# Patient Record
Sex: Male | Born: 1945
Health system: Southern US, Community
[De-identification: ages and names within clinical notes are randomized; demographics above are authoritative.]

## PROBLEM LIST (undated history)

## (undated) DIAGNOSIS — K219 Gastro-esophageal reflux disease without esophagitis: Secondary | ICD-10-CM

## (undated) DIAGNOSIS — I2510 Atherosclerotic heart disease of native coronary artery without angina pectoris: Secondary | ICD-10-CM

## (undated) DIAGNOSIS — I219 Acute myocardial infarction, unspecified: Secondary | ICD-10-CM

## (undated) DIAGNOSIS — Z972 Presence of dental prosthetic device (complete) (partial): Secondary | ICD-10-CM

## (undated) DIAGNOSIS — N400 Enlarged prostate without lower urinary tract symptoms: Secondary | ICD-10-CM

## (undated) DIAGNOSIS — R0600 Dyspnea, unspecified: Secondary | ICD-10-CM

## (undated) DIAGNOSIS — I10 Essential (primary) hypertension: Secondary | ICD-10-CM

## (undated) DIAGNOSIS — M1990 Unspecified osteoarthritis, unspecified site: Secondary | ICD-10-CM

## (undated) HISTORY — DX: Essential (primary) hypertension: I10

## (undated) HISTORY — PX: EYE SURGERY: SHX253

## (undated) HISTORY — DX: Gastro-esophageal reflux disease without esophagitis: K21.9

---

## 2008-12-29 ENCOUNTER — Ambulatory Visit: Payer: Self-pay | Admitting: Family Medicine

## 2011-06-07 ENCOUNTER — Ambulatory Visit: Payer: Self-pay | Admitting: Internal Medicine

## 2011-06-16 ENCOUNTER — Ambulatory Visit: Payer: Self-pay | Admitting: Internal Medicine

## 2012-03-19 ENCOUNTER — Ambulatory Visit: Payer: Self-pay

## 2012-08-21 ENCOUNTER — Ambulatory Visit: Payer: Self-pay | Admitting: Specialist

## 2012-09-11 ENCOUNTER — Ambulatory Visit: Payer: Self-pay | Admitting: Specialist

## 2012-09-21 ENCOUNTER — Ambulatory Visit: Payer: Self-pay | Admitting: Specialist

## 2012-09-21 HISTORY — PX: KNEE SURGERY: SHX244

## 2013-07-18 ENCOUNTER — Ambulatory Visit: Payer: Self-pay | Admitting: Unknown Physician Specialty

## 2013-11-08 ENCOUNTER — Ambulatory Visit: Payer: Self-pay | Admitting: Medical

## 2014-05-16 IMAGING — CR LEFT LITTLE FINGER 2+V
1 series · 3 of 3 positions shown · non-contrast
Comparison: none

REASON FOR EXAM: Screen for Metal prior to MRI
COMMENTS:

PROCEDURE:     DXR - DXR FINGER PINKY 5TH DIGIT LT HA  - August 21, 2012 [DATE]
RESULT:     Comparison:  None

[Series 1: x finger pa left · 0.14mm/px · 3 of 3 slices shown]
[im 1/3]
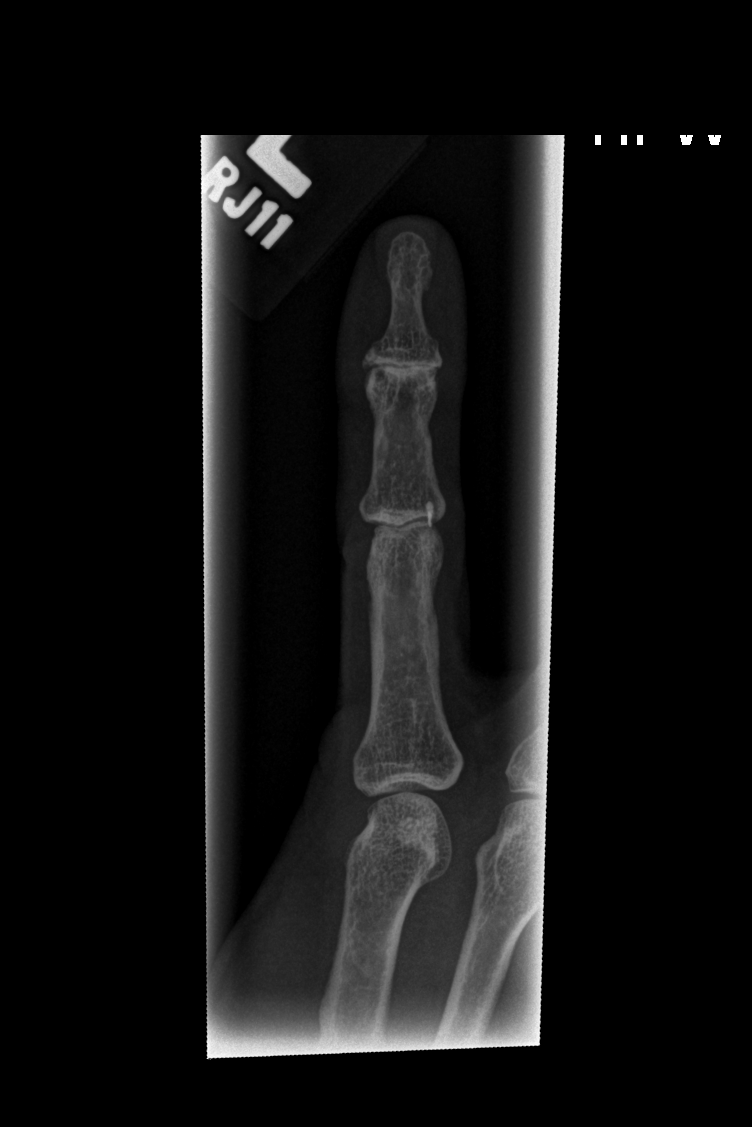
[im 2/3]
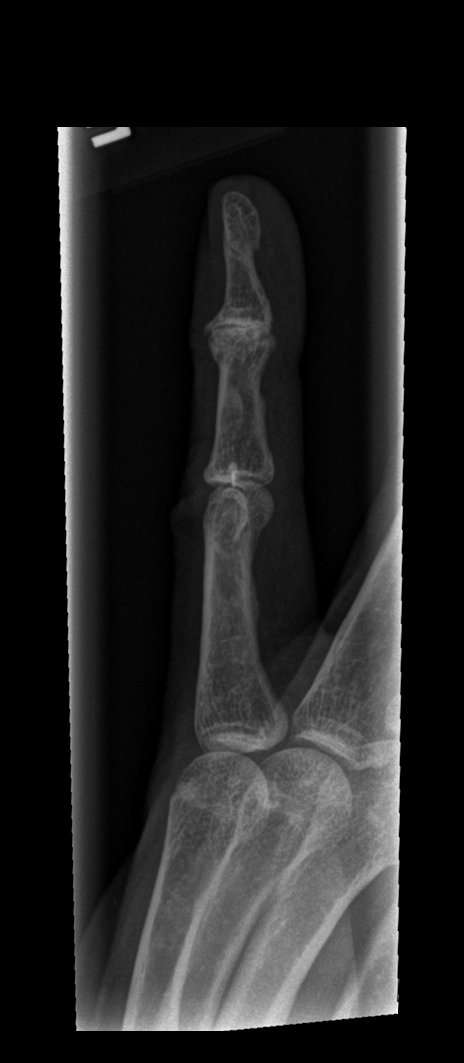
[im 3/3]
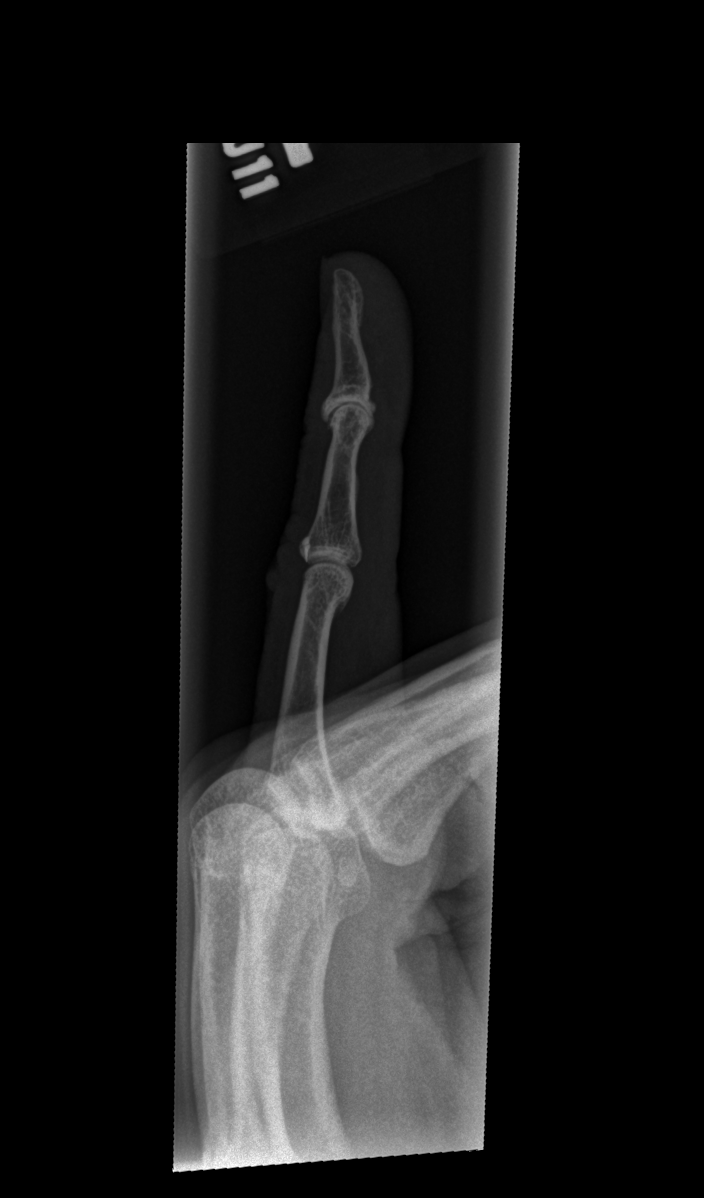

[3 of 3 positions shown; findings below may reference images not displayed]

FINDINGS: Three coned-down views of the left fifth digit demonstrates no fracture or
dislocation. There is a tiny metallic foreign body along the dorsal aspect
of the left fifth PIP joint which should not preclude the patient from MRI.
There are degenerative changes of the DIP joint.
IMPRESSION: Please see above.

[REDACTED]

## 2015-02-03 NOTE — Op Note (Signed)
PATIENT NAME:  Barry [name], Barry [name] MR#:  [id number] DATE OF BIRTH:  04-Aug-1946  DATE OF PROCEDURE:  09/21/2012  PREOPERATIVE DIAGNOSIS: Tear posterior horn medial meniscus, right knee.   POSTOPERATIVE DIAGNOSIS: Tear posterior horn medial meniscus, right knee.   PROCEDURE: Arthroscopic partial medial meniscectomy, right knee.   SURGEON: Christophe Louis, M.D.   ANESTHESIA: General.   COMPLICATIONS: None.   PROCEDURE: After adequate induction of general anesthesia, the right lower extremity is secured in the legholder in the usual manner for arthroscopy. The knee and leg are thoroughly prepped with alcohol and ChloraPrep and draped in standard sterile fashion. The joint is infiltrated with 10 mL of Marcaine with epinephrine. Diagnostic arthroscopy is performed. There is a moderate joint effusion present. The suprapatellar pouch shows some mild increased synovitis and this is removed with the 50-degree ArthroWand. The patellofemoral articulation is normal. In the medial compartment there is significant chondromalacia of the weight-bearing surface of the medial femoral condyle. There is a complex tear of the posterior horn of the medial meniscus. The probe is used to fully define the tear. The full radial resector and the ArthroWand are used to resect the meniscus back to a stable rim. In the intercondylar notch, there is increased synovial hypertrophy and this synovium is removed. The anterior cruciate ligament is normal. The lateral compartment is normal. The joint is thoroughly irrigated multiple times. Skin edges are closed with 5-0 nylon. The joint is infiltrated with 10 mL of Marcaine with epinephrine with 4 mg of morphine. A soft bulky dressing is applied. The patient is returned to the recovery room in satisfactory condition having tolerated the procedure quite well.    ____________________________ Lucas Mallow, MD ces:bjt D:  09/21/2012 09:21:51 ET           T: 09/21/2012 10:27:09  ET JOB#: [id number] Lucas Mallow MD ELECTRONICALLY SIGNED 09/22/2012 11:09

## 2015-02-13 ENCOUNTER — Ambulatory Visit
Admit: 2015-02-13 | Disposition: A | Discharge status: Home / Self Care | Payer: Self-pay | Attending: Family Medicine | Admitting: Family Medicine

## 2015-02-13 LAB — URINALYSIS, COMPLETE
BACTERIA: NEGATIVE
BILIRUBIN, UR: NEGATIVE
Dysmorphic Rbc: NONE SEEN
Glucose,UR: NEGATIVE
Ketone: NEGATIVE
Leukocyte Esterase: NEGATIVE
NITRITE: NEGATIVE
Ph: 5 (ref 5.0–8.0)
Protein: NEGATIVE
Renal Epithelial: NONE SEEN
SQUAMOUS EPITHELIAL: NONE SEEN
TRANSITIONAL EPI: NONE SEEN
WBC CLUMP: NONE SEEN

## 2015-02-16 DIAGNOSIS — R319 Hematuria, unspecified: Secondary | ICD-10-CM | POA: Insufficient documentation

## 2015-02-16 DIAGNOSIS — N401 Enlarged prostate with lower urinary tract symptoms: Secondary | ICD-10-CM | POA: Insufficient documentation

## 2015-02-16 DIAGNOSIS — R3913 Splitting of urinary stream: Secondary | ICD-10-CM | POA: Insufficient documentation

## 2015-02-16 DIAGNOSIS — R3911 Hesitancy of micturition: Secondary | ICD-10-CM | POA: Insufficient documentation

## 2015-04-08 ENCOUNTER — Encounter: Payer: Self-pay | Admitting: Family Medicine

## 2015-04-08 ENCOUNTER — Ambulatory Visit: Payer: Self-pay | Admitting: Family Medicine

## 2015-04-08 ENCOUNTER — Ambulatory Visit (INDEPENDENT_AMBULATORY_CARE_PROVIDER_SITE_OTHER): Payer: PPO | Admitting: Family Medicine

## 2015-04-08 VITALS — BP 120/65 | HR 67 | Temp 98.0°F | Resp 16 | Ht 69.0 in | Wt 184.4 lb

## 2015-04-08 DIAGNOSIS — K649 Unspecified hemorrhoids: Secondary | ICD-10-CM | POA: Insufficient documentation

## 2015-04-08 DIAGNOSIS — N410 Acute prostatitis: Secondary | ICD-10-CM | POA: Insufficient documentation

## 2015-04-08 DIAGNOSIS — H6120 Impacted cerumen, unspecified ear: Secondary | ICD-10-CM | POA: Insufficient documentation

## 2015-04-08 DIAGNOSIS — K219 Gastro-esophageal reflux disease without esophagitis: Secondary | ICD-10-CM | POA: Insufficient documentation

## 2015-04-08 DIAGNOSIS — I10 Essential (primary) hypertension: Secondary | ICD-10-CM | POA: Diagnosis not present

## 2015-04-08 DIAGNOSIS — E1169 Type 2 diabetes mellitus with other specified complication: Secondary | ICD-10-CM | POA: Insufficient documentation

## 2015-04-08 DIAGNOSIS — R7309 Other abnormal glucose: Secondary | ICD-10-CM

## 2015-04-08 DIAGNOSIS — E785 Hyperlipidemia, unspecified: Secondary | ICD-10-CM

## 2015-04-08 DIAGNOSIS — R7303 Prediabetes: Secondary | ICD-10-CM

## 2015-04-08 DIAGNOSIS — R600 Localized edema: Secondary | ICD-10-CM

## 2015-04-08 DIAGNOSIS — H9190 Unspecified hearing loss, unspecified ear: Secondary | ICD-10-CM | POA: Insufficient documentation

## 2015-04-08 DIAGNOSIS — Z8042 Family history of malignant neoplasm of prostate: Secondary | ICD-10-CM | POA: Insufficient documentation

## 2015-04-08 MED ORDER — AMLODIPINE BESYLATE 5 MG PO TABS
5.0000 mg | ORAL_TABLET | Freq: Every day | ORAL | Status: DC
Start: 2015-04-08 — End: 2015-06-16

## 2015-04-08 MED ORDER — ATENOLOL 50 MG PO TABS: ORAL_TABLET | ORAL | Status: DC

## 2015-04-08 NOTE — Progress Notes (Signed)
Name: Barry [name]   MRN: [id number]    DOB: 06-02-1946   Date:04/08/2015       Progress Note  Subjective  Chief Complaint  Chief Complaint  Patient presents with  . Hypertension    Leg swelling x 4 days    HPI  Feet swelling for abut 10 days.  Amlodipine dose increased abut 6 weeks ago.  Feels lightheaded still, for a sec. Or 2 after standing.  Otgher wise feels ok. Found to be pre-diabetic on labs. Found to have microscopic hematuria by Dr. Bernardo Heater.  Needs f/u at his PE in July.   Past Medical History  Diagnosis Date  . Hypertension   . Hyperlipidemia   . GERD (gastroesophageal reflux disease)     History  Substance Use Topics  . Smoking status: Never Smoker   . Smokeless tobacco: Former Systems developer  . Alcohol Use: No     Current outpatient prescriptions:  .  aspirin 81 MG chewable tablet, Chew by mouth., Disp: , Rfl:  .  atenolol (TENORMIN) 50 MG tablet, Take 1/2 tablet each AM, Disp: 90 tablet, Rfl: 3 .  MULTIPLE VITAMIN PO, Take by mouth., Disp: , Rfl:  .  ranitidine (ZANTAC) 150 MG capsule, Take by mouth., Disp: , Rfl:  .  silodosin (RAPAFLO) 8 MG CAPS capsule, Take by mouth., Disp: , Rfl:  .  simvastatin (ZOCOR) 20 MG tablet, Take by mouth., Disp: , Rfl:  .  amLODipine (NORVASC) 5 MG tablet, Take 1 tablet (5 mg total) by mouth daily., Disp: 90 tablet, Rfl: 3  No Known Allergies  Review of Systems  Constitutional: Negative.  Negative for fever, chills and malaise/fatigue.  HENT: Negative.   Eyes: Negative.   Respiratory: Negative for cough, sputum production, shortness of breath and wheezing.   Cardiovascular: Positive for leg swelling. Negative for chest pain, palpitations and orthopnea.  Gastrointestinal: Negative.  Negative for heartburn and abdominal pain.  Genitourinary: Hematuria: microscopic.  Musculoskeletal: Negative.  Negative for myalgias and joint pain.  Skin: Negative.   Neurological: Negative for weakness and headaches.       Objective  Filed Vitals:   04/08/15 1010 04/08/15 1044  BP: 127/72 120/65  Pulse: 67   Temp: 98 F (36.7 C)   Resp: 16   Height: 5\' 9"  (1.753 m)   Weight: 184 lb 6.4 oz (83.643 kg)      Physical Exam  Constitutional: He is well-developed, well-nourished, and in no distress. No distress.  HENT:  Head: Normocephalic and atraumatic.  Neck: Normal range of motion. Neck supple. No thyromegaly present.  Cardiovascular: Normal rate, regular rhythm, normal heart sounds and intact distal pulses.  Exam reveals no gallop and no friction rub.   No murmur heard. Pulmonary/Chest: Effort normal and breath sounds normal. No respiratory distress. He has no wheezes. He has no rales.  Musculoskeletal: He exhibits edema.       Right ankle: He exhibits swelling.       Left ankle: He exhibits swelling.       Right lower leg: He exhibits swelling.       Left lower leg: He exhibits swelling.       Right foot: There is swelling.       Left foot: There is swelling.  Lymphadenopathy:    He has no cervical adenopathy.  Vitals reviewed.     Recent Results (from the past 2160 hour(s))  Urinalysis, Complete     Status: None   Collection Time: 02/13/15  1:56  PM  Result Value Ref Range   Color - urine YELLOW    Clarity - urine CLEAR    Glucose,UR NEGATIVE NEGATIVE   Bilirubin,UR NEGATIVE NEGATIVE   Ketone NEGATIVE NEGATIVE   Specific Gravity >=1.030 1.000-1.030   Blood TRACE NEGATIVE   Ph 5.0 5.0-8.0   Protein NEGATIVE NEGATIVE   Nitrite NEGATIVE NEGATIVE   Leukocyte Esterase NEGATIVE NEGATIVE   RBC,UR 0-5 0-5 /HPF   WBC UR 5-15 0-5 /HPF   Bacteria NEGATIVE NONE SEEN   Squamous Epithelial NONE SEEN    Transitional Epi NONE SEEN    Renal Epithelial NONE SEEN    WBC Clump NONE SEEN    Dysmorphic Rbc NONE SEEN    Mucous PRESENT    Hyaline Cast 0-5 / LPF    Amorphous Crystal PRESENT      Assessment & Plan    1. Pre-diabetes Decrease sugars and starches in diet.  2.  Essential (primary) hypertension  - atenolol (TENORMIN) 50 MG tablet; Take 1/2 tablet each AM  Dispense: 90 tablet; Refill: 3 - amLODipine (NORVASC) 5 MG tablet; Take 1 tablet (5 mg total) by mouth daily.  Dispense: 90 tablet; Refill: 3  3. Pedal edema

## 2015-05-01 ENCOUNTER — Ambulatory Visit: Payer: Self-pay | Admitting: Family Medicine

## 2015-05-19 ENCOUNTER — Ambulatory Visit (INDEPENDENT_AMBULATORY_CARE_PROVIDER_SITE_OTHER): Payer: PPO | Admitting: Family Medicine

## 2015-05-19 ENCOUNTER — Encounter: Payer: Self-pay | Admitting: Family Medicine

## 2015-05-19 VITALS — BP 136/72 | HR 65 | Resp 16 | Ht 69.0 in | Wt 182.6 lb

## 2015-05-19 DIAGNOSIS — Z0000 Encounter for general adult medical examination without abnormal findings: Secondary | ICD-10-CM

## 2015-05-19 NOTE — Progress Notes (Signed)
Subjective:    Barry [name] is a 69 y.o. male who presents for Medicare Annual/Subsequent preventive examination.   Preventive Screening-Counseling & Management  Tobacco History  Smoking status  . Never Smoker   Smokeless tobacco  . Former Systems developer  . Types: Chew    Problems Prior to Visit 1.   Current Problems (verified) Patient Active Problem List   Diagnosis Date Noted  . Acute prostatitis 04/08/2015  . Cerumen impaction 04/08/2015  . Essential (primary) hypertension 04/08/2015  . Difficulty hearing 04/08/2015  . Gastric reflux 04/08/2015  . Family history of malignant neoplasm of prostate 04/08/2015  . Hemorrhoid 04/08/2015  . Hypercholesteremia 04/08/2015  . HLD (hyperlipidemia) 04/08/2015  . Blood glucose elevated 04/08/2015  . Pre-diabetes 04/08/2015  . Pedal edema 04/08/2015  . Blood in the urine 02/16/2015    Medications Prior to Visit Current Outpatient Prescriptions on File Prior to Visit  Medication Sig Dispense Refill  . amLODipine (NORVASC) 5 MG tablet Take 1 tablet (5 mg total) by mouth daily. 90 tablet 3  . aspirin 81 MG chewable tablet Chew by mouth.    Marland Kitchen atenolol (TENORMIN) 50 MG tablet Take 1/2 tablet each AM 90 tablet 3  . MULTIPLE VITAMIN PO Take by mouth.    . ranitidine (ZANTAC) 150 MG capsule Take by mouth.    . silodosin (RAPAFLO) 8 MG CAPS capsule Take by mouth.    . simvastatin (ZOCOR) 20 MG tablet Take by mouth.     No current facility-administered medications on file prior to visit.    Current Medications (verified) Current Outpatient Prescriptions  Medication Sig Dispense Refill  . amLODipine (NORVASC) 5 MG tablet Take 1 tablet (5 mg total) by mouth daily. 90 tablet 3  . aspirin 81 MG chewable tablet Chew by mouth.    Marland Kitchen atenolol (TENORMIN) 50 MG tablet Take 1/2 tablet each AM 90 tablet 3  . MULTIPLE VITAMIN PO Take by mouth.    . ranitidine (ZANTAC) 150 MG capsule Take by mouth.    . silodosin (RAPAFLO) 8 MG CAPS capsule  Take by mouth.    . simvastatin (ZOCOR) 20 MG tablet Take by mouth.     No current facility-administered medications for this visit.     Allergies (verified) Review of patient's allergies indicates no known allergies.   PAST HISTORY  Family History Family History  Problem Relation Age of Onset  . Diabetes Mother   . Heart attack Mother   . Cancer Brother 69    prostate   . Hyperlipidemia Paternal Aunt     Social History History  Substance Use Topics  . Smoking status: Never Smoker   . Smokeless tobacco: Former Systems developer    Types: Chew  . Alcohol Use: No    Are there smokers in your home (other than you)? No Risk Factors Current exercise habits: Exercise is limited by orthopedic condition(s): knee pain.  Dietary issues discussed: None   Cardiac risk factors: advanced age (older than 14 for men, 23 for women), hypertension, male gender and sedentary lifestyle.  Depression Screen (Note: if answer to either of the following is "Yes", a more complete depression screening is indicated)   Q1: Over the past two weeks, have you felt down, depressed or hopeless? No  Q2: Over the past two weeks, have you felt little interest or pleasure in doing things? NO  Have you lost interest or pleasure in daily life? No  Do you often feel hopeless? No  Do you cry easily over  simple problems? No  Activities of Daily Living In your present state of health, do you have any difficulty performing the following activities?:  Driving?No Managing money?  No Feeding yourself? No Getting from bed to chair? No Climbing a flight of stairs? Yes Preparing food and eating?: No Bathing or showering? No Getting dressed: No Getting to the toilet? No Using the toilet:No Moving around from place to place: No In the past year have you fallen or had a near fall?:No   Are you sexually active?  Yes  Do you have more than one partner?  No  Hearing Difficulties: No Do you often ask people to speak up or  repeat themselves? No Do you experience ringing or noises in your ears? No Do you have difficulty understanding soft or whispered voices? No   Do you feel that you have a problem with memory? No  Do you often misplace items? No  Do you feel safe at home?  No  Cognitive Testing  Alert? No  Normal Appearance?No  Oriented to person? No  Place? No   Time? No  Recall of three objects?  No  Can perform simple calculations? No  Displays appropriate judgment?No  Can read the correct time from a watch face?No   Advanced Directives have been discussed with the patient? No   List the Names of Other Physician/Practitioners you currently use: 1. Dr. Ellin Mayhew- Eye Doctor   Indicate any recent Medical Services you may have received from other than [facility] providers in the past year (date may be approximate).  Immunization History  Administered Date(s) Administered  . Influenza-Unspecified 06/17/2014  . Pneumococcal Conjugate-13 04/16/2014  . Pneumococcal-Unspecified 09/17/2011  . Tdap 05/17/2008    Screening Tests Health Maintenance  Topic Date Due  . PNA vac Low Risk Adult (2 of 2 - PPSV23) 05/08/2015  . INFLUENZA VACCINE  05/18/2015  . TETANUS/TDAP  10/17/2017  . COLONOSCOPY  06/29/2024  . ZOSTAVAX  Completed    All answers were reviewed with the patient and necessary referrals were made:  Shalayne Leach L Hadas Jessop, NP   05/19/2015   History reviewed: past family history, past social history and past surgical history  Review of Systems A comprehensive review of systems was negative except for: Cardiovascular: positive for ankle swelling.    Objective:     Vision by Snellen chart: right eye:20/25, left eye:20/20 Blood pressure 136/72, pulse 65, resp. rate 16, height 5\' 9"  (1.753 m), weight 182 lb 9.6 oz (82.827 kg). Body mass index is 26.95 kg/(m^2).       Assessment:     Subsequent Annual Medicare Visit      Plan:     During the course of the visit the patient was educated and  counseled about appropriate screening and preventive services including:    Colorectal cancer screening  Diabetes screening  Advanced directives: has NO advanced directive  - add't info requested. Referral to SW: not applicable  Diet review for nutrition referral? Yes ____  Not Indicated ____   Patient Instructions (the written plan) was given to the patient.  Medicare Attestation I have personally reviewed: The patient's medical and social history Their use of alcohol, tobacco or illicit drugs Their current medications and supplements The patient's functional ability including ADLs,fall risks, home safety risks, cognitive, and hearing and visual impairment Diet and physical activities Evidence for depression or mood disorders  The patient's weight, height, BMI, and visual acuity have been recorded in the chart.  I have made referrals, counseling,  and provided education to the patient based on review of the above and I have provided the patient with a written personalized care plan for preventive services.     Korby Ratay L Suhaib Guzzo, NP   05/19/2015

## 2015-05-19 NOTE — Patient Instructions (Addendum)

## 2015-06-16 ENCOUNTER — Ambulatory Visit (INDEPENDENT_AMBULATORY_CARE_PROVIDER_SITE_OTHER): Payer: PPO | Admitting: Family Medicine

## 2015-06-16 ENCOUNTER — Encounter: Payer: Self-pay | Admitting: Family Medicine

## 2015-06-16 VITALS — BP 150/75 | HR 66 | Temp 97.8°F | Resp 16 | Ht 69.0 in | Wt 183.0 lb

## 2015-06-16 DIAGNOSIS — I10 Essential (primary) hypertension: Secondary | ICD-10-CM

## 2015-06-16 MED ORDER — HYDROCHLOROTHIAZIDE 12.5 MG PO TABS: 12.5000 mg | ORAL_TABLET | Freq: Every day | ORAL | Status: DC

## 2015-06-16 NOTE — Progress Notes (Signed)
Name: Barry [name]   MRN: [id number]    DOB: 03/25/1946   Date:06/16/2015       Progress Note  Subjective  Chief Complaint  Chief Complaint  Patient presents with  . Hypertension    Pt c/o of getting very hot.     HPI  For f/u of HBP.  Ankle swelling has resolved off Amlodipine.  Still taking Atenolol, Simvastatin, and  Rapaflo.  Urind flo ok.  No problems noted.  Reports BPs at home 120-130s/75-80 No problem-specific assessment & plan notes found for this encounter.   Past Medical History  Diagnosis Date  . Hypertension   . Hyperlipidemia   . GERD (gastroesophageal reflux disease)     Social History  Substance Use Topics  . Smoking status: Never Smoker   . Smokeless tobacco: Former Systems developer    Types: Chew  . Alcohol Use: No     Current outpatient prescriptions:  .  aspirin 81 MG tablet, Take 81 mg by mouth 2 (two) times daily., Disp: , Rfl:  .  atenolol (TENORMIN) 50 MG tablet, Take 1/2 tablet each AM, Disp: 90 tablet, Rfl: 3 .  MULTIPLE VITAMIN PO, Take by mouth., Disp: , Rfl:  .  ranitidine (ZANTAC) 150 MG capsule, Take 150 mg by mouth daily as needed. , Disp: , Rfl:  .  silodosin (RAPAFLO) 8 MG CAPS capsule, Take by mouth., Disp: , Rfl:  .  simvastatin (ZOCOR) 20 MG tablet, Take 20 mg by mouth daily. , Disp: , Rfl:   No Known Allergies  Review of Systems  Constitutional: Negative for fever, chills, weight loss and malaise/fatigue.  HENT: Negative for hearing loss.   Eyes: Negative for blurred vision and double vision.  Respiratory: Negative for cough, sputum production, shortness of breath and wheezing.   Cardiovascular: Negative for chest pain, palpitations, orthopnea and leg swelling.  Gastrointestinal: Negative for heartburn, nausea, vomiting, abdominal pain, diarrhea and blood in stool.  Genitourinary: Negative for dysuria, urgency and frequency.  Skin: Negative for rash.  Neurological: Negative for dizziness and headaches.      Objective  Filed  Vitals:   06/16/15 1116  BP: 154/82  Pulse: 78  Temp: 97.8 F (36.6 C)  TempSrc: Oral  Resp: 16  Height: 5\' 9"  (1.753 m)  Weight: 183 lb (83.008 kg)     Physical Exam  Constitutional: He is well-developed, well-nourished, and in no distress. No distress.  HENT:  Head: Normocephalic and atraumatic.  Eyes: Conjunctivae and EOM are normal. Pupils are equal, round, and reactive to light. No scleral icterus.  Neck: Normal range of motion. Neck supple. Carotid bruit is not present. No thyromegaly present.  Cardiovascular: Normal rate, regular rhythm, normal heart sounds and intact distal pulses.  Exam reveals no gallop and no friction rub.   No murmur heard. Pulmonary/Chest: Effort normal and breath sounds normal. No respiratory distress. He has no wheezes. He has no rales.  Abdominal: Soft. Bowel sounds are normal. He exhibits no distension and no mass. There is no tenderness.  Musculoskeletal: He exhibits no edema.  Lymphadenopathy:    He has no cervical adenopathy.  Vitals reviewed.     No results found for this or any previous visit (from the past 2160 hour(s)).   Assessment & Plan  1. Essential (primary) hypertension  - hydrochlorothiazide (HYDRODIURIL) 12.5 MG tablet; Take 1 tablet (12.5 mg total) by mouth daily.  Dispense: 30 tablet; Refill: 6

## 2015-07-28 ENCOUNTER — Ambulatory Visit (INDEPENDENT_AMBULATORY_CARE_PROVIDER_SITE_OTHER): Payer: PPO | Admitting: Family Medicine

## 2015-07-28 ENCOUNTER — Encounter: Payer: Self-pay | Admitting: Family Medicine

## 2015-07-28 VITALS — BP 125/75 | HR 66 | Resp 16 | Ht 69.0 in | Wt 187.0 lb

## 2015-07-28 DIAGNOSIS — K219 Gastro-esophageal reflux disease without esophagitis: Secondary | ICD-10-CM | POA: Diagnosis not present

## 2015-07-28 DIAGNOSIS — I10 Essential (primary) hypertension: Secondary | ICD-10-CM

## 2015-07-28 DIAGNOSIS — Z23 Encounter for immunization: Secondary | ICD-10-CM | POA: Diagnosis not present

## 2015-07-28 DIAGNOSIS — R61 Generalized hyperhidrosis: Secondary | ICD-10-CM | POA: Diagnosis not present

## 2015-07-28 DIAGNOSIS — E785 Hyperlipidemia, unspecified: Secondary | ICD-10-CM

## 2015-07-28 DIAGNOSIS — IMO0001 Reserved for inherently not codable concepts without codable children: Secondary | ICD-10-CM

## 2015-07-28 NOTE — Progress Notes (Signed)
Name: Barry [name]   MRN: [id number]    DOB: 02-03-1946   Date:07/28/2015       Progress Note  Subjective  Chief Complaint  Chief Complaint  Patient presents with  . Hypertension    Dizzy and Sweating     HPI  Here for f/u of HBP.  Feeling well.  Taking meds.    No problem-specific assessment & plan notes found for this encounter.   Past Medical History  Diagnosis Date  . Hypertension   . Hyperlipidemia   . GERD (gastroesophageal reflux disease)     Social History  Substance Use Topics  . Smoking status: Never Smoker   . Smokeless tobacco: Former Systems developer    Types: Chew  . Alcohol Use: No     Current outpatient prescriptions:  .  atenolol (TENORMIN) 50 MG tablet, Take 1/2 tablet each AM, Disp: 90 tablet, Rfl: 3 .  hydrochlorothiazide (HYDRODIURIL) 12.5 MG tablet, Take 1 tablet (12.5 mg total) by mouth daily., Disp: 30 tablet, Rfl: 6 .  MULTIPLE VITAMIN PO, Take by mouth., Disp: , Rfl:  .  ranitidine (ZANTAC) 150 MG capsule, Take 150 mg by mouth daily as needed. , Disp: , Rfl:  .  silodosin (RAPAFLO) 8 MG CAPS capsule, Take by mouth., Disp: , Rfl:  .  simvastatin (ZOCOR) 20 MG tablet, Take 20 mg by mouth daily. , Disp: , Rfl:  .  aspirin 81 MG tablet, Take 81 mg by mouth 2 (two) times daily., Disp: , Rfl:   No Known Allergies  Review of Systems  Constitutional: Negative for fever, chills, weight loss and malaise/fatigue.  HENT: Negative for hearing loss.   Eyes: Negative for blurred vision and double vision.  Respiratory: Negative for cough, shortness of breath and wheezing.   Cardiovascular: Negative for chest pain, palpitations, orthopnea and leg swelling.  Gastrointestinal: Negative for heartburn, abdominal pain and blood in stool.  Genitourinary: Negative for dysuria, urgency and frequency.  Musculoskeletal: Negative for myalgias and joint pain.  Neurological: Negative for dizziness, tremors, weakness and headaches.      Objective  Filed Vitals:   07/28/15 1118 07/28/15 1146  BP: 162/93 138/77  Pulse: 66   Resp: 16   Height: 5\' 9"  (1.753 m)   Weight: 187 lb (84.823 kg)      Physical Exam  Constitutional: He is oriented to person, place, and time and well-developed, well-nourished, and in no distress. No distress.  HENT:  Head: Normocephalic and atraumatic.  Eyes: Conjunctivae and EOM are normal. Pupils are equal, round, and reactive to light. No scleral icterus.  Neck: Normal range of motion. Carotid bruit is not present. No thyromegaly present.  Cardiovascular: Normal rate, regular rhythm, normal heart sounds and intact distal pulses.  Exam reveals no gallop and no friction rub.   No murmur heard. Pulmonary/Chest: Effort normal and breath sounds normal. No respiratory distress. He has no wheezes. He has no rales.  Abdominal: Soft. Bowel sounds are normal. He exhibits no distension and no abdominal bruit. There is no tenderness. There is no rebound.  Musculoskeletal: He exhibits no edema.  Lymphadenopathy:    He has no cervical adenopathy.  Neurological: He is alert and oriented to person, place, and time.  Vitals reviewed.     No results found for this or any previous visit (from the past 2160 hour(s)).   Assessment & Plan  1. Essential (primary) hypertension  - Comprehensive Metabolic Panel (CMET)  2. Gastric reflux  - CBC with Differential  3. HLD (hyperlipidemia)  - Lipid Profile  4. Sweating  - TSH  5. Need for influenza vaccination  - Flu vaccine HIGH DOSE PF (Fluzone High dose)

## 2015-07-28 NOTE — Patient Instructions (Signed)
Continue current meds 

## 2015-09-16 ENCOUNTER — Other Ambulatory Visit: Payer: Self-pay | Admitting: Family Medicine

## 2015-09-16 ENCOUNTER — Telehealth: Payer: Self-pay | Admitting: Family Medicine

## 2015-09-16 MED ORDER — SIMVASTATIN 20 MG PO TABS: 20.0000 mg | ORAL_TABLET | Freq: Every day | ORAL | Status: DC

## 2015-09-16 NOTE — Telephone Encounter (Signed)
Pt have called back request that his med  simvastain be for a 90 day supply not  30 days

## 2015-09-16 NOTE — Telephone Encounter (Signed)
I have refilled.-jh 

## 2015-09-16 NOTE — Telephone Encounter (Signed)
Done

## 2015-09-16 NOTE — Telephone Encounter (Signed)
Done. Sent to pharmacy on file. Pt made aware.

## 2015-09-16 NOTE — Telephone Encounter (Signed)
Pt called need a refill on  Simvastain 20 mg  [employer]

## 2015-11-23 DIAGNOSIS — Z79899 Other long term (current) drug therapy: Secondary | ICD-10-CM | POA: Diagnosis not present

## 2015-11-23 DIAGNOSIS — N401 Enlarged prostate with lower urinary tract symptoms: Secondary | ICD-10-CM | POA: Diagnosis not present

## 2015-11-23 DIAGNOSIS — I10 Essential (primary) hypertension: Secondary | ICD-10-CM | POA: Diagnosis not present

## 2015-11-30 DIAGNOSIS — R61 Generalized hyperhidrosis: Secondary | ICD-10-CM | POA: Diagnosis not present

## 2015-11-30 DIAGNOSIS — K219 Gastro-esophageal reflux disease without esophagitis: Secondary | ICD-10-CM | POA: Diagnosis not present

## 2015-11-30 DIAGNOSIS — I10 Essential (primary) hypertension: Secondary | ICD-10-CM | POA: Diagnosis not present

## 2015-11-30 DIAGNOSIS — E785 Hyperlipidemia, unspecified: Secondary | ICD-10-CM | POA: Diagnosis not present

## 2015-12-01 LAB — LIPID PANEL
CHOL/HDL RATIO: 3.6 ratio (ref 0.0–5.0)
Cholesterol, Total: 154 mg/dL (ref 100–199)
HDL: 43 mg/dL (ref >39)
LDL CALC: 93 mg/dL (ref 0–99)
Triglycerides: 90 mg/dL (ref 0–149)
VLDL Cholesterol Cal: 18 mg/dL (ref 5–40)

## 2015-12-01 LAB — CBC WITH DIFFERENTIAL/PLATELET
BASOS ABS: 0 10*3/uL (ref 0.0–0.2)
BASOS: 0 %
EOS (ABSOLUTE): 0.2 10*3/uL (ref 0.0–0.4)
Eos: 3 %
HEMATOCRIT: 42.5 % (ref 37.5–51.0)
HEMOGLOBIN: 15.1 g/dL (ref 12.6–17.7)
Immature Grans (Abs): 0 10*3/uL (ref 0.0–0.1)
Immature Granulocytes: 0 %
LYMPHS ABS: 2.1 10*3/uL (ref 0.7–3.1)
Lymphs: 32 %
MCH: 31.2 pg (ref 26.6–33.0)
MCHC: 35.5 g/dL (ref 31.5–35.7)
MCV: 88 fL (ref 79–97)
Monocytes Absolute: 0.4 10*3/uL (ref 0.1–0.9)
Monocytes: 5 %
NEUTROS ABS: 4 10*3/uL (ref 1.4–7.0)
Neutrophils: 60 %
Platelets: 241 10*3/uL (ref 150–379)
RBC: 4.84 x10E6/uL (ref 4.14–5.80)
RDW: 12.4 % (ref 12.3–15.4)
WBC: 6.7 10*3/uL (ref 3.4–10.8)

## 2015-12-01 LAB — COMPREHENSIVE METABOLIC PANEL
ALBUMIN: 4.3 g/dL (ref 3.6–4.8)
ALT: 23 IU/L (ref 0–44)
AST: 23 IU/L (ref 0–40)
Albumin/Globulin Ratio: 2 (ref 1.1–2.5)
Alkaline Phosphatase: 90 IU/L (ref 39–117)
BUN/Creatinine Ratio: 18 (ref 10–22)
BUN: 15 mg/dL (ref 8–27)
Bilirubin Total: 0.3 mg/dL (ref 0.0–1.2)
CALCIUM: 9.3 mg/dL (ref 8.6–10.2)
CO2: 24 mmol/L (ref 18–29)
CREATININE: 0.82 mg/dL (ref 0.76–1.27)
Chloride: 102 mmol/L (ref 96–106)
GFR, EST AFRICAN AMERICAN: 104 mL/min/{1.73_m2} (ref >59)
GFR, EST NON AFRICAN AMERICAN: 90 mL/min/{1.73_m2} (ref >59)
GLOBULIN, TOTAL: 2.1 g/dL (ref 1.5–4.5)
Glucose: 110 mg/dL — ABNORMAL HIGH (ref 65–99)
Potassium: 4.1 mmol/L (ref 3.5–5.2)
SODIUM: 141 mmol/L (ref 134–144)
Total Protein: 6.4 g/dL (ref 6.0–8.5)

## 2015-12-01 LAB — TSH: TSH: 1.17 u[IU]/mL (ref 0.450–4.500)

## 2015-12-07 ENCOUNTER — Encounter: Payer: Self-pay | Admitting: Family Medicine

## 2015-12-07 ENCOUNTER — Ambulatory Visit (INDEPENDENT_AMBULATORY_CARE_PROVIDER_SITE_OTHER): Payer: PPO | Admitting: Family Medicine

## 2015-12-07 VITALS — BP 140/80 | HR 61 | Resp 16 | Ht 72.0 in | Wt 189.0 lb

## 2015-12-07 DIAGNOSIS — I10 Essential (primary) hypertension: Secondary | ICD-10-CM | POA: Diagnosis not present

## 2015-12-07 DIAGNOSIS — N410 Acute prostatitis: Secondary | ICD-10-CM

## 2015-12-07 DIAGNOSIS — E7800 Pure hypercholesterolemia, unspecified: Secondary | ICD-10-CM | POA: Diagnosis not present

## 2015-12-07 NOTE — Progress Notes (Signed)
Name: Barry [name]   MRN: [id number]    DOB: 07-30-1946   Date:12/07/2015       Progress Note  Subjective  Chief Complaint  Chief Complaint  Patient presents with  . Hypertension    machine he brought reads 163/96 ours 153/86.    HPI Here to f/u HBP.  BPs at home 115-140 sys, usually in 120s.  His machine checks about 10 points higher than ours in office  Today.  He feelsa well ov erall.  Getting good rep[orts from Dr. Bernardo Heater re: prostate and he may take Rapaflo or not depending on his needs.  No problem-specific assessment & plan notes found for this encounter.   Past Medical History  Diagnosis Date  . Hypertension   . Hyperlipidemia   . GERD (gastroesophageal reflux disease)     Past Surgical History  Procedure Laterality Date  . Knee surgery Right 09/21/2012    Family History  Problem Relation Age of Onset  . Diabetes Mother   . Heart attack Mother   . Cancer Brother 75    prostate   . Hyperlipidemia Paternal Aunt     Social History   Social History  . Marital Status: Married    Spouse Name: N/A  . Number of Children: N/A  . Years of Education: N/A   Occupational History  . Not on file.   Social History Main Topics  . Smoking status: Never Smoker   . Smokeless tobacco: Former Systems developer    Types: Chew  . Alcohol Use: No  . Drug Use: No  . Sexual Activity: Not on file   Other Topics Concern  . Not on file   Social History Narrative     Current outpatient prescriptions:  .  aspirin 81 MG tablet, Take 81 mg by mouth 2 (two) times daily., Disp: , Rfl:  .  atenolol (TENORMIN) 25 MG tablet, , Disp: , Rfl:  .  MULTIPLE VITAMIN PO, Take by mouth., Disp: , Rfl:  .  ranitidine (ZANTAC) 150 MG capsule, Take 150 mg by mouth daily as needed. , Disp: , Rfl:  .  silodosin (RAPAFLO) 8 MG CAPS capsule, Take 8 mg by mouth., Disp: , Rfl:  .  simvastatin (ZOCOR) 20 MG tablet, Take 1 tablet (20 mg total) by mouth daily., Disp: 90 tablet, Rfl: 3  Not on  File   Review of Systems  Constitutional: Negative for fever, chills, weight loss and malaise/fatigue.  HENT: Negative for hearing loss.   Eyes: Negative for blurred vision and double vision.  Respiratory: Negative for cough, shortness of breath and wheezing.   Cardiovascular: Negative for chest pain, palpitations and leg swelling.  Gastrointestinal: Negative for heartburn, abdominal pain and blood in stool.  Genitourinary: Positive for frequency. Negative for dysuria and urgency.       Overall prostate doing better.  Musculoskeletal: Negative for myalgias and joint pain.  Skin: Negative for rash.  Neurological: Negative for dizziness, tremors, weakness and headaches.      Objective  Filed Vitals:   12/07/15 1124 12/07/15 1203  BP: 153/86 140/80  Pulse: 61   Resp: 16   Height: 6' (1.829 m)   Weight: 189 lb (85.73 kg)     Physical Exam  Constitutional: He is oriented to person, place, and time and well-developed, well-nourished, and in no distress. No distress.  HENT:  Head: Normocephalic and atraumatic.  Eyes: Conjunctivae and EOM are normal. Pupils are equal, round, and reactive to light. No scleral icterus.  Neck: Normal range of motion. Neck supple. Carotid bruit is not present. No thyromegaly present.  Cardiovascular: Normal rate, regular rhythm and normal heart sounds.  Exam reveals no gallop and no friction rub.   No murmur heard. Pulmonary/Chest: Effort normal and breath sounds normal. No respiratory distress. He has no wheezes. He has no rales.  Abdominal: Soft. He exhibits no distension, no abdominal bruit and no mass. There is no tenderness.  Musculoskeletal: He exhibits no edema.  Lymphadenopathy:    He has no cervical adenopathy.  Neurological: He is alert and oriented to person, place, and time.  Vitals reviewed.      Recent Results (from the past 2160 hour(s))  Comprehensive Metabolic Panel (CMET)     Status: Abnormal   Collection Time: 11/30/15  9:37  AM  Result Value Ref Range   Glucose 110 (H) 65 - 99 mg/dL   BUN 15 8 - 27 mg/dL   Creatinine, Ser 0.82 0.76 - 1.27 mg/dL   GFR calc non Af Amer 90 >59 mL/min/1.73   GFR calc Af Amer 104 >59 mL/min/1.73   BUN/Creatinine Ratio 18 10 - 22   Sodium 141 134 - 144 mmol/L   Potassium 4.1 3.5 - 5.2 mmol/L   Chloride 102 96 - 106 mmol/L   CO2 24 18 - 29 mmol/L   Calcium 9.3 8.6 - 10.2 mg/dL   Total Protein 6.4 6.0 - 8.5 g/dL   Albumin 4.3 3.6 - 4.8 g/dL   Globulin, Total 2.1 1.5 - 4.5 g/dL   Albumin/Globulin Ratio 2.0 1.1 - 2.5    Comment: **Effective December 28, 2015 the reference interval**   for A/G Ratio will be changing to:              Age                Male          Male           0 -  7 days       1.1 - 2.3       1.1 - 2.3           8 - 30 days       1.2 - 2.8       1.2 - 2.8           1 -  6 months     1.3 - 3.6       1.3 - 3.6    7 months -  5 years      1.5 - 2.6       1.5 - 2.6              > 5 years      1.2 - 2.2       1.2 - 2.2    Bilirubin Total 0.3 0.0 - 1.2 mg/dL   Alkaline Phosphatase 90 39 - 117 IU/L   AST 23 0 - 40 IU/L   ALT 23 0 - 44 IU/L  CBC with Differential     Status: None   Collection Time: 11/30/15  9:37 AM  Result Value Ref Range   WBC 6.7 3.4 - 10.8 x10E3/uL   RBC 4.84 4.14 - 5.80 x10E6/uL   Hemoglobin 15.1 12.6 - 17.7 g/dL   Hematocrit 42.5 37.5 - 51.0 %   MCV 88 79 - 97 fL   MCH 31.2 26.6 - 33.0 pg   MCHC 35.5 31.5 - 35.7 g/dL  RDW 12.4 12.3 - 15.4 %   Platelets 241 150 - 379 x10E3/uL   Neutrophils 60 %   Lymphs 32 %   Monocytes 5 %   Eos 3 %   Basos 0 %   Neutrophils Absolute 4.0 1.4 - 7.0 x10E3/uL   Lymphocytes Absolute 2.1 0.7 - 3.1 x10E3/uL   Monocytes Absolute 0.4 0.1 - 0.9 x10E3/uL   EOS (ABSOLUTE) 0.2 0.0 - 0.4 x10E3/uL   Basophils Absolute 0.0 0.0 - 0.2 x10E3/uL   Immature Granulocytes 0 %   Immature Grans (Abs) 0.0 0.0 - 0.1 x10E3/uL  Lipid Profile     Status: None   Collection Time: 11/30/15  9:37 AM  Result Value Ref Range    Cholesterol, Total 154 100 - 199 mg/dL   Triglycerides 90 0 - 149 mg/dL   HDL 43 >39 mg/dL   VLDL Cholesterol Cal 18 5 - 40 mg/dL   LDL Calculated 93 0 - 99 mg/dL   Chol/HDL Ratio 3.6 0.0 - 5.0 ratio units    Comment:                                   T. Chol/HDL Ratio                                             Men  Women                               1/2 Avg.Risk  3.4    3.3                                   Avg.Risk  5.0    4.4                                2X Avg.Risk  9.6    7.1                                3X Avg.Risk 23.4   11.0   TSH     Status: None   Collection Time: 11/30/15  9:37 AM  Result Value Ref Range   TSH 1.170 0.450 - 4.500 uIU/mL  Hgb A1c w/o eAG     Status: Abnormal   Collection Time: 11/30/15  9:37 AM  Result Value Ref Range   Hgb A1c MFr Bld 6.8 (H) 4.8 - 5.6 %    Comment:          Pre-diabetes: 5.7 - 6.4          Diabetes: >6.4          Glycemic control for adults with diabetes: <7.0   Specimen status report     Status: None (Preliminary result)   Collection Time: 11/30/15  9:37 AM  Result Value Ref Range   specimen status report Comment     Comment: Written Authorization Written Authorization      Assessment & Plan  Problem List Items Addressed This Visit      Cardiovascular and Mediastinum   Essential (primary) hypertension - Primary  Relevant Medications   atenolol (TENORMIN) 25 MG tablet     Genitourinary   Acute prostatitis     Other   Hypercholesteremia   Relevant Medications   atenolol (TENORMIN) 25 MG tablet      Meds ordered this encounter  Medications  . silodosin (RAPAFLO) 8 MG CAPS capsule    Sig: Take 8 mg by mouth.  Marland Kitchen atenolol (TENORMIN) 25 MG tablet    Sig:    1. Essential (primary) hypertension Cont. Atenolol  2. Hypercholesteremia Cont. Simvastatin  3. Acute prostatitis Use Rapaflo as needed.

## 2015-12-10 LAB — SPECIMEN STATUS REPORT

## 2015-12-10 LAB — HGB A1C W/O EAG: Hgb A1c MFr Bld: 6.8 % — ABNORMAL HIGH (ref 4.8–5.6)

## 2016-03-25 DIAGNOSIS — M179 Osteoarthritis of knee, unspecified: Secondary | ICD-10-CM | POA: Insufficient documentation

## 2016-03-25 DIAGNOSIS — M1711 Unilateral primary osteoarthritis, right knee: Secondary | ICD-10-CM | POA: Diagnosis not present

## 2016-03-25 DIAGNOSIS — M1710 Unilateral primary osteoarthritis, unspecified knee: Secondary | ICD-10-CM | POA: Insufficient documentation

## 2016-04-01 DIAGNOSIS — H2511 Age-related nuclear cataract, right eye: Secondary | ICD-10-CM | POA: Diagnosis not present

## 2016-04-07 DIAGNOSIS — H2511 Age-related nuclear cataract, right eye: Secondary | ICD-10-CM | POA: Diagnosis not present

## 2016-04-11 ENCOUNTER — Encounter: Payer: Self-pay | Admitting: *Deleted

## 2016-04-12 ENCOUNTER — Encounter: Payer: Self-pay | Admitting: Anesthesiology

## 2016-04-12 ENCOUNTER — Ambulatory Visit: Payer: PPO | Admitting: Anesthesiology

## 2016-04-12 ENCOUNTER — Encounter
Admission: RE | Disposition: A | Discharge status: Home / Self Care | Payer: Self-pay | Source: Ambulatory Visit | Attending: Ophthalmology

## 2016-04-12 ENCOUNTER — Ambulatory Visit
Admission: RE | Admit: 2016-04-12 | Discharge: 2016-04-12 | Disposition: A | Discharge status: Home / Self Care | Payer: PPO | Source: Ambulatory Visit | Attending: Ophthalmology | Admitting: Ophthalmology

## 2016-04-12 DIAGNOSIS — K219 Gastro-esophageal reflux disease without esophagitis: Secondary | ICD-10-CM | POA: Diagnosis not present

## 2016-04-12 DIAGNOSIS — H2511 Age-related nuclear cataract, right eye: Secondary | ICD-10-CM | POA: Insufficient documentation

## 2016-04-12 DIAGNOSIS — M1990 Unspecified osteoarthritis, unspecified site: Secondary | ICD-10-CM | POA: Diagnosis not present

## 2016-04-12 DIAGNOSIS — I10 Essential (primary) hypertension: Secondary | ICD-10-CM | POA: Insufficient documentation

## 2016-04-12 HISTORY — PX: CATARACT EXTRACTION W/PHACO: SHX586

## 2016-04-12 HISTORY — DX: Benign prostatic hyperplasia without lower urinary tract symptoms: N40.0

## 2016-04-12 HISTORY — DX: Unspecified osteoarthritis, unspecified site: M19.90

## 2016-04-12 SURGERY — PHACOEMULSIFICATION, CATARACT, WITH IOL INSERTION
Anesthesia: Monitor Anesthesia Care | Site: Eye | Laterality: Right | Wound class: Clean

## 2016-04-12 MED ORDER — ARMC OPHTHALMIC DILATING GEL
1.0000 "application " | OPHTHALMIC | Status: AC
  Administered 2016-04-12 (×2): 1 via OPHTHALMIC

## 2016-04-12 MED ORDER — ALFENTANIL 500 MCG/ML IJ INJ
INJECTION | INTRAMUSCULAR | Status: DC | PRN
  Administered 2016-04-12: 500 ug via INTRAVENOUS

## 2016-04-12 MED ORDER — MOXIFLOXACIN HCL 0.5 % OP SOLN: 1.0000 [drp] | Freq: Once | OPHTHALMIC | Status: DC

## 2016-04-12 MED ORDER — POVIDONE-IODINE 5 % OP SOLN
1.0000 "application " | Freq: Once | OPHTHALMIC | Status: AC
  Administered 2016-04-12: 1 via OPHTHALMIC

## 2016-04-12 MED ORDER — MIDAZOLAM HCL 2 MG/2ML IJ SOLN
INTRAMUSCULAR | Status: DC | PRN
Start: 2016-04-12 — End: 2016-04-12
  Administered 2016-04-12 (×2): 0.5 mg via INTRAVENOUS

## 2016-04-12 MED ORDER — CEFUROXIME OPHTHALMIC INJECTION 1 MG/0.1 ML
INJECTION | OPHTHALMIC | Status: DC | PRN
  Administered 2016-04-12: .1 mL via INTRACAMERAL

## 2016-04-12 MED ORDER — NA CHONDROIT SULF-NA HYALURON 40-17 MG/ML IO SOLN
INTRAOCULAR | Status: DC | PRN
  Administered 2016-04-12: 1 mL via INTRAOCULAR

## 2016-04-12 MED ORDER — TETRACAINE HCL 0.5 % OP SOLN
OPHTHALMIC | Status: AC
  Filled 2016-04-12: qty 2

## 2016-04-12 MED ORDER — CEFUROXIME OPHTHALMIC INJECTION 1 MG/0.1 ML
INJECTION | OPHTHALMIC | Status: AC
  Filled 2016-04-12: qty 0.1

## 2016-04-12 MED ORDER — CARBACHOL 0.01 % IO SOLN
INTRAOCULAR | Status: DC | PRN
  Administered 2016-04-12: .5 mL via INTRAOCULAR

## 2016-04-12 MED ORDER — TETRACAINE HCL 0.5 % OP SOLN
1.0000 [drp] | Freq: Once | OPHTHALMIC | Status: AC
  Administered 2016-04-12: 1 [drp] via OPHTHALMIC

## 2016-04-12 MED ORDER — SODIUM CHLORIDE 0.9 % IV SOLN
INTRAVENOUS | Status: DC
  Administered 2016-04-12 (×2): via INTRAVENOUS

## 2016-04-12 MED ORDER — MOXIFLOXACIN HCL 0.5 % OP SOLN
OPHTHALMIC | Status: DC | PRN
  Administered 2016-04-12: 1 [drp] via OPHTHALMIC

## 2016-04-12 MED ORDER — MOXIFLOXACIN HCL 0.5 % OP SOLN
OPHTHALMIC | Status: AC
  Filled 2016-04-12: qty 3

## 2016-04-12 MED ORDER — EPINEPHRINE HCL 1 MG/ML IJ SOLN
INTRAMUSCULAR | Status: AC
  Filled 2016-04-12: qty 1

## 2016-04-12 MED ORDER — NA CHONDROIT SULF-NA HYALURON 40-17 MG/ML IO SOLN
INTRAOCULAR | Status: AC
  Filled 2016-04-12: qty 1

## 2016-04-12 MED ORDER — EPINEPHRINE HCL 1 MG/ML IJ SOLN
INTRAOCULAR | Status: DC | PRN
  Administered 2016-04-12: 1 mL via OPHTHALMIC

## 2016-04-12 MED ORDER — POVIDONE-IODINE 5 % OP SOLN
OPHTHALMIC | Status: AC
  Filled 2016-04-12: qty 30

## 2016-04-12 MED ORDER — ARMC OPHTHALMIC DILATING GEL
OPHTHALMIC | Status: AC
  Filled 2016-04-12: qty 0.25

## 2016-04-12 MED ORDER — POVIDONE-IODINE 5 % OP SOLN
OPHTHALMIC | Status: DC | PRN
  Administered 2016-04-12: 1 via OPHTHALMIC

## 2016-04-12 SURGICAL SUPPLY — 21 items
CANNULA ANT/CHMB 27GA (MISCELLANEOUS) ×3 IMPLANT
CUP MEDICINE 2OZ PLAST GRAD ST (MISCELLANEOUS) ×3 IMPLANT
GLOVE BIO SURGEON STRL SZ8 (GLOVE) ×3 IMPLANT
GLOVE BIOGEL M 6.5 STRL (GLOVE) ×3 IMPLANT
GLOVE SURG LX 8.0 MICRO (GLOVE) ×2
GLOVE SURG LX STRL 8.0 MICRO (GLOVE) ×1 IMPLANT
GOWN STRL REUS W/ TWL LRG LVL3 (GOWN DISPOSABLE) ×2 IMPLANT
GOWN STRL REUS W/TWL LRG LVL3 (GOWN DISPOSABLE) ×4
LENS IOL TECNIS ITEC 21.0 (Intraocular Lens) ×3 IMPLANT
PACK CATARACT (MISCELLANEOUS) ×3 IMPLANT
PACK CATARACT BRASINGTON LX (MISCELLANEOUS) ×3 IMPLANT
PACK EYE AFTER SURG (MISCELLANEOUS) ×3 IMPLANT
SOL BSS BAG (MISCELLANEOUS) ×3
SOL PREP PVP 2OZ (MISCELLANEOUS) ×3
SOLUTION BSS BAG (MISCELLANEOUS) ×1 IMPLANT
SOLUTION PREP PVP 2OZ (MISCELLANEOUS) ×1 IMPLANT
SYR 3ML LL SCALE MARK (SYRINGE) ×3 IMPLANT
SYR 5ML LL (SYRINGE) ×3 IMPLANT
SYR TB 1ML 27GX1/2 LL (SYRINGE) ×3 IMPLANT
WATER STERILE IRR 1000ML POUR (IV SOLUTION) ×3 IMPLANT
WIPE NON LINTING 3.25X3.25 (MISCELLANEOUS) ×3 IMPLANT

## 2016-04-12 NOTE — H&P (Signed)
  All labs reviewed. Abnormal studies sent to patients PCP when indicated.  Previous H&P reviewed, patient examined, there are NO CHANGES.  Barry [name] LOUIS6/27/20178:57 AM

## 2016-04-12 NOTE — Discharge Instructions (Signed)
Eye Surgery Discharge Instructions  Expect mild scratchy sensation or mild soreness. DO NOT RUB YOUR EYE!  The day of surgery:  Minimal physical activity, but bed rest is not required  No reading, computer work, or close hand work  No bending, lifting, or straining.  May watch TV  For 24 hours:  No driving, legal decisions, or alcoholic beverages  Safety precautions  Eat anything you prefer: It is better to start with liquids, then soup then solid foods.  _____ Eye patch should be worn until postoperative exam tomorrow.  ____ Solar shield eyeglasses should be worn for comfort in the sunlight/patch while sleeping  Resume all regular medications including aspirin or Coumadin if these were discontinued prior to surgery. You may shower, bathe, shave, or wash your hair. Tylenol may be taken for mild discomfort.  Call your doctor if you experience significant pain, nausea, or vomiting, fever > 101 or other signs of infection. [phone] or [phone] Specific instructions:  Follow-up Information    Follow up with Tim Lair, MD On 04/13/2016.   Specialty:  Ophthalmology   Why:  10:10   Contact information:   [address] [place] [address] [phone]

## 2016-04-12 NOTE — Anesthesia Preprocedure Evaluation (Signed)
Anesthesia Evaluation  Patient identified by MRN, date of birth, ID band Patient awake    Reviewed: Allergy & Precautions, NPO status , Patient's Chart, lab work & pertinent test results, reviewed documented beta blocker date and time   Airway Mallampati: II  TM Distance: >3 FB     Dental  (+) Chipped   Pulmonary           Cardiovascular hypertension, Pt. on medications and Pt. on home beta blockers      Neuro/Psych    GI/Hepatic GERD  Controlled,  Endo/Other    Renal/GU      Musculoskeletal  (+) Arthritis ,   Abdominal   Peds  Hematology   Anesthesia Other Findings   Reproductive/Obstetrics                             Anesthesia Physical Anesthesia Plan  ASA: III  Anesthesia Plan: MAC   Post-op Pain Management:    Induction:   Airway Management Planned:   Additional Equipment:   Intra-op Plan:   Post-operative Plan:   Informed Consent: I have reviewed the patients History and Physical, chart, labs and discussed the procedure including the risks, benefits and alternatives for the proposed anesthesia with the patient or authorized representative who has indicated his/her understanding and acceptance.     Plan Discussed with: CRNA  Anesthesia Plan Comments:         Anesthesia Quick Evaluation

## 2016-04-12 NOTE — Op Note (Signed)
PREOPERATIVE DIAGNOSIS:  Nuclear sclerotic cataract of the right eye.   POSTOPERATIVE DIAGNOSIS: nuclear sclerotic cataract right eye   OPERATIVE PROCEDURE:  Procedure(s): CATARACT EXTRACTION PHACO AND INTRAOCULAR LENS PLACEMENT (IOC)   SURGEON:  Birder Robson, MD.   ANESTHESIA:  Anesthesiologist: Gunnar Bulla, MD CRNA: Courtney Paris, CRNA  1.      Managed anesthesia care. 2.      Topical tetracaine drops followed by 2% Xylocaine jelly applied in the preoperative holding area.   COMPLICATIONS:  None.   TECHNIQUE:   Stop and chop   DESCRIPTION OF PROCEDURE:  The patient was examined and consented in the preoperative holding area where the aforementioned topical anesthesia was applied to the right eye and then brought back to the Operating Room where the right eye was prepped and draped in the usual sterile ophthalmic fashion and a lid speculum was placed. A paracentesis was created with the side port blade and the anterior chamber was filled with viscoelastic. A near clear corneal incision was performed with the steel keratome. A continuous curvilinear capsulorrhexis was performed with a cystotome followed by the capsulorrhexis forceps. Hydrodissection and hydrodelineation were carried out with BSS on a blunt cannula. The lens was removed in a stop and chop  technique and the remaining cortical material was removed with the irrigation-aspiration handpiece. The capsular bag was inflated with viscoelastic and the Technis [id number]  lens was placed in the capsular bag without complication. The remaining viscoelastic was removed from the eye with the irrigation-aspiration handpiece. The wounds were hydrated. The anterior chamber was flushed with Miostat and the eye was inflated to physiologic pressure. 0.1 mL of cefuroxime concentration 10 mg/mL was placed in the anterior chamber. The wounds were found to be water tight. The eye was dressed with Vigamox. The patient was given protective glasses to  wear throughout the day and a shield with which to sleep tonight. The patient was also given drops with which to begin a drop regimen today and will follow-up with me in one day.  Implant Name Type Inv. Item Serial No. Manufacturer Lot No. LRB No. Used  LENS IOL DIOP 21.0 - [id number] Intraocular Lens LENS IOL DIOP 21.0 [id number]   Right 1   Procedure(s) with comments: CATARACT EXTRACTION PHACO AND INTRAOCULAR LENS PLACEMENT (IOC) (Right) - [place]' 01:05 AP% 26.6 CDE 17.48 fluid pack lot # [id number]  Electronically signed: [place] 04/12/2016 9:26 AM

## 2016-04-12 NOTE — Anesthesia Postprocedure Evaluation (Signed)
Anesthesia Post Note  Patient: Barry [name]  Procedure(s) Performed: Procedure(s) (LRB): CATARACT EXTRACTION PHACO AND INTRAOCULAR LENS PLACEMENT (IOC) (Right)  Patient location during evaluation: Other Anesthesia Type: General Level of consciousness: awake and alert Pain management: pain level controlled Vital Signs Assessment: post-procedure vital signs reviewed and stable Respiratory status: spontaneous breathing, nonlabored ventilation, respiratory function stable and patient connected to nasal cannula oxygen Cardiovascular status: blood pressure returned to baseline and stable Postop Assessment: no signs of nausea or vomiting Anesthetic complications: no    Last Vitals:  Filed Vitals:   04/12/16 0929 04/12/16 0930  BP: 160/84 169/87  Pulse: 57 57  Temp: 36.6 C   Resp: 18 16    Last Pain: There were no vitals filed for this visit.               Mayrin Schmuck S

## 2016-04-12 NOTE — Transfer of Care (Signed)
Immediate Anesthesia Transfer of Care Note  Patient: Barry [name]  Procedure(s) Performed: Procedure(s) with comments: CATARACT EXTRACTION PHACO AND INTRAOCULAR LENS PLACEMENT (IOC) (Right) - [place]' 01:05 AP% 26.6 CDE 17.48 fluid pack lot # [id number]  Patient Location: PACU and Short Stay  Anesthesia Type:MAC  Level of Consciousness: awake, oriented and patient cooperative  Airway & Oxygen Therapy: Patient Spontanous Breathing and Patient connected to nasal cannula oxygen  Post-op Assessment: Report given to RN and Post -op Vital signs reviewed and stable  Post vital signs: stable  Last Vitals:  Filed Vitals:   04/12/16 0747  BP: 170/90  Pulse: 57  Temp: 36.6 C  Resp: 14    Last Pain: There were no vitals filed for this visit.       Complications: No apparent anesthesia complications

## 2016-05-17 DIAGNOSIS — D2271 Melanocytic nevi of right lower limb, including hip: Secondary | ICD-10-CM | POA: Diagnosis not present

## 2016-05-17 DIAGNOSIS — D2261 Melanocytic nevi of right upper limb, including shoulder: Secondary | ICD-10-CM | POA: Diagnosis not present

## 2016-05-17 DIAGNOSIS — D225 Melanocytic nevi of trunk: Secondary | ICD-10-CM | POA: Diagnosis not present

## 2016-05-17 DIAGNOSIS — D2272 Melanocytic nevi of left lower limb, including hip: Secondary | ICD-10-CM | POA: Diagnosis not present

## 2016-05-19 DIAGNOSIS — Z961 Presence of intraocular lens: Secondary | ICD-10-CM | POA: Diagnosis not present

## 2016-06-06 ENCOUNTER — Ambulatory Visit (INDEPENDENT_AMBULATORY_CARE_PROVIDER_SITE_OTHER): Payer: PPO | Admitting: Family Medicine

## 2016-06-06 ENCOUNTER — Encounter: Payer: Self-pay | Admitting: Family Medicine

## 2016-06-06 VITALS — BP 160/75 | HR 78 | Temp 98.2°F | Resp 16 | Ht 72.0 in | Wt 184.0 lb

## 2016-06-06 DIAGNOSIS — K219 Gastro-esophageal reflux disease without esophagitis: Secondary | ICD-10-CM | POA: Diagnosis not present

## 2016-06-06 DIAGNOSIS — E0800 Diabetes mellitus due to underlying condition with hyperosmolarity without nonketotic hyperglycemic-hyperosmolar coma (NKHHC): Secondary | ICD-10-CM | POA: Diagnosis not present

## 2016-06-06 DIAGNOSIS — M1990 Unspecified osteoarthritis, unspecified site: Secondary | ICD-10-CM | POA: Diagnosis not present

## 2016-06-06 DIAGNOSIS — I10 Essential (primary) hypertension: Secondary | ICD-10-CM | POA: Diagnosis not present

## 2016-06-06 DIAGNOSIS — E785 Hyperlipidemia, unspecified: Secondary | ICD-10-CM | POA: Diagnosis not present

## 2016-06-06 DIAGNOSIS — E1169 Type 2 diabetes mellitus with other specified complication: Secondary | ICD-10-CM | POA: Insufficient documentation

## 2016-06-06 MED ORDER — CARVEDILOL 6.25 MG PO TABS: 6.2500 mg | ORAL_TABLET | Freq: Two times a day (BID) | ORAL | 3 refills | Status: DC

## 2016-06-06 MED ORDER — SIMVASTATIN 20 MG PO TABS: 20.0000 mg | ORAL_TABLET | Freq: Every day | ORAL | 3 refills | Status: DC

## 2016-06-06 NOTE — Progress Notes (Signed)
Name: Barry [name]   MRN: [id number]    DOB: 04-Nov-1945   Date:06/06/2016       Progress Note  Subjective  Chief Complaint  Chief Complaint  Patient presents with  . Hypertension  . Hyperlipidemia  . Arthritis  . Diabetes    HPI Here for f/u of HBP, elevated lipoids and arthritis.  R knee dx'd with arthritis and treated with brace and Mobic.  Ov rall feeling well.  BPs at home 120s-130s/ 70s  No problem-specific Assessment & Plan notes found for this encounter.   Past Medical History:  Diagnosis Date  . Arthritis   . BPH (benign prostatic hypertrophy)   . GERD (gastroesophageal reflux disease)   . Hyperlipidemia   . Hypertension     Past Surgical History:  Procedure Laterality Date  . CATARACT EXTRACTION W/PHACO Right 04/12/2016   Procedure: CATARACT EXTRACTION PHACO AND INTRAOCULAR LENS PLACEMENT (IOC);  Surgeon: Birder Robson, MD;  Location: [facility] ORS;  Service: Ophthalmology;  Laterality: Right;  [place]' 01:05 AP% 26.6 CDE 17.48 fluid pack lot # [id number]  . KNEE SURGERY Right 09/21/2012    Family History  Problem Relation Age of Onset  . Diabetes Mother   . Heart attack Mother   . Cancer Brother 83    prostate   . Hyperlipidemia Paternal Aunt     Social History   Social History  . Marital status: Married    Spouse name: N/A  . Number of children: N/A  . Years of education: N/A   Occupational History  . Not on file.   Social History Main Topics  . Smoking status: Never Smoker  . Smokeless tobacco: Former Systems developer    Types: Chew  . Alcohol use No  . Drug use: No  . Sexual activity: Not on file   Other Topics Concern  . Not on file   Social History Narrative  . No narrative on file     Current Outpatient Prescriptions:  .  aspirin 81 MG tablet, Take 81 mg by mouth 2 (two) times daily., Disp: , Rfl:  .  meloxicam (MOBIC) 15 MG tablet, Take 15 mg by mouth as needed., Disp: , Rfl:  .  Multiple Vitamin (MULTIVITAMIN WITH MINERALS) TABS tablet, Take  1 tablet by mouth every other day., Disp: , Rfl:  .  ranitidine (ZANTAC) 150 MG capsule, Take 150 mg by mouth daily as needed. , Disp: , Rfl:  .  silodosin (RAPAFLO) 8 MG CAPS capsule, Take 8 mg by mouth., Disp: , Rfl:  .  simvastatin (ZOCOR) 20 MG tablet, Take 1 tablet (20 mg total) by mouth daily., Disp: 90 tablet, Rfl: 3 .  carvedilol (COREG) 6.25 MG tablet, Take 1 tablet (6.25 mg total) by mouth 2 (two) times daily with a meal., Disp: 60 tablet, Rfl: 3  Not on File   Review of Systems  Constitutional: Negative for chills, fever, malaise/fatigue and weight loss.  HENT: Negative for hearing loss.   Eyes: Negative for blurred vision and double vision.  Respiratory: Negative for cough, shortness of breath and wheezing.   Cardiovascular: Negative for chest pain, palpitations and leg swelling.  Gastrointestinal: Negative for abdominal pain, blood in stool and heartburn.  Genitourinary: Positive for frequency. Negative for dysuria and urgency.       Slowed urine stream.  Skin: Negative for rash.  Neurological: Negative for dizziness, tremors, weakness and headaches.      Objective  Vitals:   06/06/16 1056 06/06/16 1122  BP: (!) 174/88 Marland Kitchen)  160/75  Pulse: 69 78  Resp: 16   Temp: 98.2 F (36.8 C)   TempSrc: Oral   Weight: 184 lb (83.5 kg)   Height: 6' (1.829 m)     Physical Exam  Constitutional: He is oriented to person, place, and time and well-developed, well-nourished, and in no distress. No distress.  HENT:  Head: Normocephalic and atraumatic.  Eyes: Conjunctivae and EOM are normal. Pupils are equal, round, and reactive to light. No scleral icterus.  Neck: Normal range of motion. Neck supple. Carotid bruit is not present. No thyromegaly present.  Cardiovascular: Normal rate, regular rhythm and normal heart sounds.  Exam reveals no gallop and no friction rub.   No murmur heard. Pulmonary/Chest: Effort normal and breath sounds normal. No respiratory distress. He has no  wheezes. He has no rales.  Musculoskeletal: He exhibits no edema.  Lymphadenopathy:    He has no cervical adenopathy.  Neurological: He is alert and oriented to person, place, and time.  Vitals reviewed.      No results found for this or any previous visit (from the past 2160 hour(s)).   Assessment & Plan  Problem List Items Addressed This Visit      Cardiovascular and Mediastinum   Essential (primary) hypertension - Primary   Relevant Medications   carvedilol (COREG) 6.25 MG tablet   simvastatin (ZOCOR) 20 MG tablet   Other Relevant Orders   COMPLETE METABOLIC PANEL WITH GFR     Digestive   Gastric reflux     Endocrine   Diabetes (HCC)   Relevant Medications   simvastatin (ZOCOR) 20 MG tablet   Other Relevant Orders   HgB A1c     Musculoskeletal and Integument   Arthritis     Other   HLD (hyperlipidemia)   Relevant Medications   carvedilol (COREG) 6.25 MG tablet   simvastatin (ZOCOR) 20 MG tablet   Other Relevant Orders   Lipid Profile    Other Visit Diagnoses   None.     Meds ordered this encounter  Medications  . carvedilol (COREG) 6.25 MG tablet    Sig: Take 1 tablet (6.25 mg total) by mouth 2 (two) times daily with a meal.    Dispense:  60 tablet    Refill:  3  . simvastatin (ZOCOR) 20 MG tablet    Sig: Take 1 tablet (20 mg total) by mouth daily.    Dispense:  90 tablet    Refill:  3   1. Essential (primary) hypertension  - carvedilol (COREG) 6.25 MG tablet; Take 1 tablet (6.25 mg total) by mouth 2 (two) times daily with a meal.  Dispense: 60 tablet; Refill: 3 - COMPLETE METABOLIC PANEL WITH GFR  2. Gastric reflux Cont Zantac  3. HLD (hyperlipidemia)  - simvastatin (ZOCOR) 20 MG tablet; Take 1 tablet (20 mg total) by mouth daily.  Dispense: 90 tablet; Refill: 3 - Lipid Profile  4. Arthritis Use Mobic prn only  5. Diabetes mellitus due to underlying condition with hyperosmolarity without coma, without long-term current use of insulin  (HCC)  - HgB A1c

## 2016-06-15 ENCOUNTER — Other Ambulatory Visit: Payer: PPO

## 2016-06-15 DIAGNOSIS — E0800 Diabetes mellitus due to underlying condition with hyperosmolarity without nonketotic hyperglycemic-hyperosmolar coma (NKHHC): Secondary | ICD-10-CM | POA: Diagnosis not present

## 2016-06-15 DIAGNOSIS — I10 Essential (primary) hypertension: Secondary | ICD-10-CM | POA: Diagnosis not present

## 2016-06-15 DIAGNOSIS — E785 Hyperlipidemia, unspecified: Secondary | ICD-10-CM | POA: Diagnosis not present

## 2016-06-16 ENCOUNTER — Encounter: Payer: Self-pay | Admitting: *Deleted

## 2016-06-16 LAB — COMPLETE METABOLIC PANEL WITH GFR
ALBUMIN: 4.4 g/dL (ref 3.6–5.1)
ALK PHOS: 71 U/L (ref 40–115)
ALT: 25 U/L (ref 9–46)
AST: 21 U/L (ref 10–35)
BUN: 13 mg/dL (ref 7–25)
CALCIUM: 9 mg/dL (ref 8.6–10.3)
CO2: 26 mmol/L (ref 20–31)
CREATININE: 0.88 mg/dL (ref 0.70–1.18)
Chloride: 105 mmol/L (ref 98–110)
GFR, Est African American: >89 mL/min (ref >=60)
GFR, Est Non African American: 87 mL/min (ref >=60)
Glucose, Bld: 111 mg/dL — ABNORMAL HIGH (ref 65–99)
Potassium: 4.4 mmol/L (ref 3.5–5.3)
Sodium: 140 mmol/L (ref 135–146)
Total Bilirubin: 0.6 mg/dL (ref 0.2–1.2)
Total Protein: 6.5 g/dL (ref 6.1–8.1)

## 2016-06-16 LAB — HEMOGLOBIN A1C
HEMOGLOBIN A1C: 5.9 % — AB (ref <5.7)
MEAN PLASMA GLUCOSE: 123 mg/dL

## 2016-06-16 LAB — LIPID PANEL
CHOLESTEROL: 157 mg/dL (ref 125–200)
HDL: 44 mg/dL (ref >=40)
LDL Cholesterol: 90 mg/dL (ref <130)
Total CHOL/HDL Ratio: 3.6 Ratio (ref <=5.0)
Triglycerides: 116 mg/dL (ref <150)
VLDL: 23 mg/dL (ref <30)

## 2016-08-09 ENCOUNTER — Ambulatory Visit (INDEPENDENT_AMBULATORY_CARE_PROVIDER_SITE_OTHER): Payer: PPO | Admitting: Family Medicine

## 2016-08-09 ENCOUNTER — Encounter: Payer: Self-pay | Admitting: Family Medicine

## 2016-08-09 VITALS — BP 160/80 | HR 78 | Temp 97.8°F | Resp 16 | Ht 72.0 in | Wt 182.0 lb

## 2016-08-09 DIAGNOSIS — I10 Essential (primary) hypertension: Secondary | ICD-10-CM

## 2016-08-09 MED ORDER — CARVEDILOL 12.5 MG PO TABS: 12.5000 mg | ORAL_TABLET | Freq: Two times a day (BID) | ORAL | 12 refills | Status: DC

## 2016-08-09 NOTE — Patient Instructions (Signed)
Had flu shot at pharmacy

## 2016-08-09 NOTE — Progress Notes (Signed)
Name: Barry [name]   MRN: [id number]    DOB: April 01, 1946   Date:08/09/2016       Progress Note  Subjective  Chief Complaint  Chief Complaint  Patient presents with  . Hypertension    HPI Here for f/u of HBP./  He would like to see Cardiologist about his BP.  No problem-specific Assessment & Plan notes found for this encounter.   Past Medical History:  Diagnosis Date  . Arthritis   . BPH (benign prostatic hypertrophy)   . GERD (gastroesophageal reflux disease)   . Hyperlipidemia   . Hypertension     Past Surgical History:  Procedure Laterality Date  . CATARACT EXTRACTION W/PHACO Right 04/12/2016   Procedure: CATARACT EXTRACTION PHACO AND INTRAOCULAR LENS PLACEMENT (IOC);  Surgeon: Birder Robson, MD;  Location: [facility] ORS;  Service: Ophthalmology;  Laterality: Right;  [place]' 01:05 AP% 26.6 CDE 17.48 fluid pack lot # [id number]  . KNEE SURGERY Right 09/21/2012    Family History  Problem Relation Age of Onset  . Diabetes Mother   . Heart attack Mother   . Cancer Brother 39    prostate   . Hyperlipidemia Paternal Aunt     Social History   Social History  . Marital status: Married    Spouse name: N/A  . Number of children: N/A  . Years of education: N/A   Occupational History  . Not on file.   Social History Main Topics  . Smoking status: Never Smoker  . Smokeless tobacco: Former Systems developer    Types: Chew  . Alcohol use No  . Drug use: No  . Sexual activity: Not on file   Other Topics Concern  . Not on file   Social History Narrative  . No narrative on file     Current Outpatient Prescriptions:  .  aspirin 81 MG tablet, Take 81 mg by mouth 2 (two) times daily., Disp: , Rfl:  .  carvedilol (COREG) 12.5 MG tablet, Take 1 tablet (12.5 mg total) by mouth 2 (two) times daily with a meal., Disp: 60 tablet, Rfl: 12 .  Multiple Vitamin (MULTIVITAMIN WITH MINERALS) TABS tablet, Take 1 tablet by mouth every other day., Disp: , Rfl:  .  ranitidine (ZANTAC) 150 MG  capsule, Take 150 mg by mouth daily as needed. , Disp: , Rfl:  .  silodosin (RAPAFLO) 8 MG CAPS capsule, Take 8 mg by mouth., Disp: , Rfl:  .  simvastatin (ZOCOR) 20 MG tablet, Take 1 tablet (20 mg total) by mouth daily., Disp: 90 tablet, Rfl: 3  Not on File   Review of Systems  Constitutional: Negative for chills, fever, malaise/fatigue and weight loss.  HENT: Negative for hearing loss.   Eyes: Negative for blurred vision and double vision.  Respiratory: Negative for cough, shortness of breath and wheezing.   Cardiovascular: Negative for chest pain, palpitations and leg swelling.  Gastrointestinal: Negative for abdominal pain, blood in stool and heartburn.  Genitourinary: Negative for dysuria, frequency and urgency.  Musculoskeletal: Negative for back pain and myalgias.  Skin: Negative for rash.  Neurological: Negative for dizziness, tremors, weakness and headaches.      Objective  Vitals:   08/09/16 1022 08/09/16 1140  BP: (!) 155/84 (!) 160/80  Pulse: 71 78  Resp: 16   Temp: 97.8 F (36.6 C)   TempSrc: Oral   Weight: 182 lb (82.6 kg)   Height: 6' (1.829 m)     Physical Exam  Constitutional: He is oriented to person, place,  and time and well-developed, well-nourished, and in no distress. No distress.  HENT:  Head: Normocephalic and atraumatic.  Eyes: Conjunctivae and EOM are normal. Pupils are equal, round, and reactive to light. No scleral icterus.  Neck: Normal range of motion. Neck supple. Carotid bruit is not present. No thyromegaly present.  Cardiovascular: Normal rate, regular rhythm and normal heart sounds.  Exam reveals no gallop and no friction rub.   No murmur heard. Pulmonary/Chest: Effort normal and breath sounds normal. No respiratory distress. He has no wheezes. He has no rales.  Abdominal: Soft. Bowel sounds are normal. He exhibits no distension and no mass. There is no tenderness.  Musculoskeletal: He exhibits no edema.  Lymphadenopathy:    He has no  cervical adenopathy.  Neurological: He is alert and oriented to person, place, and time.  Vitals reviewed.      Recent Results (from the past 2160 hour(s))  COMPLETE METABOLIC PANEL WITH GFR     Status: Abnormal   Collection Time: 06/15/16  8:15 AM  Result Value Ref Range   Sodium 140 135 - 146 mmol/L   Potassium 4.4 3.5 - 5.3 mmol/L   Chloride 105 98 - 110 mmol/L   CO2 26 20 - 31 mmol/L   Glucose, Bld 111 (H) 65 - 99 mg/dL   BUN 13 7 - 25 mg/dL   Creat 0.88 0.70 - 1.18 mg/dL    Comment:   For patients 70 years of age: The upper reference limit for Creatinine is approximately 13% higher for people identified as African-American.      Total Bilirubin 0.6 0.2 - 1.2 mg/dL   Alkaline Phosphatase 71 40 - 115 U/L   AST 21 10 - 35 U/L   ALT 25 9 - 46 U/L   Total Protein 6.5 6.1 - 8.1 g/dL   Albumin 4.4 3.6 - 5.1 g/dL   Calcium 9.0 8.6 - 10.3 mg/dL   GFR, Est African American >89 >=60 mL/min   GFR, Est Non African American 87 >=60 mL/min  Lipid Profile     Status: None   Collection Time: 06/15/16  8:15 AM  Result Value Ref Range   Cholesterol 157 125 - 200 mg/dL   Triglycerides 116 <150 mg/dL   HDL 44 >=40 mg/dL   Total CHOL/HDL Ratio 3.6 <=5.0 Ratio   VLDL 23 <30 mg/dL   LDL Cholesterol 90 <130 mg/dL    Comment:   Total Cholesterol/HDL Ratio:CHD Risk                        Coronary Heart Disease Risk Table                                        Men       Women          1/2 Average Risk              3.4        3.3              Average Risk              5.0        4.4           2X Average Risk              9.6  7.1           3X Average Risk             23.4       11.0 Use the calculated Patient Ratio above and the CHD Risk table  to determine the patient's CHD Risk.   HgB A1c     Status: Abnormal   Collection Time: 06/15/16  8:15 AM  Result Value Ref Range   Hgb A1c MFr Bld 5.9 (H) <5.7 %    Comment:   For someone without known diabetes, a hemoglobin A1c  value between 5.7% and 6.4% is consistent with prediabetes and should be confirmed with a follow-up test.   For someone with known diabetes, a value <7% indicates that their diabetes is well controlled. A1c targets should be individualized based on duration of diabetes, age, co-morbid conditions and other considerations.   This assay result is consistent with an increased risk of diabetes.   Currently, no consensus exists regarding use of hemoglobin A1c for diagnosis of diabetes in children.      Mean Plasma Glucose 123 mg/dL     Assessment & Plan  Problem List Items Addressed This Visit      Cardiovascular and Mediastinum   Essential (primary) hypertension - Primary   Relevant Medications   carvedilol (COREG) 12.5 MG tablet   Other Relevant Orders   Ambulatory referral to Cardiology    Other Visit Diagnoses   None.     Meds ordered this encounter  Medications  . carvedilol (COREG) 12.5 MG tablet    Sig: Take 1 tablet (12.5 mg total) by mouth 2 (two) times daily with a meal.    Dispense:  60 tablet    Refill:  12   1. Essential (primary) hypertension  - carvedilol (COREG) 12.5 MG tablet; Take 1 tablet (12.5 mg total) by mouth 2 (two) times daily with a meal.  Dispense: 60 tablet; Refill: 12 - Ambulatory referral to Cardiology

## 2016-08-30 ENCOUNTER — Ambulatory Visit (INDEPENDENT_AMBULATORY_CARE_PROVIDER_SITE_OTHER): Payer: PPO | Admitting: Cardiovascular Disease

## 2016-08-30 ENCOUNTER — Encounter: Payer: Self-pay | Admitting: Cardiovascular Disease

## 2016-08-30 VITALS — BP 126/86 | HR 72 | Ht 67.0 in | Wt 184.5 lb

## 2016-08-30 DIAGNOSIS — Z0000 Encounter for general adult medical examination without abnormal findings: Secondary | ICD-10-CM

## 2016-08-30 DIAGNOSIS — E0800 Diabetes mellitus due to underlying condition with hyperosmolarity without nonketotic hyperglycemic-hyperosmolar coma (NKHHC): Secondary | ICD-10-CM | POA: Diagnosis not present

## 2016-08-30 DIAGNOSIS — I10 Essential (primary) hypertension: Secondary | ICD-10-CM | POA: Diagnosis not present

## 2016-08-30 DIAGNOSIS — E7800 Pure hypercholesterolemia, unspecified: Secondary | ICD-10-CM

## 2016-08-30 MED ORDER — CARVEDILOL 12.5 MG PO TABS: 12.5000 mg | ORAL_TABLET | Freq: Two times a day (BID) | ORAL | 3 refills | Status: DC

## 2016-08-30 MED ORDER — SIMVASTATIN 20 MG PO TABS
20.0000 mg | ORAL_TABLET | Freq: Every day | ORAL | 3 refills | Status: DC
Start: 2016-08-30 — End: 2017-07-04

## 2016-08-30 NOTE — Patient Instructions (Signed)

## 2016-08-30 NOTE — Progress Notes (Signed)
Cardiology Office Note  Date:  08/30/2016   ID:  Abdurrahman, South[place] 01/27/1946, MRN [id number]  PCP:  Dicky Doe, MD   Chief Complaint  Patient presents with  . other    Ref by Dr. Luan Pulling for HTN; having difficulty getting BP regulated. Meds reviewed by the pt.'s bottles.     HPI:  70 year old gentleman with history of hypertension, hyperlipidemia, arthritis, Chronic right knee pain, who presents by referral from Dr. Luan Pulling for consultation of his hypertension and risk factors for coronary disease  Reports that he feels well, main complaint is right knee pain Currently wearing a brace Has not had cortisone shots in the past Otherwise active, works on a farm, takes care of cows  He has been watching his diet, cut back on his sugar drinks Lab work reviewed hemoglobin A1c 5.9 (was 6.8),  total cholesterol 157, LDL 90  We discussed his previous blood pressure medications Reports having various side effects to most of the medications he has tried Amlodipine, he developed swelling HCTZ, reported having dry skin, dry mouth Lisinopril, developed a cough Atenolol: did not work Coreg: ok so far  Carvedilol dose recently increased up to 12.5 mg twice a day Blood pressure numbers provided today, typically [id number] up to [id number] systolic, rarely [id number]  EKG on today's visit shows normal sinus rhythm with rate 72 bpm, no significant ST or T-wave changes  Family history of diabetes, MI Mom with CVA Dad with alzheimers  PMH:   has a past medical history of Arthritis; BPH (benign prostatic hypertrophy); GERD (gastroesophageal reflux disease); Hyperlipidemia; and Hypertension.  PSH:    Past Surgical History:  Procedure Laterality Date  . CATARACT EXTRACTION W/PHACO Right 04/12/2016   Procedure: CATARACT EXTRACTION PHACO AND INTRAOCULAR LENS PLACEMENT (IOC);  Surgeon: Birder Robson, MD;  Location: [facility] ORS;  Service: Ophthalmology;  Laterality: Right;  [place]' 01:05 AP% 26.6 CDE 17.48 fluid  pack lot # [id number]  . KNEE SURGERY Right 09/21/2012    Current Outpatient Prescriptions  Medication Sig Dispense Refill  . aspirin 81 MG tablet Take 81 mg by mouth 2 (two) times daily.    . carvedilol (COREG) 12.5 MG tablet Take 1 tablet (12.5 mg total) by mouth 2 (two) times daily with a meal. 180 tablet 3  . Multiple Vitamin (MULTIVITAMIN WITH MINERALS) TABS tablet Take 1 tablet by mouth every other day.    . ranitidine (ZANTAC) 150 MG capsule Take 150 mg by mouth daily as needed.     . silodosin (RAPAFLO) 8 MG CAPS capsule Take 8 mg by mouth.    . simvastatin (ZOCOR) 20 MG tablet Take 1 tablet (20 mg total) by mouth daily. 90 tablet 3   No current facility-administered medications for this visit.      Allergies:   Patient has no known allergies.   Social History:  The patient  reports that he has never smoked. He has quit using smokeless tobacco. His smokeless tobacco use included Chew. He reports that he does not drink alcohol or use drugs.   Family History:   family history includes Cancer (age of onset: 30) in his brother; Diabetes in his mother; Hyperlipidemia in his paternal aunt; Stroke in his mother.    Review of Systems: Review of Systems  Constitutional: Negative.   Respiratory: Negative.   Cardiovascular: Negative.   Gastrointestinal: Negative.   Musculoskeletal: Positive for joint pain.  Neurological: Negative.   Psychiatric/Behavioral: Negative.   All other systems reviewed and are negative.  PHYSICAL EXAM: VS:  BP 126/86 (BP Location: Right Arm, Patient Position: Sitting, Cuff Size: Normal)   Pulse 72   Ht 5\' 7"  (1.702 m)   Wt 184 lb 8 oz (83.7 kg)   BMI 28.90 kg/m  , BMI Body mass index is 28.9 kg/m. GEN: Well nourished, well developed, in no acute distress  HEENT: normal  Neck: no JVD, carotid bruits, or masses Cardiac: RRR; no murmurs, rubs, or gallops,no edema  Respiratory:  clear to auscultation bilaterally, normal work of breathing GI: soft,  nontender, nondistended, + BS MS: no deformity or atrophy , right knee in a brace Skin: warm and dry, no rash Neuro:  Strength and sensation are intact Psych: euthymic mood, full affect    Recent Labs: 11/30/2015: Platelets 241; TSH 1.170 06/15/2016: ALT 25; BUN 13; Creat 0.88; Potassium 4.4; Sodium 140    Lipid Panel Lab Results  Component Value Date   CHOL 157 06/15/2016   HDL 44 06/15/2016   LDLCALC 90 06/15/2016   TRIG 116 06/15/2016      Wt Readings from Last 3 Encounters:  08/30/16 184 lb 8 oz (83.7 kg)  08/09/16 182 lb (82.6 kg)  06/06/16 184 lb (83.5 kg)       ASSESSMENT AND PLAN:  Essential (primary) hypertension - Discussed various medications he has tried in the past, side effects associated with each one. Seems to be doing well on carvedilol 12.5 mg twice a day, blood pressure numbers have improved. No changes made to his medications Denies having any fatigue symptoms  Pure hypercholesterolemia - Plan: simvastatin (ZOCOR) 20 MG tablet Tolerating simvastatin, mild improvement of numbers with drop in hemoglobin A1c No known coronary disease  Diabetes mellitus due to underlying condition with hyperosmolarity without coma, without long-term current use of insulin (HCC) Significant drop and A1c with dietary changes Currently not on medications  Preventive healthcare Discussed various screening techniques for coronary artery disease including CT coronary calcium score. He is not interested at this time If he develops any chest pain symptoms concerning for angina, could consider further testing   Total encounter time more than 45 minutes  Greater than 50% was spent in counseling and coordination of care with the patient    Disposition:   F/U  12 months   Orders Placed This Encounter  Procedures  . EKG 12-Lead     Signed, Esmond Plants, M.D., Ph.D. 08/30/2016  [place], [place]

## 2016-09-13 ENCOUNTER — Ambulatory Visit: Payer: PPO | Admitting: Family Medicine

## 2016-11-14 ENCOUNTER — Ambulatory Visit (INDEPENDENT_AMBULATORY_CARE_PROVIDER_SITE_OTHER): Payer: PPO | Admitting: Family Medicine

## 2016-11-14 ENCOUNTER — Encounter: Payer: Self-pay | Admitting: Family Medicine

## 2016-11-14 VITALS — BP 150/80 | HR 66 | Temp 98.1°F | Resp 16 | Ht 69.0 in | Wt 183.0 lb

## 2016-11-14 DIAGNOSIS — E0800 Diabetes mellitus due to underlying condition with hyperosmolarity without nonketotic hyperglycemic-hyperosmolar coma (NKHHC): Secondary | ICD-10-CM | POA: Diagnosis not present

## 2016-11-14 DIAGNOSIS — R350 Frequency of micturition: Secondary | ICD-10-CM | POA: Diagnosis not present

## 2016-11-14 DIAGNOSIS — N401 Enlarged prostate with lower urinary tract symptoms: Secondary | ICD-10-CM | POA: Diagnosis not present

## 2016-11-14 DIAGNOSIS — B9789 Other viral agents as the cause of diseases classified elsewhere: Secondary | ICD-10-CM

## 2016-11-14 DIAGNOSIS — I10 Essential (primary) hypertension: Secondary | ICD-10-CM | POA: Diagnosis not present

## 2016-11-14 DIAGNOSIS — J069 Acute upper respiratory infection, unspecified: Secondary | ICD-10-CM | POA: Diagnosis not present

## 2016-11-14 DIAGNOSIS — R7309 Other abnormal glucose: Secondary | ICD-10-CM | POA: Diagnosis not present

## 2016-11-14 LAB — POCT GLYCOSYLATED HEMOGLOBIN (HGB A1C)

## 2016-11-14 MED ORDER — LORATADINE 10 MG PO TABS: 10.0000 mg | ORAL_TABLET | Freq: Every day | ORAL | 11 refills | Status: DC

## 2016-11-14 MED ORDER — BENZONATATE 100 MG PO CAPS: 100.0000 mg | ORAL_CAPSULE | Freq: Three times a day (TID) | ORAL | 0 refills | Status: DC | PRN

## 2016-11-14 NOTE — Progress Notes (Signed)
Name: Barry [name]   MRN: [id number]    DOB: Jun 23, 1946   Date:11/14/2016       Progress Note  Subjective  Chief Complaint  Chief Complaint  Patient presents with  . Hypertension  . Hyperlipidemia  . Hyperglycemia  . Cough    Hypertension  Pertinent negatives include no blurred vision, chest pain, headaches, malaise/fatigue, palpitations or shortness of breath.  Hyperlipidemia  Pertinent negatives include no chest pain, myalgias or shortness of breath.  Hyperglycemia  Associated symptoms include congestion and coughing. Pertinent negatives include no abdominal pain, chest pain, chills, fever, headaches, myalgias, rash or weakness.  Cough  Pertinent negatives include no chest pain, chills, fever, headaches, heartburn, myalgias, rash, shortness of breath, weight loss or wheezing.   Here for f/u of HBP, elevated lipids and hyperglycemia.  He is taking al;l meds.  He states that hsi BP at home has been in 130s - 140 sys. C/o some nasal congestion and throaty cough.  No fever.  No production.  No SOB.  Present for past week.  No problem-specific Assessment & Plan notes found for this encounter.   Past Medical History:  Diagnosis Date  . Arthritis   . BPH (benign prostatic hypertrophy)   . GERD (gastroesophageal reflux disease)   . Hyperlipidemia   . Hypertension     Past Surgical History:  Procedure Laterality Date  . CATARACT EXTRACTION W/PHACO Right 04/12/2016   Procedure: CATARACT EXTRACTION PHACO AND INTRAOCULAR LENS PLACEMENT (IOC);  Surgeon: Birder Robson, MD;  Location: [facility] ORS;  Service: Ophthalmology;  Laterality: Right;  [place]' 01:05 AP% 26.6 CDE 17.48 fluid pack lot # [id number]  . KNEE SURGERY Right 09/21/2012    Family History  Problem Relation Age of Onset  . Diabetes Mother   . Stroke Mother   . Cancer Brother 70    prostate   . Hyperlipidemia Paternal Aunt     Social History   Social History  . Marital status: Married    Spouse name: N/A  .  Number of children: N/A  . Years of education: N/A   Occupational History  . Not on file.   Social History Main Topics  . Smoking status: Never Smoker  . Smokeless tobacco: Former Systems developer    Types: Chew  . Alcohol use No  . Drug use: No  . Sexual activity: Not on file   Other Topics Concern  . Not on file   Social History Narrative  . No narrative on file     Current Outpatient Prescriptions:  .  aspirin 81 MG tablet, Take 81 mg by mouth 2 (two) times daily., Disp: , Rfl:  .  carvedilol (COREG) 12.5 MG tablet, Take 1 tablet (12.5 mg total) by mouth 2 (two) times daily with a meal., Disp: 180 tablet, Rfl: 3 .  Multiple Vitamin (MULTIVITAMIN WITH MINERALS) TABS tablet, Take 1 tablet by mouth every other day., Disp: , Rfl:  .  ranitidine (ZANTAC) 150 MG capsule, Take 150 mg by mouth daily as needed. , Disp: , Rfl:  .  silodosin (RAPAFLO) 8 MG CAPS capsule, Take 8 mg by mouth daily with breakfast. , Disp: , Rfl:  .  simvastatin (ZOCOR) 20 MG tablet, Take 1 tablet (20 mg total) by mouth daily., Disp: 90 tablet, Rfl: 3 .  benzonatate (TESSALON) 100 MG capsule, Take 1 capsule (100 mg total) by mouth 3 (three) times daily as needed for cough., Disp: 30 capsule, Rfl: 0 .  loratadine (CLARITIN) 10 MG tablet, Take  1 tablet (10 mg total) by mouth daily., Disp: 30 tablet, Rfl: 11  Not on File   Review of Systems  Constitutional: Negative for chills, fever, malaise/fatigue and weight loss.  HENT: Positive for congestion. Negative for hearing loss and tinnitus.   Eyes: Negative for blurred vision and double vision.  Respiratory: Positive for cough. Negative for sputum production, shortness of breath and wheezing.   Cardiovascular: Negative for chest pain, palpitations and claudication.  Gastrointestinal: Negative for abdominal pain, blood in stool and heartburn.  Genitourinary: Negative for dysuria, frequency and urgency.  Musculoskeletal: Negative for joint pain and myalgias.  Skin:  Negative for rash.  Neurological: Negative for dizziness, tingling, tremors, weakness and headaches.      Objective  Vitals:   11/14/16 1102 11/14/16 1201  BP: (!) 145/75 (!) 150/80  Pulse: 66   Resp: 16   Temp: 98.1 F (36.7 C)   TempSrc: Oral   Weight: 183 lb (83 kg)   Height: 5\' 9"  (1.753 m)     Physical Exam  Constitutional: He is oriented to person, place, and time and well-developed, well-nourished, and in no distress. No distress.  HENT:  Head: Normocephalic and atraumatic.  Right Ear: External ear normal.  Left Ear: External ear normal.  Nose: Mucosal edema and rhinorrhea present. Right sinus exhibits no maxillary sinus tenderness and no frontal sinus tenderness. Left sinus exhibits no maxillary sinus tenderness and no frontal sinus tenderness.  Eyes: Conjunctivae and EOM are normal. Pupils are equal, round, and reactive to light. No scleral icterus.  Neck: Normal range of motion. Neck supple. Carotid bruit is not present. No thyromegaly present.  Cardiovascular: Normal rate, regular rhythm and normal heart sounds.  Exam reveals no gallop and no friction rub.   No murmur heard. Pulmonary/Chest: Effort normal and breath sounds normal. No respiratory distress. He has no wheezes. He has no rales.  Abdominal: Soft. Bowel sounds are normal. He exhibits no distension and no mass. There is no tenderness.  Musculoskeletal: He exhibits no edema.  Lymphadenopathy:    He has no cervical adenopathy.  Neurological: He is alert and oriented to person, place, and time.  Vitals reviewed.      Recent Results (from the past 2160 hour(s))  POCT HgB A1C     Status: Abnormal   Collection Time: 11/14/16 11:22 AM  Result Value Ref Range   Hemoglobin A1C 6.6%      Assessment & Plan  Problem List Items Addressed This Visit      Cardiovascular and Mediastinum   Essential (primary) hypertension     Endocrine   Diabetes ([place])     Genitourinary   Benign prostatic hyperplasia  with lower urinary tract symptoms    Other Visit Diagnoses    Elevated glucose    -  Primary   Relevant Orders   POCT HgB A1C (Completed)   Viral upper respiratory tract infection       Relevant Medications   loratadine (CLARITIN) 10 MG tablet   benzonatate (TESSALON) 100 MG capsule      Meds ordered this encounter  Medications  . loratadine (CLARITIN) 10 MG tablet    Sig: Take 1 tablet (10 mg total) by mouth daily.    Dispense:  30 tablet    Refill:  11  . benzonatate (TESSALON) 100 MG capsule    Sig: Take 1 capsule (100 mg total) by mouth 3 (three) times daily as needed for cough.    Dispense:  30 capsule  Refill:  0   1. Elevated glucose  - POCT HgB A1C - 6.6 2. Essential (primary) hypertension Cont Carvedilol Watch BP after URI resolved.  3. Diabetes mellitus due to underlying condition with hyperosmolarity without coma, without long-term current use of insulin (HCC) Watch sugars and starches 4. Benign prostatic hyperplasia with urinary frequency Cont Rapafo and cont to see Dr. Bernardo Heater.  5. Viral upper respiratory tract infection  - loratadine (CLARITIN) 10 MG tablet; Take 1 tablet (10 mg total) by mouth daily.  Dispense: 30 tablet; Refill: 11 - benzonatate (TESSALON) 100 MG capsule; Take 1 capsule (100 mg total) by mouth 3 (three) times daily as needed for cough.  Dispense: 30 capsule; Refill: 0

## 2016-11-22 DIAGNOSIS — I10 Essential (primary) hypertension: Secondary | ICD-10-CM | POA: Diagnosis not present

## 2016-11-22 DIAGNOSIS — Z79899 Other long term (current) drug therapy: Secondary | ICD-10-CM | POA: Diagnosis not present

## 2016-11-22 DIAGNOSIS — N138 Other obstructive and reflux uropathy: Secondary | ICD-10-CM | POA: Diagnosis not present

## 2016-11-22 DIAGNOSIS — N401 Enlarged prostate with lower urinary tract symptoms: Secondary | ICD-10-CM | POA: Diagnosis not present

## 2016-11-22 DIAGNOSIS — Z720 Tobacco use: Secondary | ICD-10-CM | POA: Diagnosis not present

## 2016-12-22 DIAGNOSIS — H43813 Vitreous degeneration, bilateral: Secondary | ICD-10-CM | POA: Diagnosis not present

## 2017-01-03 ENCOUNTER — Ambulatory Visit: Payer: PPO | Admitting: Family Medicine

## 2017-01-30 ENCOUNTER — Other Ambulatory Visit: Payer: Self-pay | Admitting: Family Medicine

## 2017-01-30 ENCOUNTER — Encounter: Payer: Self-pay | Admitting: Family Medicine

## 2017-01-30 ENCOUNTER — Ambulatory Visit (INDEPENDENT_AMBULATORY_CARE_PROVIDER_SITE_OTHER): Payer: PPO | Admitting: Family Medicine

## 2017-01-30 VITALS — BP 136/74 | HR 71 | Temp 97.8°F | Resp 16 | Ht 69.0 in | Wt 189.0 lb

## 2017-01-30 DIAGNOSIS — I10 Essential (primary) hypertension: Secondary | ICD-10-CM | POA: Diagnosis not present

## 2017-01-30 DIAGNOSIS — E119 Type 2 diabetes mellitus without complications: Secondary | ICD-10-CM

## 2017-01-30 DIAGNOSIS — Z0000 Encounter for general adult medical examination without abnormal findings: Secondary | ICD-10-CM

## 2017-01-30 DIAGNOSIS — E7800 Pure hypercholesterolemia, unspecified: Secondary | ICD-10-CM

## 2017-01-30 DIAGNOSIS — E782 Mixed hyperlipidemia: Secondary | ICD-10-CM

## 2017-01-30 NOTE — Assessment & Plan Note (Signed)
Well-controlled at goal < 7.0, today A1c 6.6, prior 6.8 to 5.9 No complications or hypoglycemia.  Plan:  1. Remain diet controlled, encouraged continue improve DM diet and lifestyle, exercise 2. DM foot exam today normal 3. Cont ASA 81mg , statin 4. Follow-up 5 months A1c

## 2017-01-30 NOTE — Assessment & Plan Note (Addendum)
Stable, controlled on statin Last lipids 05/2016  Plan: 1. No change - continue simvastatin 20mg  daily 2. Check future fasting lipids, next 5 months Annual Physical - Will calculated ASCVD risk 3. Reduce ASA from 81 BID to 81mg  daily for ASCVD primary risk reduction

## 2017-01-30 NOTE — Patient Instructions (Signed)
Thank you for coming in to clinic today.  1. For the dry mouth, recommend trying Fish Oil supplement over the counter once or twice a day with food, this will help cholesterol as well  Recommed switching Aspirin to once daily, do not need to take twice daily  2. BP is controlled continue current medication  3 Permanent Handicap placard signed today  You will be due for FASTING BLOOD WORK (no food or drink after midnight before, only water or coffee without cream/sugar on the morning of)  - Please go ahead and schedule a "Lab Only" visit in the morning at the clinic for lab draw after 06/15/17 before next Annual Physical - Make sure Lab Only appointment is at least 1-2 weeks before your next appointment, so that results will be available  For Lab Results, once available within 2-3 days of blood draw, you can can log in to MyChart online to view your results and a brief explanation. Also, we can discuss results at next follow-up visit.  Please schedule a follow-up appointment with Dr. Parks Ranger in September 2018 for Annual Physical  If you have any other questions or concerns, please feel free to call the clinic or send a message through [place]. You may also schedule an earlier appointment if necessary.  Nobie Putnam, DO [place]

## 2017-01-30 NOTE — Assessment & Plan Note (Signed)
Controlled BP No known complications  Plan: 1. Continue Carvedilol 12.5mg  BID for now, no clear reason for BB without known CAD or MI/CVA, not indicated for HR control 2. Monitor BP outside office 3. Encouraged low sodium diet, regular exercise 4. Continue ASA 81 can reduce to daily 5. Follow-up 5 months for Annual physical labs - discuss alternative anti-HTN therapy such as ACEi/ARB next time given DM

## 2017-01-30 NOTE — Progress Notes (Signed)
Subjective:    Patient ID: Barry [name], male    DOB: 08-Oct-1946, 71 y.o.   MRN: [id number]  Barry [name] is a 71 y.o. male presenting on 01/30/2017 for Hypertension   HPI   CHRONIC HTN: Reports no concerns with BP has been controlled outside of office Current Meds - Carvedilol 12.5mg  BID   Reports good compliance, took meds today. Tolerating well, w/o complaints. - Additionally admits to some dry mouth, unsure if this is side effect of coreg, also states he snores at night, but not endorsing waking up with apnea episodes - Taking ASA 81mg  twice daily. No known prior MI or CVA or other vascular problem, unsure why taking BID - No significant changes with diet and exercise, he is limited by Right knee, and followed by Dr Earnestine Leys ([facility]) in July 2018 for further management Denies CP, dyspnea, HA, edema, dizziness / lightheadedness  HYPERLIPIDEMIA: - Reports no concerns. Last lipid panel 05/2016, controlled  - Currently taking Simvastatin 20mg  daily, tolerating well without side effects or myalgias  CHRONIC DM, Type 2: Reports no concerns Meds: None - diet controlled Not on ACEi/ARB Lifestyle: Diet (tries to improve DM diet) / Exercise (active with walking) Denies hypoglycemia, polyuria, visual changes, numbness or tingling.  Additional history BPH on [place] followed by [facility]  Additional social history - [place] Veteran, and admits prior exposure to [employer], has not had any known health problems due to this, and no longer followed by [place]   Social History  Substance Use Topics  . Smoking status: Never Smoker  . Smokeless tobacco: Former Systems developer    Types: Chew  . Alcohol use No    Review of Systems Per HPI unless specifically indicated above     Objective:    BP 136/74   Pulse 71   Temp 97.8 F (36.6 C) (Oral)   Resp 16   Ht 5\' 9"  (1.753 m)   Wt 189 lb (85.7 kg)   BMI 27.91 kg/m   Wt Readings from Last 3 Encounters:  01/30/17 189  lb (85.7 kg)  11/14/16 183 lb (83 kg)  08/30/16 184 lb 8 oz (83.7 kg)    Physical Exam  Constitutional: He is oriented to person, place, and time. He appears well-developed and well-nourished. No distress.  Well-appearing, comfortable, cooperative  HENT:  Head: Normocephalic and atraumatic.  Mouth/Throat: Oropharynx is clear and moist.  Eyes: Conjunctivae are normal.  Neck: Normal range of motion. Neck supple. No thyromegaly present.  No carotid bruits  Cardiovascular: Normal rate, regular rhythm, normal heart sounds and intact distal pulses.   No murmur heard. Pulmonary/Chest: Effort normal and breath sounds normal. No respiratory distress. He has no wheezes. He has no rales.  Lymphadenopathy:    He has no cervical adenopathy.  Neurological: He is alert and oriented to person, place, and time.  Skin: Skin is warm and dry. No rash noted. He is not diaphoretic. No erythema.  Psychiatric: He has a normal mood and affect. His behavior is normal.  Nursing note and vitals reviewed.    Results for orders placed or performed in visit on 11/14/16  POCT HgB A1C  Result Value Ref Range   Hemoglobin A1C 6.6%       Assessment & Plan:   Problem List Items Addressed This Visit    Hypercholesteremia    Stable, controlled on statin Last lipids 05/2016  Plan: 1. No change - continue simvastatin 20mg  daily 2. Check future fasting  lipids, next 5 months Annual Physical - Will calculated ASCVD risk 3. Reduce ASA from 81 BID to 81mg  daily for ASCVD primary risk reduction      Essential (primary) hypertension - Primary    Controlled BP No known complications  Plan: 1. Continue Carvedilol 12.5mg  BID for now, no clear reason for BB without known CAD or MI/CVA, not indicated for HR control 2. Monitor BP outside office 3. Encouraged low sodium diet, regular exercise 4. Continue ASA 81 can reduce to daily 5. Follow-up 5 months for Annual physical labs - discuss alternative anti-HTN therapy  such as ACEi/ARB next time given DM      Controlled type 2 diabetes mellitus without complication ([place])    Well-controlled at goal < 7.0, today A1c 6.6, prior 6.8 to 5.9 No complications or hypoglycemia.  Plan:  1. Remain diet controlled, encouraged continue improve DM diet and lifestyle, exercise 2. DM foot exam today normal 3. Cont ASA 81mg , statin 4. Follow-up 5 months A1c         No orders of the defined types were placed in this encounter.    Follow up plan: Return in about 5 months (around 06/17/2017) for Annual Physical.  Nobie Putnam, DO [facility] 01/30/2017, 11:16 PM

## 2017-04-24 DIAGNOSIS — L718 Other rosacea: Secondary | ICD-10-CM | POA: Diagnosis not present

## 2017-06-26 ENCOUNTER — Other Ambulatory Visit: Payer: PPO

## 2017-06-27 ENCOUNTER — Other Ambulatory Visit: Payer: PPO

## 2017-06-27 ENCOUNTER — Ambulatory Visit (INDEPENDENT_AMBULATORY_CARE_PROVIDER_SITE_OTHER): Payer: PPO

## 2017-06-27 VITALS — BP 130/64 | HR 74 | Temp 98.0°F | Resp 16 | Ht 69.0 in | Wt 186.4 lb

## 2017-06-27 DIAGNOSIS — Z0000 Encounter for general adult medical examination without abnormal findings: Secondary | ICD-10-CM | POA: Diagnosis not present

## 2017-06-27 DIAGNOSIS — I10 Essential (primary) hypertension: Secondary | ICD-10-CM | POA: Diagnosis not present

## 2017-06-27 DIAGNOSIS — E782 Mixed hyperlipidemia: Secondary | ICD-10-CM | POA: Diagnosis not present

## 2017-06-27 DIAGNOSIS — E119 Type 2 diabetes mellitus without complications: Secondary | ICD-10-CM | POA: Diagnosis not present

## 2017-06-27 NOTE — Patient Instructions (Signed)
Barry [name] , Thank you for taking time to come for your Medicare Wellness Visit. I appreciate your ongoing commitment to your health goals. Please review the following plan we discussed and let me know if I can assist you in the future.   Screening recommendations/referrals: Colonoscopy: completed 06/29/2014 Recommended yearly ophthalmology/optometry visit for glaucoma screening and checkup Recommended yearly dental visit for hygiene and checkup  Vaccinations: Influenza vaccine: due now- declined today  Pneumococcal vaccine: up to date  Tdap vaccine: up to date, due 06/2018  Shingles vaccine: up to date, please bring a copy of your immunization records  Advanced directives: Advance directive discussed with you today. You have declined information on this today   Conditions/risks identified: none  Next appointment: Follow up on 07/04/2017 at 10:00am with Dr.Karamalegos.Follow up in one year for your annual wellness exam.   Preventive Care 71 Years and Older, Male Preventive care refers to lifestyle choices and visits with your health care provider that can promote health and wellness. What does preventive care include?  A yearly physical exam. This is also called an annual well check.  Dental exams once or twice a year.  Routine eye exams. Ask your health care provider how often you should have your eyes checked.  Personal lifestyle choices, including:  Daily care of your teeth and gums.  Regular physical activity.  Eating a healthy diet.  Avoiding tobacco and drug use.  Limiting alcohol use.  Practicing safe sex.  Taking low doses of aspirin every day.  Taking vitamin and mineral supplements as recommended by your health care provider. What happens during an annual well check? The services and screenings done by your health care provider during your annual well check will depend on your age, overall health, lifestyle risk factors, and family history of  disease. Counseling  Your health care provider may ask you questions about your:  Alcohol use.  Tobacco use.  Drug use.  Emotional well-being.  Home and relationship well-being.  Sexual activity.  Eating habits.  History of falls.  Memory and ability to understand (cognition).  Work and work Statistician. Screening  You may have the following tests or measurements:  Height, weight, and BMI.  Blood pressure.  Lipid and cholesterol levels. These may be checked every 5 years, or more frequently if you are over 33 years old.  Skin check.  Lung cancer screening. You may have this screening every year starting at age 58 if you have a 30-pack-year history of smoking and currently smoke or have quit within the past 15 years.  Fecal occult blood test (FOBT) of the stool. You may have this test every year starting at age 80.  Flexible sigmoidoscopy or colonoscopy. You may have a sigmoidoscopy every 5 years or a colonoscopy every 10 years starting at age 66.  Prostate cancer screening. Recommendations will vary depending on your family history and other risks.  Hepatitis C blood test.  Hepatitis B blood test.  Sexually transmitted disease (STD) testing.  Diabetes screening. This is done by checking your blood sugar (glucose) after you have not eaten for a while (fasting). You may have this done every 1-3 years.  Abdominal aortic aneurysm (AAA) screening. You may need this if you are a current or former smoker.  Osteoporosis. You may be screened starting at age 10 if you are at high risk. Talk with your health care provider about your test results, treatment options, and if necessary, the need for more tests. Vaccines  Your health care  provider may recommend certain vaccines, such as:  Influenza vaccine. This is recommended every year.  Tetanus, diphtheria, and acellular pertussis (Tdap, Td) vaccine. You may need a Td booster every 10 years.  Zoster vaccine. You may  need this after age 60.  Pneumococcal 13-valent conjugate (PCV13) vaccine. One dose is recommended after age 1.  Pneumococcal polysaccharide (PPSV23) vaccine. One dose is recommended after age 37. Talk to your health care provider about which screenings and vaccines you need and how often you need them. This information is not intended to replace advice given to you by your health care provider. Make sure you discuss any questions you have with your health care provider. Document Released: 10/30/2015 Document Revised: 06/22/2016 Document Reviewed: 08/04/2015 Elsevier Interactive Patient Education  2017 [place] Prevention in the Home Falls can cause injuries. They can happen to people of all ages. There are many things you can do to make your home safe and to help prevent falls. What can I do on the outside of my home?  Regularly fix the edges of walkways and driveways and fix any cracks.  Remove anything that might make you trip as you walk through a door, such as a raised step or threshold.  Trim any bushes or trees on the path to your home.  Use bright outdoor lighting.  Clear any walking paths of anything that might make someone trip, such as rocks or tools.  Regularly check to see if handrails are loose or broken. Make sure that both sides of any steps have handrails.  Any raised decks and porches should have guardrails on the edges.  Have any leaves, snow, or ice cleared regularly.  Use sand or salt on walking paths during winter.  Clean up any spills in your garage right away. This includes oil or grease spills. What can I do in the bathroom?  Use night lights.  Install grab bars by the toilet and in the tub and shower. Do not use towel bars as grab bars.  Use non-skid mats or decals in the tub or shower.  If you need to sit down in the shower, use a plastic, non-slip stool.  Keep the floor dry. Clean up any water that spills on the floor as soon as it  happens.  Remove soap buildup in the tub or shower regularly.  Attach bath mats securely with double-sided non-slip rug tape.  Do not have throw rugs and other things on the floor that can make you trip. What can I do in the bedroom?  Use night lights.  Make sure that you have a light by your bed that is easy to reach.  Do not use any sheets or blankets that are too big for your bed. They should not hang down onto the floor.  Have a firm chair that has side arms. You can use this for support while you get dressed.  Do not have throw rugs and other things on the floor that can make you trip. What can I do in the kitchen?  Clean up any spills right away.  Avoid walking on wet floors.  Keep items that you use a lot in easy-to-reach places.  If you need to reach something above you, use a strong step stool that has a grab bar.  Keep electrical cords out of the way.  Do not use floor polish or wax that makes floors slippery. If you must use wax, use non-skid floor wax.  Do not have throw  rugs and other things on the floor that can make you trip. What can I do with my stairs?  Do not leave any items on the stairs.  Make sure that there are handrails on both sides of the stairs and use them. Fix handrails that are broken or loose. Make sure that handrails are as long as the stairways.  Check any carpeting to make sure that it is firmly attached to the stairs. Fix any carpet that is loose or worn.  Avoid having throw rugs at the top or bottom of the stairs. If you do have throw rugs, attach them to the floor with carpet tape.  Make sure that you have a light switch at the top of the stairs and the bottom of the stairs. If you do not have them, ask someone to add them for you. What else can I do to help prevent falls?  Wear shoes that:  Do not have high heels.  Have rubber bottoms.  Are comfortable and fit you well.  Are closed at the toe. Do not wear sandals.  If you  use a stepladder:  Make sure that it is fully opened. Do not climb a closed stepladder.  Make sure that both sides of the stepladder are locked into place.  Ask someone to hold it for you, if possible.  Clearly mark and make sure that you can see:  Any grab bars or handrails.  First and last steps.  Where the edge of each step is.  Use tools that help you move around (mobility aids) if they are needed. These include:  Canes.  Walkers.  Scooters.  Crutches.  Turn on the lights when you go into a dark area. Replace any light bulbs as soon as they burn out.  Set up your furniture so you have a clear path. Avoid moving your furniture around.  If any of your floors are uneven, fix them.  If there are any pets around you, be aware of where they are.  Review your medicines with your doctor. Some medicines can make you feel dizzy. This can increase your chance of falling. Ask your doctor what other things that you can do to help prevent falls. This information is not intended to replace advice given to you by your health care provider. Make sure you discuss any questions you have with your health care provider. Document Released: 07/30/2009 Document Revised: 03/10/2016 Document Reviewed: 11/07/2014 Elsevier Interactive Patient Education  2017 [employer].

## 2017-06-27 NOTE — Progress Notes (Signed)
Subjective:   Barry [name] is a 71 y.o. male who presents for Medicare Annual/Subsequent preventive examination.  Review of Systems:  Cardiac Risk Factors include: male gender;advanced age (>72men, >61 women);diabetes mellitus;dyslipidemia;hypertension     Objective:    Vitals: BP 130/64 (BP Location: Left Arm, Patient Position: Sitting)   Pulse 74   Temp 98 F (36.7 C)   Resp 16   Ht 5\' 9"  (1.753 m)   Wt 186 lb 6.4 oz (84.6 kg)   BMI 27.53 kg/m   Body mass index is 27.53 kg/m.  Tobacco History  Smoking Status  . Never Smoker  Smokeless Tobacco  . Former Systems developer  . Types: Chew     Counseling given: Not Answered   Past Medical History:  Diagnosis Date  . Arthritis   . BPH (benign prostatic hypertrophy)   . GERD (gastroesophageal reflux disease)   . Hyperlipidemia   . Hypertension    Past Surgical History:  Procedure Laterality Date  . CATARACT EXTRACTION W/PHACO Right 04/12/2016   Procedure: CATARACT EXTRACTION PHACO AND INTRAOCULAR LENS PLACEMENT (IOC);  Surgeon: Birder Robson, MD;  Location: [facility] ORS;  Service: Ophthalmology;  Laterality: Right;  [place]' 01:05 AP% 26.6 CDE 17.48 fluid pack lot # [id number]  . KNEE SURGERY Right 09/21/2012   Family History  Problem Relation Age of Onset  . Diabetes Mother   . Stroke Mother   . Prostate cancer Brother   . Melanoma Brother   . Hyperlipidemia Paternal Aunt    History  Sexual Activity  . Sexual activity: Not on file    Outpatient Encounter Prescriptions as of 06/27/2017  Medication Sig  . aspirin 81 MG tablet Take 81 mg by mouth 2 (two) times daily.  . carvedilol (COREG) 12.5 MG tablet Take 1 tablet (12.5 mg total) by mouth 2 (two) times daily with a meal.  . loratadine (CLARITIN) 10 MG tablet Take 1 tablet (10 mg total) by mouth daily. (Patient taking differently: Take 10 mg by mouth as needed. )  . Multiple Vitamin (MULTIVITAMIN WITH MINERALS) TABS tablet Take 1 tablet by mouth every other day.  .  ranitidine (ZANTAC) 150 MG capsule Take 150 mg by mouth daily as needed.   . silodosin (RAPAFLO) 8 MG CAPS capsule Take 8 mg by mouth daily with breakfast.   . simvastatin (ZOCOR) 20 MG tablet Take 1 tablet (20 mg total) by mouth daily.   No facility-administered encounter medications on file as of 06/27/2017.     Activities of Daily Living In your present state of health, do you have any difficulty performing the following activities: 06/27/2017 01/30/2017  Hearing? N N  Vision? N N  Difficulty concentrating or making decisions? Y N  Walking or climbing stairs? N Y  Dressing or bathing? N N  Doing errands, shopping? N N  Preparing Food and eating ? N -  Using the Toilet? N -  In the past six months, have you accidently leaked urine? N -  Do you have problems with loss of bowel control? N -  Managing your Medications? N -  Managing your Finances? N -  Housekeeping or managing your Housekeeping? N -  Some recent data might be hidden    Patient Care Team: Olin Hauser, DO as PCP - General (Family Medicine) Minna Merritts, MD as Consulting Physician (Cardiology)   Assessment:     Exercise Activities and Dietary recommendations Current Exercise Habits: The patient does not participate in regular exercise at present, Exercise  limited by: None identified  Goals    . Increase physical activity          Current activity is walking occasionally- limited due to knee.  Suggestions for walking- walking laps in a pool and suggested [employer].       Fall Risk Fall Risk  06/27/2017 08/09/2016 06/06/2016 07/28/2015 05/19/2015  Falls in the past year? No No No No Yes  Number falls in past yr: - - - - 1  Injury with Fall? - - - - No  Risk for fall due to : - - - - Impaired balance/gait  Follow up - - - - Falls prevention discussed   Depression Screen PHQ 2/9 Scores 06/27/2017 08/09/2016 06/06/2016 07/28/2015  PHQ - 2 Score 0 1 0 0    Cognitive Function MMSE -  Mini Mental State Exam 05/19/2015  Orientation to time 5  Orientation to Place 5  Registration 3  Attention/ Calculation 5  Recall 3  Language- name 2 objects 2  Language- repeat 1  Language- follow 3 step command 3  Language- read & follow direction 1  Write a sentence 1  Copy design 1  Total score 30     6CIT Screen 06/27/2017  What Year? 0 points  What month? 0 points  What time? 0 points  Count back from 20 0 points  Months in reverse 0 points  Repeat phrase 2 points  Total Score 2    Immunization History  Administered Date(s) Administered  . Influenza, High Dose Seasonal PF 07/28/2015  . Influenza-Unspecified 06/17/2014  . Pneumococcal Conjugate-13 05/07/2014  . Pneumococcal Polysaccharide-23 10/12/2011  . Pneumococcal-Unspecified 09/17/2011  . Tdap 05/17/2008   Screening Tests Health Maintenance  Topic Date Due  . FOOT EXAM  03/27/1956  . INFLUENZA VACCINE  07/17/2017 (Originally 05/17/2017)  . URINE MICROALBUMIN  08/09/2017 (Originally 03/27/1956)  . OPHTHALMOLOGY EXAM  08/15/2017 (Originally 04/16/2017)  . Hepatitis C Screening  06/17/2018 (Originally [birth year])  . HEMOGLOBIN A1C  12/25/2017  . TETANUS/TDAP  05/17/2018  . COLONOSCOPY  06/29/2024  . PNA vac Low Risk Adult  Completed      Plan:     I have personally reviewed and addressed the Medicare Annual Wellness questionnaire and have noted the following in the patient's chart:  A. Medical and social history B. Use of alcohol, tobacco or illicit drugs  C. Current medications and supplements D. Functional ability and status E.  Nutritional status F.  Physical activity G. Advance directives H. List of other physicians I.  Hospitalizations, surgeries, and ER visits in previous 12 months J.  [place] such as hearing and vision if needed, cognitive and depression L. Referrals and appointments - none  In addition, I have reviewed and discussed with patient certain preventive protocols, quality  metrics, and best practice recommendations. A written personalized care plan for preventive services as well as general preventive health recommendations were provided to patient.   Signed,  Tyler Aas, LPN Nurse Health Advisor   MD Recommendations: due for diabetic foot exam.

## 2017-06-28 LAB — COMPLETE METABOLIC PANEL WITH GFR
AG Ratio: 2.3 (calc) (ref 1.0–2.5)
ALT: 15 U/L (ref 9–46)
AST: 17 U/L (ref 10–35)
Albumin: 4.3 g/dL (ref 3.6–5.1)
Alkaline phosphatase (APISO): 74 U/L (ref 40–115)
BUN: 15 mg/dL (ref 7–25)
CALCIUM: 8.9 mg/dL (ref 8.6–10.3)
CHLORIDE: 106 mmol/L (ref 98–110)
CO2: 25 mmol/L (ref 20–32)
Creat: 0.93 mg/dL (ref 0.70–1.18)
GFR, EST AFRICAN AMERICAN: 95 mL/min/{1.73_m2} (ref >=60)
GFR, Est Non African American: 82 mL/min/{1.73_m2} (ref >=60)
GLUCOSE: 119 mg/dL — AB (ref 65–99)
Globulin: 1.9 g/dL (calc) (ref 1.9–3.7)
Potassium: 3.7 mmol/L (ref 3.5–5.3)
Sodium: 141 mmol/L (ref 135–146)
TOTAL PROTEIN: 6.2 g/dL (ref 6.1–8.1)
Total Bilirubin: 0.7 mg/dL (ref 0.2–1.2)

## 2017-06-28 LAB — CBC WITH DIFFERENTIAL/PLATELET
BASOS ABS: 39 {cells}/uL (ref 0–200)
Basophils Relative: 0.6 %
EOS ABS: 163 {cells}/uL (ref 15–500)
Eosinophils Relative: 2.5 %
HCT: 40.2 % (ref 38.5–50.0)
Hemoglobin: 14.1 g/dL (ref 13.2–17.1)
Lymphs Abs: 1768 cells/uL (ref 850–3900)
MCH: 31 pg (ref 27.0–33.0)
MCHC: 35.1 g/dL (ref 32.0–36.0)
MCV: 88.4 fL (ref 80.0–100.0)
MONOS PCT: 8.8 %
MPV: 9.9 fL (ref 7.5–12.5)
NEUTROS PCT: 60.9 %
Neutro Abs: 3959 cells/uL (ref 1500–7800)
PLATELETS: 201 10*3/uL (ref 140–400)
RBC: 4.55 10*6/uL (ref 4.20–5.80)
RDW: 12.5 % (ref 11.0–15.0)
TOTAL LYMPHOCYTE: 27.2 %
WBC mixed population: 572 cells/uL (ref 200–950)
WBC: 6.5 10*3/uL (ref 3.8–10.8)

## 2017-06-28 LAB — HEMOGLOBIN A1C
HEMOGLOBIN A1C: 6 %{Hb} — AB (ref <5.7)
MEAN PLASMA GLUCOSE: 126 (calc)
eAG (mmol/L): 7 (calc)

## 2017-06-28 LAB — LIPID PANEL
Cholesterol: 126 mg/dL (ref <200)
HDL: 40 mg/dL — ABNORMAL LOW (ref >40)
LDL Cholesterol (Calc): 69 mg/dL (calc)
Non-HDL Cholesterol (Calc): 86 mg/dL (calc) (ref <130)
Total CHOL/HDL Ratio: 3.2 (calc) (ref <5.0)
Triglycerides: 89 mg/dL (ref <150)

## 2017-07-04 ENCOUNTER — Other Ambulatory Visit: Payer: Self-pay | Admitting: Family Medicine

## 2017-07-04 ENCOUNTER — Ambulatory Visit (INDEPENDENT_AMBULATORY_CARE_PROVIDER_SITE_OTHER): Payer: PPO | Admitting: Family Medicine

## 2017-07-04 ENCOUNTER — Encounter: Payer: Self-pay | Admitting: Family Medicine

## 2017-07-04 VITALS — BP 128/71 | HR 74 | Temp 98.3°F | Resp 16 | Ht 69.0 in | Wt 185.0 lb

## 2017-07-04 DIAGNOSIS — Z8042 Family history of malignant neoplasm of prostate: Secondary | ICD-10-CM

## 2017-07-04 DIAGNOSIS — R350 Frequency of micturition: Secondary | ICD-10-CM

## 2017-07-04 DIAGNOSIS — N401 Enlarged prostate with lower urinary tract symptoms: Secondary | ICD-10-CM

## 2017-07-04 DIAGNOSIS — L719 Rosacea, unspecified: Secondary | ICD-10-CM | POA: Diagnosis not present

## 2017-07-04 DIAGNOSIS — Z125 Encounter for screening for malignant neoplasm of prostate: Secondary | ICD-10-CM | POA: Diagnosis not present

## 2017-07-04 DIAGNOSIS — I10 Essential (primary) hypertension: Secondary | ICD-10-CM | POA: Diagnosis not present

## 2017-07-04 DIAGNOSIS — E119 Type 2 diabetes mellitus without complications: Secondary | ICD-10-CM

## 2017-07-04 DIAGNOSIS — E7800 Pure hypercholesterolemia, unspecified: Secondary | ICD-10-CM | POA: Diagnosis not present

## 2017-07-04 LAB — POCT UA - MICROALBUMIN: MICROALBUMIN (UR) POC: 20 mg/L

## 2017-07-04 MED ORDER — SIMVASTATIN 20 MG PO TABS: 20.0000 mg | ORAL_TABLET | Freq: Every day | ORAL | 3 refills | Status: DC

## 2017-07-04 MED ORDER — CARVEDILOL 12.5 MG PO TABS: 12.5000 mg | ORAL_TABLET | Freq: Two times a day (BID) | ORAL | 3 refills | Status: DC

## 2017-07-04 MED ORDER — SILODOSIN 8 MG PO CAPS: 8.0000 mg | ORAL_CAPSULE | Freq: Every day | ORAL | 11 refills | Status: DC

## 2017-07-04 NOTE — Progress Notes (Signed)
Subjective:    Patient ID: Barry [name], male    DOB: [birth year], 71 y.o.   MRN: [id number]  Barry [name] is a 71 y.o. male presenting on 07/04/2017 for Annual Exam  HPI   Here for Annual Physical and Lab Review  CHRONIC DM, Type 2: Reports no concerns. Pleased with improved A1c Meds: None - diet controlled Currently not on ACEi/ARB - due for microalbumin Lifestyle: - Diet (improved DM diet)  - Exercise (active, walking) - Followed by [facility], due in 07/2017, Dr Linton Flemings s/p cataract surgery, 03/2016 Denies hypoglycemia  CHRONIC HTN: Reports no new concerns Current Meds - Carvedilol 12.5mg  BID   Reports good compliance, took meds today. Tolerating well, w/o complaints.  HYPERLIPIDEMIA: - Reports no concerns. Last lipid panel 06/2017, controlled  - Currently taking Simvastatin 20mg , tolerating well without side effects or myalgias - On ASA 81mg  daily  Rosacea - Followed by [facility], Dr Evorn Gong, on Doxycyline oral 100mg  daily for 3 months  BPH with LUTS / Prostate Cancer Screening - Previously followed by [facility]rology - Dr Bernardo Heater, last visit 11/2016, has had normal screening for prostate cancer, with PSA 0.7 to 0.61. Prior DRE reportedly normal in 11/2016. He has family history of prostate cancer with brother. - History of BPH with some LUTS, has been controlled on Rapaflo 8mg  daily, see symptom score below - Stays well hydrated with plenty of water  AUA BPH Symptom Score over past 1 month 1. Sensation of not emptying bladder post void - 0 2. Urinate less than 2 hour after finish last void - 1 3. Start/Stop several times during void - 1 4. Difficult to postpone urination - 4 5. Weak urinary stream - 2 6. Push or strain urination - 0 7. Nocturia - 2 times  Total Score: 10 (Moderate BPH symptoms)  Additional history - has some excessive sweating, asking if prior heat stroke 3-4 years ago is contributing  Health Maintenance: - Due for Flu Shot,  will return in October - UTD Pneumonia vaccine - Declined Hep C routine screen - UTD Colonoscopy  Past Medical History:  Diagnosis Date  . Arthritis   . BPH (benign prostatic hypertrophy)   . GERD (gastroesophageal reflux disease)   . Hyperlipidemia   . Hypertension    Past Surgical History:  Procedure Laterality Date  . CATARACT EXTRACTION W/PHACO Right 04/12/2016   Procedure: CATARACT EXTRACTION PHACO AND INTRAOCULAR LENS PLACEMENT (IOC);  Surgeon: Birder Robson, MD;  Location: [facility] ORS;  Service: Ophthalmology;  Laterality: Right;  [place]' 01:05 AP% 26.6 CDE 17.48 fluid pack lot # [id number]  . KNEE SURGERY Right 09/21/2012   Social History   Social History  . Marital status: Married    Spouse name: N/A  . Number of children: N/A  . Years of education: N/A   Occupational History  . Not on file.   Social History Main Topics  . Smoking status: Never Smoker  . Smokeless tobacco: Former Systems developer    Types: Chew  . Alcohol use No  . Drug use: No  . Sexual activity: Not on file   Other Topics Concern  . Not on file   Social History Narrative  . No narrative on file   Family History  Problem Relation Age of Onset  . Diabetes Mother   . Stroke Mother   . Prostate cancer Brother   . Melanoma Brother   . Hyperlipidemia Paternal Aunt    Current Outpatient Prescriptions on File Prior to  Visit  Medication Sig  . aspirin 81 MG tablet Take 81 mg by mouth 2 (two) times daily.  Marland Kitchen loratadine (CLARITIN) 10 MG tablet Take 1 tablet (10 mg total) by mouth daily. (Patient taking differently: Take 10 mg by mouth as needed. )  . Multiple Vitamin (MULTIVITAMIN WITH MINERALS) TABS tablet Take 1 tablet by mouth every other day.  . ranitidine (ZANTAC) 150 MG capsule Take 150 mg by mouth daily as needed.    No current facility-administered medications on file prior to visit.     Review of Systems  Constitutional: Negative for activity change, appetite change, chills, diaphoresis,  fatigue and fever.  HENT: Negative for congestion, hearing loss and sinus pressure.   Eyes: Negative for visual disturbance.  Respiratory: Negative for apnea, cough, chest tightness, shortness of breath and wheezing.   Cardiovascular: Negative for chest pain, palpitations and leg swelling.  Gastrointestinal: Negative for abdominal pain, anal bleeding, blood in stool, constipation, diarrhea, nausea and vomiting.  Endocrine: Negative for cold intolerance and polyuria.       Increased sweating  Genitourinary: Positive for urgency (occasional). Negative for decreased urine volume, difficulty urinating, dysuria, frequency and hematuria.  Musculoskeletal: Negative for arthralgias and neck pain.  Skin: Negative for rash.  Allergic/Immunologic: Negative for environmental allergies.  Neurological: Negative for dizziness, weakness, light-headedness, numbness and headaches.  Hematological: Negative for adenopathy.  Psychiatric/Behavioral: Negative for behavioral problems, dysphoric mood and sleep disturbance. The patient is not nervous/anxious.    Per HPI unless specifically indicated above     Objective:    BP 128/71   Pulse 74   Temp 98.3 F (36.8 C) (Oral)   Resp 16   Ht 5\' 9"  (1.753 m)   Wt 185 lb (83.9 kg)   BMI 27.32 kg/m   Wt Readings from Last 3 Encounters:  07/04/17 185 lb (83.9 kg)  06/27/17 186 lb 6.4 oz (84.6 kg)  01/30/17 189 lb (85.7 kg)    Physical Exam  Constitutional: He is oriented to person, place, and time. He appears well-developed and well-nourished. No distress.  Well-appearing, comfortable, cooperative  HENT:  Head: Normocephalic and atraumatic.  Mouth/Throat: Oropharynx is clear and moist.  Eyes: Pupils are equal, round, and reactive to light. Conjunctivae and EOM are normal. Right eye exhibits no discharge. Left eye exhibits no discharge.  Neck: Normal range of motion. Neck supple. No thyromegaly present.  Cardiovascular: Normal rate, regular rhythm, normal  heart sounds and intact distal pulses.   No murmur heard. Pulmonary/Chest: Effort normal and breath sounds normal. No respiratory distress. He has no wheezes. He has no rales.  Abdominal: Soft. Bowel sounds are normal. He exhibits no distension and no mass. There is no tenderness.  Genitourinary:  Genitourinary Comments: Defer DRE today  Musculoskeletal: Normal range of motion. He exhibits no edema or tenderness.  Upper / Lower Extremities: - Normal muscle tone, strength bilateral upper extremities 5/5, lower extremities 5/5  Lymphadenopathy:    He has no cervical adenopathy.  Neurological: He is alert and oriented to person, place, and time.  Distal sensation intact to light touch all extremities  Skin: Skin is warm and dry. No rash noted. He is not diaphoretic. No erythema.  Psychiatric: He has a normal mood and affect. His behavior is normal.  Well groomed, good eye contact, normal speech and thoughts  Nursing note and vitals reviewed.    Diabetic Foot Exam - Simple   Simple Foot Form Diabetic Foot exam was performed with the following findings:  Yes 07/04/2017 12:20 PM  Visual Inspection No deformities, no ulcerations, no other skin breakdown bilaterally:  Yes Sensation Testing Intact to touch and monofilament testing bilaterally:  Yes Pulse Check Posterior Tibialis and Dorsalis pulse intact bilaterally:  Yes Comments    Results for orders placed or performed in visit on 07/04/17  POCT UA - Microalbumin  Result Value Ref Range   Microalbumin Ur, POC 20 mg/L   Creatinine, POC  mg/dL   Albumin/Creatinine Ratio, Urine, POC      Recent Labs  11/14/16 1122 06/27/17 0834  HGBA1C 6.6% 6.0*        Assessment & Plan:   Problem List Items Addressed This Visit    Screening for prostate cancer    Avg / Higher risk patient, fam history brother prostate CA. With prior reported negative screening - Last PSA 0.61 (11/2016), prior 0.7 - Last DRE normal years ago reported  normal - Clinically BPH  Plan: 1. Reviewed prostate cancer screening guidelines and risks including potential prostate biopsy if abnormal PSA, proceed with yearly PSA for now - NEXT due in 11/2017, and anticipate DRE as needed especially if abnormal PSA or new symptoms      Rosacea    Stable, now improved on doxycycline Followedby Dermatology      Hypercholesteremia    Controlled cholesterol on statin and lifestyle Last lipid panel 06/2017 Calculated ASCVD 10 yr risk score elevated  Plan: 1. Continue current meds - Simvastatin 20mg  daily 2. Continue ASA 81mg  for primary ASCVD risk reduction 3. Encourage improved lifestyle - low carb/cholesterol, reduce portion size, continue improving regular exercise 4. Follow-up 6 months      Relevant Medications   carvedilol (COREG) 12.5 MG tablet   simvastatin (ZOCOR) 20 MG tablet   Family history of malignant neoplasm of prostate   Essential (primary) hypertension - Primary    Controlled BP No known complications  Plan: 1. Continue Carvedilol 12.5mg  BID for now, no clear reason for BB without known CAD or MI/CVA, not indicated for HR control 2. Continue monitor BP outside office 3. Encouraged low sodium diet, regular exercise 4. Continue ASA 81 can reduce to daily 5. Follow-up 6 months - discuss alternative anti-HTN therapy such as ACEi/ARB next time given DM      Relevant Medications   carvedilol (COREG) 12.5 MG tablet   simvastatin (ZOCOR) 20 MG tablet   Controlled type 2 diabetes mellitus without complication (HCC)    Well-controlled at goal, improved from 6.6 to 6.0 now No complications or hypoglycemia.  Plan:  1. Remain diet controlled, encouraged continue improve DM diet and lifestyle, exercise 2. Cont ASA 81mg , statin 3. Urine microalbumin normal today - future consider ACEi/ARB 4. Due for DM eye - advised 5. Follow-up 6 months A1c      Relevant Medications   simvastatin (ZOCOR) 20 MG tablet   Other Relevant Orders    POCT UA - Microalbumin (Completed)   Benign prostatic hyperplasia with lower urinary tract symptoms    Stable chronic BPH with lower urinary tract symptoms (LUTS) without any evidence of obstruction. - AUA BPH score 10 - On Rapaflo 8mg  - Last PSA 0.61 (11/2016), prior stable PSA around 0.7, without abnormal - Last DRE reported normal 11/2016 per [facility] - No known personal/family history of prostate CA  Plan: 1. Continue Rapaflo 8mg  daily for now - given AUA BPH score 2. Will continue to follow PSA in future - next due 11/2017 - future orders placed, no longer following with  Urology at this time, consider resume BUA if needed      Relevant Medications   silodosin (RAPAFLO) 8 MG CAPS capsule      Meds ordered this encounter  Medications  . doxycycline (VIBRAMYCIN) 100 MG capsule  . silodosin (RAPAFLO) 8 MG CAPS capsule    Sig: Take 1 capsule (8 mg total) by mouth daily with breakfast.    Dispense:  30 capsule    Refill:  11  . carvedilol (COREG) 12.5 MG tablet    Sig: Take 1 tablet (12.5 mg total) by mouth 2 (two) times daily with a meal.    Dispense:  180 tablet    Refill:  3  . simvastatin (ZOCOR) 20 MG tablet    Sig: Take 1 tablet (20 mg total) by mouth daily.    Dispense:  90 tablet    Refill:  3    Follow up plan: Return in about 6 months (around 01/01/2018) for DM A1c, PSA, HTN.  Future labs ordered.  Nobie Putnam, [facility] 07/04/2017, 9:41 PM

## 2017-07-04 NOTE — Patient Instructions (Addendum)
Thank you for coming to the clinic today.  1. Keep up the great work  2. Return for flu shot for nurse visit in 2-4 weeks  3. Refilled medicines, continue curretn regimen  4. Check urine today for kidney function  5. May follow-up with Dr Rockey Situ as planned this winter  DUE for NON Fasting BLOOD WORK  SCHEDULE "Lab Only" visit in the morning at the clinic for lab draw in 6* MONTHS  - Make sure Lab Only appointment is at about 1 week before your next appointment, so that results will be available  For Lab Results, once available within 2-3 days of blood draw, you can can log in to MyChart online to view your results and a brief explanation. Also, we can discuss results at next follow-up visit.   Please schedule a Follow-up Appointment to: Return in about 6 months (around 01/01/2018) for DM A1c, PSA, HTN.  If you have any other questions or concerns, please feel free to call the clinic or send a message through [place]. You may also schedule an earlier appointment if necessary.  Additionally, you may be receiving a survey about your experience at our clinic within a few days to 1 week by e-mail or mail. We value your feedback.  Nobie Putnam, DO [place]

## 2017-07-04 NOTE — Assessment & Plan Note (Signed)
Stable chronic BPH with lower urinary tract symptoms (LUTS) without any evidence of obstruction. - AUA BPH score 10 - On Rapaflo 8mg  - Last PSA 0.61 (11/2016), prior stable PSA around 0.7, without abnormal - Last DRE reported normal 11/2016 per [facility] - No known personal/family history of prostate CA  Plan: 1. Continue Rapaflo 8mg  daily for now - given AUA BPH score 2. Will continue to follow PSA in future - next due 11/2017 - future orders placed, no longer following with Urology at this time, consider resume BUA if needed

## 2017-07-04 NOTE — Assessment & Plan Note (Signed)
Stable, now improved on doxycycline [facility]

## 2017-07-04 NOTE — Assessment & Plan Note (Addendum)
Well-controlled at goal, improved from 6.6 to 6.0 now No complications or hypoglycemia.  Plan:  1. Remain diet controlled, encouraged continue improve DM diet and lifestyle, exercise 2. Cont ASA 81mg , statin 3. Urine microalbumin normal today - future consider ACEi/ARB 4. Due for DM eye - advised 5. Follow-up 6 months A1c

## 2017-07-04 NOTE — Assessment & Plan Note (Signed)
Controlled BP No known complications  Plan: 1. Continue Carvedilol 12.5mg  BID for now, no clear reason for BB without known CAD or MI/CVA, not indicated for HR control 2. Continue monitor BP outside office 3. Encouraged low sodium diet, regular exercise 4. Continue ASA 81 can reduce to daily 5. Follow-up 6 months - discuss alternative anti-HTN therapy such as ACEi/ARB next time given DM

## 2017-07-04 NOTE — Assessment & Plan Note (Signed)
Avg / Higher risk patient, fam history brother prostate CA. With prior reported negative screening - Last PSA 0.61 (11/2016), prior 0.7 - Last DRE normal years ago reported normal - Clinically BPH  Plan: 1. Reviewed prostate cancer screening guidelines and risks including potential prostate biopsy if abnormal PSA, proceed with yearly PSA for now - NEXT due in 11/2017, and anticipate DRE as needed especially if abnormal PSA or new symptoms

## 2017-07-04 NOTE — Assessment & Plan Note (Signed)
Controlled cholesterol on statin and lifestyle Last lipid panel 06/2017 Calculated ASCVD 10 yr risk score elevated  Plan: 1. Continue current meds - Simvastatin 20mg  daily 2. Continue ASA 81mg  for primary ASCVD risk reduction 3. Encourage improved lifestyle - low carb/cholesterol, reduce portion size, continue improving regular exercise 4. Follow-up 6 months

## 2017-08-01 DIAGNOSIS — D225 Melanocytic nevi of trunk: Secondary | ICD-10-CM | POA: Diagnosis not present

## 2017-08-01 DIAGNOSIS — L570 Actinic keratosis: Secondary | ICD-10-CM | POA: Diagnosis not present

## 2017-08-01 DIAGNOSIS — L718 Other rosacea: Secondary | ICD-10-CM | POA: Diagnosis not present

## 2017-08-01 DIAGNOSIS — X32XXXA Exposure to sunlight, initial encounter: Secondary | ICD-10-CM | POA: Diagnosis not present

## 2017-08-01 DIAGNOSIS — D2261 Melanocytic nevi of right upper limb, including shoulder: Secondary | ICD-10-CM | POA: Diagnosis not present

## 2017-08-01 DIAGNOSIS — D2272 Melanocytic nevi of left lower limb, including hip: Secondary | ICD-10-CM | POA: Diagnosis not present

## 2017-10-25 DIAGNOSIS — H0012 Chalazion right lower eyelid: Secondary | ICD-10-CM | POA: Diagnosis not present

## 2017-11-13 DIAGNOSIS — H0014 Chalazion left upper eyelid: Secondary | ICD-10-CM | POA: Diagnosis not present

## 2017-12-25 ENCOUNTER — Other Ambulatory Visit: Payer: PPO

## 2017-12-25 DIAGNOSIS — Z8042 Family history of malignant neoplasm of prostate: Secondary | ICD-10-CM

## 2017-12-25 DIAGNOSIS — N401 Enlarged prostate with lower urinary tract symptoms: Secondary | ICD-10-CM | POA: Diagnosis not present

## 2017-12-25 DIAGNOSIS — R350 Frequency of micturition: Principal | ICD-10-CM

## 2017-12-25 DIAGNOSIS — E119 Type 2 diabetes mellitus without complications: Secondary | ICD-10-CM

## 2017-12-26 LAB — HEMOGLOBIN A1C
HEMOGLOBIN A1C: 6.4 %{Hb} — AB (ref <5.7)
MEAN PLASMA GLUCOSE: 137 (calc)
eAG (mmol/L): 7.6 (calc)

## 2017-12-26 LAB — PSA, TOTAL WITH REFLEX TO PSA, FREE: PSA, Total: 0.7 ng/mL (ref <=4.0)

## 2018-01-01 ENCOUNTER — Telehealth: Payer: Self-pay | Admitting: Family Medicine

## 2018-01-01 ENCOUNTER — Ambulatory Visit (INDEPENDENT_AMBULATORY_CARE_PROVIDER_SITE_OTHER): Payer: PPO | Admitting: Family Medicine

## 2018-01-01 ENCOUNTER — Other Ambulatory Visit: Payer: Self-pay | Admitting: Family Medicine

## 2018-01-01 ENCOUNTER — Encounter: Payer: Self-pay | Admitting: Family Medicine

## 2018-01-01 VITALS — BP 141/78 | HR 67 | Temp 97.8°F | Resp 16 | Ht 69.0 in | Wt 187.0 lb

## 2018-01-01 DIAGNOSIS — R350 Frequency of micturition: Secondary | ICD-10-CM | POA: Diagnosis not present

## 2018-01-01 DIAGNOSIS — Z125 Encounter for screening for malignant neoplasm of prostate: Secondary | ICD-10-CM

## 2018-01-01 DIAGNOSIS — E119 Type 2 diabetes mellitus without complications: Secondary | ICD-10-CM | POA: Diagnosis not present

## 2018-01-01 DIAGNOSIS — Z0000 Encounter for general adult medical examination without abnormal findings: Secondary | ICD-10-CM

## 2018-01-01 DIAGNOSIS — N401 Enlarged prostate with lower urinary tract symptoms: Secondary | ICD-10-CM | POA: Diagnosis not present

## 2018-01-01 DIAGNOSIS — I10 Essential (primary) hypertension: Secondary | ICD-10-CM

## 2018-01-01 DIAGNOSIS — E7800 Pure hypercholesterolemia, unspecified: Secondary | ICD-10-CM

## 2018-01-01 MED ORDER — TAMSULOSIN HCL 0.4 MG PO CAPS: 0.4000 mg | ORAL_CAPSULE | Freq: Every day | ORAL | 5 refills | Status: DC

## 2018-01-01 NOTE — Telephone Encounter (Signed)
Patient advised.

## 2018-01-01 NOTE — Telephone Encounter (Signed)
Pt said his insurance will pay for tamsulosin.  Please send prescription to [facility].  His call back number is [phone]

## 2018-01-01 NOTE — Assessment & Plan Note (Signed)
Stable chronic BPH with lower urinary tract symptoms (LUTS) without any evidence of obstruction. - AUA BPH score 10, unchanged from last visit 10 - On Rapaflo 8mg  - Last PSA 0.61 > 0.7 (12/2017) - Last DRE reported normal 11/2016 per [facility] - No known personal/family history of prostate CA  Plan: 1. Due to formulary change will need to switch off brand Rapaflo d/t cost - he will check with insurance/pharmacy to see cost of Silodosin 8mg  daily generic version - if still costly and tier 4 he will notify us and we will switch to Tamsulosin 0.4mg  daily - lower dose for now until tolerated then may inc to 0.8mg  daily in future if needs higher dose, or he will notify us to send Silodosin 8mg  daily generic 2. Will continue to follow PSA prostate screening in future - next due 12/2018 (will skip upcoming 06/2018 and 12/2018 to get it at a yearly interval) , may return to urology if need in future, will check DRE at next visit annual

## 2018-01-01 NOTE — Telephone Encounter (Signed)
Please notify patient.  Sent new rx Tamsulosin 0.4mg  daily - Stop Rapaflo (Silodosin) due to no longer covered by insurance.  If it is not as effective as the previous medicine, after about 4-6 weeks, he may call us back and we can double his dose to TWO pills of Tamsulosin at once daily.  Nobie Putnam, [facility] 01/01/2018, 4:22 PM

## 2018-01-01 NOTE — Patient Instructions (Addendum)
Thank you for coming to the office today.  1.  For Prostate - it sounds like medicine is working very well. I recommend continuing some form of prostate medicine, PSA is low 0.7, do not need Urology again at this time.  Since Rapaflo 8mg  is no longer preferred or covered. Please call your insurance plan to check with Benefits Manager to see if the generic - Silodosin 8mg  daily would be covered or at a more reasonable cost - if it is then let me know and I can send a new rx for that one. Otherwise I would recommend the Tamsulosin 0.4mg  once daily - and if needed would gradually increase if needed for two pills = 0.8mg  at the top dose, just to avoid potential side effects  DUE for FASTING BLOOD WORK (no food or drink after midnight before the lab appointment, only water or coffee without cream/sugar on the morning of)  SCHEDULE "Lab Only" visit in the morning at the clinic for lab draw in 6 MONTHS   - Make sure Lab Only appointment is at about 1 week before your next appointment, so that results will be available  For Lab Results, once available within 2-3 days of blood draw, you can can log in to MyChart online to view your results and a brief explanation. Also, we can discuss results at next follow-up visit.   Please schedule a Follow-up Appointment to: Return in about 6 months (around 07/04/2018) for Annual Physical.    If you have any other questions or concerns, please feel free to call the office or send a message through [place]. You may also schedule an earlier appointment if necessary.  Additionally, you may be receiving a survey about your experience at our office within a few days to 1 week by e-mail or mail. We value your feedback.  Nobie Putnam, DO [place]

## 2018-01-01 NOTE — Assessment & Plan Note (Signed)
Well-controlled at goal, A1c 6.4, from 6.0 but improved from 6.6, still at goal < 7 No complications or hypoglycemia. Last urine microalbumin negative  Plan:  1. Remain diet controlled, encouraged continue improve DM diet and lifestyle, exercise 2. Cont ASA 81mg , statin 3. Defer future consider ACEi/ARB  4. We will contact [facility] for record of DM eye from 07/2017 5. Follow-up 6 months Annual phys + labs a1c

## 2018-01-01 NOTE — Progress Notes (Signed)
Subjective:    Patient ID: Barry [name], male    DOB: 11-03-1945, 72 y.o.   MRN: [id number]  Barry [name] is a 72 y.o. male presenting on 01/01/2018 for Hypertension and Diabetes   HPI   CHRONIC DM, Type 2: Recent labs showed A1c 6.4 up from 6.0, relatively stable compared to past. He says trying to work on improving diet but his knee still bothers him and limiting his activity Meds: None - diet controlled Currently not on ACEi/ARB - due for microalbumin Lifestyle: - Weight up 1 lb in 6 months - Diet (improved DM diet - still working on this) - Exercise (active, walking but now less often due to knee bothering him) - Followed by [facility], last visit eye exam in 07/2017 we do not have copy of record Denies hypoglycemia, polyuria, visual changes, numbness or tingling.  CHRONIC HTN: Reports no new concerns Current Meds - Carvedilol 12.5mg  BID   Reports good compliance, took meds today. Tolerating well, w/o complaints.  BPH with LUTS / Prostate Cancer Screening - Previously followed by [facility] - Dr Bernardo Heater, last visit 11/2016, has had normal screening for prostate cancer in past including DRE and PSA in 2018. - Last result from our office 12/25/17 with PSA 0.7, negative - He does not plan to return to Urology, considered transfer to [facility] but he will stay with PCP for now on prostate screening - He was doing well controlled BPH on Rapaflo brand name 8mg  daily for while, until now he just received letter from insurance stating that Rapaflo 8mg  is no longer covered and not on formulary, there is generic available now Silodosin 8mg  - starting in 01/15/18, also he has list of formulary and lowest tier would be tamsulosin, he is asking about lower cost options - Stays well hydrated with plenty of water  Repeat score today 01/01/18 - unchanged from prior score [facility]UA BPH Symptom Score over past 1 month 1. Sensation of not emptying bladder post void - 0 2. Urinate less than 2 hour  after finish last void - 1 3. Start/Stop several times during void - 1 4. Difficult to postpone urination - 4 5. Weak urinary stream - 2 6. Push or strain urination - 0 7. Nocturia - 2 times  Total Score: 10 (Moderate BPH symptoms)  Depression screen [facility] 2/9 01/01/2018 06/27/2017 08/09/2016  Decreased Interest 0 0 1  Down, Depressed, Hopeless 0 0 0  PHQ - 2 Score 0 0 1    Social History   Tobacco Use  . Smoking status: Never Smoker  . Smokeless tobacco: Former Systems developer    Types: Chew  Substance Use Topics  . Alcohol use: No  . Drug use: No    Review of Systems Per HPI unless specifically indicated above     Objective:    BP (!) 141/78   Pulse 67   Temp 97.8 F (36.6 C) (Oral)   Resp 16   Ht 5\' 9"  (1.753 m)   Wt 187 lb (84.8 kg)   BMI 27.62 kg/m   Wt Readings from Last 3 Encounters:  01/01/18 187 lb (84.8 kg)  07/04/17 185 lb (83.9 kg)  06/27/17 186 lb 6.4 oz (84.6 kg)    Physical Exam  Constitutional: He is oriented to person, place, and time. He appears well-developed and well-nourished. No distress.  Well-appearing, comfortable, cooperative  HENT:  Head: Normocephalic and atraumatic.  Mouth/Throat: Oropharynx is clear and moist.  Eyes: Conjunctivae are normal. Right  eye exhibits no discharge. Left eye exhibits no discharge.  Neck: Normal range of motion. Neck supple. No thyromegaly present.  Cardiovascular: Normal rate, regular rhythm, normal heart sounds and intact distal pulses.  No murmur heard. Pulmonary/Chest: Effort normal and breath sounds normal. No respiratory distress. He has no wheezes. He has no rales.  Musculoskeletal: Normal range of motion. He exhibits no edema.  Lymphadenopathy:    He has no cervical adenopathy.  Neurological: He is alert and oriented to person, place, and time.  Skin: Skin is warm and dry. No rash noted. He is not diaphoretic. No erythema.  Psychiatric: He has a normal mood and affect. His behavior is normal.  Well groomed,  good eye contact, normal speech and thoughts  Nursing note and vitals reviewed.  Results for orders placed or performed in visit on 12/25/17  PSA, Total with Reflex to PSA, Free  Result Value Ref Range   PSA, Total 0.7 < OR = 4.0 ng/mL  Hemoglobin A1c  Result Value Ref Range   Hgb A1c MFr Bld 6.4 (H) <5.7 % of total Hgb   Mean Plasma Glucose 137 (calc)   eAG (mmol/L) 7.6 (calc)   Recent Labs    06/27/17 0834 12/25/17 0815  HGBA1C 6.0* 6.4*       Assessment & Plan:   Problem List Items Addressed This Visit    Benign prostatic hyperplasia with lower urinary tract symptoms    Stable chronic BPH with lower urinary tract symptoms (LUTS) without any evidence of obstruction. - [facility] BPH score 10, unchanged from last visit 10 - On Rapaflo 8mg  - Last PSA 0.61 > 0.7 (12/2017) - Last DRE reported normal 11/2016 per [facility] - No known personal/family history of prostate CA  Plan: 1. Due to formulary change will need to switch off brand Rapaflo d/t cost - he will check with insurance/pharmacy to see cost of Silodosin 8mg  daily generic version - if still costly and tier 4 he will notify us and we will switch to Tamsulosin 0.4mg  daily - lower dose for now until tolerated then may inc to 0.8mg  daily in future if needs higher dose, or he will notify us to send Silodosin 8mg  daily generic 2. Will continue to follow PSA prostate screening in future - next due 12/2018 (will skip upcoming 06/2018 and 12/2018 to get it at a yearly interval) , may return to urology if need in future, will check DRE at next visit annual      Controlled type 2 diabetes mellitus without complication ([place]) - Primary    Well-controlled at goal, A1c 6.4, from 6.0 but improved from 6.6, still at goal < 7 No complications or hypoglycemia. Last urine microalbumin negative  Plan:  1. Remain diet controlled, encouraged continue improve DM diet and lifestyle, exercise 2. Cont ASA 81mg , statin 3. Defer future consider ACEi/ARB   4. We will contact [place] for record of DM eye from 07/2017 5. Follow-up 6 months Annual phys + labs a1c      Screening for prostate cancer      No orders of the defined types were placed in this encounter.    Follow up plan: Return in about 6 months (around 07/04/2018) for Annual Physical.  Future labs ordered for 07/03/18  Nobie Putnam, [facility] 01/01/2018, 1:33 PM

## 2018-01-16 ENCOUNTER — Other Ambulatory Visit: Payer: Self-pay

## 2018-01-16 ENCOUNTER — Encounter
Admission: EM | Disposition: A | Discharge status: Home / Self Care | Payer: Self-pay | Source: Home / Self Care | Attending: Internal Medicine

## 2018-01-16 ENCOUNTER — Inpatient Hospital Stay (HOSPITAL_COMMUNITY)
Admit: 2018-01-16 | Discharge: 2018-01-16 | Disposition: A | Discharge status: Home / Self Care | Payer: PPO | Attending: Internal Medicine | Admitting: Internal Medicine

## 2018-01-16 ENCOUNTER — Ambulatory Visit: Payer: PPO | Admitting: Family Medicine

## 2018-01-16 ENCOUNTER — Inpatient Hospital Stay
Admission: EM | Admit: 2018-01-16 | Discharge: 2018-01-18 | DRG: 247 | Disposition: A | Discharge status: Home / Self Care | Payer: PPO | Attending: Internal Medicine | Admitting: Internal Medicine

## 2018-01-16 ENCOUNTER — Telehealth: Payer: Self-pay

## 2018-01-16 DIAGNOSIS — I10 Essential (primary) hypertension: Secondary | ICD-10-CM

## 2018-01-16 DIAGNOSIS — I480 Paroxysmal atrial fibrillation: Secondary | ICD-10-CM | POA: Diagnosis not present

## 2018-01-16 DIAGNOSIS — I2111 ST elevation (STEMI) myocardial infarction involving right coronary artery: Secondary | ICD-10-CM | POA: Diagnosis not present

## 2018-01-16 DIAGNOSIS — Z7982 Long term (current) use of aspirin: Secondary | ICD-10-CM | POA: Diagnosis not present

## 2018-01-16 DIAGNOSIS — R079 Chest pain, unspecified: Secondary | ICD-10-CM | POA: Diagnosis not present

## 2018-01-16 DIAGNOSIS — I9589 Other hypotension: Secondary | ICD-10-CM | POA: Diagnosis not present

## 2018-01-16 DIAGNOSIS — I255 Ischemic cardiomyopathy: Secondary | ICD-10-CM | POA: Diagnosis present

## 2018-01-16 DIAGNOSIS — Z833 Family history of diabetes mellitus: Secondary | ICD-10-CM | POA: Diagnosis not present

## 2018-01-16 DIAGNOSIS — R072 Precordial pain: Secondary | ICD-10-CM | POA: Diagnosis not present

## 2018-01-16 DIAGNOSIS — Z8349 Family history of other endocrine, nutritional and metabolic diseases: Secondary | ICD-10-CM

## 2018-01-16 DIAGNOSIS — E1159 Type 2 diabetes mellitus with other circulatory complications: Secondary | ICD-10-CM

## 2018-01-16 DIAGNOSIS — R7301 Impaired fasting glucose: Secondary | ICD-10-CM | POA: Diagnosis not present

## 2018-01-16 DIAGNOSIS — I2510 Atherosclerotic heart disease of native coronary artery without angina pectoris: Secondary | ICD-10-CM | POA: Diagnosis present

## 2018-01-16 DIAGNOSIS — I2119 ST elevation (STEMI) myocardial infarction involving other coronary artery of inferior wall: Secondary | ICD-10-CM | POA: Diagnosis not present

## 2018-01-16 DIAGNOSIS — I9788 Other intraoperative complications of the circulatory system, not elsewhere classified: Secondary | ICD-10-CM | POA: Diagnosis not present

## 2018-01-16 DIAGNOSIS — Z87891 Personal history of nicotine dependence: Secondary | ICD-10-CM | POA: Diagnosis not present

## 2018-01-16 DIAGNOSIS — I213 ST elevation (STEMI) myocardial infarction of unspecified site: Secondary | ICD-10-CM

## 2018-01-16 DIAGNOSIS — Z79899 Other long term (current) drug therapy: Secondary | ICD-10-CM

## 2018-01-16 DIAGNOSIS — Z8042 Family history of malignant neoplasm of prostate: Secondary | ICD-10-CM | POA: Diagnosis not present

## 2018-01-16 DIAGNOSIS — E785 Hyperlipidemia, unspecified: Secondary | ICD-10-CM | POA: Diagnosis present

## 2018-01-16 DIAGNOSIS — N400 Enlarged prostate without lower urinary tract symptoms: Secondary | ICD-10-CM | POA: Diagnosis not present

## 2018-01-16 DIAGNOSIS — K219 Gastro-esophageal reflux disease without esophagitis: Secondary | ICD-10-CM | POA: Diagnosis present

## 2018-01-16 DIAGNOSIS — R739 Hyperglycemia, unspecified: Secondary | ICD-10-CM | POA: Diagnosis not present

## 2018-01-16 DIAGNOSIS — I252 Old myocardial infarction: Secondary | ICD-10-CM | POA: Diagnosis present

## 2018-01-16 DIAGNOSIS — Z9861 Coronary angioplasty status: Secondary | ICD-10-CM | POA: Diagnosis not present

## 2018-01-16 DIAGNOSIS — J069 Acute upper respiratory infection, unspecified: Secondary | ICD-10-CM

## 2018-01-16 HISTORY — PX: LEFT HEART CATH AND CORONARY ANGIOGRAPHY: CATH118249

## 2018-01-16 HISTORY — PX: CORONARY/GRAFT ACUTE MI REVASCULARIZATION: CATH118305

## 2018-01-16 LAB — CBC WITH DIFFERENTIAL/PLATELET
BASOS PCT: 0 %
Basophils Absolute: 0 10*3/uL (ref 0–0.1)
EOS ABS: 0.1 10*3/uL (ref 0–0.7)
Eosinophils Relative: 1 %
HEMATOCRIT: 43.4 % (ref 40.0–52.0)
Hemoglobin: 14.6 g/dL (ref 13.0–18.0)
Lymphocytes Relative: 18 %
Lymphs Abs: 2.4 10*3/uL (ref 1.0–3.6)
MCH: 30.3 pg (ref 26.0–34.0)
MCHC: 33.7 g/dL (ref 32.0–36.0)
MCV: 89.9 fL (ref 80.0–100.0)
MONO ABS: 0.9 10*3/uL (ref 0.2–1.0)
Monocytes Relative: 7 %
Neutro Abs: 9.8 10*3/uL — ABNORMAL HIGH (ref 1.4–6.5)
Neutrophils Relative %: 74 %
Platelets: 271 10*3/uL (ref 150–440)
RBC: 4.83 MIL/uL (ref 4.40–5.90)
RDW: 13 % (ref 11.5–14.5)
WBC: 13.3 10*3/uL — ABNORMAL HIGH (ref 3.8–10.6)

## 2018-01-16 LAB — PROTIME-INR
INR: 1.04
Prothrombin Time: 13.5 seconds (ref 11.4–15.2)

## 2018-01-16 LAB — GLUCOSE, CAPILLARY
GLUCOSE-CAPILLARY: 186 mg/dL — AB (ref 65–99)
GLUCOSE-CAPILLARY: 182 mg/dL — AB (ref 65–99)
Glucose-Capillary: 233 mg/dL — ABNORMAL HIGH (ref 65–99)

## 2018-01-16 LAB — COMPREHENSIVE METABOLIC PANEL
ALK PHOS: 83 U/L (ref 38–126)
ALT: 29 U/L (ref 17–63)
AST: 60 U/L — AB (ref 15–41)
Albumin: 4.3 g/dL (ref 3.5–5.0)
Anion gap: 11 (ref 5–15)
BILIRUBIN TOTAL: 0.8 mg/dL (ref 0.3–1.2)
BUN: 19 mg/dL (ref 6–20)
CALCIUM: 9.2 mg/dL (ref 8.9–10.3)
CO2: 23 mmol/L (ref 22–32)
CREATININE: 1.2 mg/dL (ref 0.61–1.24)
Chloride: 103 mmol/L (ref 101–111)
GFR calc Af Amer: >60 mL/min (ref >60)
GFR, EST NON AFRICAN AMERICAN: 59 mL/min — AB (ref >60)
Glucose, Bld: 320 mg/dL — ABNORMAL HIGH (ref 65–99)
POTASSIUM: 4.1 mmol/L (ref 3.5–5.1)
Sodium: 137 mmol/L (ref 135–145)
TOTAL PROTEIN: 6.9 g/dL (ref 6.5–8.1)

## 2018-01-16 LAB — LIPID PANEL
Cholesterol: 142 mg/dL (ref 0–200)
HDL: 39 mg/dL — AB (ref >40)
LDL CALC: 77 mg/dL (ref 0–99)
Total CHOL/HDL Ratio: 3.6 RATIO
Triglycerides: 129 mg/dL (ref <150)
VLDL: 26 mg/dL (ref 0–40)

## 2018-01-16 LAB — POCT I-STAT, CHEM 8
BUN: 17 mg/dL (ref 6–20)
CHLORIDE: 101 mmol/L (ref 101–111)
Calcium, Ion: 1.15 mmol/L (ref 1.15–1.40)
Creatinine, Ser: 0.9 mg/dL (ref 0.61–1.24)
Glucose, Bld: 326 mg/dL — ABNORMAL HIGH (ref 65–99)
HEMATOCRIT: 36 % — AB (ref 39.0–52.0)
Hemoglobin: 12.2 g/dL — ABNORMAL LOW (ref 13.0–17.0)
POTASSIUM: 3.9 mmol/L (ref 3.5–5.1)
SODIUM: 136 mmol/L (ref 135–145)
TCO2: 19 mmol/L — ABNORMAL LOW (ref 22–32)

## 2018-01-16 LAB — APTT: aPTT: 24 seconds (ref 24–36)

## 2018-01-16 LAB — TROPONIN I
TROPONIN I: 1.78 ng/mL — AB (ref <0.03)
Troponin I: 48.06 ng/mL (ref <0.03)

## 2018-01-16 LAB — MRSA PCR SCREENING: MRSA by PCR: NEGATIVE

## 2018-01-16 LAB — POCT ACTIVATED CLOTTING TIME: ACTIVATED CLOTTING TIME: 241 s

## 2018-01-16 SURGERY — CORONARY/GRAFT ACUTE MI REVASCULARIZATION
Anesthesia: Moderate Sedation

## 2018-01-16 MED ORDER — HEPARIN SODIUM (PORCINE) 5000 UNIT/ML IJ SOLN: 4000.0000 [IU] | Freq: Once | INTRAMUSCULAR | Status: DC

## 2018-01-16 MED ORDER — SODIUM CHLORIDE 0.9% FLUSH: 3.0000 mL | INTRAVENOUS | Status: DC | PRN

## 2018-01-16 MED ORDER — FENTANYL CITRATE (PF) 100 MCG/2ML IJ SOLN
INTRAMUSCULAR | Status: AC
  Filled 2018-01-16: qty 2

## 2018-01-16 MED ORDER — SODIUM CHLORIDE 0.9 % IV SOLN: INTRAVENOUS | Status: DC

## 2018-01-16 MED ORDER — TIROFIBAN HCL IN NACL 5-0.9 MG/100ML-% IV SOLN
0.1500 ug/kg/min | INTRAVENOUS | Status: AC
  Filled 2018-01-16: qty 100

## 2018-01-16 MED ORDER — ADENOSINE (DIAGNOSTIC) FOR INTRACORONARY USE
INTRAVENOUS | Status: DC | PRN
  Administered 2018-01-16 (×3): 24 ug via INTRACORONARY

## 2018-01-16 MED ORDER — ATORVASTATIN CALCIUM 20 MG PO TABS
80.0000 mg | ORAL_TABLET | Freq: Every day | ORAL | Status: DC
  Administered 2018-01-16 – 2018-01-17 (×2): 80 mg via ORAL
  Filled 2018-01-16 (×2): qty 4

## 2018-01-16 MED ORDER — TICAGRELOR 90 MG PO TABS
180.0000 mg | ORAL_TABLET | Freq: Once | ORAL | Status: AC
  Administered 2018-01-16: 180 mg via ORAL
  Filled 2018-01-16: qty 2

## 2018-01-16 MED ORDER — ASPIRIN 81 MG PO CHEW: 324.0000 mg | CHEWABLE_TABLET | Freq: Once | ORAL | Status: DC

## 2018-01-16 MED ORDER — HYDRALAZINE HCL 20 MG/ML IJ SOLN: 5.0000 mg | INTRAMUSCULAR | Status: AC | PRN

## 2018-01-16 MED ORDER — TIROFIBAN HCL IN NACL 5-0.9 MG/100ML-% IV SOLN
INTRAVENOUS | Status: AC
Start: 2018-01-16 — End: 2018-01-16
  Filled 2018-01-16: qty 100

## 2018-01-16 MED ORDER — LABETALOL HCL 5 MG/ML IV SOLN: 10.0000 mg | INTRAVENOUS | Status: AC | PRN

## 2018-01-16 MED ORDER — SODIUM CHLORIDE 0.9% FLUSH
3.0000 mL | Freq: Two times a day (BID) | INTRAVENOUS | Status: DC
  Administered 2018-01-17 – 2018-01-18 (×3): 3 mL via INTRAVENOUS

## 2018-01-16 MED ORDER — ENOXAPARIN SODIUM 40 MG/0.4ML ~~LOC~~ SOLN
40.0000 mg | SUBCUTANEOUS | Status: DC
  Administered 2018-01-17: 40 mg via SUBCUTANEOUS
  Filled 2018-01-16: qty 0.4

## 2018-01-16 MED ORDER — VERAPAMIL HCL 2.5 MG/ML IV SOLN
INTRAVENOUS | Status: AC
  Filled 2018-01-16: qty 2

## 2018-01-16 MED ORDER — FENTANYL CITRATE (PF) 100 MCG/2ML IJ SOLN
INTRAMUSCULAR | Status: DC | PRN
  Administered 2018-01-16: 25 ug via INTRAVENOUS

## 2018-01-16 MED ORDER — HEPARIN (PORCINE) IN NACL 2-0.9 UNIT/ML-% IJ SOLN
INTRAMUSCULAR | Status: AC
  Filled 2018-01-16: qty 500

## 2018-01-16 MED ORDER — NITROGLYCERIN 5 MG/ML IV SOLN
INTRAVENOUS | Status: AC
  Filled 2018-01-16: qty 10

## 2018-01-16 MED ORDER — SODIUM CHLORIDE 0.9 % IV SOLN
INTRAVENOUS | Status: AC
  Administered 2018-01-16: 17:00:00 via INTRAVENOUS

## 2018-01-16 MED ORDER — HEPARIN SODIUM (PORCINE) 1000 UNIT/ML IJ SOLN
INTRAMUSCULAR | Status: AC
  Filled 2018-01-16: qty 1

## 2018-01-16 MED ORDER — TICAGRELOR 90 MG PO TABS
90.0000 mg | ORAL_TABLET | Freq: Two times a day (BID) | ORAL | Status: DC
  Filled 2018-01-16: qty 1

## 2018-01-16 MED ORDER — SODIUM CHLORIDE 0.9 % IV SOLN: 250.0000 mL | INTRAVENOUS | Status: DC | PRN

## 2018-01-16 MED ORDER — TICAGRELOR 90 MG PO TABS
90.0000 mg | ORAL_TABLET | Freq: Two times a day (BID) | ORAL | Status: DC
Start: 2018-01-17 — End: 2018-01-18
  Administered 2018-01-17 – 2018-01-18 (×3): 90 mg via ORAL
  Filled 2018-01-16 (×4): qty 1

## 2018-01-16 MED ORDER — ASPIRIN EC 81 MG PO TBEC
81.0000 mg | DELAYED_RELEASE_TABLET | Freq: Every day | ORAL | Status: DC
  Administered 2018-01-17 – 2018-01-18 (×2): 81 mg via ORAL
  Filled 2018-01-16 (×3): qty 1

## 2018-01-16 MED ORDER — TIROFIBAN (AGGRASTAT) BOLUS VIA INFUSION
INTRAVENOUS | Status: DC | PRN
  Administered 2018-01-16: 2120 ug via INTRAVENOUS

## 2018-01-16 MED ORDER — INSULIN ASPART 100 UNIT/ML ~~LOC~~ SOLN
0.0000 [IU] | Freq: Three times a day (TID) | SUBCUTANEOUS | Status: DC
  Administered 2018-01-16: 3 [IU] via SUBCUTANEOUS
  Administered 2018-01-17 (×2): 2 [IU] via SUBCUTANEOUS
  Administered 2018-01-17: 3 [IU] via SUBCUTANEOUS
  Administered 2018-01-18: 2 [IU] via SUBCUTANEOUS
  Filled 2018-01-16 (×5): qty 1

## 2018-01-16 MED ORDER — HEPARIN SODIUM (PORCINE) 1000 UNIT/ML IJ SOLN
INTRAMUSCULAR | Status: DC | PRN
  Administered 2018-01-16: 2000 [IU] via INTRAVENOUS
  Administered 2018-01-16: 8000 [IU] via INTRAVENOUS

## 2018-01-16 MED ORDER — INSULIN ASPART 100 UNIT/ML ~~LOC~~ SOLN: 0.0000 [IU] | Freq: Every day | SUBCUTANEOUS | Status: DC

## 2018-01-16 MED ORDER — IOPAMIDOL (ISOVUE-300) INJECTION 61%
INTRAVENOUS | Status: DC | PRN
  Administered 2018-01-16: 130 mL via INTRA_ARTERIAL

## 2018-01-16 MED ORDER — ONDANSETRON HCL 4 MG/2ML IJ SOLN
INTRAMUSCULAR | Status: DC | PRN
  Administered 2018-01-16: 4 mg via INTRAVENOUS

## 2018-01-16 MED ORDER — TIROFIBAN HCL IN NACL 5-0.9 MG/100ML-% IV SOLN
INTRAVENOUS | Status: AC | PRN
  Administered 2018-01-16: 0.15 ug/kg/min via INTRAVENOUS

## 2018-01-16 MED ORDER — MORPHINE SULFATE (PF) 2 MG/ML IV SOLN: 2.0000 mg | INTRAVENOUS | Status: DC | PRN

## 2018-01-16 MED ORDER — ONDANSETRON HCL 4 MG/2ML IJ SOLN: 4.0000 mg | Freq: Four times a day (QID) | INTRAMUSCULAR | Status: DC | PRN

## 2018-01-16 MED ORDER — ADENOSINE 6 MG/2ML IV SOLN
INTRAVENOUS | Status: AC
  Filled 2018-01-16: qty 2

## 2018-01-16 MED ORDER — ONDANSETRON HCL 4 MG/2ML IJ SOLN
INTRAMUSCULAR | Status: AC
  Filled 2018-01-16: qty 2

## 2018-01-16 MED ORDER — TICAGRELOR 90 MG PO TABS
ORAL_TABLET | ORAL | Status: AC
  Filled 2018-01-16: qty 2

## 2018-01-16 MED ORDER — ACETAMINOPHEN 325 MG PO TABS
650.0000 mg | ORAL_TABLET | ORAL | Status: DC | PRN
  Administered 2018-01-16: 650 mg via ORAL
  Filled 2018-01-16: qty 2

## 2018-01-16 SURGICAL SUPPLY — 15 items
BALLN TREK RX 2.25X12 (BALLOONS) ×3
BALLN ~~LOC~~ TREK RX 3.5X20 (BALLOONS) ×3
BALLOON TREK RX 2.25X12 (BALLOONS) ×1 IMPLANT
BALLOON ~~LOC~~ TREK RX 3.5X20 (BALLOONS) ×1 IMPLANT
CATH INFINITI 5 FR JL3.5 (CATHETERS) ×3 IMPLANT
CATH INFINITI 5FR ANG PIGTAIL (CATHETERS) ×3 IMPLANT
CATH VISTA GUIDE 6FR JR4 (CATHETERS) ×3 IMPLANT
DEVICE INFLAT 30 PLUS (MISCELLANEOUS) ×3 IMPLANT
DEVICE RAD TR BAND REGULAR (VASCULAR PRODUCTS) ×6 IMPLANT
GLIDESHEATH SLEND SS 6F .021 (SHEATH) ×3 IMPLANT
KIT MANI 3VAL PERCEP (MISCELLANEOUS) ×3 IMPLANT
PACK CARDIAC CATH (CUSTOM PROCEDURE TRAY) ×3 IMPLANT
STENT SIERRA 3.00 X 38 MM (Permanent Stent) ×3 IMPLANT
WIRE ROSEN-J .035X260CM (WIRE) ×3 IMPLANT
WIRE RUNTHROUGH .014X180CM (WIRE) ×3 IMPLANT

## 2018-01-16 NOTE — ED Notes (Signed)
Per cardiologist, do not do repeat EKG in ED. Do not move off EMS stretcher. Do not place ED defib pads on pt, EMS defib pads already on pt d/t "HR fluctuating" per EMS. Vitals not taken in ED. EMS did not report vitals to this RN, they informed MD's of vitals in route.   Family taken to cath lab waiting.

## 2018-01-16 NOTE — Telephone Encounter (Signed)
Reviewed triage note, and I called patient back to review this plan with him, and expressed my concern that he may need more immediate medical attention before his upcoming apt at 2:00pm today. I spoke with him around 1215pm. He reports that he is still having persistent chest tightness and pressure on his chest, also with the recent heartburn GERD symptoms, yesterday he had more nausea and indigestion. Has already tried H2 blocker zantac without relief.  Chart review shows prior established with [facility] back in 2017, he declined further cardiac testing at that time, he has no known CAD or prior MI. He has several risk factors with DM2, HLD, HTN.  I advised him that this is difficult to triage over the phone, he has some atypical or anginal equivalent symptoms that are new and concerning, and do not seem to improve with his treatment of GERD at home. I advised that safest option is to go to [facility] ED to have it evaluated as a chest pain work-up, and then if it is a false alarm and suspected to be his GERD then we can follow-up on this issue in office later this week or next week. He understand my concerns, and agreed to go to ED today, instead of keeping his 2pm apt at our office.  Ultimately, he may benefit from returning to [facility] Dr Rockey Situ to have further baseline cardiac work-up in near future.  Called [facility] ED Nurse Triage, and attempted to notify them about patient's pending arrival, but I could not get through to leave message.  Nobie Putnam, [facility] 01/16/2018, 12:29 PM

## 2018-01-16 NOTE — Progress Notes (Signed)
MEDICATION RELATED CONSULT NOTE - INITIAL   Pharmacy Consult for  Tirofiban  Indication: post PCI   No Known Allergies  Patient Measurements: Height: 5\' 9"  (175.3 cm) Weight: 186 lb 1.1 oz (84.4 kg) IBW/kg (Calculated) : 70.7 Adjusted Body Weight:    Vital Signs: Temp: 98.2 F (36.8 C) (04/02 1533) Temp Source: Oral (04/02 1533) BP: 136/80 (04/02 1533) Pulse Rate: 70 (04/02 1533) Intake/Output from previous day: No intake/output data recorded. Intake/Output from this shift: No intake/output data recorded.  Labs: Recent Labs    01/16/18 1318 01/16/18 1350  WBC 13.3*  --   HGB 14.6 12.2*  HCT 43.4 36.0*  PLT 271  --   APTT 24  --   CREATININE 1.20 0.90  ALBUMIN 4.3  --   PROT 6.9  --   AST 60*  --   ALT 29  --   ALKPHOS 83  --   BILITOT 0.8  --    Estimated Creatinine Clearance: 75.3 mL/min (by C-G formula based on SCr of 0.9 mg/dL).   Microbiology: No results found for this or any previous visit (from the past 720 hour(s)).  Medical History: Past Medical History:  Diagnosis Date  . Arthritis   . BPH (benign prostatic hypertrophy)   . GERD (gastroesophageal reflux disease)   . Hyperlipidemia   . Hypertension     Medications:  Medications Prior to Admission  Medication Sig Dispense Refill Last Dose  . aspirin 81 MG tablet Take 81 mg by mouth 2 (two) times daily.   Taking  . carvedilol (COREG) 12.5 MG tablet Take 1 tablet (12.5 mg total) by mouth 2 (two) times daily with a meal. 180 tablet 3 Taking  . loratadine (CLARITIN) 10 MG tablet Take 1 tablet (10 mg total) by mouth daily. (Patient taking differently: Take 10 mg by mouth as needed. ) 30 tablet 11 PRN at PRN  . simvastatin (ZOCOR) 20 MG tablet Take 1 tablet (20 mg total) by mouth daily. 90 tablet 3 Taking  . tamsulosin (FLOMAX) 0.4 MG CAPS capsule Take 1 capsule (0.4 mg total) by mouth daily. 30 capsule 5   . Multiple Vitamin (MULTIVITAMIN WITH MINERALS) TABS tablet Take 1 tablet by mouth every other  day.   Taking  . ranitidine (ZANTAC) 150 MG capsule Take 150 mg by mouth daily as needed.    Taking    Assessment: Pharmacy consulted to dose tirofiban in this 72 year male S/P PCI.  PCI was completed on 4/2 @ ~ 15:00.  TBW = 84.4 kg  CrCl = 75.3 ml/min   Goal of Therapy:    Plan:  Will continue tirofiban @ 0.15 mcg/kg/min until 20:00 per MD's instruction.   Barry [name] D 01/16/2018,4:23 PM

## 2018-01-16 NOTE — Telephone Encounter (Signed)
Patient called the office felt some indigestion today had taken Zantac twice today ( Morning and at 11:00 am), had nausea yesterday and has some SOB after shower today. Pt had some chest tightness but not chest pain advised if he gets worst or need more attention call ER other wise keep appointment as scheduled.

## 2018-01-16 NOTE — ED Triage Notes (Signed)
Pt arrives [facility] emergency traffic from home. Central CP that began at 6am this morning. No hx of stents or M. Alert, oriented. Took 81mg  asa this morning, EMS gave the rest of ASA to equal 324.   CBG 309. STEMI called in field.   ED MD and cardiologist at bedside.   Pt taken to cath lab with cardiologist and Otila Kluver RN

## 2018-01-16 NOTE — Progress Notes (Signed)
Pt arrived to ICU from cath lab. Pt tolerated TR band removal and receiving Aggrastat infusion. Pt on RA at this time and reporting no pain at this time. Pt in NSR with no ectopy. Family at bedside. Will continue to monitor.

## 2018-01-16 NOTE — Brief Op Note (Signed)
BRIEF CARDIAC CATHETERIZATION NOTE  DATE: 01/16/2018 TIME: 2:40 PM  PATIENT:  Barry [name]  72 y.o. male  PRE-OPERATIVE DIAGNOSIS:  STEMI  POST-OPERATIVE DIAGNOSIS:  STEMI  PROCEDURE:  Procedure(s): Coronary/Graft Acute MI Revascularization (N/A) LEFT HEART CATH AND CORONARY ANGIOGRAPHY (N/A)  SURGEON:  Surgeon(s) and Role:    Nelva Bush, MD - Primary  FINDINGS: 1. Inferior STEMI with thrombotic occlusion of proximal RCA. 2. Mild to moderate, non-obstructive CAD involving LCA. 3. Mildly elevated LVEDP. 4. Mildly reduced LVEF with basal and mid inferior hypokinesis. 5. Successful primary PCI to proximal/mid RCA using Xience Sierra 3.0 x 38 mm DES (post-dilated to 3.5 mm) with 0% residual stenosis and TIMI-3 flow. 6. Atrial fibrillation with slow ventricular response at start of case, which spontaneously converted to NSR following reestablishment of flow in the RCA.  RECOMMENDATIONS: 1. Admit to CCU for post-STEMI care. 2. DAPT with ASA and ticagrelor for at least 12 months. 3. Aggressive secondary prevention, including high-intensity statin therapy. 4. Obtain TTE.  Nelva Bush, MD [facility] Pager: [phone]

## 2018-01-16 NOTE — Progress Notes (Signed)
*  PRELIMINARY RESULTS* Echocardiogram 2D Echocardiogram has been performed.  Barry [name] Barry [name] 01/16/2018, 7:02 PM

## 2018-01-16 NOTE — Consult Note (Signed)
Cardiology Consultation:   Patient ID: Barry [name]; [id number]; 1946-05-09   Admit date: 01/16/2018 Date of Consult: 01/16/2018  Primary Care Provider: Olin Hauser, DO Primary Cardiologist: Barry Plants, MD PhD Primary Electrophysiologist:  None   Patient Profile:   Barry [name] is a 72 y.o. male with a hx of HTN, HLD, and diet-controlled DM2 who is being seen today for the evaluation of chest pain and abnormal EKG at the request of Dr. Kerman [name].  History of Present Illness:   [name] was in his usual state of health until approximal 6 AM, at which time he developed left sided chest pain/pressure without radiation. It was accompanied by nausea and belching. He initially planned to see his PCP this afternoon for further evaluation but was instructed to go to the ED. EMS was summoned and found [name] to be bradycardia with inferior ST elevation on EKG. STEMI team was activated. Upon arrival in the ED, [name] continued to have moderate left-sided chest pain. He was therefore taken for emergent cardiac catheterization, which revealed thrombotic occlusion of the proximal RCA. This was treated with a single drug-eluting stent with resolution of chest pain. He was also noted to be in a-fib with slow ventricular response prior to arriving in the cath lab. Following revascularization of the RCA, he spontaneously converted to normal sinus rhythm.  Past Medical History:  Diagnosis Date  . Arthritis   . BPH (benign prostatic hypertrophy)   . GERD (gastroesophageal reflux disease)   . Hyperlipidemia   . Hypertension     Past Surgical History:  Procedure Laterality Date  . CATARACT EXTRACTION W/PHACO Right 04/12/2016   Procedure: CATARACT EXTRACTION PHACO AND INTRAOCULAR LENS PLACEMENT (IOC);  Surgeon: Barry Robson, MD;  Location: [facility] ORS;  Service: Ophthalmology;  Laterality: Right;  [place]' 01:05 AP% 26.6 CDE 17.48 fluid pack lot # [id number]  . KNEE SURGERY Right  09/21/2012     Home Medications:  Prior to Admission medications   Medication Sig Start Date Barry [name] Date Taking? Authorizing Provider  aspirin 81 MG tablet Take 81 mg by mouth 2 (two) times daily.   Yes [provider]  carvedilol (COREG) 12.5 MG tablet Take 1 tablet (12.5 mg total) by mouth 2 (two) times daily with a meal. 07/04/17  Yes Barry [name], Barry Doughty, DO  loratadine (CLARITIN) 10 MG tablet Take 1 tablet (10 mg total) by mouth daily. Patient taking differently: Take 10 mg by mouth as needed.  11/14/16  Yes Barry [name]., MD  simvastatin (ZOCOR) 20 MG tablet Take 1 tablet (20 mg total) by mouth daily. 07/04/17  Yes Barry [name], Barry Doughty, DO  tamsulosin (FLOMAX) 0.4 MG CAPS capsule Take 1 capsule (0.4 mg total) by mouth daily. 01/01/18  Yes Barry [name], Barry Doughty, DO  Multiple Vitamin (MULTIVITAMIN WITH MINERALS) TABS tablet Take 1 tablet by mouth every other day.    [provider]  ranitidine (ZANTAC) 150 MG capsule Take 150 mg by mouth daily as needed.  02/24/15   [provider]    Inpatient Medications: Scheduled Meds: . [MAR Hold] aspirin  324 mg Oral Once  . [MAR Hold] heparin  60 Units/kg Intravenous Once   Continuous Infusions: . sodium chloride    . tirofiban 0.15 mcg/kg/min (01/16/18 1351)   PRN Meds: adenosine (diagnostic), fentaNYL, heparin, iopamidol, tirofiban, tirofiban  Allergies:   No Known Allergies  Social History:   Social History   Socioeconomic History  . Marital status: Married    Spouse  name: Not on file  . Number of children: Not on file  . Years of education: Not on file  . Highest education level: Not on file  Occupational History  . Not on file  Social Needs  . Financial resource strain: Not on file  . Food insecurity:    Worry: Not on file    Inability: Not on file  . Transportation needs:    Medical: Not on file    Non-medical: Not on file  Tobacco Use  . Smoking status: Never Smoker  . Smokeless  tobacco: Former Systems developer    Types: Chew  Substance and Sexual Activity  . Alcohol use: No  . Drug use: No  . Sexual activity: Not on file  Lifestyle  . Physical activity:    Days per week: Not on file    Minutes per session: Not on file  . Stress: Not on file  Relationships  . Social connections:    Talks on phone: Not on file    Gets together: Not on file    Attends religious service: Not on file    Active member of club or organization: Not on file    Attends meetings of clubs or organizations: Not on file    Relationship status: Not on file  . Intimate partner violence:    Fear of current or ex partner: Not on file    Emotionally abused: Not on file    Physically abused: Not on file    Forced sexual activity: Not on file  Other Topics Concern  . Not on file  Social History Narrative  . Not on file    Family History:    Family History  Problem Relation Age of Onset  . Diabetes Mother   . Stroke Mother   . Prostate cancer Brother   . Melanoma Brother   . Hyperlipidemia Paternal Aunt      ROS:  Review of Systems  Unable to perform ROS: Acuity of condition    Physical Exam/Data:   Vitals:   01/16/18 1321 01/16/18 1334  SpO2:  97%  Weight: 187 lb (84.8 kg)   Height: 5\' 9"  (1.753 m)    No intake or output data in the 24 hours ending 01/16/18 1445 Filed Weights   01/16/18 1321  Weight: 187 lb (84.8 kg)   Body mass index is 27.62 kg/m.  General:  Well nourished, well developed, lying on stretcher. HEENT: normal Lymph: no adenopathy Neck: no JVD Endocrine:  No thryomegaly Vascular: No carotid bruits; 1+ radial and femoral pulses bilaterally Cardiac:  Bradycardic and irregular without murmurs. Lungs:  clear to auscultation bilaterally, no wheezing, rhonchi or rales  Abd: soft, nontender, no hepatomegaly  Ext: no edema Musculoskeletal:  No deformities, BUE and BLE strength normal and equal Skin: warm and dry  Neuro:  CNs 2-12 intact, no focal abnormalities  noted Psych:  Normal affect   EKG:  The EKG was personally reviewed and demonstrates:  Atrial fibrillation with slow ventricular response and inferior ST elevation with ST depressions in V1 and V2.  Relevant CV Studies: LHC/PCI (01/16/18): 1. Inferior STEMI with thrombotic occlusion of proximal RCA. 2. Mild to moderate, non-obstructive CAD involving LCA. 3. Mildly elevated LVEDP. 4. Mildly reduced LVEF with basal and mid inferior hypokinesis. 5. Successful primary PCI to proximal/mid RCA using Xience Sierra 3.0 x 38 mm DES (post-dilated to 3.5 mm) with 0% residual stenosis and TIMI-3 flow. 6. Atrial fibrillation with slow ventricular response at start of case, which  spontaneously converted to NSR following reestablishment of flow in the RCA.  Laboratory Data:  Chemistry Recent Labs  Lab 01/16/18 1318 01/16/18 1350  NA 137 136  K 4.1 3.9  CL 103 101  CO2 23  --   GLUCOSE 320* 326*  BUN 19 17  CREATININE 1.20 0.90  CALCIUM 9.2  --   GFRNONAA 59*  --   GFRAA >60  --   ANIONGAP 11  --     Recent Labs  Lab 01/16/18 1318  PROT 6.9  ALBUMIN 4.3  AST 60*  ALT 29  ALKPHOS 83  BILITOT 0.8   Hematology Recent Labs  Lab 01/16/18 1318 01/16/18 1350  WBC 13.3*  --   RBC 4.83  --   HGB 14.6 12.2*  HCT 43.4 36.0*  MCV 89.9  --   MCH 30.3  --   MCHC 33.7  --   RDW 13.0  --   PLT 271  --    Cardiac Enzymes Recent Labs  Lab 01/16/18 1318  TROPONINI 1.78*   No results for input(s): TROPIPOC in the last 168 hours.  BNPNo results for input(s): BNP, PROBNP in the last 168 hours.  DDimer No results for input(s): DDIMER in the last 168 hours.  Radiology/Studies:  No results found.  Assessment and Plan:   Inferior STEMI Mr. [name] presented with ~7 hours of chest pain and was found to have inferior STEMI due to acute plaque rupture with thrombotic occlusion of the proximal RCA (type 1 MI). He underwent primary PCI with resolution of symptoms.  Continue tirofiban x 6  hours.  Dual antiplatelet therapy with aspirin and ticagrelor x 12 months or longer.  Trend troponin until it has peaked, then stop.  Obtain transthoracic echocardiogram.  Aggressive secondary prevention.  Ischemic cardiomyopathy LVgram shows basal and mid inferior hypokinesis with mildly reduced LVEF and mildly elevated LVEDP.  Gentle post-catheterization. If patient develops shortness of breath, hold IVF and diurese gently.  Obtain TTE.  Hold carvedilol given bradycardia and hypotension during procedure. Add back gradually, as HR and BP tolerate.  Atrial fibrillation Noted in the setting of inferior STEMI. Pt converted to NSR spontaneously following revascularization of RCA.  Monitor on telemetry.  If recurrence of atrial fibrillation, Mr. Shorb will need to be anticoagulated given CHADSVASC score of at least 4. In this case, I would switch ticagrelor to clopidogrel and continue apixaban, clopidogrel, and ASA x 1 month, followed by long-term apixaban and clopidogrel.  Hypertension BP low during case but low normal at Mounir Skipper of the procedure.  Hold carvedilol for now. Restart tonight or tomorrow as HR and BP allow.  Hyperlipidemia LDL above goal today at 77  Switch simvastatin to atorvastatin 80 mg daily  DM2  Sliding scale insulin.  Further management per hospitalist service.  Disposition  Admit to CCU x 24; if stable, anticipate transfer to telemetry tomorrow.  For questions or updates, please contact [place] Please consult [email or web address] for contact info under [facility].   Signed, Nelva Bush, MD  01/16/2018 2:45 PM

## 2018-01-16 NOTE — ED Provider Notes (Signed)
[facility] Emergency Department Provider Note  Time seen: 1:20 PM  I have reviewed the triage vital signs and the nursing notes.   HISTORY  Chief Complaint Code STEMI    HPI Barry [name] is a 72 y.o. male with a past medical history of hypertension, hyperlipidemia presents to the emergency department for chest pain, EKG concerning for STEMI brought emergency traffic.  According to the patient he developed chest pain proximally 1 hour ago in his left mid chest, progressively worsened.  Denies any nausea or diaphoresis or shortness of breath.  Denies any prior heart attack.  Describes his pain as a 6/10 dull pain currently.  Denies any radiation of the pain.   Past Medical History:  Diagnosis Date  . Arthritis   . BPH (benign prostatic hypertrophy)   . GERD (gastroesophageal reflux disease)   . Hyperlipidemia   . Hypertension     Patient Active Problem List   Diagnosis Date Noted  . Rosacea 07/04/2017  . Screening for prostate cancer 07/04/2017  . Arthritis 06/06/2016  . Controlled type 2 diabetes mellitus without complication ([place]) 02/58/5277  . Acute prostatitis 04/08/2015  . Cerumen impaction 04/08/2015  . Essential (primary) hypertension 04/08/2015  . Difficulty hearing 04/08/2015  . Gastric reflux 04/08/2015  . Family history of malignant neoplasm of prostate 04/08/2015  . Hemorrhoid 04/08/2015  . Hypercholesteremia 04/08/2015  . Pedal edema 04/08/2015  . Blood in the urine 02/16/2015  . Benign prostatic hyperplasia with lower urinary tract symptoms 02/16/2015  . Intermittent urinary stream 02/16/2015  . Urinary hesitancy 02/16/2015    Past Surgical History:  Procedure Laterality Date  . CATARACT EXTRACTION W/PHACO Right 04/12/2016   Procedure: CATARACT EXTRACTION PHACO AND INTRAOCULAR LENS PLACEMENT (IOC);  Surgeon: Birder Robson, MD;  Location: [facility] ORS;  Service: Ophthalmology;  Laterality: Right;  [place]' 01:05 AP% 26.6 CDE  17.48 fluid pack lot # [id number]  . KNEE SURGERY Right 09/21/2012    Prior to Admission medications   Medication Sig Start Date End Date Taking? Authorizing Provider  carvedilol (COREG) 12.5 MG tablet Take 1 tablet (12.5 mg total) by mouth 2 (two) times daily with a meal. 07/04/17  Yes Karamalegos, Devonne Doughty, DO  loratadine (CLARITIN) 10 MG tablet Take 1 tablet (10 mg total) by mouth daily. Patient taking differently: Take 10 mg by mouth as needed.  11/14/16  Yes Arlis Porta., MD  simvastatin (ZOCOR) 20 MG tablet Take 1 tablet (20 mg total) by mouth daily. 07/04/17  Yes Karamalegos, Devonne Doughty, DO  aspirin 81 MG tablet Take 81 mg by mouth 2 (two) times daily.    [provider]  Multiple Vitamin (MULTIVITAMIN WITH MINERALS) TABS tablet Take 1 tablet by mouth every other day.    [provider]  ranitidine (ZANTAC) 150 MG capsule Take 150 mg by mouth daily as needed.  02/24/15   [provider]  tamsulosin (FLOMAX) 0.4 MG CAPS capsule Take 1 capsule (0.4 mg total) by mouth daily. 01/01/18   Karamalegos, Devonne Doughty, DO    No Known Allergies  Family History  Problem Relation Age of Onset  . Diabetes Mother   . Stroke Mother   . Prostate cancer Brother   . Melanoma Brother   . Hyperlipidemia Paternal Aunt     Social History Social History   Tobacco Use  . Smoking status: Never Smoker  . Smokeless tobacco: Former Systems developer    Types: Chew  Substance Use Topics  . Alcohol use: No  .  Drug use: No    Review of Systems Constitutional: Negative for loss of consciousness. Cardiovascular: 6/10 left-sided chest pain Respiratory: Negative for shortness of breath. Gastrointestinal: Negative for abdominal pain.  Negative for nausea or vomiting. Musculoskeletal: No leg pain or swelling. Skin: Normal in appearance. All other ROS negative  ____________________________________________   PHYSICAL EXAM:  Constitutional: Alert and oriented.  Appears to be  moderately uncomfortable but no distress.  Calm and cooperative. Eyes: Normal exam ENT   Head: Normocephalic and atraumatic.   Mouth/Throat: Mucous membranes are moist. Cardiovascular: Normal rate, regular rhythm.  Respiratory: Normal respiratory effort without tachypnea nor retractions. Breath sounds are clear.  No wheeze rales or rhonchi. Gastrointestinal: Soft and nontender. No distention.   Musculoskeletal: Nontender with normal range of motion in all extremities. No lower extremity tenderness or edema. Neurologic:  Normal speech and language. No gross focal neurologic deficits Skin:  Skin is warm, dry and intact.  Psychiatric: Mood and affect are normal.   ____________________________________________    EKG  EKG by EMS interpreted by myself shows what appears to be sinus rhythm versus atrial fibrillation around 60 bpm.  Narrow QRS, normal axis, largely normal intervals.  Significant ST elevation in leads II, III and aVF with reciprocal depressions in lead I, aVL, V2 and V3.  Consistent with ST segment elevation MI.  ____________________________________________   INITIAL IMPRESSION / ASSESSMENT AND PLAN / ED COURSE  Pertinent labs & imaging results that were available during my care of the patient were reviewed by me and considered in my medical decision making (see chart for details).  Vision presents emergency department for left-sided chest pain beginning approximately 1 hour prior to arrival currently 6/10 dull aching pain.  Differential includes chest pain, MI, STEMI.  EKG consistent with inferior ST elevation MI.  Given the clear EMS EKG, and cardiology already in the emergency department prior to patient arrival decision was made to leave the patient on EMS stretcher.  Patient was registered we obtained lab work and continued to the Cath Lab within 5 or 6 minutes of arrival.  Total time in the emergency department less than 10 minutes.  Discussed the patient with  cardiology we will hold off on Plavix and heparin at this time given the rapid transport to cath lab and anticipated cardiac catheterization.  ____________________________________________   FINAL CLINICAL IMPRESSION(S) / ED DIAGNOSES  Chest pain Inferior STEMI    Harvest Dark, MD 01/16/18 1325

## 2018-01-16 NOTE — H&P (Signed)
[place] [facility] [place] NAME: Barry [name]    MR#:  [id number]  DATE OF BIRTH:  06-Aug-1946  DATE OF ADMISSION:  01/16/2018  PRIMARY CARE PHYSICIAN: Olin Hauser, DO   REQUESTING/REFERRING PHYSICIAN:   CHIEF COMPLAINT:   Chief Complaint  Patient presents with  . Code STEMI    HISTORY OF PRESENT ILLNESS: Barry [name]  is a 72 y.o. male with a known history of hyperlipidemia, hypertension, GERD, benign prostate hypertrophy presented to the emergency room for chest pain and abnormal EKG .code STEMI was called.  Patient was in his usual state of health until approximately 6 AM this morning when he developed left-sided chest pain/pressure without any radiation.  He was initially planning to see his primary care physician this afternoon but he was instructed to go to the emergency room.  He had some nausea when he had this chest pain this morning.  STEMI team was activated.  Patient continued to have moderate left-sided chest pain when he arrived in the emergency room.  Emergent cardiac catheterization was done by cardiologist Dr.End which revealed thrombotic occlusion of the proximal right coronary artery.  This was treated with single drug-eluting stent with resolution of chest pain.  Prior to the presentation to the Cath Lab patient was found to be in atrial fibrillation with slow ventricular rate.  After revascularization of the RCA he spontaneously converted to sinus rhythm.  No complaints of any chest pain, currently patient in the ICU, with close monitoring.  PAST MEDICAL HISTORY:   Past Medical History:  Diagnosis Date  . Arthritis   . BPH (benign prostatic hypertrophy)   . GERD (gastroesophageal reflux disease)   . Hyperlipidemia   . Hypertension     PAST SURGICAL HISTORY:  Past Surgical History:  Procedure Laterality Date  . CATARACT EXTRACTION W/PHACO Right 04/12/2016   Procedure: CATARACT EXTRACTION PHACO AND INTRAOCULAR  LENS PLACEMENT (IOC);  Surgeon: Birder Robson, MD;  Location: [facility] ORS;  Service: Ophthalmology;  Laterality: Right;  [place]' 01:05 AP% 26.6 CDE 17.48 fluid pack lot # [id number]  . KNEE SURGERY Right 09/21/2012    SOCIAL HISTORY:  Social History   Tobacco Use  . Smoking status: Never Smoker  . Smokeless tobacco: Former Systems developer    Types: Chew  Substance Use Topics  . Alcohol use: No    FAMILY HISTORY:  Family History  Problem Relation Age of Onset  . Diabetes Mother   . Stroke Mother   . Prostate cancer Brother   . Melanoma Brother   . Hyperlipidemia Paternal Aunt     DRUG ALLERGIES: No Known Allergies  REVIEW OF SYSTEMS:   CONSTITUTIONAL: No fever, fatigue or weakness.  EYES: No blurred or double vision.  EARS, NOSE, AND THROAT: No tinnitus or ear pain.  RESPIRATORY: No cough, shortness of breath, wheezing or hemoptysis.  CARDIOVASCULAR: Had chest pain,  No orthopnea, edema.  GASTROINTESTINAL: No nausea, vomiting, diarrhea or abdominal pain.  GENITOURINARY: No dysuria, hematuria.  ENDOCRINE: No polyuria, nocturia,  HEMATOLOGY: No anemia, easy bruising or bleeding SKIN: No rash or lesion. MUSCULOSKELETAL: No joint pain or arthritis.   NEUROLOGIC: No tingling, numbness, weakness.  PSYCHIATRY: No anxiety or depression.   MEDICATIONS AT HOME:  Prior to Admission medications   Medication Sig Start Date End Date Taking? Authorizing Provider  aspirin 81 MG tablet Take 81 mg by mouth 2 (two) times daily.   Yes [provider]  carvedilol (COREG) 12.5 MG tablet  Take 1 tablet (12.5 mg total) by mouth 2 (two) times daily with a meal. 07/04/17  Yes Karamalegos, Devonne Doughty, DO  loratadine (CLARITIN) 10 MG tablet Take 1 tablet (10 mg total) by mouth daily. Patient taking differently: Take 10 mg by mouth as needed.  11/14/16  Yes Arlis Porta., MD  simvastatin (ZOCOR) 20 MG tablet Take 1 tablet (20 mg total) by mouth daily. 07/04/17  Yes Karamalegos, Devonne Doughty, DO   tamsulosin (FLOMAX) 0.4 MG CAPS capsule Take 1 capsule (0.4 mg total) by mouth daily. 01/01/18  Yes Karamalegos, Devonne Doughty, DO  Multiple Vitamin (MULTIVITAMIN WITH MINERALS) TABS tablet Take 1 tablet by mouth every other day.    [provider]  ranitidine (ZANTAC) 150 MG capsule Take 150 mg by mouth daily as needed.  02/24/15   [provider]      PHYSICAL EXAMINATION:   VITAL SIGNS: Blood pressure 130/85, pulse 76, temperature 98.2 F (36.8 C), temperature source Oral, resp. rate 19, height 5\' 9"  (1.753 m), weight 84.4 kg (186 lb 1.1 oz), SpO2 98 %.  GENERAL:  72 y.o.-year-old patient lying in the bed with no acute distress.  EYES: Pupils equal, round, reactive to light and accommodation. No scleral icterus. Extraocular muscles intact.  HEENT: Head atraumatic, normocephalic. Oropharynx and nasopharynx clear.  NECK:  Supple, no jugular venous distention. No thyroid enlargement, no tenderness.  LUNGS: Normal breath sounds bilaterally, no wheezing, rales,rhonchi or crepitation. No use of accessory muscles of respiration.  CARDIOVASCULAR: S1, S2 normal. No murmurs, rubs, or gallops.  ABDOMEN: Soft, nontender, nondistended. Bowel sounds present. No organomegaly or mass.  EXTREMITIES: No pedal edema, cyanosis, or clubbing.  NEUROLOGIC: Cranial nerves II through XII are intact. Muscle strength 5/5 in all extremities. Sensation intact. Gait not checked.  PSYCHIATRIC: The patient is alert and oriented x 3.  SKIN: No obvious rash, lesion, or ulcer.   LABORATORY PANEL:   CBC Recent Labs  Lab 01/16/18 1318 01/16/18 1350  WBC 13.3*  --   HGB 14.6 12.2*  HCT 43.4 36.0*  PLT 271  --   MCV 89.9  --   MCH 30.3  --   MCHC 33.7  --   RDW 13.0  --   LYMPHSABS 2.4  --   MONOABS 0.9  --   EOSABS 0.1  --   BASOSABS 0.0  --    ------------------------------------------------------------------------------------------------------------------  Chemistries  Recent Labs  Lab  01/16/18 1318 01/16/18 1350  NA 137 136  K 4.1 3.9  CL 103 101  CO2 23  --   GLUCOSE 320* 326*  BUN 19 17  CREATININE 1.20 0.90  CALCIUM 9.2  --   AST 60*  --   ALT 29  --   ALKPHOS 83  --   BILITOT 0.8  --    ------------------------------------------------------------------------------------------------------------------ estimated creatinine clearance is 75.3 mL/min (by C-G formula based on SCr of 0.9 mg/dL). ------------------------------------------------------------------------------------------------------------------ No results for input(s): TSH, T4TOTAL, T3FREE, THYROIDAB in the last 72 hours.  Invalid input(s): FREET3   Coagulation profile Recent Labs  Lab 01/16/18 1318  INR 1.04   ------------------------------------------------------------------------------------------------------------------- No results for input(s): DDIMER in the last 72 hours. -------------------------------------------------------------------------------------------------------------------  Cardiac Enzymes Recent Labs  Lab 01/16/18 1318  TROPONINI 1.78*   ------------------------------------------------------------------------------------------------------------------ Invalid input(s): POCBNP  ---------------------------------------------------------------------------------------------------------------  Urinalysis    Component Value Date/Time   COLORURINE YELLOW 02/13/2015 1356   APPEARANCEUR CLEAR 02/13/2015 1356   LABSPEC >=1.030 02/13/2015 1356   PHURINE 5.0 02/13/2015 1356  GLUCOSEU NEGATIVE 02/13/2015 [place] 02/13/2015 1356   [place] 02/13/2015 [place] 02/13/2015 1356   PROTEINUR NEGATIVE 02/13/2015 1356   NITRITE NEGATIVE 02/13/2015 1356   LEUKOCYTESUR NEGATIVE 02/13/2015 1356     RADIOLOGY: No results found.  EKG: Orders placed or performed during the hospital encounter of 01/16/18  . EKG 12-Lead immediately post  procedure  . EKG 12-Lead  . EKG 12-Lead immediately post procedure   EKG reviewed : Afib with slow ventricular response rate with inferior ST elevation with ST depressions in V1 and V2 prior to revascularization.  IMPRESSION AND PLAN:  72 year old male patient with history of hypertension, hyperlipidemia, diet-controlled diabetes mellitus, GERD presented to the emergency room with chest pain and abnormal EKG this morning.  1 inferior STEMI with thrombotic occlusion of proximal RCA Status post revascularization and drug-eluting stent placement in the RCA Continue tirofiban  2.  Mild to moderate nonobstructive coronary artery disease involving the left coronary artery Medical management with aspirin, statin and beta blocker  3. Afib with slow ventricular rate converted spontaneously to NSR following revascularization  4. Hypertension  Continue coreg orally  5. Hyperlipidemia Continue statin orally  All the records are reviewed and case discussed with ED provider. Management plans discussed with the patient, family and they are in agreement.  CODE STATUS:    Code Status Orders  (From admission, onward)        Start     Ordered   01/16/18 1535  Full code  Continuous     01/16/18 1534    Code Status History    This patient has a current code status but no historical code status.       TOTAL TIME TAKING CARE OF THIS PATIENT: 52 minutes.    Saundra Shelling M.D on 01/16/2018 at 4:59 PM  Between 7am to 6pm - Pager - [phone] Ascom [phone]  After 6pm go to [email or web address] - password [email or web address] [facility]  Office  [phone]  CC: Primary care physician; Olin Hauser, DO

## 2018-01-16 NOTE — Progress Notes (Signed)
Name: Barry [name] MRN: [id number] DOB: 02/09/1946     CONSULTATION DATE: 01/16/2018 72 years old gentleman with history of dyslipidemia, hypertension, GERD, benign prostatic hyperplasia.  Patient is admitted to the hospitalist service with inferior STEMI, underwent successful revascularization with drug-eluting stent to RCA. Patient arrived to ICU status post PCI with drug-eluting stent to RCA, on Aggrastat.  No chest pain All history was obtained from nursing, the patient and EMR   PAST MEDICAL HISTORY :   has a past medical history of Arthritis, BPH (benign prostatic hypertrophy), GERD (gastroesophageal reflux disease), Hyperlipidemia, and Hypertension.  has a past surgical history that includes Knee surgery (Right, 09/21/2012) and Cataract extraction w/PHACO (Right, 04/12/2016). Prior to Admission medications   Medication Sig Start Date End Date Taking? Authorizing Provider  aspirin 81 MG tablet Take 81 mg by mouth 2 (two) times daily.   Yes [provider]  carvedilol (COREG) 12.5 MG tablet Take 1 tablet (12.5 mg total) by mouth 2 (two) times daily with a meal. 07/04/17  Yes Karamalegos, Devonne Doughty, DO  loratadine (CLARITIN) 10 MG tablet Take 1 tablet (10 mg total) by mouth daily. Patient taking differently: Take 10 mg by mouth as needed.  11/14/16  Yes Arlis Porta., MD  simvastatin (ZOCOR) 20 MG tablet Take 1 tablet (20 mg total) by mouth daily. 07/04/17  Yes Karamalegos, Devonne Doughty, DO  tamsulosin (FLOMAX) 0.4 MG CAPS capsule Take 1 capsule (0.4 mg total) by mouth daily. 01/01/18  Yes Karamalegos, Devonne Doughty, DO  Multiple Vitamin (MULTIVITAMIN WITH MINERALS) TABS tablet Take 1 tablet by mouth every other day.    [provider]  ranitidine (ZANTAC) 150 MG capsule Take 150 mg by mouth daily as needed.  02/24/15   [provider]   No Known Allergies  FAMILY HISTORY:  family history includes Diabetes in his mother; Hyperlipidemia in his paternal  aunt; Melanoma in his brother; Prostate cancer in his brother; Stroke in his mother. SOCIAL HISTORY:  reports that he has never smoked. He has quit using smokeless tobacco. His smokeless tobacco use included chew. He reports that he does not drink alcohol or use drugs.  REVIEW OF SYSTEMS:   Unable to obtain due to critical illness   VITAL SIGNS: Temp:  [98.2 F (36.8 C)] 98.2 F (36.8 C) (04/02 1533) Pulse Rate:  [70-80] 80 (04/02 1700) Resp:  [17-20] 20 (04/02 1700) BP: (114-136)/(75-85) 114/75 (04/02 1700) SpO2:  [97 %-98 %] 97 % (04/02 1700) Weight:  [84.4 kg (186 lb 1.1 oz)-84.8 kg (187 lb)] 84.4 kg (186 lb 1.1 oz) (04/02 1533)  Physical Examination:  Awake and oriented there is no acute neurological deficits On nasal cannula with no distress and able to talk in full sentences.  Bilateral equal air entry and no adventitious sounds S1 and S2 are audible with no murmur Benign abdomen was normal peristalsis No leg edema, right radial artery site of cannulation was no hematoma and good peripheral circulation  ASSESSMENT / PLAN:  Inferior STEMI status post revascularization with drug-eluting stent to RCA.  Mild to moderate nonobstructive coronary artery disease involving LCA advised for medical management with aspirin, Brilinta, statins and beta-blockers  Paroxysmal atrial fibrillation with slow ventricular response rate spontaneously converted to NSR. -Rate control and management as per cardiology  Hyperglycemia with no known history of diabetes -Optimize glycemic control and monitor hemoglobin A1c  Hypertension -Optimize antihypertensives and monitor hemodynamics  Full code  Supportive care  Care time 35 minutes

## 2018-01-17 ENCOUNTER — Encounter: Payer: Self-pay | Admitting: Internal Medicine

## 2018-01-17 LAB — CBC WITH DIFFERENTIAL/PLATELET
BASOS ABS: 0 10*3/uL (ref 0–0.1)
Basophils Relative: 0 %
Eosinophils Absolute: 0 10*3/uL (ref 0–0.7)
Eosinophils Relative: 0 %
HEMATOCRIT: 36.9 % — AB (ref 40.0–52.0)
HEMOGLOBIN: 13 g/dL (ref 13.0–18.0)
LYMPHS PCT: 12 %
Lymphs Abs: 1.6 10*3/uL (ref 1.0–3.6)
MCH: 31.2 pg (ref 26.0–34.0)
MCHC: 35.2 g/dL (ref 32.0–36.0)
MCV: 88.8 fL (ref 80.0–100.0)
MONOS PCT: 12 %
Monocytes Absolute: 1.6 10*3/uL — ABNORMAL HIGH (ref 0.2–1.0)
NEUTROS ABS: 10 10*3/uL — AB (ref 1.4–6.5)
NEUTROS PCT: 76 %
Platelets: 206 10*3/uL (ref 150–440)
RBC: 4.15 MIL/uL — ABNORMAL LOW (ref 4.40–5.90)
RDW: 12.7 % (ref 11.5–14.5)
WBC: 13.3 10*3/uL — ABNORMAL HIGH (ref 3.8–10.6)

## 2018-01-17 LAB — BASIC METABOLIC PANEL
Anion gap: 8 (ref 5–15)
BUN: 20 mg/dL (ref 6–20)
CHLORIDE: 107 mmol/L (ref 101–111)
CO2: 23 mmol/L (ref 22–32)
Calcium: 8.5 mg/dL — ABNORMAL LOW (ref 8.9–10.3)
Creatinine, Ser: 1.02 mg/dL (ref 0.61–1.24)
GFR calc Af Amer: >60 mL/min (ref >60)
GFR calc non Af Amer: >60 mL/min (ref >60)
Glucose, Bld: 135 mg/dL — ABNORMAL HIGH (ref 65–99)
POTASSIUM: 3.4 mmol/L — AB (ref 3.5–5.1)
SODIUM: 138 mmol/L (ref 135–145)

## 2018-01-17 LAB — ECHOCARDIOGRAM COMPLETE
HEIGHTINCHES: 69 in
Weight: 2977.09 oz

## 2018-01-17 LAB — GLUCOSE, CAPILLARY
GLUCOSE-CAPILLARY: 139 mg/dL — AB (ref 65–99)
GLUCOSE-CAPILLARY: 167 mg/dL — AB (ref 65–99)
Glucose-Capillary: 153 mg/dL — ABNORMAL HIGH (ref 65–99)
Glucose-Capillary: 125 mg/dL — ABNORMAL HIGH (ref 65–99)

## 2018-01-17 LAB — MAGNESIUM: MAGNESIUM: 2 mg/dL (ref 1.7–2.4)

## 2018-01-17 LAB — PHOSPHORUS: PHOSPHORUS: 3.2 mg/dL (ref 2.5–4.6)

## 2018-01-17 LAB — TROPONIN I
Troponin I: 54.65 ng/mL (ref <0.03)
Troponin I: 30.57 ng/mL (ref <0.03)

## 2018-01-17 MED ORDER — TAMSULOSIN HCL 0.4 MG PO CAPS
0.4000 mg | ORAL_CAPSULE | Freq: Every day | ORAL | Status: DC
  Administered 2018-01-17 – 2018-01-18 (×2): 0.4 mg via ORAL
  Filled 2018-01-17 (×2): qty 1

## 2018-01-17 MED ORDER — POTASSIUM CHLORIDE CRYS ER 20 MEQ PO TBCR
40.0000 meq | EXTENDED_RELEASE_TABLET | Freq: Once | ORAL | Status: AC
  Administered 2018-01-17: 40 meq via ORAL
  Filled 2018-01-17: qty 2

## 2018-01-17 MED ORDER — CARVEDILOL 3.125 MG PO TABS
3.1250 mg | ORAL_TABLET | Freq: Two times a day (BID) | ORAL | Status: DC
  Administered 2018-01-17 – 2018-01-18 (×3): 3.125 mg via ORAL
  Filled 2018-01-17 (×3): qty 1

## 2018-01-17 NOTE — Progress Notes (Signed)
Patient s/p PCI, hemodynamically stable on medical management as per cardiology: ASA + Brilinta + Statin + BB He can be transferred out of ICU Please call critical care if needed.

## 2018-01-17 NOTE — Progress Notes (Signed)
72 year old male with hx of HTN, HLD, and diet controlled DM2, admitted with inferior STEMI  - s/p PCI to proximal/mid RCA.    Patient on the following medications:  ASA, Brilinta, Atorvastatin and Coreg.  Echo pending.    Patient lying in bed with the [place] elevated.    "Heart Attack Bouncing Back" booklet given and reviewed with patient. Discussed the definition of CAD. Reviewed the location of CAD and where stent was placed. Informed patient he will be given a stent card. Explained the purpose of the stent card. Instructed patient to keep stent card in his wallet.  ? Discussed modifiable risk factors including controlling blood pressure, cholesterol, and blood sugar; following heart healthy diet; maintaining healthy weight; exercise; and smoking cessation, if applicable. ?Note: Patient is a NEVER smoker.    ? Discussed cardiac medications including rationale for taking, mechanisms of action, and side effects. Stressed the importance of taking medications as prescribed.  ? Discussed emergency plan for heart attack symptoms. Patient verbalized understanding of need to call 911 and not to drive himself or have his family drive him to the ER if having cardiac symptoms / chest pain.  ? Heart healthy diet of low sodium, low fat, low cholesterol heart healthy diet discussed. Information on diet provided. Note:  Dietitian Consultation has been entered for diet education.   ? Smoking Cessation - Patient is a NEVER smoker.    Exercise - Benefits of exercised discussed. Informed patient cardiologist has referred him to outpatient Cardiac Rehab. An overview of the program was provided. Brochure, informational letter, class and orientation times, and CPT billing codes given to patient.  Patient is uncertain about participating, as he has a torn ligament in his right leg.  He has had arthroscopic surgery in the past.  Upon looking in [facility] it appears he was seen by orthopedist in June 2017.  Patient reports that  he has not followed up with ortho since his arthroscopic procedure at which time the orthopedist informed him that he had a torn ligament.  His right leg / torn ligament bothers him at night.  Patient also reports that his wife is not well and he is her primary caregiver.  Their adult daughter lives with them, but she works.  His wife will be undergoing cataract surgery in the near future.  Patient will review the information this RN provided about Cardiac Rehab and will talk with his daughter.   ? Patient appreciative of the information.  ? Roanna Epley, RN, BSN, [place]  [facility]  Cardiovascular & Pulmonary Nurse Navigator  Direct Line: [phone]  Department Phone #: [phone] Fax: [phone]  Email Address: [email or web address]

## 2018-01-17 NOTE — Progress Notes (Signed)
Patient ID: Barry [name], male   DOB: [birth year], 72 y.o.   MRN: [id number]  [facility] PROGRESS NOTE  SHAYN MADOLE VOZ:[id number] DOB: 11-23-1945 DOA: 01/16/2018 PCP: Olin Hauser, DO  HPI/Subjective: Patient without chest pain or shortness of breath.  Feels okay.  Objective: Vitals:   01/17/18 0700 01/17/18 0800  BP: 117/84 106/75  Pulse: 76 85  Resp: 15 19  Temp:  98.3 F (36.8 C)  SpO2: 97% 97%    Filed Weights   01/16/18 1321 01/16/18 1533  Weight: 84.8 kg (187 lb) 84.4 kg (186 lb 1.1 oz)    ROS: Review of Systems  Constitutional: Negative for chills and fever.  Eyes: Negative for blurred vision.  Respiratory: Negative for cough and shortness of breath.   Cardiovascular: Negative for chest pain.  Gastrointestinal: Negative for abdominal pain, constipation, diarrhea, nausea and vomiting.  Genitourinary: Negative for dysuria.  Musculoskeletal: Negative for joint pain.  Neurological: Negative for dizziness and headaches.   Exam: Physical Exam  Constitutional: He is oriented to person, place, and time.  HENT:  Nose: No mucosal edema.  Mouth/Throat: No oropharyngeal exudate or posterior oropharyngeal edema.  Eyes: Pupils are equal, round, and reactive to light. Conjunctivae, EOM and lids are normal.  Neck: No JVD present. Carotid bruit is not present. No edema present. No thyroid mass and no thyromegaly present.  Cardiovascular: S1 normal and S2 normal. Exam reveals no gallop.  No murmur heard. Pulses:      Dorsalis pedis pulses are 2+ on the right side, and 2+ on the left side.  Respiratory: No respiratory distress. He has no wheezes. He has no rhonchi. He has no rales.  GI: Soft. Bowel sounds are normal. There is no tenderness.  Musculoskeletal:       Right ankle: He exhibits no swelling.       Left ankle: He exhibits no swelling.  Lymphadenopathy:    He has no cervical adenopathy.  Neurological: He is alert and oriented to person, place, and  time. No cranial nerve deficit.  Skin: Skin is warm. No rash noted. Nails show no clubbing.  Bruising right wrist  Psychiatric: He has a normal mood and affect.      Data Reviewed: Basic Metabolic Panel: Recent Labs  Lab 01/16/18 1318 01/16/18 1350 01/17/18 0052  NA 137 136 138  K 4.1 3.9 3.4*  CL 103 101 107  CO2 23  --  23  GLUCOSE 320* 326* 135*  BUN 19 17 20   CREATININE 1.20 0.90 1.02  CALCIUM 9.2  --  8.5*  MG  --   --  2.0  PHOS  --   --  3.2   Liver Function Tests: Recent Labs  Lab 01/16/18 1318  AST 60*  ALT 29  ALKPHOS 83  BILITOT 0.8  PROT 6.9  ALBUMIN 4.3   CBC: Recent Labs  Lab 01/16/18 1318 01/16/18 1350 01/17/18 0052  WBC 13.3*  --  13.3*  NEUTROABS 9.8*  --  10.0*  HGB 14.6 12.2* 13.0  HCT 43.4 36.0* 36.9*  MCV 89.9  --  88.8  PLT 271  --  206   Cardiac Enzymes: Recent Labs  Lab 01/16/18 1318 01/16/18 1855 01/17/18 0052 01/17/18 0707  TROPONINI 1.78* 48.06* 54.65* 30.57*    CBG: Recent Labs  Lab 01/16/18 1529 01/16/18 1711 01/16/18 2141 01/17/18 0747 01/17/18 1148  GLUCAP 233* 186* 182* 153* 125*    Recent Results (from the past 240 hour(s))  MRSA PCR Screening  Status: None   Collection Time: 01/16/18  3:37 PM  Result Value Ref Range Status   MRSA by PCR NEGATIVE NEGATIVE Final    Comment:        The GeneXpert MRSA Assay (FDA approved for NASAL specimens only), is one component of a comprehensive MRSA colonization surveillance program. It is not intended to diagnose MRSA infection nor to guide or monitor treatment for MRSA infections. Performed at [facility], [address] [place],  [address]      Scheduled Meds: . aspirin EC  81 mg Oral Daily  . atorvastatin  80 mg Oral q1800  . carvedilol  3.125 mg Oral BID WC  . enoxaparin (LOVENOX) injection  40 mg Subcutaneous Q24H  . insulin aspart  0-15 Units Subcutaneous TID WC  . insulin aspart  0-5 Units Subcutaneous QHS  . sodium  chloride flush  3 mL Intravenous Q12H  . tamsulosin  0.4 mg Oral Daily  . ticagrelor  90 mg Oral BID   Continuous Infusions: . sodium chloride    . sodium chloride      Assessment/Plan:  1. ST elevation myocardial infarction.  Patient had a stent in the RCA.  Patient on aspirin, Brilinta, Coreg and atorvastatin. 2. Atrial fibrillation with slow ventricular response converted to normal sinus rhythm after revascularization 3. Essential hypertension on Coreg 4. Hyperlipidemia unspecified on high intensity statin.  LDL 77 and goal less than 70.  5. Impaired fasting glucose.  Last hemoglobin A1c 6.423 days ago.  Patient is not a diabetic at this time.  On sliding scale insulin. 6. BPH on Rapaflo at home on Flomax here. 7. Asked pharmacist in the ICU to get a medication reconciliation. 8. Spoke with my pharmacist about the patient's concern about the cost of Brilinta.  She will look into the cost by speaking with the patient's outpatient pharmacy.  Code Status:     Code Status Orders  (From admission, onward)        Start     Ordered   01/16/18 1535  Full code  Continuous     01/16/18 1534    Code Status History    This patient has a current code status but no historical code status.     Family Communication: Spoke with family at the bedside Disposition Plan: Potentially home tomorrow  Consultants:  Cardiology  Time spent: 28 minutes  [place]

## 2018-01-17 NOTE — Care Management (Signed)
Voucher provided to patient for Brilinta assistance.

## 2018-01-17 NOTE — Progress Notes (Signed)
Pt being transferred to room 246. Report called to [place], Therapist, sports. Pt and belongings transferred to room 246 without incident.

## 2018-01-17 NOTE — Progress Notes (Signed)
Progress Note  Patient Name: Barry [name] Date of Encounter: 01/17/2018  Primary Cardiologist: Esmond Plants, MD PhD  Subjective   Feels well this AM. No CP, shortness of breath. No pain at right radial arteriotomy.  Inpatient Medications    Scheduled Meds: . aspirin EC  81 mg Oral Daily  . atorvastatin  80 mg Oral q1800  . carvedilol  3.125 mg Oral BID WC  . enoxaparin (LOVENOX) injection  40 mg Subcutaneous Q24H  . insulin aspart  0-15 Units Subcutaneous TID WC  . insulin aspart  0-5 Units Subcutaneous QHS  . sodium chloride flush  3 mL Intravenous Q12H  . ticagrelor  90 mg Oral BID   Continuous Infusions: . sodium chloride    . sodium chloride     PRN Meds: sodium chloride, acetaminophen, morphine injection, ondansetron (ZOFRAN) IV, sodium chloride flush   Vital Signs    Vitals:   01/17/18 0300 01/17/18 0400 01/17/18 0500 01/17/18 0600  BP: (!) 117/92 108/73 123/83 121/71  Pulse: 79 73 77 76  Resp: 14 15 19 17   Temp:  99 F (37.2 C)    TempSrc:  Oral    SpO2: 96% 97% 98% 97%  Weight:      Height:        Intake/Output Summary (Last 24 hours) at 01/17/2018 0734 Last data filed at 01/17/2018 0451 Gross per 24 hour  Intake 204.33 ml  Output 550 ml  Net -345.67 ml   Filed Weights   01/16/18 1321 01/16/18 1533  Weight: 187 lb (84.8 kg) 186 lb 1.1 oz (84.4 kg)    Telemetry    NSR with one episodes of NSVT (4 beats). No a-fib - Personally Reviewed  ECG    NSR with non-specific ST/T changes, most pronounced in inferior/posterior leads - Personally Reviewed  Physical Exam   GEN: No acute distress.   Neck: No JVD Cardiac: RRR, no murmurs, rubs, or gallops.  Respiratory: Clear to auscultation bilaterally. GI: Soft, nontender, non-distended  MS: No edema; No deformity. Right forearm bruising noted without hematoma. 2+ right radial artery pulse. Neuro:  Nonfocal  Psych: Normal affect   Labs    Chemistry Recent Labs  Lab 01/16/18 1318 01/16/18 1350  01/17/18 0052  NA 137 136 138  K 4.1 3.9 3.4*  CL 103 101 107  CO2 23  --  23  GLUCOSE 320* 326* 135*  BUN 19 17 20   CREATININE 1.20 0.90 1.02  CALCIUM 9.2  --  8.5*  PROT 6.9  --   --   ALBUMIN 4.3  --   --   AST 60*  --   --   ALT 29  --   --   ALKPHOS 83  --   --   BILITOT 0.8  --   --   GFRNONAA 59*  --  >60  GFRAA >60  --  >60  ANIONGAP 11  --  8     Hematology Recent Labs  Lab 01/16/18 1318 01/16/18 1350 01/17/18 0052  WBC 13.3*  --  13.3*  RBC 4.83  --  4.15*  HGB 14.6 12.2* 13.0  HCT 43.4 36.0* 36.9*  MCV 89.9  --  88.8  MCH 30.3  --  31.2  MCHC 33.7  --  35.2  RDW 13.0  --  12.7  PLT 271  --  206    Cardiac Enzymes Recent Labs  Lab 01/16/18 1318 01/16/18 1855 01/17/18 0052  TROPONINI 1.78* 48.06* 54.65*  No results for input(s): TROPIPOC in the last 168 hours.   BNPNo results for input(s): BNP, PROBNP in the last 168 hours.   DDimer No results for input(s): DDIMER in the last 168 hours.   Radiology    No results found.  Cardiac Studies   LHC/PCI (01/16/18): 1. Inferior STEMI with thrombotic occlusion of the proximal RCA. 2. Moderate, non-obstructive coronary artery disease involving the LAD and LCx. The proximal/mid LAD is aneurysmal. 3. Mildly reduced left ventricular contraction with basal and mid inferior akinesis. LVEF 45-50%. 4. Mildly elevated left ventricular filling pressure (LVEDP ~20 mmHg). 5. Successful primary PCI to proximal/mid RCA using a Xience Sierra 3.0 x 38 mm drug-eluting stent with 0% residual stenosis and TIMI-3 flow. 6. Atrial fibrillation noted at start of procedure, which spontaneously converted to sinus rhythm after reestablishment of flow in the right coronary artery.  Patient Profile     72 y.o. male man with history of HTN, HLD, and diet controlled DM2, admitted with inferior STEMI status post primary PCI to the proximal/mid RCA.  Assessment & Plan    Inferior STEMI No further chest pain. Inferior ST  elevations improved after PCI. Troponin has trended up to 55; repeat pending this AM.  Continue ASA and ticagrelor for at least 12 months.  Start carvedilol 3.125 mg BID.  Continue atorvastatin 80 mg daily.  F/u echo.  Medical management of non-critical LAD/LCx disease.  Outpatient cardiac rehab (consult order has been placed).  ICM Mildly reduced LVEF by LVgram. Barry [name] appears euvolemic on exam.  F/u echo.  Start carvedilol 3.125 mg BID.  Maintain net even fluid balance.  PAF Noted prior to PCI. No further a-fib on monitor.  Continue telemetry monitoring.  Consider outpatient event monitor to screen for a-fib as an outpatient.  If recurrence of atrial fibrillation, Barry [name] will need to be anticoagulated given CHADSVASC score of at least 4. In this case, I would switch ticagrelor to clopidogrel and continue apixaban, clopidogrel, and ASA x 1 month, followed by long-term apixaban and clopidogrel.  Hypertension BP normal.  Start low-dose carvedilol; titrate as tolerated.  Hyperlipidemia  Continue high-intensity statin therapy.  DM2  Continue SSI.  Further management per internal medicine.  Disposition  Anticipate transfer to telemetry this afternoon, if remains hemodynamically/electrically stable.  Likely discharge home tomorrow afternoon if patient is able to ambulate without difficulty.  For questions or updates, please contact [place] Please consult [email or web address] for contact info under [facility].     Signed, Nelva Bush, MD  01/17/2018, 7:34 AM

## 2018-01-18 ENCOUNTER — Telehealth: Payer: Self-pay | Admitting: Cardiovascular Disease

## 2018-01-18 LAB — BASIC METABOLIC PANEL
Anion gap: 7 (ref 5–15)
BUN: 18 mg/dL (ref 6–20)
CO2: 24 mmol/L (ref 22–32)
Calcium: 8.4 mg/dL — ABNORMAL LOW (ref 8.9–10.3)
Chloride: 110 mmol/L (ref 101–111)
Creatinine, Ser: 0.91 mg/dL (ref 0.61–1.24)
GFR calc Af Amer: >60 mL/min (ref >60)
GLUCOSE: 136 mg/dL — AB (ref 65–99)
POTASSIUM: 3.6 mmol/L (ref 3.5–5.1)
Sodium: 141 mmol/L (ref 135–145)

## 2018-01-18 LAB — CALCIUM, IONIZED: Calcium, Ionized, Serum: 5 mg/dL (ref 4.5–5.6)

## 2018-01-18 LAB — GLUCOSE, CAPILLARY: Glucose-Capillary: 130 mg/dL — ABNORMAL HIGH (ref 65–99)

## 2018-01-18 MED ORDER — ATORVASTATIN CALCIUM 80 MG PO TABS: 80.0000 mg | ORAL_TABLET | Freq: Every day | ORAL | 0 refills | Status: DC

## 2018-01-18 MED ORDER — CARVEDILOL 3.125 MG PO TABS: 3.1250 mg | ORAL_TABLET | Freq: Two times a day (BID) | ORAL | 0 refills | Status: DC

## 2018-01-18 MED ORDER — LORATADINE 10 MG PO TABS: 10.0000 mg | ORAL_TABLET | ORAL | Status: DC | PRN

## 2018-01-18 MED ORDER — TICAGRELOR 90 MG PO TABS: 90.0000 mg | ORAL_TABLET | Freq: Two times a day (BID) | ORAL | 0 refills | Status: DC

## 2018-01-18 NOTE — Telephone Encounter (Signed)
Patient currently admitted. TCM call- 4/5

## 2018-01-18 NOTE — Telephone Encounter (Signed)
TCM....  Patient is being discharged   They saw Dr End   They are scheduled to see Dr Rockey Situ   on 01/29/18   They were seen for Stemi   They need to be seen within 1 week   Please call

## 2018-01-18 NOTE — Progress Notes (Signed)
Ambulated patient around nurses station and tolerated welll.

## 2018-01-18 NOTE — Progress Notes (Signed)
Nutrition Education Note  RD consulted for nutrition education regarding a Heart Healthy diet.   Lipid Panel     Component Value Date/Time   CHOL 142 01/16/2018 1318   CHOL 154 11/30/2015 0937   TRIG 129 01/16/2018 1318   HDL 39 (L) 01/16/2018 1318   HDL 43 11/30/2015 0937   CHOLHDL 3.6 01/16/2018 1318   VLDL 26 01/16/2018 1318   LDLCALC 77 01/16/2018 1318   LDLCALC 69 06/27/2017 0834    RD provided "Heart Healthy Nutrition Therapy" handout from the Academy of Nutrition and Dietetics. Reviewed patient's dietary recall. Provided examples on ways to decrease sodium and fat intake in diet. Discouraged intake of processed foods and use of salt shaker. Encouraged fresh fruits and vegetables as well as whole grain sources of carbohydrates to maximize fiber intake. Teach back method used.  Expect good compliance.  Body mass index is 26.42 kg/m. Pt meets criteria for overweight based on current BMI.  Current diet order is HH, patient is consuming approximately 90% of meals at this time. Labs and medications reviewed. No further nutrition interventions warranted at this time. RD contact information provided. If additional nutrition issues arise, please re-consult RD.  Koleen Distance MS, RD, LDN Pager #- [phone] After Hours Pager: [phone]

## 2018-01-18 NOTE — Discharge Summary (Signed)
[place] [facility] [place] NAME: Barry [name]    MR#:  [id number]  DATE OF BIRTH:  04/29/1946  DATE OF ADMISSION:  01/16/2018 ADMITTING PHYSICIAN: Nelva Bush, MD  DATE OF DISCHARGE: 01/18/2018  1:02 PM  PRIMARY CARE PHYSICIAN: Olin Hauser, DO    ADMISSION DIAGNOSIS:  ST elevation myocardial infarction (STEMI), unspecified artery ([place]) [I21.3] STEMI involving right coronary artery ([place]) [I21.11]  DISCHARGE DIAGNOSIS:  Principal Problem:   STEMI (ST elevation myocardial infarction) ([place]) Active Problems:   STEMI involving right coronary artery ([place])   SECONDARY DIAGNOSIS:   Past Medical History:  Diagnosis Date  . Arthritis   . BPH (benign prostatic hypertrophy)   . GERD (gastroesophageal reflux disease)   . Hyperlipidemia   . Hypertension     HOSPITAL COURSE:   1.  ST elevation myocardial infarction.  The patient had a stent in the RCA.  Patient on aspirin, Brilinta, Coreg and atorvastatin.  Patient will be sent to cardiac rehab.  Follow-up with cardiology 1 week. 2.  Atrial fibrillation with slow ventricular response converted to normal sinus rhythm after revascularization. 3.  Essential hypertension on Coreg 4.  Hyperlipidemia unspecified on high intensity statin.  LDL 77 and goal less than 70. 5.  Impaired fasting glucose.  Last hemoglobin A1c is 6.4, 23 days ago.  Diet control only.  Patient is not a diabetic at this point. 6.  BPH on Rapaflo at home which will be changed over to Flomax secondary to insurance purposes.  DISCHARGE CONDITIONS:   Satisfactory  CONSULTS OBTAINED:  Treatment Team:  Cassandria Santee, MD  DRUG ALLERGIES:  No Known Allergies  DISCHARGE MEDICATIONS:   Allergies as of 01/18/2018   No Known Allergies     Medication List    STOP taking these medications   simvastatin 20 MG tablet Commonly known as:  ZOCOR     TAKE these medications   aspirin 81 MG tablet Take 81 mg by mouth 2  (two) times daily.   atorvastatin 80 MG tablet Commonly known as:  LIPITOR Take 1 tablet (80 mg total) by mouth daily at 6 PM.   carvedilol 3.125 MG tablet Commonly known as:  COREG Take 1 tablet (3.125 mg total) by mouth 2 (two) times daily with a meal. What changed:    medication strength  how much to take   loratadine 10 MG tablet Commonly known as:  CLARITIN Take 1 tablet (10 mg total) by mouth as needed.   multivitamin with minerals Tabs tablet Take 1 tablet by mouth every other day.   ranitidine 150 MG capsule Commonly known as:  ZANTAC Take 150 mg by mouth daily as needed.   tamsulosin 0.4 MG Caps capsule Commonly known as:  FLOMAX Take 1 capsule (0.4 mg total) by mouth daily.   ticagrelor 90 MG Tabs tablet Commonly known as:  BRILINTA Take 1 tablet (90 mg total) by mouth 2 (two) times daily.        DISCHARGE INSTRUCTIONS:   Follow up PMD six days Cardiology one week  If you experience worsening of your admission symptoms, develop shortness of breath, life threatening emergency, suicidal or homicidal thoughts you must seek medical attention immediately by calling 911 or calling your MD immediately  if symptoms less severe.  You Must read complete instructions/literature along with all the possible adverse reactions/side effects for all the Medicines you take and that have been prescribed to you. Take any new Medicines after you have  completely understood and accept all the possible adverse reactions/side effects.   Please note  You were cared for by a hospitalist during your hospital stay. If you have any questions about your discharge medications or the care you received while you were in the hospital after you are discharged, you can call the unit and asked to speak with the hospitalist on call if the hospitalist that took care of you is not available. Once you are discharged, your primary care physician will handle any further medical issues. Please note  that NO REFILLS for any discharge medications will be authorized once you are discharged, as it is imperative that you return to your primary care physician (or establish a relationship with a primary care physician if you do not have one) for your aftercare needs so that they can reassess your need for medications and monitor your lab values.    Today   CHIEF COMPLAINT:   Chief Complaint  Patient presents with  . Code STEMI    HISTORY OF PRESENT ILLNESS:  Barry [name]  is a 72 y.o. male admitted with STEMI   VITAL SIGNS:  Blood pressure 125/82, pulse 80, temperature 98.3 F (36.8 C), temperature source Oral, resp. rate 18, height 5\' 9"  (1.753 m), weight 81.1 kg (178 lb 14.4 oz), SpO2 94 %.    PHYSICAL EXAMINATION:  GENERAL:  72 y.o.-year-old patient lying in the bed with no acute distress.  EYES: Pupils equal, round, reactive to light and accommodation. No scleral icterus. Extraocular muscles intact.  HEENT: Head atraumatic, normocephalic. Oropharynx and nasopharynx clear.  NECK:  Supple, no jugular venous distention. No thyroid enlargement, no tenderness.  LUNGS: Normal breath sounds bilaterally, no wheezing, rales,rhonchi or crepitation. No use of accessory muscles of respiration.  CARDIOVASCULAR: S1, S2 normal. No murmurs, rubs, or gallops.  ABDOMEN: Soft, non-tender, non-distended. Bowel sounds present. No organomegaly or mass.  EXTREMITIES: No pedal edema, cyanosis, or clubbing.  NEUROLOGIC: Cranial nerves II through XII are intact. Muscle strength 5/5 in all extremities. Sensation intact. Gait not checked.  PSYCHIATRIC: The patient is alert and oriented x 3.  SKIN: No obvious rash, lesion, or ulcer.   DATA REVIEW:   CBC Recent Labs  Lab 01/17/18 0052  WBC 13.3*  HGB 13.0  HCT 36.9*  PLT 206    Chemistries  Recent Labs  Lab 01/16/18 1318  01/17/18 0052 01/18/18 0336  NA 137   < > 138 141  K 4.1   < > 3.4* 3.6  CL 103   < > 107 110  CO2 23  --  23 24   GLUCOSE 320*   < > 135* 136*  BUN 19   < > 20 18  CREATININE 1.20   < > 1.02 0.91  CALCIUM 9.2  --  8.5* 8.4*  MG  --   --  2.0  --   AST 60*  --   --   --   ALT 29  --   --   --   ALKPHOS 83  --   --   --   BILITOT 0.8  --   --   --    < > = values in this interval not displayed.    Cardiac Enzymes Recent Labs  Lab 01/17/18 0707  TROPONINI 30.57*    Microbiology Results  Results for orders placed or performed during the hospital encounter of 01/16/18  MRSA PCR Screening     Status: None   Collection Time: 01/16/18  3:37 PM  Result Value Ref Range Status   MRSA by PCR NEGATIVE NEGATIVE Final    Comment:        The GeneXpert MRSA Assay (FDA approved for NASAL specimens only), is one component of a comprehensive MRSA colonization surveillance program. It is not intended to diagnose MRSA infection nor to guide or monitor treatment for MRSA infections. Performed at [facility], [address] [place],  [address]       Management plans discussed with the patient, family and they are in agreement.  CODE STATUS:     Code Status Orders  (From admission, onward)        Start     Ordered   01/16/18 1535  Full code  Continuous     01/16/18 1534    Code Status History    This patient has a current code status but no historical code status.      TOTAL TIME TAKING CARE OF THIS PATIENT: 35 minutes.    Loletha Grayer M.D on 01/18/2018 at 3:51 PM  Between 7am to 6pm - Pager - [phone]  After 6pm go to [email or web address] - Proofreader  [facility] Office  [phone]  CC: Primary care physician; Olin Hauser, DO

## 2018-01-18 NOTE — Care Management Important Message (Signed)
Important Message  Patient Details  Name: Barry [name] MRN: [id number] Date of Birth: 07-04-1946   Medicare Important Message Given:  Yes Patient transferred to 2A last night.  Discharge order for today and CM found that there is no initial singed IM in the medical record.  Probably due to the emergent health status on presentation.  Notice signed, one given to patient and the other to HIM for scanning   Katrina Stack, RN 01/18/2018, 12:01 PM

## 2018-01-18 NOTE — Progress Notes (Addendum)
Barry [name] to be D/C'd Home per MD order.  Discussed prescriptions and follow up appointments with the patient. Prescriptions given to patient, medication list explained in detail. Provided patient with vascular discharge instructions. Pt verbalized understanding.  Allergies as of 01/18/2018   No Known Allergies     Medication List    STOP taking these medications   simvastatin 20 MG tablet Commonly known as:  ZOCOR     TAKE these medications   aspirin 81 MG tablet Take 81 mg by mouth 2 (two) times daily.   atorvastatin 80 MG tablet Commonly known as:  LIPITOR Take 1 tablet (80 mg total) by mouth daily at 6 PM.   carvedilol 3.125 MG tablet Commonly known as:  COREG Take 1 tablet (3.125 mg total) by mouth 2 (two) times daily with a meal. What changed:    medication strength  how much to take   loratadine 10 MG tablet Commonly known as:  CLARITIN Take 1 tablet (10 mg total) by mouth as needed.   multivitamin with minerals Tabs tablet Take 1 tablet by mouth every other day.   ranitidine 150 MG capsule Commonly known as:  ZANTAC Take 150 mg by mouth daily as needed.   tamsulosin 0.4 MG Caps capsule Commonly known as:  FLOMAX Take 1 capsule (0.4 mg total) by mouth daily.   ticagrelor 90 MG Tabs tablet Commonly known as:  BRILINTA Take 1 tablet (90 mg total) by mouth 2 (two) times daily.       Vitals:   01/18/18 0345 01/18/18 0748  BP: 136/74 125/82  Pulse: 85 80  Resp: 18 18  Temp: 98 F (36.7 C) 98.3 F (36.8 C)  SpO2: 96% 94%    Tele box removed and returned. Skin clean, dry and intact without evidence of skin break down, no evidence of skin tears noted. IV catheter discontinued intact. Site without signs and symptoms of complications. Dressing and pressure applied. Pt denies pain at this time. No complaints noted.  An After Visit Summary was printed and given to the patient. Patient escorted via [place], and D/C home via private auto.  Barry [name]

## 2018-01-18 NOTE — Plan of Care (Signed)
  Problem: Education: Goal: Knowledge of General Education information will improve Outcome: Progressing   Problem: Clinical Measurements: Goal: Ability to maintain clinical measurements within normal limits will improve Outcome: Progressing Goal: Diagnostic test results will improve Outcome: Progressing Goal: Cardiovascular complication will be avoided Outcome: Progressing   Problem: Activity: Goal: Risk for activity intolerance will decrease Outcome: Progressing   Problem: Clinical Measurements: Goal: Diagnostic test results will improve Outcome: Progressing Goal: Cardiovascular complication will be avoided Outcome: Progressing

## 2018-01-18 NOTE — Progress Notes (Signed)
Called Rx for Brilinta into pt pharmacy, [facility]- copay was $45. Dr. Leslye Peer informed. He was going to tell pt. Pt concerned over price. Coupon already provided to pt when he was in the ICU  Ramond Dial, Pharm.D, BCPS Clinical Pharmacist

## 2018-01-19 ENCOUNTER — Telehealth: Payer: Self-pay

## 2018-01-19 NOTE — Telephone Encounter (Signed)
Transition Care Management Follow-up Telephone Call   Date discharged? 01/18/2018   How have you been since you were released from the hospital? "just been taking it easy today"   Do you understand why you were in the hospital? yes   Do you understand the discharge instructions? yes   Where were you discharged to? home   Items Reviewed:  Medications reviewed: yes  Allergies reviewed: yes  Dietary changes reviewed: yes  Referrals reviewed: yes   Functional Questionnaire:   Activities of Daily Living (ADLs):   He states they are independent in the following: ambulation, bathing and hygiene, feeding, continence, grooming, toileting and dressing States they require assistance with the following: none   Any transportation issues/concerns?: no   Any patient concerns? Yes, has had some nose bleeds since returning home.    Confirmed importance and date/time of follow-up visits scheduled yes  Provider Appointment booked with Dr.Karamalegos on 01/22/2018 at 9:20pm   Confirmed with patient if condition begins to worsen call PCP or go to the ER.  Patient was given the office number and encouraged to call back with question or concerns.  : yes

## 2018-01-19 NOTE — Telephone Encounter (Signed)
I have made the 1st attempt to contact the patient or family member in charge, in order to follow up from recently being discharged from the hospital. I left a message on voicemail but I will make another attempt at a different time.   Direct call back- [phone] 

## 2018-01-20 NOTE — Telephone Encounter (Signed)
Patient recently hospitalized for MI s/p stent placement, discharged 4/4. Yesterday evening began having diarrhea, how s/p 4 episodes of watery stools. No nausea/vomiting and able to tolerate fluid intake and food. Recent changes to medications include addition of brilinta and change from simvastatin to atorvastatin 80mg . Wife instructed to continue to observe today and encourage fluid intake. Differential includes medication related diarrhea (both brilinta and atorvastatin can cause diarrhea) vs. Viral gastroenteritis vs C.diff infection (given recent hospitalization). Wife will call the office on Monday if diarrhea continues.

## 2018-01-22 ENCOUNTER — Ambulatory Visit (INDEPENDENT_AMBULATORY_CARE_PROVIDER_SITE_OTHER): Payer: PPO | Admitting: Family Medicine

## 2018-01-22 ENCOUNTER — Encounter: Payer: Self-pay | Admitting: Family Medicine

## 2018-01-22 VITALS — BP 122/70 | HR 88 | Temp 98.5°F | Resp 16 | Ht 69.0 in | Wt 182.0 lb

## 2018-01-22 DIAGNOSIS — I10 Essential (primary) hypertension: Secondary | ICD-10-CM

## 2018-01-22 DIAGNOSIS — E119 Type 2 diabetes mellitus without complications: Secondary | ICD-10-CM

## 2018-01-22 DIAGNOSIS — I252 Old myocardial infarction: Secondary | ICD-10-CM

## 2018-01-22 NOTE — Assessment & Plan Note (Signed)
Controlled, as of last A1c in 12/2017 - 6.4 Not due for repeat yet Remains on statin, ASA - may benefit from either ACEi/ARB in future, may discuss further with Cardiology at follow-up  We will contact [facility] for record of DM eye from 07/2017

## 2018-01-22 NOTE — Patient Instructions (Addendum)
Thank you for coming to the office today.  Keep up the good work overall.  No change to medicines today.  Recommend close follow-up with Cardiology and they will discuss the beta blocker Coreg if need dose adjusted, possibly higher dose due to somewhat elevated heart rate in 90s at times.  Listen to your body, stay active but do not overdo it. May need cardiac rehab  Keep apt and blood in September 2019  Return sooner if needed otherwise follow-up with Cardiology  Please schedule a Follow-up Appointment to: Return in about 5 months (around 06/24/2018) for keep already scheduled annual physical 06/2018.  If you have any other questions or concerns, please feel free to call the office or send a message through [place]. You may also schedule an earlier appointment if necessary.  Additionally, you may be receiving a survey about your experience at our office within a few days to 1 week by e-mail or mail. We value your feedback.  Nobie Putnam, DO [place]

## 2018-01-22 NOTE — Progress Notes (Signed)
Subjective:    Patient ID: Barry [name], male    DOB: 1946-03-01, 72 y.o.   MRN: [id number]  Barry [name] is a 72 y.o. male presenting on 01/22/2018 for Hospitalization Follow-up (STEMI)  History provided by patient and wife, Baldo Ash.  HPI  HOSPITAL FOLLOW-UP VISIT  Hospital/Location: [facility] Date of Admission: 01/16/18 Date of Discharge: 01/18/18 Transitions of care telephone call: Completed by Tyler Aas LPN on [birth year]53  Reason for Admission: Chest Pain Primary (+Secondary) Diagnosis: STEMI  - Hospital H&P and Discharge Summary have been reviewed - Patient presents today 4 days recent hospitalization. Brief summary of recent course, patient had symptoms of chest tightness and pressure on chest, also GERD like symptoms indigestion, he was advised via telephone on 01/16/18 to go to hospital ED, upon arrival determined he had STEMI (1st MI) and he was immediately taken to Cardiac Cath lab and followed by Cardiology, treated with PCI and stent to RCA, and placed on Brilinta and ASA. Ultimately discharged on med management with statin, continued BB, now on lower dose. Additionally he had Atrial fibrillation at first but it converted after stent.  - Today reports overall has done well after discharge. Symptoms of chest tightness and pressure have resolved. He has no chest pain or anginal symptoms. - He does feel still "tired and drained" but he has been gradually improving energy and more exercise tolerance, it has only been 4 days - He has been checking BP at home with improved readings 120-140s, recently mostly SBP 130s - He had labs with appropriate cholesterol on statin - His blood sugar mildly elevated during initial hospital stay, his last A1c 6.4, diabetes controlled w/ diet - he still has burping and GERD symptoms ,has not resumed Ranitidine 150mg  BID, not on PPI, he was told it would interfere with med.  He had episode of Diarrhea over weekend.  - New medications on discharge:  Brilinta, Atorvastatin (high intensity) - discontinued Simvastatin - Changes to current meds on discharge: Reduced Coreg 12.5mg  BID, down to 3.125mg  BID  Next apt 01/29/18 with Cardiology  Denies any fevers chills, abdominal pain, chest pain, dyspnea, headache, lightheadedness dizziness   I have reviewed the discharge medication list, and have reconciled the current and discharge medications today.   Current Outpatient Medications:  .  aspirin 81 MG tablet, Take 81 mg by mouth 2 (two) times daily., Disp: , Rfl:  .  atorvastatin (LIPITOR) 80 MG tablet, Take 1 tablet (80 mg total) by mouth daily at 6 PM., Disp: 30 tablet, Rfl: 0 .  carvedilol (COREG) 3.125 MG tablet, Take 1 tablet (3.125 mg total) by mouth 2 (two) times daily with a meal., Disp: 60 tablet, Rfl: 0 .  loratadine (CLARITIN) 10 MG tablet, Take 1 tablet (10 mg total) by mouth as needed., Disp: , Rfl:  .  Multiple Vitamin (MULTIVITAMIN WITH MINERALS) TABS tablet, Take 1 tablet by mouth every other day., Disp: , Rfl:  .  ranitidine (ZANTAC) 150 MG capsule, Take 150 mg by mouth daily as needed. , Disp: , Rfl:  .  tamsulosin (FLOMAX) 0.4 MG CAPS capsule, Take 1 capsule (0.4 mg total) by mouth daily., Disp: 30 capsule, Rfl: 5 .  ticagrelor (BRILINTA) 90 MG TABS tablet, Take 1 tablet (90 mg total) by mouth 2 (two) times daily., Disp: 60 tablet, Rfl: 0  ------------------------------------------------------------------------- Social History   Tobacco Use  . Smoking status: Never Smoker  . Smokeless tobacco: Former Systems developer    Types: Adult nurse  Topics  . Alcohol use: No  . Drug use: No    Review of Systems Per HPI unless specifically indicated above     Objective:    BP 122/70 (BP Location: Left Arm, Cuff Size: Normal)   Pulse 88   Temp 98.5 F (36.9 C) (Oral)   Resp 16   Ht 5\' 9"  (1.753 m)   Wt 182 lb (82.6 kg)   BMI 26.88 kg/m   Wt Readings from Last 3 Encounters:  01/22/18 182 lb (82.6 kg)  01/18/18 178 lb  14.4 oz (81.1 kg)  01/01/18 187 lb (84.8 kg)    Physical Exam  Constitutional: He is oriented to person, place, and time. He appears well-developed and well-nourished. No distress.  Well-appearing, comfortable, cooperative  HENT:  Head: Normocephalic and atraumatic.  Mouth/Throat: Oropharynx is clear and moist.  Eyes: Pupils are equal, round, and reactive to light. Conjunctivae and EOM are normal. Right eye exhibits no discharge. Left eye exhibits no discharge.  Neck: Normal range of motion. Neck supple. No thyromegaly present.  Cardiovascular: Normal rate, regular rhythm, normal heart sounds and intact distal pulses.  No murmur heard. Pulmonary/Chest: Effort normal and breath sounds normal. No respiratory distress. He has no wheezes. He has no rales.  Abdominal: Soft. Bowel sounds are normal. He exhibits no distension and no mass. There is no tenderness.  Musculoskeletal: Normal range of motion. He exhibits no edema or tenderness.  Lymphadenopathy:    He has no cervical adenopathy.  Neurological: He is alert and oriented to person, place, and time.  Skin: Skin is warm and dry. No rash noted. He is not diaphoretic. No erythema.  Ecchymosis bilateral forearms, R > L with various stages yellow to purple ecchymosis, non tender, over injection and cath side and he has small firm hematoma palpable bilateral over injection sites. Left forearm more mild but resolving ecchymosis fading.  Psychiatric: He has a normal mood and affect. His behavior is normal.  Well groomed, good eye contact, normal speech and thoughts  Nursing note and vitals reviewed.    Results for orders placed or performed during the hospital encounter of 01/16/18  MRSA PCR Screening  Result Value Ref Range   MRSA by PCR NEGATIVE NEGATIVE  CBC with Differential/Platelet  Result Value Ref Range   WBC 13.3 (H) 3.8 - 10.6 K/uL   RBC 4.83 4.40 - 5.90 MIL/uL   Hemoglobin 14.6 13.0 - 18.0 g/dL   HCT 43.4 40.0 - 52.0 %   MCV  89.9 80.0 - 100.0 fL   MCH 30.3 26.0 - 34.0 pg   MCHC 33.7 32.0 - 36.0 g/dL   RDW 13.0 11.5 - 14.5 %   Platelets 271 150 - 440 K/uL   Neutrophils Relative % 74 %   Neutro Abs 9.8 (H) 1.4 - 6.5 K/uL   Lymphocytes Relative 18 %   Lymphs Abs 2.4 1.0 - 3.6 K/uL   Monocytes Relative 7 %   Monocytes Absolute 0.9 0.2 - 1.0 K/uL   Eosinophils Relative 1 %   Eosinophils Absolute 0.1 0 - 0.7 K/uL   Basophils Relative 0 %   Basophils Absolute 0.0 0 - 0.1 K/uL  Protime-INR  Result Value Ref Range   Prothrombin Time 13.5 11.4 - 15.2 seconds   INR 1.04   APTT  Result Value Ref Range   aPTT 24 24 - 36 seconds  Comprehensive metabolic panel  Result Value Ref Range   Sodium 137 135 - 145 mmol/L   Potassium 4.1  3.5 - 5.1 mmol/L   Chloride 103 101 - 111 mmol/L   CO2 23 22 - 32 mmol/L   Glucose, Bld 320 (H) 65 - 99 mg/dL   BUN 19 6 - 20 mg/dL   Creatinine, Ser 1.20 0.61 - 1.24 mg/dL   Calcium 9.2 8.9 - 10.3 mg/dL   Total Protein 6.9 6.5 - 8.1 g/dL   Albumin 4.3 3.5 - 5.0 g/dL   AST 60 (H) 15 - 41 U/L   ALT 29 17 - 63 U/L   Alkaline Phosphatase 83 38 - 126 U/L   Total Bilirubin 0.8 0.3 - 1.2 mg/dL   GFR calc non Af Amer 59 (L) >60 mL/min   GFR calc Af Amer >60 >60 mL/min   Anion gap 11 5 - 15  Troponin I  Result Value Ref Range   Troponin I 1.78 (HH) <0.03 ng/mL  Lipid panel  Result Value Ref Range   Cholesterol 142 0 - 200 mg/dL   Triglycerides 129 <150 mg/dL   HDL 39 (L) >40 mg/dL   Total CHOL/HDL Ratio 3.6 RATIO   VLDL 26 0 - 40 mg/dL   LDL Cholesterol 77 0 - 99 mg/dL  Glucose, capillary  Result Value Ref Range   Glucose-Capillary 233 (H) 65 - 99 mg/dL  Troponin I (serum)  Result Value Ref Range   Troponin I 48.06 (HH) <0.03 ng/mL  Troponin I (serum)  Result Value Ref Range   Troponin I 54.65 (HH) <0.03 ng/mL  Basic metabolic panel  Result Value Ref Range   Sodium 138 135 - 145 mmol/L   Potassium 3.4 (L) 3.5 - 5.1 mmol/L   Chloride 107 101 - 111 mmol/L   CO2 23 22 - 32  mmol/L   Glucose, Bld 135 (H) 65 - 99 mg/dL   BUN 20 6 - 20 mg/dL   Creatinine, Ser 1.02 0.61 - 1.24 mg/dL   Calcium 8.5 (L) 8.9 - 10.3 mg/dL   GFR calc non Af Amer >60 >60 mL/min   GFR calc Af Amer >60 >60 mL/min   Anion gap 8 5 - 15  Glucose, capillary  Result Value Ref Range   Glucose-Capillary 186 (H) 65 - 99 mg/dL  Magnesium  Result Value Ref Range   Magnesium 2.0 1.7 - 2.4 mg/dL  Phosphorus  Result Value Ref Range   Phosphorus 3.2 2.5 - 4.6 mg/dL  Calcium, ionized  Result Value Ref Range   Calcium, Ionized, Serum 5.0 4.5 - 5.6 mg/dL  CBC with Differential/Platelet  Result Value Ref Range   WBC 13.3 (H) 3.8 - 10.6 K/uL   RBC 4.15 (L) 4.40 - 5.90 MIL/uL   Hemoglobin 13.0 13.0 - 18.0 g/dL   HCT 36.9 (L) 40.0 - 52.0 %   MCV 88.8 80.0 - 100.0 fL   MCH 31.2 26.0 - 34.0 pg   MCHC 35.2 32.0 - 36.0 g/dL   RDW 12.7 11.5 - 14.5 %   Platelets 206 150 - 440 K/uL   Neutrophils Relative % 76 %   Neutro Abs 10.0 (H) 1.4 - 6.5 K/uL   Lymphocytes Relative 12 %   Lymphs Abs 1.6 1.0 - 3.6 K/uL   Monocytes Relative 12 %   Monocytes Absolute 1.6 (H) 0.2 - 1.0 K/uL   Eosinophils Relative 0 %   Eosinophils Absolute 0.0 0 - 0.7 K/uL   Basophils Relative 0 %   Basophils Absolute 0.0 0 - 0.1 K/uL  Troponin I (serum)  Result Value Ref Range   Troponin  I 30.57 (HH) <0.03 ng/mL  Glucose, capillary  Result Value Ref Range   Glucose-Capillary 182 (H) 65 - 99 mg/dL  Glucose, capillary  Result Value Ref Range   Glucose-Capillary 153 (H) 65 - 99 mg/dL  Glucose, capillary  Result Value Ref Range   Glucose-Capillary 125 (H) 65 - 99 mg/dL  Glucose, capillary  Result Value Ref Range   Glucose-Capillary 139 (H) 65 - 99 mg/dL  Basic metabolic panel  Result Value Ref Range   Sodium 141 135 - 145 mmol/L   Potassium 3.6 3.5 - 5.1 mmol/L   Chloride 110 101 - 111 mmol/L   CO2 24 22 - 32 mmol/L   Glucose, Bld 136 (H) 65 - 99 mg/dL   BUN 18 6 - 20 mg/dL   Creatinine, Ser 0.91 0.61 - 1.24 mg/dL     Calcium 8.4 (L) 8.9 - 10.3 mg/dL   GFR calc non Af Amer >60 >60 mL/min   GFR calc Af Amer >60 >60 mL/min   Anion gap 7 5 - 15  Glucose, capillary  Result Value Ref Range   Glucose-Capillary 167 (H) 65 - 99 mg/dL  Glucose, capillary  Result Value Ref Range   Glucose-Capillary 130 (H) 65 - 99 mg/dL  I-STAT, chem 8  Result Value Ref Range   Sodium 136 135 - 145 mmol/L   Potassium 3.9 3.5 - 5.1 mmol/L   Chloride 101 101 - 111 mmol/L   BUN 17 6 - 20 mg/dL   Creatinine, Ser 0.90 0.61 - 1.24 mg/dL   Glucose, Bld 326 (H) 65 - 99 mg/dL   Calcium, Ion 1.15 1.15 - 1.40 mmol/L   TCO2 19 (L) 22 - 32 mmol/L   Hemoglobin 12.2 (L) 13.0 - 17.0 g/dL   HCT 36.0 (L) 39.0 - 52.0 %  POCT Activated clotting time  Result Value Ref Range   Activated Clotting Time 241 seconds  ECHOCARDIOGRAM COMPLETE  Result Value Ref Range   Weight 2,977.09 oz   Height 69 in   BP 114/75 mmHg      Assessment & Plan:   Problem List Items Addressed This Visit    Controlled type 2 diabetes mellitus without complication ([place])    Controlled, as of last A1c in 12/2017 - 6.4 Not due for repeat yet Remains on statin, ASA - may benefit from either ACEi/ARB in future, may discuss further with Cardiology at follow-up  We will contact [facility] [facility] for record of DM eye from 07/2017        Essential (primary) hypertension    Controlled BP No known complications  Plan: 1. Continue Carvedilol 3.125mg  BID, now has indication with s/p MI - may discuss dosing with Cardiology - May discuss [facility] with Cardiology at future follow-up - Encouraged low sodium diet, regular exercise      History of ST elevation myocardial infarction (STEMI) - Primary    S/p STEMI on 01/16/18, RCA w/ stent placement, on DAPT and med management now Followed by Cardiology 01/16/18 - ECHO in hospital post MI showed mild reduced LVEF 45-50% Continue current med management and follow-up plan, anticipate may benefit from cardiac rehab in near  future         #Ecchymosis, forearms Suspected secondary to cardiac cath and DAPT, has some area at cath site with possible mild hematoma formation that seems to be resolving.  No orders of the defined types were placed in this encounter.   Follow up plan: Return in about 5 months (around 06/24/2018) for keep  already scheduled annual physical 06/2018.  Nobie Putnam, [facility] 01/22/2018, 10:19 PM

## 2018-01-22 NOTE — Assessment & Plan Note (Signed)
S/p STEMI on 01/16/18, RCA w/ stent placement, on DAPT and med management now Followed by Cardiology 01/16/18 - ECHO in hospital post MI showed mild reduced LVEF 45-50% Continue current med management and follow-up plan, anticipate may benefit from cardiac rehab in near future

## 2018-01-22 NOTE — Assessment & Plan Note (Signed)
Controlled BP No known complications  Plan: 1. Continue Carvedilol 3.125mg  BID, now has indication with s/p MI - may discuss dosing with Cardiology - May discuss [facility] with Cardiology at future follow-up - Encouraged low sodium diet, regular exercise

## 2018-01-22 NOTE — Telephone Encounter (Signed)
Patient contacted regarding discharge from [facility] on 01/18/18.  Patient understands to follow up with provider Dr. Rockey Situ on 01/29/18 at 08:40AM at [facility]. Patient understands discharge instructions? Yes Patient understands medications and regiment? Yes Patient understands to bring all medications to this visit? Yes  Patient verbalized understanding of appointment information with no further questions at this time.

## 2018-01-27 DIAGNOSIS — I480 Paroxysmal atrial fibrillation: Secondary | ICD-10-CM | POA: Insufficient documentation

## 2018-01-27 DIAGNOSIS — I255 Ischemic cardiomyopathy: Secondary | ICD-10-CM | POA: Insufficient documentation

## 2018-01-27 NOTE — Progress Notes (Signed)
Cardiology Office Note  Date:  01/29/2018   ID:  Carmon, Sahli 01/23/1946, MRN [id number]  PCP:  Olin Hauser, DO   Chief Complaint  Patient presents with  . Other    Follow up from Stent placement. Patient c/o a little SOB due to him being weak. Meds reviewed verbally with patient.     HPI:  72 year old gentleman with history of  hypertension,  hyperlipidemia,  arthritis,  Chronic right knee pain,  Diabetes type 2 who presents for follow-up of his coronary disease, recent STEMI  January 16, 2018 chest pain, d/c on 01/18/2018 Hospital records reviewed with the patient in detail Cardiac catheterization performed inferior STEMI thrombotic occlusion to the proximal RCA Also with moderate nonobstructive coronary disease of LAD and left circumflex Proximal to mid LAD is aneurysmal Drug-eluting stent placed to proximal to mid RCA 3.0 x 38 mm  Echocardiogram - Left ventricle: The cavity size was normal. Wall thickness was   increased in a pattern of mild LVH. Systolic function was mildly   reduced. The estimated ejection fraction was in the range of 45%   to 50%. There is hypokinesis of the basal-midinferolateral and   inferior myocardium. Left ventricular diastolic function   parameters were normal for the patient&'s age. - Aortic valve: There was trivial regurgitation. - Right ventricle: The cavity size was normal. Systolic function   was mildly to moderately reduced.  Peak troponin 54 on January 17, 2018  Cardiac rehab order had been placed Paroxysmal atrial fibrillation noted prior to PCI Event monitor was recommended as outpatient   He continues to drink sweet tea and Pepsi  Review of hemoglobin A1c over the past several years range from 5.9 up to 6.8 No regular exercise program  Previous side effects to various medications Amlodipine, he developed swelling HCTZ, reported having dry skin, dry mouth Lisinopril, developed a cough Atenolol: did not  work Coreg: ok so far Previous dose was 12.5 twice daily This was decreased down to 3.125 when he left the hospital  Blood pressure at home has been typically running 130s heart rate 70-80 In the office today heart rate elevated  EKG personally reviewed by myself on todays visit Shows sinus tachycardia rate 103 bpm T wave abnormality V4 through V6 inferior leads  Family history of diabetes, MI Mom with CVA Dad with alzheimers  PMH:   has a past medical history of Arthritis, BPH (benign prostatic hypertrophy), GERD (gastroesophageal reflux disease), Hyperlipidemia, and Hypertension.  PSH:    Past Surgical History:  Procedure Laterality Date  . CATARACT EXTRACTION W/PHACO Right 04/12/2016   Procedure: CATARACT EXTRACTION PHACO AND INTRAOCULAR LENS PLACEMENT (IOC);  Surgeon: Birder Robson, MD;  Location: [facility];  Service: Ophthalmology;  Laterality: Right;  [place]' 01:05 AP% 26.6 CDE 17.48 fluid pack lot # [id number]  . CORONARY/GRAFT ACUTE MI REVASCULARIZATION N/A 01/16/2018   Procedure: Coronary/Graft Acute MI Revascularization;  Surgeon: Nelva Bush, MD;  Location: [place] [facility];  Service: Cardiovascular;  Laterality: N/A;  . KNEE SURGERY Right 09/21/2012  . LEFT HEART CATH AND CORONARY ANGIOGRAPHY N/A 01/16/2018   Procedure: LEFT HEART CATH AND CORONARY ANGIOGRAPHY;  Surgeon: Nelva Bush, MD;  Location: [place] [facility];  Service: Cardiovascular;  Laterality: N/A;    Current Outpatient Medications  Medication Sig Dispense Refill  . aspirin 81 MG tablet Take 81 mg by mouth 2 (two) times daily.    Marland Kitchen atorvastatin (LIPITOR) 80 MG tablet Take 1 tablet (80 mg total) by mouth  daily at 6 PM. 30 tablet 0  . loratadine (CLARITIN) 10 MG tablet Take 1 tablet (10 mg total) by mouth as needed.    . Multiple Vitamin (MULTIVITAMIN WITH MINERALS) TABS tablet Take 1 tablet by mouth every other day.    . ranitidine (ZANTAC) 150 MG capsule Take 150 mg by mouth daily as needed.      . tamsulosin (FLOMAX) 0.4 MG CAPS capsule Take 1 capsule (0.4 mg total) by mouth daily. 30 capsule 5  . ticagrelor (BRILINTA) 90 MG TABS tablet Take 1 tablet (90 mg total) by mouth 2 (two) times daily. 60 tablet 0  . carvedilol (COREG) 6.25 MG tablet Take 1 tablet (6.25 mg total) by mouth 2 (two) times daily. 180 tablet 3  . losartan (COZAAR) 25 MG tablet Take 1 tablet (25 mg total) by mouth daily. 90 tablet 3   No current facility-administered medications for this visit.      Allergies:   Lisinopril   Social History:  The patient  reports that he has never smoked. He has quit using smokeless tobacco. His smokeless tobacco use included chew. He reports that he does not drink alcohol or use drugs.   Family History:   family history includes Diabetes in his mother; Hyperlipidemia in his paternal aunt; Melanoma in his brother; Prostate cancer in his brother; Stroke in his mother.    Review of Systems: Review of Systems  Constitutional: Negative.   Respiratory: Negative.   Cardiovascular: Negative.   Gastrointestinal: Negative.   Musculoskeletal: Positive for joint pain.  Neurological: Negative.   Psychiatric/Behavioral: Negative.   All other systems reviewed and are negative.    PHYSICAL EXAM: VS:  BP 130/68 (BP Location: Left Arm, Patient Position: Sitting, Cuff Size: Normal)   Pulse (!) 103   Ht 5\' 9"  (1.753 m)   Wt 182 lb 4 oz (82.7 kg)   BMI 26.91 kg/m  , BMI Body mass index is 26.91 kg/m. Constitutional:  oriented to person, place, and time. No distress.  HENT:  Head: Normocephalic and atraumatic.  Eyes:  no discharge. No scleral icterus.  Neck: Normal range of motion. Neck supple. No JVD present. , 1+ carotid bruit left Cardiovascular: Normal rate, regular rhythm, normal heart sounds and intact distal pulses. Exam reveals no gallop and no friction rub. No edema No murmur heard. Pulmonary/Chest: Effort normal and breath sounds normal. No stridor. No respiratory  distress.  no wheezes.  no rales.  no tenderness.  Abdominal: Soft.  no distension.  no tenderness.  Musculoskeletal: Normal range of motion.  no  tenderness or deformity.  Neurological:  normal muscle tone. Coordination normal. No atrophy Skin: Skin is warm and dry. No rash noted. not diaphoretic.  Psychiatric:  normal mood and affect. behavior is normal. Thought content normal.    Recent Labs: 01/16/2018: ALT 29 01/17/2018: Hemoglobin 13.0; Magnesium 2.0; Platelets 206 01/18/2018: BUN 18; Creatinine, Ser 0.91; Potassium 3.6; Sodium 141    Lipid Panel Lab Results  Component Value Date   CHOL 142 01/16/2018   HDL 39 (L) 01/16/2018   LDLCALC 77 01/16/2018   TRIG 129 01/16/2018      Wt Readings from Last 3 Encounters:  01/29/18 182 lb 4 oz (82.7 kg)  01/22/18 182 lb (82.6 kg)  01/18/18 178 lb 14.4 oz (81.1 kg)       ASSESSMENT AND PLAN:  Essential (primary) hypertension - Heart rate high blood pressure borderline high Recommended he start losartan 25 mg daily and increase carvedilol up  to 6.25 mg twice daily Reports having a cough in the past on lisinopril  Pure hypercholesterolemia -  Previously on simvastatin 20 with reasonable cholesterol numbers now on Lipitor 80 Goal LDL less than 70  repeat lipids in 3 months time  Diabetes mellitus due to underlying condition with hyperosmolarity without coma, without long-term current use of insulin ([place]) Long discussion concerning need to change his diet, stop drinking Pepsi and sweet tea He does eat lots of bread Dietary guide provided  CAD/STEMI presented April 2019 with STEMI occluded proximal RCA Also with 50% LAD disease, circumflex disease Long discussion with him concerning recent events in the hospital, EKG findings, echocardiogram findings, lab work done in the hospital Discussed rehab, Various medication changes made today   Total encounter time more than 60 minutes  Greater than 50% was spent in counseling and  coordination of care with the patient  Disposition:   F/U  3 months   Orders Placed This Encounter  Procedures  . AMB referral to cardiac rehabilitation  . LONG TERM MONITOR (3-14 DAYS)  . EKG 12-Lead     Signed, Esmond Plants, M.D., Ph.D. 01/29/2018  [place], [place]

## 2018-01-29 ENCOUNTER — Encounter: Payer: Self-pay | Admitting: Cardiovascular Disease

## 2018-01-29 ENCOUNTER — Ambulatory Visit (INDEPENDENT_AMBULATORY_CARE_PROVIDER_SITE_OTHER): Payer: PPO | Admitting: Cardiovascular Disease

## 2018-01-29 ENCOUNTER — Ambulatory Visit (INDEPENDENT_AMBULATORY_CARE_PROVIDER_SITE_OTHER): Payer: PPO

## 2018-01-29 VITALS — BP 130/68 | HR 103 | Ht 69.0 in | Wt 182.2 lb

## 2018-01-29 DIAGNOSIS — I25118 Atherosclerotic heart disease of native coronary artery with other forms of angina pectoris: Secondary | ICD-10-CM

## 2018-01-29 DIAGNOSIS — E7800 Pure hypercholesterolemia, unspecified: Secondary | ICD-10-CM | POA: Diagnosis not present

## 2018-01-29 DIAGNOSIS — I255 Ischemic cardiomyopathy: Secondary | ICD-10-CM | POA: Diagnosis not present

## 2018-01-29 DIAGNOSIS — I480 Paroxysmal atrial fibrillation: Secondary | ICD-10-CM | POA: Diagnosis not present

## 2018-01-29 DIAGNOSIS — I10 Essential (primary) hypertension: Secondary | ICD-10-CM

## 2018-01-29 DIAGNOSIS — E119 Type 2 diabetes mellitus without complications: Secondary | ICD-10-CM | POA: Diagnosis not present

## 2018-01-29 DIAGNOSIS — R0989 Other specified symptoms and signs involving the circulatory and respiratory systems: Secondary | ICD-10-CM

## 2018-01-29 DIAGNOSIS — I252 Old myocardial infarction: Secondary | ICD-10-CM | POA: Diagnosis not present

## 2018-01-29 MED ORDER — LOSARTAN POTASSIUM 25 MG PO TABS: 25.0000 mg | ORAL_TABLET | Freq: Every day | ORAL | 3 refills | Status: DC

## 2018-01-29 MED ORDER — ATORVASTATIN CALCIUM 80 MG PO TABS: 80.0000 mg | ORAL_TABLET | Freq: Every day | ORAL | 3 refills | Status: DC

## 2018-01-29 MED ORDER — CARVEDILOL 6.25 MG PO TABS: 6.2500 mg | ORAL_TABLET | Freq: Two times a day (BID) | ORAL | 3 refills | Status: DC

## 2018-01-29 MED ORDER — TICAGRELOR 90 MG PO TABS: 90.0000 mg | ORAL_TABLET | Freq: Two times a day (BID) | ORAL | 3 refills | Status: DC

## 2018-01-29 NOTE — Patient Instructions (Addendum)
Cardiac rehab for post-STEMI- you should receive a call directly from Cardiac Rehab for start dates- please call the office back if you have not heard from Cardiac Rehab by the middle of next week [phone].   Medication Instructions:   1) Please start losartan 25 mg daily  2) Please increase the coreg up to 6.25 mg twice a day  Labwork:  No new labs needed  Testing/Procedures:  - Your physician has recommended that you wear a heart monitor (ZIO) for 14 days.  Carotid u/s for bruit Your physician has requested that you have a carotid duplex. This test is an ultrasound of the carotid arteries in your neck. It looks at blood flow through these arteries that supply the brain with blood. Allow one hour for this exam. There are no restrictions or special instructions.  Follow-Up: It was a pleasure seeing you in the office today. Please call us if you have new issues that need to be addressed before your next appt.  [phone]  Your physician wants you to follow-up in: 3 months.  You will receive a reminder letter in the mail two months in advance. If you don't receive a letter, please call our office to schedule the follow-up appointment.  If you need a refill on your cardiac medications before your next appointment, please call your pharmacy.  For educational health videos Log in to : [email or web address] Or : [email or web address], password : [email or web address]

## 2018-02-05 ENCOUNTER — Encounter: Payer: PPO | Attending: Internal Medicine | Admitting: *Deleted

## 2018-02-05 VITALS — Ht 68.75 in | Wt 180.0 lb

## 2018-02-05 DIAGNOSIS — Z955 Presence of coronary angioplasty implant and graft: Secondary | ICD-10-CM | POA: Diagnosis not present

## 2018-02-05 DIAGNOSIS — Z48812 Encounter for surgical aftercare following surgery on the circulatory system: Secondary | ICD-10-CM | POA: Diagnosis not present

## 2018-02-05 DIAGNOSIS — I213 ST elevation (STEMI) myocardial infarction of unspecified site: Secondary | ICD-10-CM | POA: Insufficient documentation

## 2018-02-05 NOTE — Progress Notes (Signed)
Cardiac Individual Treatment Plan  Patient Details  Name: Barry [name] MRN: [id number] Date of Birth: 1946/05/10 Referring Provider:     Cardiac Rehab from 02/05/2018 in [facility] Cardiac and Pulmonary Rehab  Referring Provider  End, Harrell Gave MD      Initial Encounter Date:    Cardiac Rehab from 02/05/2018 in [facility] Cardiac and Pulmonary Rehab  Date  02/05/18  Referring Provider  End, Harrell Gave MD      Visit Diagnosis: ST elevation myocardial infarction (STEMI), unspecified artery [facility])  Status post coronary artery stent placement  Patient's Home Medications on Admission:  Current Outpatient Medications:  .  aspirin 81 MG tablet, Take 81 mg by mouth 2 (two) times daily., Disp: , Rfl:  .  atorvastatin (LIPITOR) 80 MG tablet, Take 1 tablet (80 mg total) by mouth daily at 6 PM., Disp: 90 tablet, Rfl: 3 .  carvedilol (COREG) 6.25 MG tablet, Take 1 tablet (6.25 mg total) by mouth 2 (two) times daily., Disp: 180 tablet, Rfl: 3 .  loratadine (CLARITIN) 10 MG tablet, Take 1 tablet (10 mg total) by mouth as needed., Disp: , Rfl:  .  losartan (COZAAR) 25 MG tablet, Take 1 tablet (25 mg total) by mouth daily., Disp: 90 tablet, Rfl: 3 .  Multiple Vitamin (MULTIVITAMIN WITH MINERALS) TABS tablet, Take 1 tablet by mouth every other day., Disp: , Rfl:  .  ranitidine (ZANTAC) 150 MG capsule, Take 150 mg by mouth daily as needed. , Disp: , Rfl:  .  tamsulosin (FLOMAX) 0.4 MG CAPS capsule, Take 1 capsule (0.4 mg total) by mouth daily., Disp: 30 capsule, Rfl: 5 .  ticagrelor (BRILINTA) 90 MG TABS tablet, Take 1 tablet (90 mg total) by mouth 2 (two) times daily., Disp: 180 tablet, Rfl: 3  Past Medical History: Past Medical History:  Diagnosis Date  . Arthritis   . BPH (benign prostatic hypertrophy)   . GERD (gastroesophageal reflux disease)   . Hyperlipidemia   . Hypertension     Tobacco Use: Social History   Tobacco Use  Smoking Status Never Smoker  Smokeless Tobacco Former Systems developer  .  Types: Chew    Labs: Recent Review Flowsheet Data    Labs for ITP Cardiac and Pulmonary Rehab Latest Ref Rng & Units 06/15/2016 11/14/2016 06/27/2017 12/25/2017 01/16/2018   Cholestrol 0 - 200 mg/dL 157 - 126 - 142   LDLCALC 0 - 99 mg/dL 90 - 69 - 77   HDL >40 mg/dL 44 - 40(L) - 39(L)   Trlycerides <150 mg/dL 116 - 89 - 129   Hemoglobin A1c <5.7 % of total Hgb 5.9(H) 6.6% 6.0(H) 6.4(H) -   TCO2 22 - 32 mmol/L - - - - 19(L)       Exercise Target Goals: Date: 02/05/18  Exercise Program Goal: Individual exercise prescription set using results from initial 6 min walk test and THRR while considering  patient's activity barriers and safety.   Exercise Prescription Goal: Initial exercise prescription builds to 30-45 minutes a day of aerobic activity, 2-3 days per week.  Home exercise guidelines will be given to patient during program as part of exercise prescription that the participant will acknowledge.  Activity Barriers & Risk Stratification: Activity Barriers & Cardiac Risk Stratification - 02/05/18 1238      Activity Barriers & Cardiac Risk Stratification   Activity Barriers  Joint Problems;Shortness of Breath;Muscular Weakness;Deconditioning;Balance Concerns R knee has had arthoscopy, chronic knee pain    Cardiac Risk Stratification  Moderate  6 Minute Walk: 6 Minute Walk    Row Name 02/05/18 1433         6 Minute Walk   Phase  Initial     Distance  1035 feet     Walk Time  6 minutes     # of Rest Breaks  0     MPH  1.96     METS  2.21     RPE  9     VO2 Peak  7.75     Symptoms  No     Resting HR  66 bpm     Resting BP  128/70     Resting Oxygen Saturation   98 %     Exercise Oxygen Saturation  during 6 min walk  98 %     Max Ex. HR  82 bpm     Max Ex. BP  120/74     2 Minute Post BP  124/70        Oxygen Initial Assessment:   Oxygen Re-Evaluation:   Oxygen Discharge (Final Oxygen Re-Evaluation):   Initial Exercise Prescription: Initial Exercise  Prescription - 02/05/18 1400      Date of Initial Exercise RX and Referring Provider   Date  02/05/18    Referring Provider  End, Harrell Gave MD      Treadmill   MPH  1.9    Grade  0.5    Minutes  15    METs  2.59      Recumbant Bike   Level  1    RPM  50    Watts  10    Minutes  15    METs  2.5      NuStep   Level  1    SPM  80    Minutes  15    METs  2.5      Prescription Details   Frequency (times per week)  2    Duration  Progress to 30 minutes of continuous aerobic without signs/symptoms of physical distress      Intensity   THRR 40-80% of Max Heartrate  99-132    Ratings of Perceived Exertion  11-13    Perceived Dyspnea  0-4      Progression   Progression  Continue to progress workloads to maintain intensity without signs/symptoms of physical distress.      Resistance Training   Training Prescription  Yes    Weight  3 lbs    Reps  10-15       Perform Capillary Blood Glucose checks as needed.  Exercise Prescription Changes: Exercise Prescription Changes    Row Name 02/05/18 1200             Response to Exercise   Blood Pressure (Admit)  128/70       Blood Pressure (Exercise)  120/74       Blood Pressure (Exit)  124/70       Heart Rate (Admit)  66 bpm       Heart Rate (Exercise)  82 bpm       Heart Rate (Exit)  76 bpm       Oxygen Saturation (Admit)  98 %       Oxygen Saturation (Exercise)  98 %       Rating of Perceived Exertion (Exercise)  9       Symptoms  none       Comments  walk test results  Exercise Comments:   Exercise Goals and Review: Exercise Goals    Row Name 02/05/18 1439             Exercise Goals   Increase Physical Activity  Yes       Intervention  Provide advice, education, support and counseling about physical activity/exercise needs.;Develop an individualized exercise prescription for aerobic and resistive training based on initial evaluation findings, risk stratification, comorbidities and participant's  personal goals.       Expected Outcomes  Short Term: Attend rehab on a regular basis to increase amount of physical activity.;Long Term: Add in home exercise to make exercise part of routine and to increase amount of physical activity.;Long Term: Exercising regularly at least 3-5 days a week.       Increase Strength and Stamina  Yes       Intervention  Provide advice, education, support and counseling about physical activity/exercise needs.;Develop an individualized exercise prescription for aerobic and resistive training based on initial evaluation findings, risk stratification, comorbidities and participant's personal goals.       Expected Outcomes  Short Term: Increase workloads from initial exercise prescription for resistance, speed, and METs.;Short Term: Perform resistance training exercises routinely during rehab and add in resistance training at home;Long Term: Improve cardiorespiratory fitness, muscular endurance and strength as measured by increased METs and functional capacity (6MWT)       Able to understand and use rate of perceived exertion (RPE) scale  Yes       Intervention  Provide education and explanation on how to use RPE scale       Expected Outcomes  Short Term: Able to use RPE daily in rehab to express subjective intensity level;Long Term:  Able to use RPE to guide intensity level when exercising independently       Knowledge and understanding of Target Heart Rate Range (THRR)  Yes       Intervention  Provide education and explanation of THRR including how the numbers were predicted and where they are located for reference       Expected Outcomes  Short Term: Able to state/look up THRR;Long Term: Able to use THRR to govern intensity when exercising independently;Short Term: Able to use daily as guideline for intensity in rehab       Able to check pulse independently  Yes       Intervention  Provide education and demonstration on how to check pulse in carotid and radial  arteries.;Review the importance of being able to check your own pulse for safety during independent exercise       Expected Outcomes  Short Term: Able to explain why pulse checking is important during independent exercise;Long Term: Able to check pulse independently and accurately       Understanding of Exercise Prescription  Yes       Intervention  Provide education, explanation, and written materials on patient's individual exercise prescription       Expected Outcomes  Short Term: Able to explain program exercise prescription;Long Term: Able to explain home exercise prescription to exercise independently          Exercise Goals Re-Evaluation :   Discharge Exercise Prescription (Final Exercise Prescription Changes): Exercise Prescription Changes - 02/05/18 1200      Response to Exercise   Blood Pressure (Admit)  128/70    Blood Pressure (Exercise)  120/74    Blood Pressure (Exit)  124/70    Heart Rate (Admit)  66 bpm    Heart Rate (  Exercise)  82 bpm    Heart Rate (Exit)  76 bpm    Oxygen Saturation (Admit)  98 %    Oxygen Saturation (Exercise)  98 %    Rating of Perceived Exertion (Exercise)  9    Symptoms  none    Comments  walk test results       Nutrition:  Target Goals: Understanding of nutrition guidelines, daily intake of sodium <1538m, cholesterol <2019m calories 30% from fat and 7% or less from saturated fats, daily to have 5 or more servings of fruits and vegetables.  Biometrics: Pre Biometrics - 02/05/18 1440      Pre Biometrics   Height  5' 8.75" (1.746 m)    Weight  180 lb (81.6 kg)    Waist Circumference  37 inches    Hip Circumference  38.5 inches    Waist to Hip Ratio  0.96 %    BMI (Calculated)  26.78    Single Leg Stand  2.79 seconds        Nutrition Therapy Plan and Nutrition Goals: Nutrition Therapy & Goals - 02/05/18 1240      Intervention Plan   Intervention  Prescribe, educate and counsel regarding individualized specific dietary  modifications aiming towards targeted core components such as weight, hypertension, lipid management, diabetes, heart failure and other comorbidities.    Expected Outcomes  Short Term Goal: Understand basic principles of dietary content, such as calories, fat, sodium, cholesterol and nutrients.;Short Term Goal: A plan has been developed with personal nutrition goals set during dietitian appointment.;Long Term Goal: Adherence to prescribed nutrition plan.       Nutrition Assessments: Nutrition Assessments - 02/05/18 1240      MEDFICTS Scores   Pre Score  34       Nutrition Goals Re-Evaluation:   Nutrition Goals Discharge (Final Nutrition Goals Re-Evaluation):   Psychosocial: Target Goals: Acknowledge presence or absence of significant depression and/or stress, maximize coping skills, provide positive support system. Participant is able to verbalize types and ability to use techniques and skills needed for reducing stress and depression.   Initial Review & Psychosocial Screening: Initial Psych Review & Screening - 02/05/18 1251      Initial Review   Current issues with  None Identified      Family Dynamics   Good Support System?  Yes Spouse, family, friends, pastor      Barriers   Psychosocial barriers to participate in program  There are no identifiable barriers or psychosocial needs.;The patient should benefit from training in stress management and relaxation.      Screening Interventions   Interventions  Encouraged to exercise;Provide feedback about the scores to participant;To provide support and resources with identified psychosocial needs    Expected Outcomes  Short Term goal: Utilizing psychosocial counselor, staff and physician to assist with identification of specific Stressors or current issues interfering with healing process. Setting desired goal for each stressor or current issue identified.;Long Term Goal: Stressors or current issues are controlled or eliminated.;Short  Term goal: Identification and review with participant of any Quality of Life or Depression concerns found by scoring the questionnaire.;Long Term goal: The participant improves quality of Life and PHQ9 Scores as seen by post scores and/or verbalization of changes       Quality of Life Scores:  Quality of Life - 02/05/18 1252      Quality of Life Scores   Health/Function Pre  26.18 %    Socioeconomic Pre  30 %  Psych/Spiritual Pre  30 %    Family Pre  30 %    GLOBAL Pre  28.5 %      Scores of 19 and below usually indicate a poorer quality of life in these areas.  A difference of  2-3 points is a clinically meaningful difference.  A difference of 2-3 points in the total score of the Quality of Life Index has been associated with significant improvement in overall quality of life, self-image, physical symptoms, and general health in studies assessing change in quality of life.  PHQ-9: Recent Review Flowsheet Data    Depression screen [facility] 2/9 02/05/2018 01/01/2018 06/27/2017 08/09/2016 06/06/2016   Decreased Interest 0 0 0 1 0   Down, Depressed, Hopeless 0 0 0 0 0   PHQ - 2 Score 0 0 0 1 0   Altered sleeping 0 - - - -   Tired, decreased energy 0 - - - -   Change in appetite 0 - - - -   Feeling bad or failure about yourself  0 - - - -   Trouble concentrating 0 - - - -   Moving slowly or fidgety/restless 0 - - - -   Suicidal thoughts 0 - - - -   PHQ-9 Score 0 - - - -   Difficult doing work/chores Not difficult at all - - - -     Interpretation of Total Score  Total Score Depression Severity:  1-4 = Minimal depression, 5-9 = Mild depression, 10-14 = Moderate depression, 15-19 = Moderately severe depression, 20-27 = Severe depression   Psychosocial Evaluation and Intervention:   Psychosocial Re-Evaluation:   Psychosocial Discharge (Final Psychosocial Re-Evaluation):   Vocational Rehabilitation: Provide vocational rehab assistance to qualifying candidates.   Vocational Rehab  Evaluation & Intervention: Vocational Rehab - 02/05/18 1254      Initial Vocational Rehab Evaluation & Intervention   Assessment shows need for Vocational Rehabilitation  No       Education: Education Goals: Education classes will be provided on a variety of topics geared toward better understanding of heart health and risk factor modification. Participant will state understanding/return demonstration of topics presented as noted by education test scores.  Learning Barriers/Preferences: Learning Barriers/Preferences - 02/05/18 1253      Learning Barriers/Preferences   Learning Barriers  Exercise Concerns    Learning Preferences  Verbal Instruction;Written Material       Education Topics:  AED/CPR: - Group verbal and written instruction with the use of models to demonstrate the basic use of the AED with the basic ABC's of resuscitation.   General Nutrition Guidelines/Fats and Fiber: -Group instruction provided by verbal, written material, models and posters to present the general guidelines for heart healthy nutrition. Gives an explanation and review of dietary fats and fiber.   Controlling Sodium/Reading Food Labels: -Group verbal and written material supporting the discussion of sodium use in heart healthy nutrition. Review and explanation with models, verbal and written materials for utilization of the food label.   Exercise Physiology & General Exercise Guidelines: - Group verbal and written instruction with models to review the exercise physiology of the cardiovascular system and associated critical values. Provides general exercise guidelines with specific guidelines to those with heart or lung disease.    Aerobic Exercise & Resistance Training: - Gives group verbal and written instruction on the various components of exercise. Focuses on aerobic and resistive training programs and the benefits of this training and how to safely progress through these  programs..   Flexibility, Balance, Mind/Body Relaxation: Provides group verbal/written instruction on the benefits of flexibility and balance training, including mind/body exercise modes such as yoga, pilates and tai chi.  Demonstration and skill practice provided.   Stress and Anxiety: - Provides group verbal and written instruction about the health risks of elevated stress and causes of high stress.  Discuss the correlation between heart/lung disease and anxiety and treatment options. Review healthy ways to manage with stress and anxiety.   Depression: - Provides group verbal and written instruction on the correlation between heart/lung disease and depressed mood, treatment options, and the stigmas associated with seeking treatment.   Anatomy & Physiology of the Heart: - Group verbal and written instruction and models provide basic cardiac anatomy and physiology, with the coronary electrical and arterial systems. Review of Valvular disease and Heart Failure   Cardiac Procedures: - Group verbal and written instruction to review commonly prescribed medications for heart disease. Reviews the medication, class of the drug, and side effects. Includes the steps to properly store meds and maintain the prescription regimen. (beta blockers and nitrates)   Cardiac Medications I: - Group verbal and written instruction to review commonly prescribed medications for heart disease. Reviews the medication, class of the drug, and side effects. Includes the steps to properly store meds and maintain the prescription regimen.   Cardiac Medications II: -Group verbal and written instruction to review commonly prescribed medications for heart disease. Reviews the medication, class of the drug, and side effects. (all other drug classes)    Go Sex-Intimacy & Heart Disease, Get SMART - Goal Setting: - Group verbal and written instruction through game format to discuss heart disease and the return to sexual  intimacy. Provides group verbal and written material to discuss and apply goal setting through the application of the S.M.A.R.T. Method.   Other Matters of the Heart: - Provides group verbal, written materials and models to describe Stable Angina and Peripheral Artery. Includes description of the disease process and treatment options available to the cardiac patient.   Exercise & Equipment Safety: - Individual verbal instruction and demonstration of equipment use and safety with use of the equipment.   Cardiac Rehab from 02/05/2018 in [facility] Cardiac and Pulmonary Rehab  Date  02/05/18  Educator  [facility]  Instruction Review Code  1- Verbalizes Understanding      Infection Prevention: - Provides verbal and written material to individual with discussion of infection control including proper hand washing and proper equipment cleaning during exercise session.   Cardiac Rehab from 02/05/2018 in [facility] Cardiac and Pulmonary Rehab  Date  02/05/18  Educator  [facility]  Instruction Review Code  1- Verbalizes Understanding      Falls Prevention: - Provides verbal and written material to individual with discussion of falls prevention and safety.   Cardiac Rehab from 02/05/2018 in [facility] Cardiac and Pulmonary Rehab  Date  02/05/18  Educator  [facility]  Instruction Review Code  1- Verbalizes Understanding      Diabetes: - Individual verbal and written instruction to review signs/symptoms of diabetes, desired ranges of glucose level fasting, after meals and with exercise. Acknowledge that pre and post exercise glucose checks will be done for 3 sessions at entry of program.   Know Your Numbers and Risk Factors: -Group verbal and written instruction about important numbers in your health.  Discussion of what are risk factors and how they play a role in the disease process.  Review of Cholesterol, Blood Pressure, Diabetes, and BMI  and the role they play in your overall health.   Sleep Hygiene: -Provides group verbal  and written instruction about how sleep can affect your health.  Define sleep hygiene, discuss sleep cycles and impact of sleep habits. Review good sleep hygiene tips.    Other: -Provides group and verbal instruction on various topics (see comments)   Knowledge Questionnaire Score: Knowledge Questionnaire Score - 02/05/18 1253      Knowledge Questionnaire Score   Pre Score  23/28 reviewed correct responses with Jeneen Rinks and he verbalized understanding of the responses. No further questions were asked today       Core Components/Risk Factors/Patient Goals at Admission: Personal Goals and Risk Factors at Admission - 02/05/18 1240      Core Components/Risk Factors/Patient Goals on Admission    Weight Management  Yes;Weight Loss    Intervention  Weight Management: Develop a combined nutrition and exercise program designed to reach desired caloric intake, while maintaining appropriate intake of nutrient and fiber, sodium and fats, and appropriate energy expenditure required for the weight goal.;Weight Management: Provide education and appropriate resources to help participant work on and attain dietary goals.    Admit Weight  180 lb (81.6 kg)    Goal Weight: Short Term  178 lb (80.7 kg)    Goal Weight: Long Term  175 lb (79.4 kg)    Expected Outcomes  Short Term: Continue to assess and modify interventions until short term weight is achieved;Long Term: Adherence to nutrition and physical activity/exercise program aimed toward attainment of established weight goal;Weight Loss: Understanding of general recommendations for a balanced deficit meal plan, which promotes 1-2 lb weight loss per week and includes a negative energy balance of [phone] kcal/d;Understanding recommendations for meals to include 15-35% energy as protein, 25-35% energy from fat, 35-60% energy from carbohydrates, less than 216m of dietary cholesterol, 20-35 gm of total fiber daily;Understanding of distribution of calorie intake  throughout the day with the consumption of 4-5 meals/snacks BMI is 26.   Would advise small weight loss if possible.  Exercise and nutrition changes may make this happen    Diabetes  Yes IS being watched by MD for DIabetes.  A1C has been close to Diabetes diagnosis numbers.  Blood work is scheduled for followp in a few months.  Will work on the exercise and nutrition while in program    Intervention  Provide education about signs/symptoms and action to take for hypo/hyperglycemia.;Provide education about proper nutrition, including hydration, and aerobic/resistive exercise prescription along with prescribed medications to achieve blood glucose in normal ranges: Fasting glucose 65-99 mg/dL    Expected Outcomes  Short Term: Participant verbalizes understanding of the signs/symptoms and immediate care of hyper/hypoglycemia, proper foot care and importance of medication, aerobic/resistive exercise and nutrition plan for blood glucose control.;Long Term: Attainment of HbA1C < 7%.    Hypertension  Yes    Intervention  Provide education on lifestyle modifcations including regular physical activity/exercise, weight management, moderate sodium restriction and increased consumption of fresh fruit, vegetables, and low fat dairy, alcohol moderation, and smoking cessation.;Monitor prescription use compliance.    Expected Outcomes  Short Term: Continued assessment and intervention until BP is < 140/973mHG in hypertensive participants. < 130/8028mG in hypertensive participants with diabetes, heart failure or chronic kidney disease.;Long Term: Maintenance of blood pressure at goal levels.    Lipids  Yes    Intervention  Provide education and support for participant on nutrition & aerobic/resistive exercise along with prescribed medications to achieve LDL <  45m, HDL >465m    Expected Outcomes  Short Term: Participant states understanding of desired cholesterol values and is compliant with medications prescribed.  Participant is following exercise prescription and nutrition guidelines.;Long Term: Cholesterol controlled with medications as prescribed, with individualized exercise RX and with personalized nutrition plan. Value goals: LDL < 7056mHDL > 40 mg.       Core Components/Risk Factors/Patient Goals Review:    Core Components/Risk Factors/Patient Goals at Discharge (Final Review):    ITP Comments: ITP Comments    Row Name 02/05/18 1236           ITP Comments  Medical review completed. INitial ITP sent to Dr M MLoleta Chancer review, changes as needed and signature. [facility]          Comments: Initial ITP

## 2018-02-05 NOTE — Patient Instructions (Signed)
Patient Instructions  Patient Details  Name: Barry [name] MRN: [id number] Date of Birth: 03-10-1946 Referring Provider:  Nelva Bush, MD  Below are your personal goals for exercise, nutrition, and risk factors. Our goal is to help you stay on track towards obtaining and maintaining these goals. We will be discussing your progress on these goals with you throughout the program.  Initial Exercise Prescription: Initial Exercise Prescription - 02/05/18 1400      Date of Initial Exercise RX and Referring Provider   Date  02/05/18    Referring Provider  End, Harrell Gave MD      Treadmill   MPH  1.9    Grade  0.5    Minutes  15    METs  2.59      Recumbant Bike   Level  1    RPM  50    Watts  10    Minutes  15    METs  2.5      NuStep   Level  1    SPM  80    Minutes  15    METs  2.5      Prescription Details   Frequency (times per week)  2    Duration  Progress to 30 minutes of continuous aerobic without signs/symptoms of physical distress      Intensity   THRR 40-80% of Max Heartrate  99-132    Ratings of Perceived Exertion  11-13    Perceived Dyspnea  0-4      Progression   Progression  Continue to progress workloads to maintain intensity without signs/symptoms of physical distress.      Resistance Training   Training Prescription  Yes    Weight  3 lbs    Reps  10-15       Exercise Goals: Frequency: Be able to perform aerobic exercise two to three times per week in program working toward 2-5 days per week of home exercise.  Intensity: Work with a perceived exertion of 11 (fairly light) - 15 (hard) while following your exercise prescription.  We will make changes to your prescription with you as you progress through the program.   Duration: Be able to do 30 to 45 minutes of continuous aerobic exercise in addition to a 5 minute warm-up and a 5 minute cool-down routine.   Nutrition Goals: Your personal nutrition goals will be established when you do your  nutrition analysis with the dietician.  The following are general nutrition guidelines to follow: Cholesterol < 200mg /day Sodium < 1500mg /day Fiber: Men over 50 yrs - 30 grams per day  Personal Goals: Personal Goals and Risk Factors at Admission - 02/05/18 1240      Core Components/Risk Factors/Patient Goals on Admission    Weight Management  Yes;Weight Loss    Intervention  Weight Management: Develop a combined nutrition and exercise program designed to reach desired caloric intake, while maintaining appropriate intake of nutrient and fiber, sodium and fats, and appropriate energy expenditure required for the weight goal.;Weight Management: Provide education and appropriate resources to help participant work on and attain dietary goals.    Admit Weight  180 lb (81.6 kg)    Goal Weight: Short Term  178 lb (80.7 kg)    Goal Weight: Long Term  175 lb (79.4 kg)    Expected Outcomes  Short Term: Continue to assess and modify interventions until short term weight is achieved;Long Term: Adherence to nutrition and physical activity/exercise program aimed toward attainment of established  weight goal;Weight Loss: Understanding of general recommendations for a balanced deficit meal plan, which promotes 1-2 lb weight loss per week and includes a negative energy balance of [phone] kcal/d;Understanding recommendations for meals to include 15-35% energy as protein, 25-35% energy from fat, 35-60% energy from carbohydrates, less than 200mg  of dietary cholesterol, 20-35 gm of total fiber daily;Understanding of distribution of calorie intake throughout the day with the consumption of 4-5 meals/snacks BMI is 26.   Would advise small weight loss if possible.  Exercise and nutrition changes may make this happen    Diabetes  Yes IS being watched by MD for DIabetes.  A1C has been close to Diabetes diagnosis numbers.  Blood work is scheduled for followp in a few months.  Will work on the exercise and nutrition while in  program    Intervention  Provide education about signs/symptoms and action to take for hypo/hyperglycemia.;Provide education about proper nutrition, including hydration, and aerobic/resistive exercise prescription along with prescribed medications to achieve blood glucose in normal ranges: Fasting glucose 65-99 mg/dL    Expected Outcomes  Short Term: Participant verbalizes understanding of the signs/symptoms and immediate care of hyper/hypoglycemia, proper foot care and importance of medication, aerobic/resistive exercise and nutrition plan for blood glucose control.;Long Term: Attainment of HbA1C < 7%.    Hypertension  Yes    Intervention  Provide education on lifestyle modifcations including regular physical activity/exercise, weight management, moderate sodium restriction and increased consumption of fresh fruit, vegetables, and low fat dairy, alcohol moderation, and smoking cessation.;Monitor prescription use compliance.    Expected Outcomes  Short Term: Continued assessment and intervention until BP is < 140/84mm HG in hypertensive participants. < 130/74mm HG in hypertensive participants with diabetes, heart failure or chronic kidney disease.;Long Term: Maintenance of blood pressure at goal levels.    Lipids  Yes    Intervention  Provide education and support for participant on nutrition & aerobic/resistive exercise along with prescribed medications to achieve LDL 70mg , HDL >40mg .    Expected Outcomes  Short Term: Participant states understanding of desired cholesterol values and is compliant with medications prescribed. Participant is following exercise prescription and nutrition guidelines.;Long Term: Cholesterol controlled with medications as prescribed, with individualized exercise RX and with personalized nutrition plan. Value goals: LDL < 70mg , HDL > 40 mg.       Tobacco Use Initial Evaluation: Social History   Tobacco Use  Smoking Status Never Smoker  Smokeless Tobacco Former Systems developer  .  Types: Chew    Exercise Goals and Review: Exercise Goals    Row Name 02/05/18 1439             Exercise Goals   Increase Physical Activity  Yes       Intervention  Provide advice, education, support and counseling about physical activity/exercise needs.;Develop an individualized exercise prescription for aerobic and resistive training based on initial evaluation findings, risk stratification, comorbidities and participant's personal goals.       Expected Outcomes  Short Term: Attend rehab on a regular basis to increase amount of physical activity.;Long Term: Add in home exercise to make exercise part of routine and to increase amount of physical activity.;Long Term: Exercising regularly at least 3-5 days a week.       Increase Strength and Stamina  Yes       Intervention  Provide advice, education, support and counseling about physical activity/exercise needs.;Develop an individualized exercise prescription for aerobic and resistive training based on initial evaluation findings, risk stratification, comorbidities and participant's personal  goals.       Expected Outcomes  Short Term: Increase workloads from initial exercise prescription for resistance, speed, and METs.;Short Term: Perform resistance training exercises routinely during rehab and add in resistance training at home;Long Term: Improve cardiorespiratory fitness, muscular endurance and strength as measured by increased METs and functional capacity (6MWT)       Able to understand and use rate of perceived exertion (RPE) scale  Yes       Intervention  Provide education and explanation on how to use RPE scale       Expected Outcomes  Short Term: Able to use RPE daily in rehab to express subjective intensity level;Long Term:  Able to use RPE to guide intensity level when exercising independently       Knowledge and understanding of Target Heart Rate Range (THRR)  Yes       Intervention  Provide education and explanation of THRR including  how the numbers were predicted and where they are located for reference       Expected Outcomes  Short Term: Able to state/look up THRR;Long Term: Able to use THRR to govern intensity when exercising independently;Short Term: Able to use daily as guideline for intensity in rehab       Able to check pulse independently  Yes       Intervention  Provide education and demonstration on how to check pulse in carotid and radial arteries.;Review the importance of being able to check your own pulse for safety during independent exercise       Expected Outcomes  Short Term: Able to explain why pulse checking is important during independent exercise;Long Term: Able to check pulse independently and accurately       Understanding of Exercise Prescription  Yes       Intervention  Provide education, explanation, and written materials on patient's individual exercise prescription       Expected Outcomes  Short Term: Able to explain program exercise prescription;Long Term: Able to explain home exercise prescription to exercise independently          Copy of goals given to participant.

## 2018-02-07 ENCOUNTER — Encounter: Payer: Self-pay | Admitting: *Deleted

## 2018-02-07 DIAGNOSIS — Z955 Presence of coronary angioplasty implant and graft: Secondary | ICD-10-CM

## 2018-02-07 DIAGNOSIS — I213 ST elevation (STEMI) myocardial infarction of unspecified site: Secondary | ICD-10-CM

## 2018-02-07 NOTE — Progress Notes (Signed)
Cardiac Individual Treatment Plan  Patient Details  Name: Barry [name] MRN: [id number] Date of Birth: 02-Sep-1946 Referring Provider:     Cardiac Rehab from 02/05/2018 in [facility] Cardiac and Pulmonary Rehab  Referring Provider  End, Harrell Gave MD      Initial Encounter Date:    Cardiac Rehab from 02/05/2018 in [facility] Cardiac and Pulmonary Rehab  Date  02/05/18  Referring Provider  End, Harrell Gave MD      Visit Diagnosis: ST elevation myocardial infarction (STEMI), unspecified artery [facility])  Status post coronary artery stent placement  Patient's Home Medications on Admission:  Current Outpatient Medications:  .  aspirin 81 MG tablet, Take 81 mg by mouth 2 (two) times daily., Disp: , Rfl:  .  atorvastatin (LIPITOR) 80 MG tablet, Take 1 tablet (80 mg total) by mouth daily at 6 PM., Disp: 90 tablet, Rfl: 3 .  carvedilol (COREG) 6.25 MG tablet, Take 1 tablet (6.25 mg total) by mouth 2 (two) times daily., Disp: 180 tablet, Rfl: 3 .  loratadine (CLARITIN) 10 MG tablet, Take 1 tablet (10 mg total) by mouth as needed., Disp: , Rfl:  .  losartan (COZAAR) 25 MG tablet, Take 1 tablet (25 mg total) by mouth daily., Disp: 90 tablet, Rfl: 3 .  Multiple Vitamin (MULTIVITAMIN WITH MINERALS) TABS tablet, Take 1 tablet by mouth every other day., Disp: , Rfl:  .  ranitidine (ZANTAC) 150 MG capsule, Take 150 mg by mouth daily as needed. , Disp: , Rfl:  .  tamsulosin (FLOMAX) 0.4 MG CAPS capsule, Take 1 capsule (0.4 mg total) by mouth daily., Disp: 30 capsule, Rfl: 5 .  ticagrelor (BRILINTA) 90 MG TABS tablet, Take 1 tablet (90 mg total) by mouth 2 (two) times daily., Disp: 180 tablet, Rfl: 3  Past Medical History: Past Medical History:  Diagnosis Date  . Arthritis   . BPH (benign prostatic hypertrophy)   . GERD (gastroesophageal reflux disease)   . Hyperlipidemia   . Hypertension     Tobacco Use: Social History   Tobacco Use  Smoking Status Never Smoker  Smokeless Tobacco Former Systems developer  .  Types: Chew    Labs: Recent Review Flowsheet Data    Labs for ITP Cardiac and Pulmonary Rehab Latest Ref Rng & Units 06/15/2016 11/14/2016 06/27/2017 12/25/2017 01/16/2018   Cholestrol 0 - 200 mg/dL 157 - 126 - 142   LDLCALC 0 - 99 mg/dL 90 - 69 - 77   HDL >40 mg/dL 44 - 40(L) - 39(L)   Trlycerides <150 mg/dL 116 - 89 - 129   Hemoglobin A1c <5.7 % of total Hgb 5.9(H) 6.6% 6.0(H) 6.4(H) -   TCO2 22 - 32 mmol/L - - - - 19(L)       Exercise Target Goals:    Exercise Program Goal: Individual exercise prescription set using results from initial 6 min walk test and THRR while considering  patient's activity barriers and safety.   Exercise Prescription Goal: Initial exercise prescription builds to 30-45 minutes a day of aerobic activity, 2-3 days per week.  Home exercise guidelines will be given to patient during program as part of exercise prescription that the participant will acknowledge.  Activity Barriers & Risk Stratification: Activity Barriers & Cardiac Risk Stratification - 02/05/18 1238      Activity Barriers & Cardiac Risk Stratification   Activity Barriers  Joint Problems;Shortness of Breath;Muscular Weakness;Deconditioning;Balance Concerns R knee has had arthoscopy, chronic knee pain    Cardiac Risk Stratification  Moderate  6 Minute Walk: 6 Minute Walk    Row Name 02/05/18 1433         6 Minute Walk   Phase  Initial     Distance  1035 feet     Walk Time  6 minutes     # of Rest Breaks  0     MPH  1.96     METS  2.21     RPE  9     VO2 Peak  7.75     Symptoms  No     Resting HR  66 bpm     Resting BP  128/70     Resting Oxygen Saturation   98 %     Exercise Oxygen Saturation  during 6 min walk  98 %     Max Ex. HR  82 bpm     Max Ex. BP  120/74     2 Minute Post BP  124/70        Oxygen Initial Assessment:   Oxygen Re-Evaluation:   Oxygen Discharge (Final Oxygen Re-Evaluation):   Initial Exercise Prescription: Initial Exercise Prescription -  02/05/18 1400      Date of Initial Exercise RX and Referring Provider   Date  02/05/18    Referring Provider  End, Harrell Gave MD      Treadmill   MPH  1.9    Grade  0.5    Minutes  15    METs  2.59      Recumbant Bike   Level  1    RPM  50    Watts  10    Minutes  15    METs  2.5      NuStep   Level  1    SPM  80    Minutes  15    METs  2.5      Prescription Details   Frequency (times per week)  2    Duration  Progress to 30 minutes of continuous aerobic without signs/symptoms of physical distress      Intensity   THRR 40-80% of Max Heartrate  99-132    Ratings of Perceived Exertion  11-13    Perceived Dyspnea  0-4      Progression   Progression  Continue to progress workloads to maintain intensity without signs/symptoms of physical distress.      Resistance Training   Training Prescription  Yes    Weight  3 lbs    Reps  10-15       Perform Capillary Blood Glucose checks as needed.  Exercise Prescription Changes: Exercise Prescription Changes    Row Name 02/05/18 1200             Response to Exercise   Blood Pressure (Admit)  128/70       Blood Pressure (Exercise)  120/74       Blood Pressure (Exit)  124/70       Heart Rate (Admit)  66 bpm       Heart Rate (Exercise)  82 bpm       Heart Rate (Exit)  76 bpm       Oxygen Saturation (Admit)  98 %       Oxygen Saturation (Exercise)  98 %       Rating of Perceived Exertion (Exercise)  9       Symptoms  none       Comments  walk test results  Exercise Comments:   Exercise Goals and Review: Exercise Goals    Row Name 02/05/18 1439             Exercise Goals   Increase Physical Activity  Yes       Intervention  Provide advice, education, support and counseling about physical activity/exercise needs.;Develop an individualized exercise prescription for aerobic and resistive training based on initial evaluation findings, risk stratification, comorbidities and participant's personal  goals.       Expected Outcomes  Short Term: Attend rehab on a regular basis to increase amount of physical activity.;Long Term: Add in home exercise to make exercise part of routine and to increase amount of physical activity.;Long Term: Exercising regularly at least 3-5 days a week.       Increase Strength and Stamina  Yes       Intervention  Provide advice, education, support and counseling about physical activity/exercise needs.;Develop an individualized exercise prescription for aerobic and resistive training based on initial evaluation findings, risk stratification, comorbidities and participant's personal goals.       Expected Outcomes  Short Term: Increase workloads from initial exercise prescription for resistance, speed, and METs.;Short Term: Perform resistance training exercises routinely during rehab and add in resistance training at home;Long Term: Improve cardiorespiratory fitness, muscular endurance and strength as measured by increased METs and functional capacity (6MWT)       Able to understand and use rate of perceived exertion (RPE) scale  Yes       Intervention  Provide education and explanation on how to use RPE scale       Expected Outcomes  Short Term: Able to use RPE daily in rehab to express subjective intensity level;Long Term:  Able to use RPE to guide intensity level when exercising independently       Knowledge and understanding of Target Heart Rate Range (THRR)  Yes       Intervention  Provide education and explanation of THRR including how the numbers were predicted and where they are located for reference       Expected Outcomes  Short Term: Able to state/look up THRR;Long Term: Able to use THRR to govern intensity when exercising independently;Short Term: Able to use daily as guideline for intensity in rehab       Able to check pulse independently  Yes       Intervention  Provide education and demonstration on how to check pulse in carotid and radial arteries.;Review the  importance of being able to check your own pulse for safety during independent exercise       Expected Outcomes  Short Term: Able to explain why pulse checking is important during independent exercise;Long Term: Able to check pulse independently and accurately       Understanding of Exercise Prescription  Yes       Intervention  Provide education, explanation, and written materials on patient's individual exercise prescription       Expected Outcomes  Short Term: Able to explain program exercise prescription;Long Term: Able to explain home exercise prescription to exercise independently          Exercise Goals Re-Evaluation :   Discharge Exercise Prescription (Final Exercise Prescription Changes): Exercise Prescription Changes - 02/05/18 1200      Response to Exercise   Blood Pressure (Admit)  128/70    Blood Pressure (Exercise)  120/74    Blood Pressure (Exit)  124/70    Heart Rate (Admit)  66 bpm    Heart Rate (  Exercise)  82 bpm    Heart Rate (Exit)  76 bpm    Oxygen Saturation (Admit)  98 %    Oxygen Saturation (Exercise)  98 %    Rating of Perceived Exertion (Exercise)  9    Symptoms  none    Comments  walk test results       Nutrition:  Target Goals: Understanding of nutrition guidelines, daily intake of sodium <1534m, cholesterol <2088m calories 30% from fat and 7% or less from saturated fats, daily to have 5 or more servings of fruits and vegetables.  Biometrics: Pre Biometrics - 02/05/18 1440      Pre Biometrics   Height  5' 8.75" (1.746 m)    Weight  180 lb (81.6 kg)    Waist Circumference  37 inches    Hip Circumference  38.5 inches    Waist to Hip Ratio  0.96 %    BMI (Calculated)  26.78    Single Leg Stand  2.79 seconds        Nutrition Therapy Plan and Nutrition Goals: Nutrition Therapy & Goals - 02/05/18 1240      Intervention Plan   Intervention  Prescribe, educate and counsel regarding individualized specific dietary modifications aiming towards  targeted core components such as weight, hypertension, lipid management, diabetes, heart failure and other comorbidities.    Expected Outcomes  Short Term Goal: Understand basic principles of dietary content, such as calories, fat, sodium, cholesterol and nutrients.;Short Term Goal: A plan has been developed with personal nutrition goals set during dietitian appointment.;Long Term Goal: Adherence to prescribed nutrition plan.       Nutrition Assessments: Nutrition Assessments - 02/05/18 1240      MEDFICTS Scores   Pre Score  34       Nutrition Goals Re-Evaluation:   Nutrition Goals Discharge (Final Nutrition Goals Re-Evaluation):   Psychosocial: Target Goals: Acknowledge presence or absence of significant depression and/or stress, maximize coping skills, provide positive support system. Participant is able to verbalize types and ability to use techniques and skills needed for reducing stress and depression.   Initial Review & Psychosocial Screening: Initial Psych Review & Screening - 02/05/18 1251      Initial Review   Current issues with  None Identified      Family Dynamics   Good Support System?  Yes Spouse, family, friends, pastor      Barriers   Psychosocial barriers to participate in program  There are no identifiable barriers or psychosocial needs.;The patient should benefit from training in stress management and relaxation.      Screening Interventions   Interventions  Encouraged to exercise;Provide feedback about the scores to participant;To provide support and resources with identified psychosocial needs    Expected Outcomes  Short Term goal: Utilizing psychosocial counselor, staff and physician to assist with identification of specific Stressors or current issues interfering with healing process. Setting desired goal for each stressor or current issue identified.;Long Term Goal: Stressors or current issues are controlled or eliminated.;Short Term goal: Identification  and review with participant of any Quality of Life or Depression concerns found by scoring the questionnaire.;Long Term goal: The participant improves quality of Life and PHQ9 Scores as seen by post scores and/or verbalization of changes       Quality of Life Scores:  Quality of Life - 02/05/18 1252      Quality of Life Scores   Health/Function Pre  26.18 %    Socioeconomic Pre  30 %  Psych/Spiritual Pre  30 %    Family Pre  30 %    GLOBAL Pre  28.5 %      Scores of 19 and below usually indicate a poorer quality of life in these areas.  A difference of  2-3 points is a clinically meaningful difference.  A difference of 2-3 points in the total score of the Quality of Life Index has been associated with significant improvement in overall quality of life, self-image, physical symptoms, and general health in studies assessing change in quality of life.  PHQ-9: Recent Review Flowsheet Data    Depression screen [facility] 2/9 02/05/2018 01/01/2018 06/27/2017 08/09/2016 06/06/2016   Decreased Interest 0 0 0 1 0   Down, Depressed, Hopeless 0 0 0 0 0   PHQ - 2 Score 0 0 0 1 0   Altered sleeping 0 - - - -   Tired, decreased energy 0 - - - -   Change in appetite 0 - - - -   Feeling bad or failure about yourself  0 - - - -   Trouble concentrating 0 - - - -   Moving slowly or fidgety/restless 0 - - - -   Suicidal thoughts 0 - - - -   PHQ-9 Score 0 - - - -   Difficult doing work/chores Not difficult at all - - - -     Interpretation of Total Score  Total Score Depression Severity:  1-4 = Minimal depression, 5-9 = Mild depression, 10-14 = Moderate depression, 15-19 = Moderately severe depression, 20-27 = Severe depression   Psychosocial Evaluation and Intervention:   Psychosocial Re-Evaluation:   Psychosocial Discharge (Final Psychosocial Re-Evaluation):   Vocational Rehabilitation: Provide vocational rehab assistance to qualifying candidates.   Vocational Rehab Evaluation &  Intervention: Vocational Rehab - 02/05/18 1254      Initial Vocational Rehab Evaluation & Intervention   Assessment shows need for Vocational Rehabilitation  No       Education: Education Goals: Education classes will be provided on a variety of topics geared toward better understanding of heart health and risk factor modification. Participant will state understanding/return demonstration of topics presented as noted by education test scores.  Learning Barriers/Preferences: Learning Barriers/Preferences - 02/05/18 1253      Learning Barriers/Preferences   Learning Barriers  Exercise Concerns    Learning Preferences  Verbal Instruction;Written Material       Education Topics:  AED/CPR: - Group verbal and written instruction with the use of models to demonstrate the basic use of the AED with the basic ABC's of resuscitation.   General Nutrition Guidelines/Fats and Fiber: -Group instruction provided by verbal, written material, models and posters to present the general guidelines for heart healthy nutrition. Gives an explanation and review of dietary fats and fiber.   Controlling Sodium/Reading Food Labels: -Group verbal and written material supporting the discussion of sodium use in heart healthy nutrition. Review and explanation with models, verbal and written materials for utilization of the food label.   Exercise Physiology & General Exercise Guidelines: - Group verbal and written instruction with models to review the exercise physiology of the cardiovascular system and associated critical values. Provides general exercise guidelines with specific guidelines to those with heart or lung disease.    Aerobic Exercise & Resistance Training: - Gives group verbal and written instruction on the various components of exercise. Focuses on aerobic and resistive training programs and the benefits of this training and how to safely progress through these programs.Marland Kitchen  Flexibility,  Balance, Mind/Body Relaxation: Provides group verbal/written instruction on the benefits of flexibility and balance training, including mind/body exercise modes such as yoga, pilates and tai chi.  Demonstration and skill practice provided.   Stress and Anxiety: - Provides group verbal and written instruction about the health risks of elevated stress and causes of high stress.  Discuss the correlation between heart/lung disease and anxiety and treatment options. Review healthy ways to manage with stress and anxiety.   Depression: - Provides group verbal and written instruction on the correlation between heart/lung disease and depressed mood, treatment options, and the stigmas associated with seeking treatment.   Anatomy & Physiology of the Heart: - Group verbal and written instruction and models provide basic cardiac anatomy and physiology, with the coronary electrical and arterial systems. Review of Valvular disease and Heart Failure   Cardiac Procedures: - Group verbal and written instruction to review commonly prescribed medications for heart disease. Reviews the medication, class of the drug, and side effects. Includes the steps to properly store meds and maintain the prescription regimen. (beta blockers and nitrates)   Cardiac Medications I: - Group verbal and written instruction to review commonly prescribed medications for heart disease. Reviews the medication, class of the drug, and side effects. Includes the steps to properly store meds and maintain the prescription regimen.   Cardiac Medications II: -Group verbal and written instruction to review commonly prescribed medications for heart disease. Reviews the medication, class of the drug, and side effects. (all other drug classes)    Go Sex-Intimacy & Heart Disease, Get SMART - Goal Setting: - Group verbal and written instruction through game format to discuss heart disease and the return to sexual intimacy. Provides group  verbal and written material to discuss and apply goal setting through the application of the S.M.A.R.T. Method.   Other Matters of the Heart: - Provides group verbal, written materials and models to describe Stable Angina and Peripheral Artery. Includes description of the disease process and treatment options available to the cardiac patient.   Exercise & Equipment Safety: - Individual verbal instruction and demonstration of equipment use and safety with use of the equipment.   Cardiac Rehab from 02/05/2018 in [facility] Cardiac and Pulmonary Rehab  Date  02/05/18  Educator  [facility]  Instruction Review Code  1- Verbalizes Understanding      Infection Prevention: - Provides verbal and written material to individual with discussion of infection control including proper hand washing and proper equipment cleaning during exercise session.   Cardiac Rehab from 02/05/2018 in [facility] Cardiac and Pulmonary Rehab  Date  02/05/18  Educator  [facility]  Instruction Review Code  1- Verbalizes Understanding      Falls Prevention: - Provides verbal and written material to individual with discussion of falls prevention and safety.   Cardiac Rehab from 02/05/2018 in [facility] Cardiac and Pulmonary Rehab  Date  02/05/18  Educator  [facility]  Instruction Review Code  1- Verbalizes Understanding      Diabetes: - Individual verbal and written instruction to review signs/symptoms of diabetes, desired ranges of glucose level fasting, after meals and with exercise. Acknowledge that pre and post exercise glucose checks will be done for 3 sessions at entry of program.   Know Your Numbers and Risk Factors: -Group verbal and written instruction about important numbers in your health.  Discussion of what are risk factors and how they play a role in the disease process.  Review of Cholesterol, Blood Pressure, Diabetes, and BMI and the  role they play in your overall health.   Sleep Hygiene: -Provides group verbal and written instruction  about how sleep can affect your health.  Define sleep hygiene, discuss sleep cycles and impact of sleep habits. Review good sleep hygiene tips.    Other: -Provides group and verbal instruction on various topics (see comments)   Knowledge Questionnaire Score: Knowledge Questionnaire Score - 02/05/18 1253      Knowledge Questionnaire Score   Pre Score  23/28 reviewed correct responses with Jeneen Rinks and he verbalized understanding of the responses. No further questions were asked today       Core Components/Risk Factors/Patient Goals at Admission: Personal Goals and Risk Factors at Admission - 02/05/18 1240      Core Components/Risk Factors/Patient Goals on Admission    Weight Management  Yes;Weight Loss    Intervention  Weight Management: Develop a combined nutrition and exercise program designed to reach desired caloric intake, while maintaining appropriate intake of nutrient and fiber, sodium and fats, and appropriate energy expenditure required for the weight goal.;Weight Management: Provide education and appropriate resources to help participant work on and attain dietary goals.    Admit Weight  180 lb (81.6 kg)    Goal Weight: Short Term  178 lb (80.7 kg)    Goal Weight: Long Term  175 lb (79.4 kg)    Expected Outcomes  Short Term: Continue to assess and modify interventions until short term weight is achieved;Long Term: Adherence to nutrition and physical activity/exercise program aimed toward attainment of established weight goal;Weight Loss: Understanding of general recommendations for a balanced deficit meal plan, which promotes 1-2 lb weight loss per week and includes a negative energy balance of [phone] kcal/d;Understanding recommendations for meals to include 15-35% energy as protein, 25-35% energy from fat, 35-60% energy from carbohydrates, less than 271m of dietary cholesterol, 20-35 gm of total fiber daily;Understanding of distribution of calorie intake throughout the day with the  consumption of 4-5 meals/snacks BMI is 26.   Would advise small weight loss if possible.  Exercise and nutrition changes may make this happen    Diabetes  Yes IS being watched by MD for DIabetes.  A1C has been close to Diabetes diagnosis numbers.  Blood work is scheduled for followp in a few months.  Will work on the exercise and nutrition while in program    Intervention  Provide education about signs/symptoms and action to take for hypo/hyperglycemia.;Provide education about proper nutrition, including hydration, and aerobic/resistive exercise prescription along with prescribed medications to achieve blood glucose in normal ranges: Fasting glucose 65-99 mg/dL    Expected Outcomes  Short Term: Participant verbalizes understanding of the signs/symptoms and immediate care of hyper/hypoglycemia, proper foot care and importance of medication, aerobic/resistive exercise and nutrition plan for blood glucose control.;Long Term: Attainment of HbA1C < 7%.    Hypertension  Yes    Intervention  Provide education on lifestyle modifcations including regular physical activity/exercise, weight management, moderate sodium restriction and increased consumption of fresh fruit, vegetables, and low fat dairy, alcohol moderation, and smoking cessation.;Monitor prescription use compliance.    Expected Outcomes  Short Term: Continued assessment and intervention until BP is < 140/950mHG in hypertensive participants. < 130/8043mG in hypertensive participants with diabetes, heart failure or chronic kidney disease.;Long Term: Maintenance of blood pressure at goal levels.    Lipids  Yes    Intervention  Provide education and support for participant on nutrition & aerobic/resistive exercise along with prescribed medications to achieve LDL <52m28mDL >  44m.    Expected Outcomes  Short Term: Participant states understanding of desired cholesterol values and is compliant with medications prescribed. Participant is following exercise  prescription and nutrition guidelines.;Long Term: Cholesterol controlled with medications as prescribed, with individualized exercise RX and with personalized nutrition plan. Value goals: LDL < 746m HDL > 40 mg.       Core Components/Risk Factors/Patient Goals Review:    Core Components/Risk Factors/Patient Goals at Discharge (Final Review):    ITP Comments: ITP Comments    Row Name 02/05/18 1236 02/07/18 0601         ITP Comments  Medical review completed. INitial ITP sent to Dr M Loleta Chanceor review, changes as needed and signature. [facility]  [facility]. Continue with ITP unless directed changes per Medical Director.    new to  program         Comments:

## 2018-02-08 ENCOUNTER — Encounter: Payer: PPO | Admitting: *Deleted

## 2018-02-08 DIAGNOSIS — Z48812 Encounter for surgical aftercare following surgery on the circulatory system: Secondary | ICD-10-CM | POA: Diagnosis not present

## 2018-02-08 DIAGNOSIS — Z955 Presence of coronary angioplasty implant and graft: Secondary | ICD-10-CM

## 2018-02-08 DIAGNOSIS — I213 ST elevation (STEMI) myocardial infarction of unspecified site: Secondary | ICD-10-CM

## 2018-02-08 NOTE — Progress Notes (Signed)
Daily Session Note  Patient Details  Name: Barry [name] MRN: [id number] Date of Birth: 1946-02-12 Referring Provider:     Cardiac Rehab from 02/05/2018 in [facility] Cardiac and Pulmonary Rehab  Referring Provider  End, Harrell Gave MD      Encounter Date: 02/08/2018  Check In: Session Check In - 02/08/18 0827      Check-In   Location  [facility]-Cardiac & Pulmonary Rehab    Staff Present  Alberteen Sam, MA, RCEP, CCRP, Exercise Physiologist;Amanda Oletta Darter, BA, ACSM CEP, Exercise Physiologist;Carroll Enterkin, RN, BSN    Supervising physician immediately available to respond to emergencies  See telemetry face sheet for immediately available ER MD    Medication changes reported      No    Fall or balance concerns reported     No    Warm-up and Cool-down  Performed on first and last piece of equipment    Resistance Training Performed  Yes    VAD Patient?  No      Pain Assessment   Currently in Pain?  No/denies          Social History   Tobacco Use  Smoking Status Never Smoker  Smokeless Tobacco Former Systems developer  . Types: Chew    Goals Met:  Exercise tolerated well Personal goals reviewed No report of cardiac concerns or symptoms Strength training completed today  Goals Unmet:  Not Applicable  Comments: First full day of exercise!  Patient was oriented to gym and equipment including functions, settings, policies, and procedures.  Patient's individual exercise prescription and treatment plan were reviewed.  All starting workloads were established based on the results of the 6 minute walk test done at initial orientation visit.  The plan for exercise progression was also introduced and progression will be customized based on patient's performance and goals.    Dr. Emily Filbert is Medical Director for [place] and [name].

## 2018-02-12 ENCOUNTER — Ambulatory Visit (INDEPENDENT_AMBULATORY_CARE_PROVIDER_SITE_OTHER): Payer: PPO

## 2018-02-12 DIAGNOSIS — R0989 Other specified symptoms and signs involving the circulatory and respiratory systems: Secondary | ICD-10-CM

## 2018-02-13 ENCOUNTER — Encounter: Payer: PPO | Admitting: *Deleted

## 2018-02-13 DIAGNOSIS — Z48812 Encounter for surgical aftercare following surgery on the circulatory system: Secondary | ICD-10-CM | POA: Diagnosis not present

## 2018-02-13 DIAGNOSIS — Z955 Presence of coronary angioplasty implant and graft: Secondary | ICD-10-CM

## 2018-02-13 DIAGNOSIS — I213 ST elevation (STEMI) myocardial infarction of unspecified site: Secondary | ICD-10-CM

## 2018-02-13 NOTE — Progress Notes (Signed)
Daily Session Note  Patient Details  Name: Barry [name] MRN: [id number] Date of Birth: [birth year] Referring Provider:     Cardiac Rehab from 02/05/2018 in [facility] Cardiac and Pulmonary Rehab  Referring Provider  End, Harrell Gave MD      Encounter Date: 02/13/2018  Check In: Session Check In - 02/13/18 0918      Check-In   Location  [facility]Cardiac [facility] Pulmonary Rehab    Staff Present  Heath Lark, RN, BSN, CCRP;Jessica [place], MA, RCEP, CCRP, Exercise Physiologist;Amanda Oletta Darter, [place], ACSM CEP, Exercise Physiologist    Supervising physician immediately available to respond to emergencies  See telemetry face sheet for immediately available ER MD    Medication changes reported      No    Fall or balance concerns reported     No    Warm-up and Cool-down  Performed on first and last piece of equipment    Resistance Training Performed  Yes    VAD Patient?  No      Pain Assessment   Currently in Pain?  No/denies          Social History   Tobacco Use  Smoking Status Never Smoker  Smokeless Tobacco Former Systems developer  . Types: Chew    Goals Met:  Independence with exercise equipment Exercise tolerated well No report of cardiac concerns or symptoms Strength training completed today  Goals Unmet:  Not Applicable  Comments: Pt able to follow exercise prescription today without complaint.  Will continue to monitor for progression.    Dr. Emily Filbert is Medical Director for [place] and [name].

## 2018-02-15 ENCOUNTER — Encounter: Payer: PPO | Attending: Internal Medicine

## 2018-02-15 ENCOUNTER — Encounter: Payer: Self-pay | Admitting: Dietician

## 2018-02-15 DIAGNOSIS — Z48812 Encounter for surgical aftercare following surgery on the circulatory system: Secondary | ICD-10-CM | POA: Diagnosis not present

## 2018-02-15 DIAGNOSIS — Z955 Presence of coronary angioplasty implant and graft: Secondary | ICD-10-CM

## 2018-02-15 DIAGNOSIS — I213 ST elevation (STEMI) myocardial infarction of unspecified site: Secondary | ICD-10-CM | POA: Insufficient documentation

## 2018-02-15 NOTE — Progress Notes (Signed)
Daily Session Note  Patient Details  Name: Barry [name] MRN: [id number] Date of Birth: 02-Sep-1946 Referring Provider:     Cardiac Rehab from 02/05/2018 in [facility] Cardiac and Pulmonary Rehab  Referring Provider  End, Harrell Gave MD      Encounter Date: 02/15/2018  Check In: Session Check In - 02/15/18 0840      Check-In   Location  [facility]Cardiac & Pulmonary Rehab    Staff Present  Alberteen Sam, MA, RCEP, CCRP, Exercise Physiologist;Amanda Oletta Darter, BA, ACSM CEP, Exercise Physiologist;Carroll Enterkin, RN, BSN    Supervising physician immediately available to respond to emergencies  See telemetry face sheet for immediately available ER MD    Medication changes reported      No    Fall or balance concerns reported     No    Warm-up and Cool-down  Performed on first and last piece of equipment    Resistance Training Performed  Yes    VAD Patient?  No      Pain Assessment   Currently in Pain?  No/denies    Multiple Pain Sites  No          Social History   Tobacco Use  Smoking Status Never Smoker  Smokeless Tobacco Former Systems developer  . Types: Chew    Goals Met:  Independence with exercise equipment Exercise tolerated well No report of cardiac concerns or symptoms Strength training completed today  Goals Unmet:  Not Applicable  Comments: Pt able to follow exercise prescription today without complaint.  Will continue to monitor for progression.    Dr. Emily Filbert is Medical Director for [place] and [name].

## 2018-02-19 DIAGNOSIS — I480 Paroxysmal atrial fibrillation: Secondary | ICD-10-CM | POA: Diagnosis not present

## 2018-02-20 DIAGNOSIS — I213 ST elevation (STEMI) myocardial infarction of unspecified site: Secondary | ICD-10-CM

## 2018-02-20 DIAGNOSIS — Z48812 Encounter for surgical aftercare following surgery on the circulatory system: Secondary | ICD-10-CM | POA: Diagnosis not present

## 2018-02-20 DIAGNOSIS — Z955 Presence of coronary angioplasty implant and graft: Secondary | ICD-10-CM

## 2018-02-20 NOTE — Progress Notes (Signed)
Daily Session Note  Patient Details  Name: Barry [name] MRN: [id number] Date of Birth: Aug 09, 1946 Referring Provider:     Cardiac Rehab from 02/05/2018 in [facility] Cardiac and Pulmonary Rehab  Referring Provider  End, Harrell Gave MD      Encounter Date: 02/20/2018  Check In: Session Check In - 02/20/18 0832      Check-In   Location  [facility]Cardiac & Pulmonary Rehab    Staff Present  Alberteen Sam, MA, RCEP, CCRP, Exercise Physiologist;Amanda Oletta Darter, BA, ACSM CEP, Exercise Physiologist;Susanne Bice, RN, BSN, CCRP    Supervising physician immediately available to respond to emergencies  See telemetry face sheet for immediately available ER MD    Medication changes reported      No    Fall or balance concerns reported     No    Warm-up and Cool-down  Performed on first and last piece of equipment    Resistance Training Performed  Yes    VAD Patient?  No      Pain Assessment   Currently in Pain?  No/denies    Multiple Pain Sites  No          Social History   Tobacco Use  Smoking Status Never Smoker  Smokeless Tobacco Former Systems developer  . Types: Chew    Goals Met:  Independence with exercise equipment Exercise tolerated well Personal goals reviewed No report of cardiac concerns or symptoms Strength training completed today  Goals Unmet:  Not Applicable  Comments: Pt able to follow exercise prescription today without complaint.  Will continue to monitor for progression. See ITP for goal review   Dr. Emily Filbert is Medical Director for [place] and [name].

## 2018-02-22 ENCOUNTER — Encounter: Payer: PPO | Admitting: *Deleted

## 2018-02-22 ENCOUNTER — Telehealth: Payer: Self-pay | Admitting: Cardiovascular Disease

## 2018-02-22 DIAGNOSIS — I213 ST elevation (STEMI) myocardial infarction of unspecified site: Secondary | ICD-10-CM

## 2018-02-22 DIAGNOSIS — Z48812 Encounter for surgical aftercare following surgery on the circulatory system: Secondary | ICD-10-CM | POA: Diagnosis not present

## 2018-02-22 DIAGNOSIS — Z955 Presence of coronary angioplasty implant and graft: Secondary | ICD-10-CM

## 2018-02-22 NOTE — Telephone Encounter (Signed)
Patient wants to know if it is ok to go to dentist for cleanings and other procedures since he has a stent and is on bood thinners .  Please call.

## 2018-02-22 NOTE — Telephone Encounter (Signed)
Reviewed with Dr. Rockey Situ to make sure no SBE is needed, < 6 weeks out from PCI, prior to dental cleaning. Per MD- no pre-med is needed. I have notified the patient that he will not need to have any pre-med and should not need to hold brilinta prior to cleaning.  He is aware that he should not stop Brilinta this close to stenting. If any dental work is needed that requires holding his antiplatelet therapy he is aware the dental office would need to contact us directly.

## 2018-02-22 NOTE — Progress Notes (Signed)
Daily Session Note  Patient Details  Name: Barry [name] MRN: [id number] Date of Birth: 1946-03-09 Referring Provider:     Cardiac Rehab from 02/05/2018 in [facility] Cardiac and Pulmonary Rehab  Referring Provider  [name], Barry Gave MD      Encounter Date: 02/22/2018  Check In: Session Check In - 02/22/18 0828      Check-In   Location  [facility]-Cardiac & Pulmonary Rehab    Staff Present  Barry Sam, MA, RCEP, CCRP, Exercise Physiologist;Barry [name], BA, ACSM CEP, Exercise Physiologist;Barry Enterkin, RN, BSN    Supervising physician immediately available to respond to emergencies  See telemetry face sheet for immediately available ER MD    Medication changes reported      No    Fall or balance concerns reported     No    Warm-up and Cool-down  Performed on first and last piece of equipment    Resistance Training Performed  Yes    VAD Patient?  No      Pain Assessment   Currently in Pain?  No/denies          Social History   Tobacco Use  Smoking Status Never Smoker  Smokeless Tobacco Former Systems developer  . Types: [name]    Goals Met:  Independence with exercise equipment Exercise tolerated well No report of cardiac concerns or symptoms Strength training completed today  Goals Unmet:  Not Applicable  Comments: Pt able to follow exercise prescription today without complaint.  Will continue to monitor for progression.    Dr. Emily [name] is Medical Director for [place] and [name].

## 2018-02-27 DIAGNOSIS — Z955 Presence of coronary angioplasty implant and graft: Secondary | ICD-10-CM

## 2018-02-27 DIAGNOSIS — I213 ST elevation (STEMI) myocardial infarction of unspecified site: Secondary | ICD-10-CM

## 2018-02-27 DIAGNOSIS — Z48812 Encounter for surgical aftercare following surgery on the circulatory system: Secondary | ICD-10-CM | POA: Diagnosis not present

## 2018-02-27 NOTE — Progress Notes (Signed)
Daily Session Note  Patient Details  Name: Barry [name] MRN: [id number] Date of Birth: 30-Dec-1945 Referring Provider:     Cardiac Rehab from 02/05/2018 in [facility] Cardiac [facility] Pulmonary Rehab  Referring Provider  End, Harrell Gave MD      Encounter Date: 02/27/2018  Check In: Session Check In - 02/27/18 0913      Check-In   Location  [facility]Cardiac & Pulmonary Rehab    Staff Present  Heath Lark, RN, BSN, CCRP;Lauren Aguayo [place], MA, RCEP, CCRP, Exercise Physiologist;Amanda Oletta Darter, [place], ACSM CEP, Exercise Physiologist    Supervising physician immediately available to respond to emergencies  See telemetry face sheet for immediately available ER MD    Medication changes reported      No    Fall or balance concerns reported     No    Warm-up and Cool-down  Performed on first and last piece of equipment    Resistance Training Performed  Yes    VAD Patient?  No      Pain Assessment   Currently in Pain?  No/denies          Social History   Tobacco Use  Smoking Status Never Smoker  Smokeless Tobacco Former Systems developer  . Types: Chew    Goals Met:  Independence with exercise equipment Exercise tolerated well No report of cardiac concerns or symptoms Strength training completed today  Goals Unmet:  Not Applicable  Comments: Pt able to follow exercise prescription today without complaint.  Will continue to monitor for progression.    Dr. Emily Filbert is Medical Director for [place] and [name].

## 2018-03-01 DIAGNOSIS — I213 ST elevation (STEMI) myocardial infarction of unspecified site: Secondary | ICD-10-CM

## 2018-03-01 DIAGNOSIS — Z48812 Encounter for surgical aftercare following surgery on the circulatory system: Secondary | ICD-10-CM | POA: Diagnosis not present

## 2018-03-01 DIAGNOSIS — Z955 Presence of coronary angioplasty implant and graft: Secondary | ICD-10-CM

## 2018-03-01 NOTE — Progress Notes (Signed)
Daily Session Note  Patient Details  Name: Barry [name] MRN: [id number] Date of Birth: 09-02-1946 Referring Provider:     Cardiac Rehab from 02/05/2018 in [facility] Cardiac and Pulmonary Rehab  Referring Provider  End, Harrell Gave MD      Encounter Date: 03/01/2018  Check In: Session Check In - 03/01/18 1033      Check-In   Location  [facility]-Cardiac & Pulmonary Rehab    Staff Present  Gerlene Burdock, RN, BSN;Hartlee Amedee Luan [place], MA, RCEP, CCRP, Exercise Physiologist;Amanda Oletta Darter, BA, ACSM CEP, Exercise Physiologist    Supervising physician immediately available to respond to emergencies  See telemetry face sheet for immediately available ER MD    Medication changes reported      No    Fall or balance concerns reported     No    Warm-up and Cool-down  Performed on first and last piece of equipment    Resistance Training Performed  Yes    VAD Patient?  No      Pain Assessment   Currently in Pain?  No/denies          Social History   Tobacco Use  Smoking Status Never Smoker  Smokeless Tobacco Former Systems developer  . Types: Chew    Goals Met:  Independence with exercise equipment Exercise tolerated well No report of cardiac concerns or symptoms Strength training completed today  Goals Unmet:  Not Applicable  Comments: Pt able to follow exercise prescription today without complaint.  Will continue to monitor for progression.    Dr. Emily Filbert is Medical Director for [place] and [name].

## 2018-03-06 ENCOUNTER — Encounter: Payer: PPO | Admitting: *Deleted

## 2018-03-06 DIAGNOSIS — Z48812 Encounter for surgical aftercare following surgery on the circulatory system: Secondary | ICD-10-CM | POA: Diagnosis not present

## 2018-03-06 DIAGNOSIS — I213 ST elevation (STEMI) myocardial infarction of unspecified site: Secondary | ICD-10-CM

## 2018-03-06 DIAGNOSIS — Z955 Presence of coronary angioplasty implant and graft: Secondary | ICD-10-CM

## 2018-03-06 NOTE — Progress Notes (Signed)
Daily Session Note  Patient Details  Name: Barry [name] MRN: [id number] Date of Birth: [birth year] Referring Provider:     Cardiac Rehab from 02/05/2018 in [facility] Cardiac and Pulmonary Rehab  Referring Provider  End, Harrell Gave MD      Encounter Date: 03/06/2018  Check In: Session Check In - 03/06/18 0839      Check-In   Location  [facility]-Cardiac & Pulmonary Rehab    Staff Present  Heath Lark, RN, BSN, CCRP;Shahab Polhamus Luan [place]ing, MA, RCEP, CCRP, Exercise Physiologist;Amanda Oletta Darter, [place], ACSM CEP, Exercise Physiologist    Supervising physician immediately available to respond to emergencies  See telemetry face sheet for immediately available ER MD    Medication changes reported      No    Fall or balance concerns reported     No    Warm-up and Cool-down  Performed on first and last piece of equipment    Resistance Training Performed  Yes    VAD Patient?  No      Pain Assessment   Currently in Pain?  No/denies          Social History   Tobacco Use  Smoking Status Never Smoker  Smokeless Tobacco Former Systems developer  . Types: Chew    Goals Met:  Independence with exercise equipment Exercise tolerated well No report of cardiac concerns or symptoms Strength training completed today  Goals Unmet:  Not Applicable  Comments: Pt able to follow exercise prescription today without complaint.  Will continue to monitor for progression.    Dr. Emily Filbert is Medical Director for [place] and [name].

## 2018-03-07 ENCOUNTER — Encounter: Payer: Self-pay | Admitting: *Deleted

## 2018-03-07 DIAGNOSIS — Z955 Presence of coronary angioplasty implant and graft: Secondary | ICD-10-CM

## 2018-03-07 DIAGNOSIS — I213 ST elevation (STEMI) myocardial infarction of unspecified site: Secondary | ICD-10-CM

## 2018-03-07 NOTE — Progress Notes (Signed)
Cardiac Individual Treatment Plan  Patient Details  Name: Barry [name] MRN: [id number] Date of Birth: 02-Sep-1946 Referring Provider:     Cardiac Rehab from 02/05/2018 in [facility] Cardiac and Pulmonary Rehab  Referring Provider  End, Harrell Gave MD      Initial Encounter Date:    Cardiac Rehab from 02/05/2018 in [facility] Cardiac and Pulmonary Rehab  Date  02/05/18  Referring Provider  End, Harrell Gave MD      Visit Diagnosis: ST elevation myocardial infarction (STEMI), unspecified artery [facility])  Status post coronary artery stent placement  Patient's Home Medications on Admission:  Current Outpatient Medications:  .  aspirin 81 MG tablet, Take 81 mg by mouth 2 (two) times daily., Disp: , Rfl:  .  atorvastatin (LIPITOR) 80 MG tablet, Take 1 tablet (80 mg total) by mouth daily at 6 PM., Disp: 90 tablet, Rfl: 3 .  carvedilol (COREG) 6.25 MG tablet, Take 1 tablet (6.25 mg total) by mouth 2 (two) times daily., Disp: 180 tablet, Rfl: 3 .  loratadine (CLARITIN) 10 MG tablet, Take 1 tablet (10 mg total) by mouth as needed., Disp: , Rfl:  .  losartan (COZAAR) 25 MG tablet, Take 1 tablet (25 mg total) by mouth daily., Disp: 90 tablet, Rfl: 3 .  Multiple Vitamin (MULTIVITAMIN WITH MINERALS) TABS tablet, Take 1 tablet by mouth every other day., Disp: , Rfl:  .  ranitidine (ZANTAC) 150 MG capsule, Take 150 mg by mouth daily as needed. , Disp: , Rfl:  .  tamsulosin (FLOMAX) 0.4 MG CAPS capsule, Take 1 capsule (0.4 mg total) by mouth daily., Disp: 30 capsule, Rfl: 5 .  ticagrelor (BRILINTA) 90 MG TABS tablet, Take 1 tablet (90 mg total) by mouth 2 (two) times daily., Disp: 180 tablet, Rfl: 3  Past Medical History: Past Medical History:  Diagnosis Date  . Arthritis   . BPH (benign prostatic hypertrophy)   . GERD (gastroesophageal reflux disease)   . Hyperlipidemia   . Hypertension     Tobacco Use: Social History   Tobacco Use  Smoking Status Never Smoker  Smokeless Tobacco Former Systems developer  .  Types: Chew    Labs: Recent Review Flowsheet Data    Labs for ITP Cardiac and Pulmonary Rehab Latest Ref Rng & Units 06/15/2016 11/14/2016 06/27/2017 12/25/2017 01/16/2018   Cholestrol 0 - 200 mg/dL 157 - 126 - 142   LDLCALC 0 - 99 mg/dL 90 - 69 - 77   HDL >40 mg/dL 44 - 40(L) - 39(L)   Trlycerides <150 mg/dL 116 - 89 - 129   Hemoglobin A1c <5.7 % of total Hgb 5.9(H) 6.6% 6.0(H) 6.4(H) -   TCO2 22 - 32 mmol/L - - - - 19(L)       Exercise Target Goals:    Exercise Program Goal: Individual exercise prescription set using results from initial 6 min walk test and THRR while considering  patient's activity barriers and safety.   Exercise Prescription Goal: Initial exercise prescription builds to 30-45 minutes a day of aerobic activity, 2-3 days per week.  Home exercise guidelines will be given to patient during program as part of exercise prescription that the participant will acknowledge.  Activity Barriers & Risk Stratification: Activity Barriers & Cardiac Risk Stratification - 02/05/18 1238      Activity Barriers & Cardiac Risk Stratification   Activity Barriers  Joint Problems;Shortness of Breath;Muscular Weakness;Deconditioning;Balance Concerns R knee has had arthoscopy, chronic knee pain    Cardiac Risk Stratification  Moderate  6 Minute Walk: 6 Minute Walk    Row Name 02/05/18 1433         6 Minute Walk   Phase  Initial     Distance  1035 feet     Walk Time  6 minutes     # of Rest Breaks  0     MPH  1.96     METS  2.21     RPE  9     VO2 Peak  7.75     Symptoms  No     Resting HR  66 bpm     Resting BP  128/70     Resting Oxygen Saturation   98 %     Exercise Oxygen Saturation  during 6 min walk  98 %     Max Ex. HR  82 bpm     Max Ex. BP  120/74     2 Minute Post BP  124/70        Oxygen Initial Assessment:   Oxygen Re-Evaluation:   Oxygen Discharge (Final Oxygen Re-Evaluation):   Initial Exercise Prescription: Initial Exercise Prescription -  02/05/18 1400      Date of Initial Exercise RX and Referring Provider   Date  02/05/18    Referring Provider  End, Harrell Gave MD      Treadmill   MPH  1.9    Grade  0.5    Minutes  15    METs  2.59      Recumbant Bike   Level  1    RPM  50    Watts  10    Minutes  15    METs  2.5      NuStep   Level  1    SPM  80    Minutes  15    METs  2.5      Prescription Details   Frequency (times per week)  2    Duration  Progress to 30 minutes of continuous aerobic without signs/symptoms of physical distress      Intensity   THRR 40-80% of Max Heartrate  99-132    Ratings of Perceived Exertion  11-13    Perceived Dyspnea  0-4      Progression   Progression  Continue to progress workloads to maintain intensity without signs/symptoms of physical distress.      Resistance Training   Training Prescription  Yes    Weight  3 lbs    Reps  10-15       Perform Capillary Blood Glucose checks as needed.  Exercise Prescription Changes: Exercise Prescription Changes    Row Name 02/05/18 1200 02/13/18 1500 02/27/18 1400         Response to Exercise   Blood Pressure (Admit)  128/70  112/60  140/76     Blood Pressure (Exercise)  120/74  128/70  154/84     Blood Pressure (Exit)  124/70  108/60  124/74     Heart Rate (Admit)  66 bpm  88 bpm  81 bpm     Heart Rate (Exercise)  82 bpm  116 bpm  114 bpm     Heart Rate (Exit)  76 bpm  73 bpm  60 bpm     Oxygen Saturation (Admit)  98 %  -  -     Oxygen Saturation (Exercise)  98 %  -  -     Rating of Perceived Exertion (Exercise)  9  15  12  Symptoms  none  none  none     Comments  walk test results  second full day of exercise  -     Duration  -  Continue with 30 min of aerobic exercise without signs/symptoms of physical distress.  Continue with 30 min of aerobic exercise without signs/symptoms of physical distress.     Intensity  -  THRR unchanged  THRR unchanged       Progression   Progression  -  Continue to progress workloads  to maintain intensity without signs/symptoms of physical distress.  Continue to progress workloads to maintain intensity without signs/symptoms of physical distress.     Average METs  -  2.5  3       Resistance Training   Training Prescription  -  Yes  Yes     Weight  -  3 lbs  3 lbs     Reps  -  10-15  10-15       Interval Training   Interval Training  -  No  No       Treadmill   MPH  -  1.1  2.2     Grade  -  0.5  2.5     Minutes  -  15  15     METs  -  1.92  3.44       Recumbant Bike   Level  -  1  2     Watts  -  18  26     Minutes  -  15  15     METs  -  2.74  2.98       NuStep   Level  -  3  4     Minutes  -  15  15     METs  -  2.8  2.6        Exercise Comments: Exercise Comments    Row Name 02/08/18 0827           Exercise Comments  First full day of exercise!  Patient was oriented to gym and equipment including functions, settings, policies, and procedures.  Patient's individual exercise prescription and treatment plan were reviewed.  All starting workloads were established based on the results of the 6 minute walk test done at initial orientation visit.  The plan for exercise progression was also introduced and progression will be customized based on patient's performance and goals.          Exercise Goals and Review: Exercise Goals    Row Name 02/05/18 1439             Exercise Goals   Increase Physical Activity  Yes       Intervention  Provide advice, education, support and counseling about physical activity/exercise needs.;Develop an individualized exercise prescription for aerobic and resistive training based on initial evaluation findings, risk stratification, comorbidities and participant's personal goals.       Expected Outcomes  Short Term: Attend rehab on a regular basis to increase amount of physical activity.;Long Term: Add in home exercise to make exercise part of routine and to increase amount of physical activity.;Long Term: Exercising  regularly at least 3-5 days a week.       Increase Strength and Stamina  Yes       Intervention  Provide advice, education, support and counseling about physical activity/exercise needs.;Develop an individualized exercise prescription for aerobic and resistive training based on initial evaluation findings, risk stratification, comorbidities and  participant's personal goals.       Expected Outcomes  Short Term: Increase workloads from initial exercise prescription for resistance, speed, and METs.;Short Term: Perform resistance training exercises routinely during rehab and add in resistance training at home;Long Term: Improve cardiorespiratory fitness, muscular endurance and strength as measured by increased METs and functional capacity (6MWT)       Able to understand and use rate of perceived exertion (RPE) scale  Yes       Intervention  Provide education and explanation on how to use RPE scale       Expected Outcomes  Short Term: Able to use RPE daily in rehab to express subjective intensity level;Long Term:  Able to use RPE to guide intensity level when exercising independently       Knowledge and understanding of Target Heart Rate Range (THRR)  Yes       Intervention  Provide education and explanation of THRR including how the numbers were predicted and where they are located for reference       Expected Outcomes  Short Term: Able to state/look up THRR;Long Term: Able to use THRR to govern intensity when exercising independently;Short Term: Able to use daily as guideline for intensity in rehab       Able to check pulse independently  Yes       Intervention  Provide education and demonstration on how to check pulse in carotid and radial arteries.;Review the importance of being able to check your own pulse for safety during independent exercise       Expected Outcomes  Short Term: Able to explain why pulse checking is important during independent exercise;Long Term: Able to check pulse independently and  accurately       Understanding of Exercise Prescription  Yes       Intervention  Provide education, explanation, and written materials on patient's individual exercise prescription       Expected Outcomes  Short Term: Able to explain program exercise prescription;Long Term: Able to explain home exercise prescription to exercise independently          Exercise Goals Re-Evaluation : Exercise Goals Re-Evaluation    Barry [name] Name 02/08/18 0827 02/13/18 1557 02/20/18 0932 02/27/18 1436       Exercise Goal Re-Evaluation   Exercise Goals Review  Increase Physical Activity;Able to understand and use rate of perceived exertion (RPE) scale;Knowledge and understanding of Target Heart Rate Range (THRR);Understanding of Exercise Prescription  Increase Physical Activity;Understanding of Exercise Prescription;Increase Strength and Stamina  Increase Physical Activity;Understanding of Exercise Prescription;Increase Strength and Stamina  Increase Physical Activity;Understanding of Exercise Prescription;Increase Strength and Stamina    Comments  Reviewed RPE scale, THR and program prescription with pt today.  Pt voiced understanding and was given a copy of goals to take home.   Barry [name] is off to a good start in rehab.  We have had to reduce his walking speed on the treadmill as he was not able to maintain the 1.9 mph so he was acutally doing 1.1 mph today.  Otherwise, he has been able to tolerate the workloads well.  We will continue to monitor his progress.   Barry [name] is doing well in rehab. He is also walking at home for about 73mn 3-4x a week.  So far he is doing well with his walking on level ground.  He is starting to get back his strength and stamina.   Barry [name] been doing well in rehab.  He is now up to 26 watts on  the bike and 2.5% on the treadmill. We will continue to monitor his progression.     Expected Outcomes  Short: Use RPE daily to regulate intensity.  Long: Follow program prescription in THR.  Short: Continue  to creep speed up on treadmill.  Long: Continue to follow program prescription and attend regularly.   Short: Start adding in weights at home in addition to walking and go up on weights in class.  Long: Continue to walking on off days.   Short: Review home exercise guidelines.  Continue to encourage increasing his step rate on the NuStep.  Long: Continue to walk independently.        Discharge Exercise Prescription (Final Exercise Prescription Changes): Exercise Prescription Changes - 02/27/18 1400      Response to Exercise   Blood Pressure (Admit)  140/76    Blood Pressure (Exercise)  154/84    Blood Pressure (Exit)  124/74    Heart Rate (Admit)  81 bpm    Heart Rate (Exercise)  114 bpm    Heart Rate (Exit)  60 bpm    Rating of Perceived Exertion (Exercise)  12    Symptoms  none    Duration  Continue with 30 min of aerobic exercise without signs/symptoms of physical distress.    Intensity  THRR unchanged      Progression   Progression  Continue to progress workloads to maintain intensity without signs/symptoms of physical distress.    Average METs  3      Resistance Training   Training Prescription  Yes    Weight  3 lbs    Reps  10-15      Interval Training   Interval Training  No      Treadmill   MPH  2.2    Grade  2.5    Minutes  15    METs  3.44      Recumbant Bike   Level  2    Watts  26    Minutes  15    METs  2.98      NuStep   Level  4    Minutes  15    METs  2.6       Nutrition:  Target Goals: Understanding of nutrition guidelines, daily intake of sodium <1533m, cholesterol <2056m calories 30% from fat and 7% or less from saturated fats, daily to have 5 or more servings of fruits and vegetables.  Biometrics: Pre Biometrics - 02/05/18 1440      Pre Biometrics   Height  5' 8.75" (1.746 m)    Weight  180 lb (81.6 kg)    Waist Circumference  37 inches    Hip Circumference  38.5 inches    Waist to Hip Ratio  0.96 %    BMI (Calculated)  26.78     Single Leg Stand  2.79 seconds        Nutrition Therapy Plan and Nutrition Goals: Nutrition Therapy & Goals - 02/15/18 1142      Nutrition Therapy   Diet  TLC    Drug/Food Interactions  Statins/Certain Fruits    Fruits and Vegetables  8 servings/day    Sodium  1500 grams      Personal Nutrition Goals   Nutrition Goal  Continue with current healthy eating pattern    Comments  Barry [name] has been limiting sources of saturated fat, and limiting sodium intake; he and his wife report reading food labels. He eats vegetables and fruits regularly as  well as beans, nuts, and fish.        Nutrition Assessments: Nutrition Assessments - 02/05/18 1240      MEDFICTS Scores   Pre Score  34       Nutrition Goals Re-Evaluation: Nutrition Goals Re-Evaluation    Row Name 02/20/18 0937             Goals   Nutrition Goal  Continue with current healthy eating pattern       Comment  Barry [name] met with dietician last wekk.  He got some new materials and recipes. They continue to read their labels and watch their fat contents.  They enjoyed their appointment and it confirmed what they had already been doing.        Expected Outcome  Short: Continue to read labels. Long: Continue with heart healthy eating patteren.           Nutrition Goals Discharge (Final Nutrition Goals Re-Evaluation): Nutrition Goals Re-Evaluation - 02/20/18 0937      Goals   Nutrition Goal  Continue with current healthy eating pattern    Comment  Barry [name] met with dietician last wekk.  He got some new materials and recipes. They continue to read their labels and watch their fat contents.  They enjoyed their appointment and it confirmed what they had already been doing.     Expected Outcome  Short: Continue to read labels. Long: Continue with heart healthy eating patteren.        Psychosocial: Target Goals: Acknowledge presence or absence of significant depression and/or stress, maximize coping skills, provide positive  support system. Participant is able to verbalize types and ability to use techniques and skills needed for reducing stress and depression.   Initial Review & Psychosocial Screening: Initial Psych Review & Screening - 02/05/18 1251      Initial Review   Current issues with  None Identified      Family Dynamics   Good Support System?  Yes Spouse, family, friends, pastor      Barriers   Psychosocial barriers to participate in program  There are no identifiable barriers or psychosocial needs.;The patient should benefit from training in stress management and relaxation.      Screening Interventions   Interventions  Encouraged to exercise;Provide feedback about the scores to participant;To provide support and resources with identified psychosocial needs    Expected Outcomes  Short Term goal: Utilizing psychosocial counselor, staff and physician to assist with identification of specific Stressors or current issues interfering with healing process. Setting desired goal for each stressor or current issue identified.;Long Term Goal: Stressors or current issues are controlled or eliminated.;Short Term goal: Identification and review with participant of any Quality of Life or Depression concerns found by scoring the questionnaire.;Long Term goal: The participant improves quality of Life and PHQ9 Scores as seen by post scores and/or verbalization of changes       Quality of Life Scores:  Quality of Life - 02/05/18 1252      Quality of Life Scores   Health/Function Pre  26.18 %    Socioeconomic Pre  30 %    Psych/Spiritual Pre  30 %    Family Pre  30 %    GLOBAL Pre  28.5 %      Scores of 19 and below usually indicate a poorer quality of life in these areas.  A difference of  2-3 points is a clinically meaningful difference.  A difference of 2-3 points in the total score of  the Quality of Life Index has been associated with significant improvement in overall quality of life, self-image, physical  symptoms, and general health in studies assessing change in quality of life.  PHQ-9: Recent Review Flowsheet Data    Depression screen [facility] 2/9 02/05/2018 01/01/2018 06/27/2017 08/09/2016 06/06/2016   Decreased Interest 0 0 0 1 0   Down, Depressed, Hopeless 0 0 0 0 0   PHQ - 2 Score 0 0 0 1 0   Altered sleeping 0 - - - -   Tired, decreased energy 0 - - - -   Change in appetite 0 - - - -   Feeling bad or failure about yourself  0 - - - -   Trouble concentrating 0 - - - -   Moving slowly or fidgety/restless 0 - - - -   Suicidal thoughts 0 - - - -   PHQ-9 Score 0 - - - -   Difficult doing work/chores Not difficult at all - - - -     Interpretation of Total Score  Total Score Depression Severity:  1-4 = Minimal depression, 5-9 = Mild depression, 10-14 = Moderate depression, 15-19 = Moderately severe depression, 20-27 = Severe depression   Psychosocial Evaluation and Intervention: Psychosocial Evaluation - 02/08/18 0934      Psychosocial Evaluation & Interventions   Interventions  Encouraged to exercise with the program and follow exercise prescription    Comments  Counselor met with Barry [name]) today for initial psychosocial evaluation.  He is a 72 year old who had a heart attack and a stent on 4/2.  He has a strong support system with a spouse & daughter who lives in the home and he has a brother and good friend who also live locally.  Barry [name] was diagnosed with pre-diabetes when he was in the hospital so he is working to bring those numbers down with diet and exercise.  Markanthony sleeps well and has a good appetite.  He denies a history of depression or anxiety or any current symptoms and is typically in a positive mood most of the time.  Barry [name] has minimal stress in his life other than his health.  Barry [name] has goals to get back to his normal activities and decrease his blood sugar levels.  Staff will follow with him.     Expected Outcomes  Short:  Barry [name] will change his lifestyle to eat more  healthy and exercise to decrease his blood sugar levels.  Long:  Barry [name] will exercise consistently to get his heart healthier and increase his stamina and strength.    Continue Psychosocial Services   Follow up required by staff       Psychosocial Re-Evaluation: Psychosocial Re-Evaluation    [place] Name 02/20/18 [id number]             Psychosocial Re-Evaluation   Current issues with  Current Stress Concerns;None Identified       Comments  Barry [name] is doing well in rehab. He remains postive overall.  He has found the biggest benefit has been regaining his strength which has let him be able to do more at home.  He continues to sleep well.        Expected Outcomes  Short: Continue to attend rehab regularly. Long: Continue to remain postive.       Interventions  Stress management education;Encouraged to attend Cardiac Rehabilitation for the exercise       Continue Psychosocial Services   Follow up required by staff  Psychosocial Discharge (Final Psychosocial Re-Evaluation): Psychosocial Re-Evaluation - 02/20/18 7654      Psychosocial Re-Evaluation   Current issues with  Current Stress Concerns;None Identified    Comments  Barry [name] is doing well in rehab. He remains postive overall.  He has found the biggest benefit has been regaining his strength which has let him be able to do more at home.  He continues to sleep well.     Expected Outcomes  Short: Continue to attend rehab regularly. Long: Continue to remain postive.    Interventions  Stress management education;Encouraged to attend Cardiac Rehabilitation for the exercise    Continue Psychosocial Services   Follow up required by staff       Vocational Rehabilitation: Provide vocational rehab assistance to qualifying candidates.   Vocational Rehab Evaluation & Intervention: Vocational Rehab - 02/05/18 1254      Initial Vocational Rehab Evaluation & Intervention   Assessment shows need for Vocational Rehabilitation  No        Education: Education Goals: Education classes will be provided on a variety of topics geared toward better understanding of heart health and risk factor modification. Participant will state understanding/return demonstration of topics presented as noted by education test scores.  Learning Barriers/Preferences: Learning Barriers/Preferences - 02/05/18 1253      Learning Barriers/Preferences   Learning Barriers  Exercise Concerns    Learning Preferences  Verbal Instruction;Written Material       Education Topics:  AED/CPR: - Group verbal and written instruction with the use of models to demonstrate the basic use of the AED with the basic ABC's of resuscitation.   General Nutrition Guidelines/Fats and Fiber: -Group instruction provided by verbal, written material, models and posters to present the general guidelines for heart healthy nutrition. Gives an explanation and review of dietary fats and fiber.   Controlling Sodium/Reading Food Labels: -Group verbal and written material supporting the discussion of sodium use in heart healthy nutrition. Review and explanation with models, verbal and written materials for utilization of the food label.   Cardiac Rehab from 03/06/2018 in [facility] Cardiac and Pulmonary Rehab  Date  03/06/18  Educator  PI  Instruction Review Code  1- Verbalizes Understanding      Exercise Physiology & General Exercise Guidelines: - Group verbal and written instruction with models to review the exercise physiology of the cardiovascular system and associated critical values. Provides general exercise guidelines with specific guidelines to those with heart or lung disease.    Aerobic Exercise & Resistance Training: - Gives group verbal and written instruction on the various components of exercise. Focuses on aerobic and resistive training programs and the benefits of this training and how to safely progress through these programs..   Flexibility, Balance,  Mind/Body Relaxation: Provides group verbal/written instruction on the benefits of flexibility and balance training, including mind/body exercise modes such as yoga, pilates and tai chi.  Demonstration and skill practice provided.   Stress and Anxiety: - Provides group verbal and written instruction about the health risks of elevated stress and causes of high stress.  Discuss the correlation between heart/lung disease and anxiety and treatment options. Review healthy ways to manage with stress and anxiety.   Cardiac Rehab from 03/06/2018 in [facility] Cardiac and Pulmonary Rehab  Date  02/13/18  Educator  [facility]  Instruction Review Code  1- Verbalizes Understanding      Depression: - Provides group verbal and written instruction on the correlation between heart/lung disease and depressed mood, treatment options, and the stigmas  associated with seeking treatment.   Anatomy & Physiology of the Heart: - Group verbal and written instruction and models provide basic cardiac anatomy and physiology, with the coronary electrical and arterial systems. Review of Valvular disease and Heart Failure   Cardiac Rehab from 03/06/2018 in [facility] Cardiac and Pulmonary Rehab  Date  02/15/18  Educator  CE  Instruction Review Code  1- Verbalizes Understanding      Cardiac Procedures: - Group verbal and written instruction to review commonly prescribed medications for heart disease. Reviews the medication, class of the drug, and side effects. Includes the steps to properly store meds and maintain the prescription regimen. (beta blockers and nitrates)   Cardiac Rehab from 03/06/2018 in [facility] Cardiac and Pulmonary Rehab  Date  03/01/18  Educator  CE  Instruction Review Code  1- Verbalizes Understanding      Cardiac Medications I: - Group verbal and written instruction to review commonly prescribed medications for heart disease. Reviews the medication, class of the drug, and side effects. Includes the steps to properly  store meds and maintain the prescription regimen.   Cardiac Rehab from 03/06/2018 in [facility] Cardiac and Pulmonary Rehab  Date  02/20/18  Educator  SB  Instruction Review Code  1- Verbalizes Understanding      Cardiac Medications II: -Group verbal and written instruction to review commonly prescribed medications for heart disease. Reviews the medication, class of the drug, and side effects. (all other drug classes)   Cardiac Rehab from 03/06/2018 in [facility] Cardiac and Pulmonary Rehab  Date  02/08/18  Educator  CE  Instruction Review Code  1- Verbalizes Understanding       Go Sex-Intimacy & Heart Disease, Get SMART - Goal Setting: - Group verbal and written instruction through game format to discuss heart disease and the return to sexual intimacy. Provides group verbal and written material to discuss and apply goal setting through the application of the S.M.A.R.T. Method.   Cardiac Rehab from 03/06/2018 in [facility] Cardiac and Pulmonary Rehab  Date  03/01/18  Educator  CE  Instruction Review Code  1- Verbalizes Understanding      Other Matters of the Heart: - Provides group verbal, written materials and models to describe Stable Angina and Peripheral Artery. Includes description of the disease process and treatment options available to the cardiac patient.   Exercise & Equipment Safety: - Individual verbal instruction and demonstration of equipment use and safety with use of the equipment.   Cardiac Rehab from 03/06/2018 in [facility] Cardiac and Pulmonary Rehab  Date  02/05/18  Educator  [facility]  Instruction Review Code  1- Verbalizes Understanding      Infection Prevention: - Provides verbal and written material to individual with discussion of infection control including proper hand washing and proper equipment cleaning during exercise session.   Cardiac Rehab from 03/06/2018 in [facility] Cardiac and Pulmonary Rehab  Date  02/05/18  Educator  [facility]  Instruction Review Code  1- Verbalizes Understanding       Falls Prevention: - Provides verbal and written material to individual with discussion of falls prevention and safety.   Cardiac Rehab from 03/06/2018 in [facility] Cardiac and Pulmonary Rehab  Date  02/05/18  Educator  [facility]  Instruction Review Code  1- Verbalizes Understanding      Diabetes: - Individual verbal and written instruction to review signs/symptoms of diabetes, desired ranges of glucose level fasting, after meals and with exercise. Acknowledge that pre and post exercise glucose checks will be done for  3 sessions at entry of program.   Know Your Numbers and Risk Factors: -Group verbal and written instruction about important numbers in your health.  Discussion of what are risk factors and how they play a role in the disease process.  Review of Cholesterol, Blood Pressure, Diabetes, and BMI and the role they play in your overall health.   Cardiac Rehab from 03/06/2018 in [facility] Cardiac and Pulmonary Rehab  Date  02/08/18  Educator  CE  Instruction Review Code  1- Verbalizes Understanding      Sleep Hygiene: -Provides group verbal and written instruction about how sleep can affect your health.  Define sleep hygiene, discuss sleep cycles and impact of sleep habits. Review good sleep hygiene tips.    Cardiac Rehab from 03/06/2018 in [facility] Cardiac and Pulmonary Rehab  Date  02/27/18  Educator  [facility]  Instruction Review Code  1- Verbalizes Understanding      Other: -Provides group and verbal instruction on various topics (see comments)   Knowledge Questionnaire Score: Knowledge Questionnaire Score - 02/05/18 1253      Knowledge Questionnaire Score   Pre Score  23/28 reviewed correct responses with Barry [name] and he verbalized understanding of the responses. No further questions were asked today       Core Components/Risk Factors/Patient Goals at Admission: Personal Goals and Risk Factors at Admission - 02/05/18 1240      Core Components/Risk Factors/Patient Goals on Admission     Weight Management  Yes;Weight Loss    Intervention  Weight Management: Develop a combined nutrition and exercise program designed to reach desired caloric intake, while maintaining appropriate intake of nutrient and fiber, sodium and fats, and appropriate energy expenditure required for the weight goal.;Weight Management: Provide education and appropriate resources to help participant work on and attain dietary goals.    Admit Weight  180 lb (81.6 kg)    Goal Weight: Short Term  178 lb (80.7 kg)    Goal Weight: Long Term  175 lb (79.4 kg)    Expected Outcomes  Short Term: Continue to assess and modify interventions until short term weight is achieved;Long Term: Adherence to nutrition and physical activity/exercise program aimed toward attainment of established weight goal;Weight Loss: Understanding of general recommendations for a balanced deficit meal plan, which promotes 1-2 lb weight loss per week and includes a negative energy balance of [phone] kcal/d;Understanding recommendations for meals to include 15-35% energy as protein, 25-35% energy from fat, 35-60% energy from carbohydrates, less than 278m of dietary cholesterol, 20-35 gm of total fiber daily;Understanding of distribution of calorie intake throughout the day with the consumption of 4-5 meals/snacks BMI is 26.   Would advise small weight loss if possible.  Exercise and nutrition changes may make this happen    Diabetes  Yes IS being watched by MD for DIabetes.  A1C has been close to Diabetes diagnosis numbers.  Blood work is scheduled for followp in a few months.  Will work on the exercise and nutrition while in program    Intervention  Provide education about signs/symptoms and action to take for hypo/hyperglycemia.;Provide education about proper nutrition, including hydration, and aerobic/resistive exercise prescription along with prescribed medications to achieve blood glucose in normal ranges: Fasting glucose 65-99 mg/dL    Expected  Outcomes  Short Term: Participant verbalizes understanding of the signs/symptoms and immediate care of hyper/hypoglycemia, proper foot care and importance of medication, aerobic/resistive exercise and nutrition plan for blood glucose control.;Long Term: Attainment of HbA1C < 7%.  Hypertension  Yes    Intervention  Provide education on lifestyle modifcations including regular physical activity/exercise, weight management, moderate sodium restriction and increased consumption of fresh fruit, vegetables, and low fat dairy, alcohol moderation, and smoking cessation.;Monitor prescription use compliance.    Expected Outcomes  Short Term: Continued assessment and intervention until BP is < 140/56m HG in hypertensive participants. < 130/852mHG in hypertensive participants with diabetes, heart failure or chronic kidney disease.;Long Term: Maintenance of blood pressure at goal levels.    Lipids  Yes    Intervention  Provide education and support for participant on nutrition & aerobic/resistive exercise along with prescribed medications to achieve LDL <7047mHDL >79m63m  Expected Outcomes  Short Term: Participant states understanding of desired cholesterol values and is compliant with medications prescribed. Participant is following exercise prescription and nutrition guidelines.;Long Term: Cholesterol controlled with medications as prescribed, with individualized exercise RX and with personalized nutrition plan. Value goals: LDL < 70mg47mL > 40 mg.       Core Components/Risk Factors/Patient Goals Review:  Goals and Risk Factor Review    Row Name 02/20/18 0934             Core Components/Risk Factors/Patient Goals Review   Personal Goals Review  Weight Management/Obesity;Hypertension;Lipids       Review  Barry [name] doing well in rehab, He is down to 179 lbs today and working his way towards his goal of 175lbs.  He has been watching his diet.  His blood pressures have been good in class and he  checks it at home three times a week. He is doing well with his medications.        Expected Outcomes  Short: Continue to work toward weight loss goal.  Long: Continue to monitor blood pressures.           Core Components/Risk Factors/Patient Goals at Discharge (Final Review):  Goals and Risk Factor Review - 02/20/18 0934      Core Components/Risk Factors/Patient Goals Review   Personal Goals Review  Weight Management/Obesity;Hypertension;Lipids    Review  Barry [name] doing well in rehab, He is down to 179 lbs today and working his way towards his goal of 175lbs.  He has been watching his diet.  His blood pressures have been good in class and he checks it at home three times a week. He is doing well with his medications.     Expected Outcomes  Short: Continue to work toward weight loss goal.  Long: Continue to monitor blood pressures.        ITP Comments: ITP Comments    Row Name 02/05/18 1236 02/07/18 0601 03/07/18 0612       ITP Comments  Medical review completed. INitial ITP sent to Dr M MilLoleta Chancereview, changes as needed and signature. [facility]  30 day review. Continue with ITP unless directed changes per Medical Director.    new to  program  30 day review. Continue with ITP unless directed changes per Medical Director        Comments:

## 2018-03-08 ENCOUNTER — Encounter: Payer: PPO | Admitting: *Deleted

## 2018-03-08 DIAGNOSIS — I213 ST elevation (STEMI) myocardial infarction of unspecified site: Secondary | ICD-10-CM

## 2018-03-08 DIAGNOSIS — Z955 Presence of coronary angioplasty implant and graft: Secondary | ICD-10-CM

## 2018-03-08 DIAGNOSIS — Z48812 Encounter for surgical aftercare following surgery on the circulatory system: Secondary | ICD-10-CM | POA: Diagnosis not present

## 2018-03-08 NOTE — Progress Notes (Signed)
Daily Session Note  Patient Details  Name: Barry [name] MRN: [id number] Date of Birth: August 07, 1946 Referring Provider:     Cardiac Rehab from 02/05/2018 in [facility] Cardiac and Pulmonary Rehab  Referring Provider  End, Harrell Gave MD      Encounter Date: 03/08/2018  Check In: Session Check In - 03/08/18 0836      Check-In   Location  [facility]-Cardiac & Pulmonary Rehab    Staff Present  Gerlene Burdock, RN, BSN;Meredith Sherryll Burger, RN BSN;Zyaira Vejar Luan Pulling, MA, RCEP, CCRP, Exercise Physiologist    Supervising physician immediately available to respond to emergencies  See telemetry face sheet for immediately available ER MD    Medication changes reported      No    Fall or balance concerns reported     No    Warm-up and Cool-down  Performed on first and last piece of equipment    Resistance Training Performed  Yes    VAD Patient?  No      Pain Assessment   Currently in Pain?  No/denies          Social History   Tobacco Use  Smoking Status Never Smoker  Smokeless Tobacco Former Systems developer  . Types: Chew    Goals Met:  Independence with exercise equipment Exercise tolerated well No report of cardiac concerns or symptoms Strength training completed today  Goals Unmet:  Not Applicable  Comments: Pt able to follow exercise prescription today without complaint.  Will continue to monitor for progression. Reviewed home exercise with pt today.  Pt plans to walk at home for exercise.  Reviewed THR, pulse, RPE, sign and symptoms, and when to call 911 or MD.  Also discussed weather considerations and indoor options.  Pt voiced understanding.   Dr. Emily Filbert is Medical Director for [place] and [name].

## 2018-03-13 DIAGNOSIS — Z48812 Encounter for surgical aftercare following surgery on the circulatory system: Secondary | ICD-10-CM | POA: Diagnosis not present

## 2018-03-13 DIAGNOSIS — I213 ST elevation (STEMI) myocardial infarction of unspecified site: Secondary | ICD-10-CM

## 2018-03-13 DIAGNOSIS — Z955 Presence of coronary angioplasty implant and graft: Secondary | ICD-10-CM

## 2018-03-13 NOTE — Progress Notes (Signed)
Daily Session Note  Patient Details  Name: Barry [name] MRN: [id number] Date of Birth: 10-22-1945 Referring Provider:     Cardiac Rehab from 02/05/2018 in [facility] Cardiac and Pulmonary Rehab  Referring Provider  End, Harrell Gave MD      Encounter Date: 03/13/2018  Check In: Session Check In - 03/13/18 0834      Check-In   Location  [facility]-Cardiac & Pulmonary Rehab    Staff Present  Heath Lark, RN, BSN, CCRP;Jacqulyne Gladue Luan Pulling, MA, RCEP, CCRP, Exercise Physiologist;Amanda Oletta Darter, [place], ACSM CEP, Exercise Physiologist    Supervising physician immediately available to respond to emergencies  See telemetry face sheet for immediately available ER MD    Medication changes reported      No    Fall or balance concerns reported     No    Warm-up and Cool-down  Performed on first and last piece of equipment    Resistance Training Performed  Yes    VAD Patient?  No      Pain Assessment   Currently in Pain?  No/denies    Multiple Pain Sites  No          Social History   Tobacco Use  Smoking Status Never Smoker  Smokeless Tobacco Former Systems developer  . Types: Chew    Goals Met:  Independence with exercise equipment Exercise tolerated well No report of cardiac concerns or symptoms Strength training completed today  Goals Unmet:  Not Applicable  Comments: Pt able to follow exercise prescription today without complaint.  Will continue to monitor for progression.    Dr. Emily Filbert is Medical Director for [name] and [name].

## 2018-03-15 DIAGNOSIS — Z955 Presence of coronary angioplasty implant and graft: Secondary | ICD-10-CM

## 2018-03-15 DIAGNOSIS — I213 ST elevation (STEMI) myocardial infarction of unspecified site: Secondary | ICD-10-CM

## 2018-03-15 DIAGNOSIS — Z48812 Encounter for surgical aftercare following surgery on the circulatory system: Secondary | ICD-10-CM | POA: Diagnosis not present

## 2018-03-15 NOTE — Progress Notes (Signed)
Daily Session Note  Patient Details  Name: Barry [name] MRN: [id number] Date of Birth: December 08, 1945 Referring Provider:     Cardiac Rehab from 02/05/2018 in [facility] Cardiac and Pulmonary Rehab  Referring Provider  End, Harrell Gave MD      Encounter Date: 03/15/2018  Check In: Session Check In - 03/15/18 0852      Check-In   Location  [facility]-Cardiac & Pulmonary Rehab    Staff Present  Gerlene Burdock, RN, BSN;Jessica Luan Pulling, MA, RCEP, CCRP, Exercise Physiologist;[place] Oletta Darter, [place], ACSM CEP, Exercise Physiologist    Supervising physician immediately available to respond to emergencies  See telemetry face sheet for immediately available ER MD    Medication changes reported      No    Fall or balance concerns reported     No    Warm-up and Cool-down  Performed on first and last piece of equipment    Resistance Training Performed  Yes    VAD Patient?  No      Pain Assessment   Currently in Pain?  No/denies    Multiple Pain Sites  No          Social History   Tobacco Use  Smoking Status Never Smoker  Smokeless Tobacco Former Systems developer  . Types: Chew    Goals Met:  Independence with exercise equipment Exercise tolerated well No report of cardiac concerns or symptoms Strength training completed today  Goals Unmet:  Not Applicable  Comments: Pt able to follow exercise prescription today without complaint.  Will continue to monitor for progression.    Dr. Emily Filbert is Medical Director for [place] and [name].

## 2018-03-20 ENCOUNTER — Encounter: Payer: PPO | Attending: Internal Medicine

## 2018-03-20 DIAGNOSIS — I213 ST elevation (STEMI) myocardial infarction of unspecified site: Secondary | ICD-10-CM | POA: Insufficient documentation

## 2018-03-20 DIAGNOSIS — Z48812 Encounter for surgical aftercare following surgery on the circulatory system: Secondary | ICD-10-CM | POA: Insufficient documentation

## 2018-03-20 DIAGNOSIS — Z955 Presence of coronary angioplasty implant and graft: Secondary | ICD-10-CM | POA: Diagnosis not present

## 2018-03-20 NOTE — Progress Notes (Signed)
Daily Session Note  Patient Details  Name: Barry [name] MRN: [id number] Date of Birth: Jul 07, 1946 Referring Provider:     Cardiac Rehab from 02/05/2018 in [facility] Cardiac and Pulmonary Rehab  Referring Provider  End, Harrell Gave MD      Encounter Date: 03/20/2018  Check In: Session Check In - 03/20/18 5271      Check-In   Location  [facility]-Cardiac & Pulmonary Rehab    Staff Present  Justin Mend RCP,RRT,BSRT;Heath Lark, RN, BSN, Lance Sell, BA, ACSM CEP, Exercise Physiologist    Supervising physician immediately available to respond to emergencies  See telemetry face sheet for immediately available ER MD    Medication changes reported      No    Fall or balance concerns reported     No    Tobacco Cessation  No Change    Warm-up and Cool-down  Performed on first and last piece of equipment    Resistance Training Performed  Yes    VAD Patient?  No      Pain Assessment   Currently in Pain?  No/denies          Social History   Tobacco Use  Smoking Status Never Smoker  Smokeless Tobacco Former Systems developer  . Types: Chew    Goals Met:  Independence with exercise equipment Exercise tolerated well No report of cardiac concerns or symptoms Strength training completed today  Goals Unmet:  Not Applicable  Comments: Pt able to follow exercise prescription today without complaint.  Will continue to monitor for progression.   Dr. Emily Filbert is Medical Director for [place] and [name].

## 2018-03-22 DIAGNOSIS — Z48812 Encounter for surgical aftercare following surgery on the circulatory system: Secondary | ICD-10-CM | POA: Diagnosis not present

## 2018-03-22 DIAGNOSIS — Z955 Presence of coronary angioplasty implant and graft: Secondary | ICD-10-CM

## 2018-03-22 DIAGNOSIS — I213 ST elevation (STEMI) myocardial infarction of unspecified site: Secondary | ICD-10-CM

## 2018-03-22 NOTE — Progress Notes (Signed)
Daily Session Note  Patient Details  Name: Barry [name] MRN: [id number] Date of Birth: December 05, 1945 Referring Provider:     Cardiac Rehab from 02/05/2018 in [facility] Cardiac and Pulmonary Rehab  Referring Provider  End, Harrell Gave MD      Encounter Date: 03/22/2018  Check In: Session Check In - 03/22/18 1021      Check-In   Location  [facility]-Cardiac & Pulmonary Rehab    Staff Present  Joellyn Rued, BS, PEC;Nana Addai, RN BSN;Susanne Bice, RN, BSN, [employer], BA, ACSM CEP, Exercise Physiologist    Supervising physician immediately available to respond to emergencies  See telemetry face sheet for immediately available ER MD    Medication changes reported      No    Fall or balance concerns reported     No    Warm-up and Cool-down  Performed on first and last piece of equipment    Resistance Training Performed  Yes    VAD Patient?  No      Pain Assessment   Currently in Pain?  No/denies    Multiple Pain Sites  No          Social History   Tobacco Use  Smoking Status Never Smoker  Smokeless Tobacco Former Systems developer  . Types: Chew    Goals Met:  Independence with exercise equipment Exercise tolerated well No report of cardiac concerns or symptoms Strength training completed today  Goals Unmet:  Not Applicable  Comments: Pt able to follow exercise prescription today without complaint.  Will continue to monitor for progression.    Dr. Emily Filbert is Medical Director for [place] and [name].

## 2018-03-27 ENCOUNTER — Encounter: Payer: PPO | Admitting: *Deleted

## 2018-03-27 VITALS — Ht 68.75 in | Wt 177.0 lb

## 2018-03-27 DIAGNOSIS — Z955 Presence of coronary angioplasty implant and graft: Secondary | ICD-10-CM

## 2018-03-27 DIAGNOSIS — Z48812 Encounter for surgical aftercare following surgery on the circulatory system: Secondary | ICD-10-CM | POA: Diagnosis not present

## 2018-03-27 DIAGNOSIS — I213 ST elevation (STEMI) myocardial infarction of unspecified site: Secondary | ICD-10-CM

## 2018-03-27 NOTE — Progress Notes (Signed)
Daily Session Note  Patient Details  Name: Barry [name] MRN: [id number] Date of Birth: 1946/03/18 Referring Provider:     Cardiac Rehab from 02/05/2018 in [facility] Cardiac and Pulmonary Rehab  Referring Provider  End, Harrell Gave MD      Encounter Date: 03/27/2018  Check In: Session Check In - 03/27/18 0914      Check-In   Location  [facility]-Cardiac & Pulmonary Rehab    Staff Present  Joellyn Rued, BS, PEC;Susanne Bice, RN, BSN, CCRP;Gavan Nordby [place], MA, RCEP, CCRP, Exercise Physiologist;Amanda Oletta Darter, [place], ACSM CEP, Exercise Physiologist    Supervising physician immediately available to respond to emergencies  See telemetry face sheet for immediately available ER MD    Medication changes reported      No    Fall or balance concerns reported     No    Warm-up and Cool-down  Performed on first and last piece of equipment    Resistance Training Performed  Yes    VAD Patient?  No      Pain Assessment   Currently in Pain?  No/denies          Social History   Tobacco Use  Smoking Status Never Smoker  Smokeless Tobacco Former Systems developer  . Types: Chew    Goals Met:  Independence with exercise equipment Exercise tolerated well Personal goals reviewed No report of cardiac concerns or symptoms Strength training completed today  Goals Unmet:  Not Applicable  Comments: Pt able to follow exercise prescription today without complaint.  Will continue to monitor for progression. See ITP for goal review 6 Minute Walk    Row Name 02/05/18 1433 03/27/18 0854       6 Minute Walk   Phase  Initial  Discharge    Distance  1035 feet  1220 feet    Walk Time  6 minutes  6 minutes    # of Rest Breaks  0  0    MPH  1.96  2.31    METS  2.21  2.85    RPE  9  12    Perceived Dyspnea   -  1    VO2 Peak  7.75  9.97    Symptoms  No  Yes (comment)    Comments  -  R knee pain 6/10     Resting HR  66 bpm  85 bpm    Resting BP  128/70  132/68    Resting Oxygen Saturation   98 %  98 %    Exercise  Oxygen Saturation  during 6 min walk  98 %  98 %    Max Ex. HR  82 bpm  98 bpm    Max Ex. BP  120/74  140/70    2 Minute Post BP  124/70  128/68        Dr. Emily Filbert is Medical Director for [place] and [name].

## 2018-03-29 DIAGNOSIS — I213 ST elevation (STEMI) myocardial infarction of unspecified site: Secondary | ICD-10-CM

## 2018-03-29 DIAGNOSIS — Z48812 Encounter for surgical aftercare following surgery on the circulatory system: Secondary | ICD-10-CM | POA: Diagnosis not present

## 2018-03-29 DIAGNOSIS — Z955 Presence of coronary angioplasty implant and graft: Secondary | ICD-10-CM

## 2018-03-29 NOTE — Progress Notes (Signed)
Daily Session Note  Patient Details  Name: Barry [name] MRN: [id number] Date of Birth: 1946-06-03 Referring Provider:     Cardiac Rehab from 02/05/2018 in [facility] Cardiac and Pulmonary Rehab  Referring Provider  End, Harrell Gave MD      Encounter Date: 03/29/2018  Check In: Session Check In - 03/29/18 0845      Check-In   Location  [facility]-Cardiac & Pulmonary Rehab    Staff Present  Gerlene Burdock, RN, BSN;Jessica Luan Pulling, MA, RCEP, CCRP, Exercise Physiologist;Amanda Oletta Darter, [place], ACSM CEP, Exercise Physiologist    Supervising physician immediately available to respond to emergencies  See telemetry face sheet for immediately available ER MD    Medication changes reported      No    Fall or balance concerns reported     No    Warm-up and Cool-down  Performed on first and last piece of equipment    Resistance Training Performed  Yes    VAD Patient?  No      Pain Assessment   Currently in Pain?  No/denies          Social History   Tobacco Use  Smoking Status Never Smoker  Smokeless Tobacco Former Systems developer  . Types: Chew    Goals Met:  Independence with exercise equipment Exercise tolerated well No report of cardiac concerns or symptoms Strength training completed today  Goals Unmet:  Not Applicable  Comments: Pt able to follow exercise prescription today without complaint.  Will continue to monitor for progression. Moved off monitor today   Dr. Emily Filbert is Medical Director for [place] and [name].

## 2018-04-03 DIAGNOSIS — Z955 Presence of coronary angioplasty implant and graft: Secondary | ICD-10-CM

## 2018-04-03 DIAGNOSIS — I213 ST elevation (STEMI) myocardial infarction of unspecified site: Secondary | ICD-10-CM

## 2018-04-03 DIAGNOSIS — Z48812 Encounter for surgical aftercare following surgery on the circulatory system: Secondary | ICD-10-CM | POA: Diagnosis not present

## 2018-04-03 NOTE — Progress Notes (Signed)
Daily Session Note  Patient Details  Name: Barry [name] MRN: [id number] Date of Birth: [birth year] Referring Provider:     Cardiac Rehab from 02/05/2018 in [facility] Cardiac and Pulmonary Rehab  Referring Provider  End, Harrell Gave MD      Encounter Date: 04/03/2018  Check In: Session Check In - 04/03/18 0838      Check-In   Location  [facility]-Cardiac & Pulmonary Rehab    Staff Present  Heath Lark, RN, BSN, CCRP;Jessica Luan Pulling, MA, RCEP, CCRP, Exercise Physiologist;Amanda Oletta Darter, [place], ACSM CEP, Exercise Physiologist    Supervising physician immediately available to respond to emergencies  See telemetry face sheet for immediately available ER MD    Medication changes reported      No    Fall or balance concerns reported     No    Warm-up and Cool-down  Performed on first and last piece of equipment    Resistance Training Performed  Yes    VAD Patient?  No      Pain Assessment   Currently in Pain?  No/denies    Multiple Pain Sites  No          Social History   Tobacco Use  Smoking Status Never Smoker  Smokeless Tobacco Former Systems developer  . Types: Chew    Goals Met:  Independence with exercise equipment Exercise tolerated well No report of cardiac concerns or symptoms Strength training completed today  Goals Unmet:  Not Applicable  Comments: Pt able to follow exercise prescription today without complaint.  Will continue to monitor for progression.    Dr. Emily Filbert is Medical Director for [place] and [name].

## 2018-04-04 ENCOUNTER — Encounter: Payer: Self-pay | Admitting: *Deleted

## 2018-04-04 DIAGNOSIS — Z955 Presence of coronary angioplasty implant and graft: Secondary | ICD-10-CM

## 2018-04-04 DIAGNOSIS — I213 ST elevation (STEMI) myocardial infarction of unspecified site: Secondary | ICD-10-CM

## 2018-04-04 NOTE — Progress Notes (Signed)
Cardiac Individual Treatment Plan  Patient Details  Name: Barry [name] MRN: [id number] Date of Birth: 02-Sep-1946 Referring Provider:     Cardiac Rehab from 02/05/2018 in [facility] Cardiac and Pulmonary Rehab  Referring Provider  End, Harrell Gave MD      Initial Encounter Date:    Cardiac Rehab from 02/05/2018 in [facility] Cardiac and Pulmonary Rehab  Date  02/05/18  Referring Provider  End, Harrell Gave MD      Visit Diagnosis: ST elevation myocardial infarction (STEMI), unspecified artery [facility])  Status post coronary artery stent placement  Patient's Home Medications on Admission:  Current Outpatient Medications:  .  aspirin 81 MG tablet, Take 81 mg by mouth 2 (two) times daily., Disp: , Rfl:  .  atorvastatin (LIPITOR) 80 MG tablet, Take 1 tablet (80 mg total) by mouth daily at 6 PM., Disp: 90 tablet, Rfl: 3 .  carvedilol (COREG) 6.25 MG tablet, Take 1 tablet (6.25 mg total) by mouth 2 (two) times daily., Disp: 180 tablet, Rfl: 3 .  loratadine (CLARITIN) 10 MG tablet, Take 1 tablet (10 mg total) by mouth as needed., Disp: , Rfl:  .  losartan (COZAAR) 25 MG tablet, Take 1 tablet (25 mg total) by mouth daily., Disp: 90 tablet, Rfl: 3 .  Multiple Vitamin (MULTIVITAMIN WITH MINERALS) TABS tablet, Take 1 tablet by mouth every other day., Disp: , Rfl:  .  ranitidine (ZANTAC) 150 MG capsule, Take 150 mg by mouth daily as needed. , Disp: , Rfl:  .  tamsulosin (FLOMAX) 0.4 MG CAPS capsule, Take 1 capsule (0.4 mg total) by mouth daily., Disp: 30 capsule, Rfl: 5 .  ticagrelor (BRILINTA) 90 MG TABS tablet, Take 1 tablet (90 mg total) by mouth 2 (two) times daily., Disp: 180 tablet, Rfl: 3  Past Medical History: Past Medical History:  Diagnosis Date  . Arthritis   . BPH (benign prostatic hypertrophy)   . GERD (gastroesophageal reflux disease)   . Hyperlipidemia   . Hypertension     Tobacco Use: Social History   Tobacco Use  Smoking Status Never Smoker  Smokeless Tobacco Former Systems developer  .  Types: Chew    Labs: Recent Review Flowsheet Data    Labs for ITP Cardiac and Pulmonary Rehab Latest Ref Rng & Units 06/15/2016 11/14/2016 06/27/2017 12/25/2017 01/16/2018   Cholestrol 0 - 200 mg/dL 157 - 126 - 142   LDLCALC 0 - 99 mg/dL 90 - 69 - 77   HDL >40 mg/dL 44 - 40(L) - 39(L)   Trlycerides <150 mg/dL 116 - 89 - 129   Hemoglobin A1c <5.7 % of total Hgb 5.9(H) 6.6% 6.0(H) 6.4(H) -   TCO2 22 - 32 mmol/L - - - - 19(L)       Exercise Target Goals:    Exercise Program Goal: Individual exercise prescription set using results from initial 6 min walk test and THRR while considering  patient's activity barriers and safety.   Exercise Prescription Goal: Initial exercise prescription builds to 30-45 minutes a day of aerobic activity, 2-3 days per week.  Home exercise guidelines will be given to patient during program as part of exercise prescription that the participant will acknowledge.  Activity Barriers & Risk Stratification: Activity Barriers & Cardiac Risk Stratification - 02/05/18 1238      Activity Barriers & Cardiac Risk Stratification   Activity Barriers  Joint Problems;Shortness of Breath;Muscular Weakness;Deconditioning;Balance Concerns R knee has had arthoscopy, chronic knee pain    Cardiac Risk Stratification  Moderate  6 Minute Walk: 6 Minute Walk    Row Name 02/05/18 1433 03/27/18 0854       6 Minute Walk   Phase  Initial  Discharge    Distance  1035 feet  1220 feet    Walk Time  6 minutes  6 minutes    # of Rest Breaks  0  0    MPH  1.96  2.31    METS  2.21  2.85    RPE  9  12    Perceived Dyspnea   -  1    VO2 Peak  7.75  9.97    Symptoms  No  Yes (comment)    Comments  -  R knee pain 6/10     Resting HR  66 bpm  85 bpm    Resting BP  128/70  132/68    Resting Oxygen Saturation   98 %  98 %    Exercise Oxygen Saturation  during 6 min walk  98 %  98 %    Max Ex. HR  82 bpm  98 bpm    Max Ex. BP  120/74  140/70    2 Minute Post BP  124/70  128/68        Oxygen Initial Assessment:   Oxygen Re-Evaluation:   Oxygen Discharge (Final Oxygen Re-Evaluation):   Initial Exercise Prescription: Initial Exercise Prescription - 02/05/18 1400      Date of Initial Exercise RX and Referring Provider   Date  02/05/18    Referring Provider  End, Harrell Gave MD      Treadmill   MPH  1.9    Grade  0.5    Minutes  15    METs  2.59      Recumbant Bike   Level  1    RPM  50    Watts  10    Minutes  15    METs  2.5      NuStep   Level  1    SPM  80    Minutes  15    METs  2.5      Prescription Details   Frequency (times per week)  2    Duration  Progress to 30 minutes of continuous aerobic without signs/symptoms of physical distress      Intensity   THRR 40-80% of Max Heartrate  99-132    Ratings of Perceived Exertion  11-13    Perceived Dyspnea  0-4      Progression   Progression  Continue to progress workloads to maintain intensity without signs/symptoms of physical distress.      Resistance Training   Training Prescription  Yes    Weight  3 lbs    Reps  10-15       Perform Capillary Blood Glucose checks as needed.  Exercise Prescription Changes: Exercise Prescription Changes    Row Name 02/05/18 1200 02/13/18 1500 02/27/18 1400 03/08/18 1000 03/13/18 1500     Response to Exercise   Blood Pressure (Admit)  128/70  112/60  140/76  -  126/62   Blood Pressure (Exercise)  120/74  128/70  154/84  -  118/66   Blood Pressure (Exit)  124/70  108/60  124/74  -  128/64   Heart Rate (Admit)  66 bpm  88 bpm  81 bpm  -  87 bpm   Heart Rate (Exercise)  82 bpm  116 bpm  114 bpm  -  109 bpm  Heart Rate (Exit)  76 bpm  73 bpm  60 bpm  -  82 bpm   Oxygen Saturation (Admit)  98 %  -  -  -  -   Oxygen Saturation (Exercise)  98 %  -  -  -  -   Rating of Perceived Exertion (Exercise)  _0 -  14   Symptoms  none  none  none  -  none   Comments  walk test results  second full day of exercise  -  -  -   Duration  -   Continue with 30 min of aerobic exercise without signs/symptoms of physical distress.  Continue with 30 min of aerobic exercise without signs/symptoms of physical distress.  -  Continue with 30 min of aerobic exercise without signs/symptoms of physical distress.   Intensity  -  THRR unchanged  THRR unchanged  -  THRR unchanged     Progression   Progression  -  Continue to progress workloads to maintain intensity without signs/symptoms of physical distress.  Continue to progress workloads to maintain intensity without signs/symptoms of physical distress.  -  Continue to progress workloads to maintain intensity without signs/symptoms of physical distress.   Average METs  -  2.5  3  -  3.41     Resistance Training   Training Prescription  -  Yes  Yes  -  Yes   Weight  -  3 lbs  3 lbs  -  3 lbs   Reps  -  10-15  10-15  -  10-15     Interval Training   Interval Training  -  No  No  -  No     Treadmill   MPH  -  1.1  2.2  -  2.2   Grade  -  0.5  2.5  -  2.5   Minutes  -  15  15  -  15   METs  -  1.92  3.44  -  3.44     Recumbant Bike   Level  -  1  2  -  7   Watts  -  18  26  -  37   Minutes  -  15  15  -  15   METs  -  2.74  2.98  -  3.38     NuStep   Level  -  3  4  -  6   Minutes  -  15  15  -  15   METs  -  2.8  2.6  -  3.4     Home Exercise Plan   Plans to continue exercise at  -  -  -  Home (comment) walking  Home (comment) walking   Frequency  -  -  -  Add 3 additional days to program exercise sessions.  Add 3 additional days to program exercise sessions.   Initial Home Exercises Provided  -  -  -  03/08/18  03/08/18   Row Name 03/27/18 1500             Response to Exercise   Blood Pressure (Admit)  132/68       Blood Pressure (Exercise)  140/70       Blood Pressure (Exit)  128/64       Heart Rate (Admit)  85 bpm       Heart Rate (Exercise)  106 bpm  Heart Rate (Exit)  64 bpm       Rating of Perceived Exertion (Exercise)  13       Symptoms  none       Duration   Continue with 30 min of aerobic exercise without signs/symptoms of physical distress.       Intensity  THRR unchanged         Progression   Progression  Continue to progress workloads to maintain intensity without signs/symptoms of physical distress.       Average METs  3.47         Resistance Training   Training Prescription  Yes       Weight  3 lbs       Reps  10-15         Interval Training   Interval Training  No         Treadmill   MPH  2.2       Grade  2.5       Minutes  15       METs  3.44         Recumbant Bike   Level  7       Watts  44       Minutes  15       METs  3.77         NuStep   Level  6       Minutes  15       METs  3.2         Home Exercise Plan   Plans to continue exercise at  Home (comment) walking       Frequency  Add 3 additional days to program exercise sessions.       Initial Home Exercises Provided  03/08/18          Exercise Comments: Exercise Comments    Row Name 02/08/18 0827           Exercise Comments  First full day of exercise!  Patient was oriented to gym and equipment including functions, settings, policies, and procedures.  Patient's individual exercise prescription and treatment plan were reviewed.  All starting workloads were established based on the results of the 6 minute walk test done at initial orientation visit.  The plan for exercise progression was also introduced and progression will be customized based on patient's performance and goals.          Exercise Goals and Review: Exercise Goals    Row Name 02/05/18 1439             Exercise Goals   Increase Physical Activity  Yes       Intervention  Provide advice, education, support and counseling about physical activity/exercise needs.;Develop an individualized exercise prescription for aerobic and resistive training based on initial evaluation findings, risk stratification, comorbidities and participant's personal goals.       Expected Outcomes  Short Term:  Attend rehab on a regular basis to increase amount of physical activity.;Long Term: Add in home exercise to make exercise part of routine and to increase amount of physical activity.;Long Term: Exercising regularly at least 3-5 days a week.       Increase Strength and Stamina  Yes       Intervention  Provide advice, education, support and counseling about physical activity/exercise needs.;Develop an individualized exercise prescription for aerobic and resistive training based on initial evaluation findings, risk stratification, comorbidities and participant's personal goals.  Expected Outcomes  Short Term: Increase workloads from initial exercise prescription for resistance, speed, and METs.;Short Term: Perform resistance training exercises routinely during rehab and add in resistance training at home;Long Term: Improve cardiorespiratory fitness, muscular endurance and strength as measured by increased METs and functional capacity (6MWT)       Able to understand and use rate of perceived exertion (RPE) scale  Yes       Intervention  Provide education and explanation on how to use RPE scale       Expected Outcomes  Short Term: Able to use RPE daily in rehab to express subjective intensity level;Long Term:  Able to use RPE to guide intensity level when exercising independently       Knowledge and understanding of Target Heart Rate Range (THRR)  Yes       Intervention  Provide education and explanation of THRR including how the numbers were predicted and where they are located for reference       Expected Outcomes  Short Term: Able to state/look up THRR;Long Term: Able to use THRR to govern intensity when exercising independently;Short Term: Able to use daily as guideline for intensity in rehab       Able to check pulse independently  Yes       Intervention  Provide education and demonstration on how to check pulse in carotid and radial arteries.;Review the importance of being able to check your own  pulse for safety during independent exercise       Expected Outcomes  Short Term: Able to explain why pulse checking is important during independent exercise;Long Term: Able to check pulse independently and accurately       Understanding of Exercise Prescription  Yes       Intervention  Provide education, explanation, and written materials on patient's individual exercise prescription       Expected Outcomes  Short Term: Able to explain program exercise prescription;Long Term: Able to explain home exercise prescription to exercise independently          Exercise Goals Re-Evaluation : Exercise Goals Re-Evaluation    [place] Name 02/08/18 0827 02/13/18 1557 02/20/18 0932 02/27/18 1436 03/08/18 1008     Exercise Goal Re-Evaluation   Exercise Goals Review  Increase Physical Activity;Able to understand and use rate of perceived exertion (RPE) scale;Knowledge and understanding of Target Heart Rate Range (THRR);Understanding of Exercise Prescription  Increase Physical Activity;Understanding of Exercise Prescription;Increase Strength and Stamina  Increase Physical Activity;Understanding of Exercise Prescription;Increase Strength and Stamina  Increase Physical Activity;Understanding of Exercise Prescription;Increase Strength and Stamina  Increase Physical Activity;Able to understand and use rate of perceived exertion (RPE) scale;Knowledge and understanding of Target Heart Rate Range (THRR);Understanding of Exercise Prescription;Increase Strength and Stamina;Able to check pulse independently   Comments  Reviewed RPE scale, THR and program prescription with pt today.  Pt voiced understanding and was given a copy of goals to take home.   Raeshaun is off to a good start in rehab.  We have had to reduce his walking speed on the treadmill as he was not able to maintain the 1.9 mph so he was acutally doing 1.1 mph today.  Otherwise, he has been able to tolerate the workloads well.  We will continue to monitor his progress.    Quinto is doing well in rehab. He is also walking at home for about 39mn 3-4x a week.  So far he is doing well with his walking on level ground.  He is starting to get  back his strength and stamina.   Farrell has been doing well in rehab.  He is now up to 26 watts on the bike and 2.5% on the treadmill. We will continue to monitor his progression.   Reviewed home exercise with pt today.  Pt plans to walk at home for exercise.  Reviewed THR, pulse, RPE, sign and symptoms, and when to call 911 or MD.  Also discussed weather considerations and indoor options.  Pt voiced understanding.   Expected Outcomes  Short: Use RPE daily to regulate intensity.  Long: Follow program prescription in THR.  Short: Continue to creep speed up on treadmill.  Long: Continue to follow program prescription and attend regularly.   Short: Start adding in weights at home in addition to walking and go up on weights in class.  Long: Continue to walking on off days.   Short: Review home exercise guidelines.  Continue to encourage increasing his step rate on the NuStep.  Long: Continue to walk independently.   Short: Continue walking on off days and increase time to 30 min.  Long: Continue to exercise more.    [place] Name 03/13/18 1522 03/27/18 0856           Exercise Goal Re-Evaluation   Exercise Goals Review  Increase Physical Activity;Understanding of Exercise Prescription;Increase Strength and Stamina  Increase Physical Activity;Understanding of Exercise Prescription;Increase Strength and Stamina      Comments  Joselito has been doing well in rehab.  He is up to level 7 on the recumbent bike. We will continue to monitor his progression.   Lajuane has been doing well in rehab. He reports feeling stronger not only in rehab but also at home. He is still on level 7 on the recumbent bike. We will continue to monitor his progression.      Expected Outcomes  Short: Continue to walk up to 30 min on off days.  Long: Continue to work on Publishing copy.   Short: Continue to walk up to 30 min on off days.  Long: Continue to work on IT sales professional.          Discharge Exercise Prescription (Final Exercise Prescription Changes): Exercise Prescription Changes - 03/27/18 1500      Response to Exercise   Blood Pressure (Admit)  132/68    Blood Pressure (Exercise)  140/70    Blood Pressure (Exit)  128/64    Heart Rate (Admit)  85 bpm    Heart Rate (Exercise)  106 bpm    Heart Rate (Exit)  64 bpm    Rating of Perceived Exertion (Exercise)  13    Symptoms  none    Duration  Continue with 30 min of aerobic exercise without signs/symptoms of physical distress.    Intensity  THRR unchanged      Progression   Progression  Continue to progress workloads to maintain intensity without signs/symptoms of physical distress.    Average METs  3.47      Resistance Training   Training Prescription  Yes    Weight  3 lbs    Reps  10-15      Interval Training   Interval Training  No      Treadmill   MPH  2.2    Grade  2.5    Minutes  15    METs  3.44      Recumbant Bike   Level  7    Watts  44    Minutes  15  METs  3.77      NuStep   Level  6    Minutes  15    METs  3.2      Home Exercise Plan   Plans to continue exercise at  Home (comment) walking    Frequency  Add 3 additional days to program exercise sessions.    Initial Home Exercises Provided  03/08/18       Nutrition:  Target Goals: Understanding of nutrition guidelines, daily intake of sodium '1500mg'$ , cholesterol '200mg'$ , calories 30% from fat and 7% or less from saturated fats, daily to have 5 or more servings of fruits and vegetables.  Biometrics: Pre Biometrics - 02/05/18 1440      Pre Biometrics   Height  5' 8.75" (1.746 m)    Weight  180 lb (81.6 kg)    Waist Circumference  37 inches    Hip Circumference  38.5 inches    Waist to Hip Ratio  0.96 %    BMI (Calculated)  26.78    Single Leg Stand  2.79 seconds      Post Biometrics - 03/27/18 0858        Post  Biometrics   Height  5' 8.75" (1.746 m)    Weight  177 lb (80.3 kg)    Waist Circumference  36 inches    Hip Circumference  38 inches    Waist to Hip Ratio  0.95 %    BMI (Calculated)  26.34    Single Leg Stand  4.99 seconds       Nutrition Therapy Plan and Nutrition Goals: Nutrition Therapy & Goals - 02/15/18 1142      Nutrition Therapy   Diet  TLC    Drug/Food Interactions  Statins/Certain Fruits    Fruits and Vegetables  8 servings/day    Sodium  1500 grams      Personal Nutrition Goals   Nutrition Goal  Continue with current healthy eating pattern    Comments  Mr. Palma has been limiting sources of saturated fat, and limiting sodium intake; he and his wife report reading food labels. He eats vegetables and fruits regularly as well as beans, nuts, and fish.        Nutrition Assessments: Nutrition Assessments - 04/03/18 1132      MEDFICTS Scores   Pre Score  34    Post Score  22    Score Difference  -12       Nutrition Goals Re-Evaluation: Nutrition Goals Re-Evaluation    Row Name 02/20/18 0937 03/27/18 0859           Goals   Current Weight  -  177 lb (80.3 kg)      Nutrition Goal  Continue with current healthy eating pattern  Continue to follow current healthy eating pattern that was reviewed last month with dietician.       Comment  Jeneen Rinks met with dietician last wekk.  He got some new materials and recipes. They continue to read their labels and watch their fat contents.  They enjoyed their appointment and it confirmed what they had already been doing.   Khadar said he is still watching the fat content in his food and reading the labels before purchase. He reports being smaller than he has been in 2 years.       Expected Outcome  Short: Continue to read labels. Long: Continue with heart healthy eating patteren.   Short: Continue to read labels. Long: Continue with heart healthy eating  patteren.          Nutrition Goals Discharge (Final Nutrition  Goals Re-Evaluation): Nutrition Goals Re-Evaluation - 03/27/18 0859      Goals   Current Weight  177 lb (80.3 kg)    Nutrition Goal  Continue to follow current healthy eating pattern that was reviewed last month with dietician.     Comment  Grantland said he is still watching the fat content in his food and reading the labels before purchase. He reports being smaller than he has been in 2 years.     Expected Outcome  Short: Continue to read labels. Long: Continue with heart healthy eating patteren.        Psychosocial: Target Goals: Acknowledge presence or absence of significant depression and/or stress, maximize coping skills, provide positive support system. Participant is able to verbalize types and ability to use techniques and skills needed for reducing stress and depression.   Initial Review & Psychosocial Screening: Initial Psych Review & Screening - 02/05/18 1251      Initial Review   Current issues with  None Identified      Family Dynamics   Good Support System?  Yes Spouse, family, friends, pastor      Barriers   Psychosocial barriers to participate in program  There are no identifiable barriers or psychosocial needs.;The patient should benefit from training in stress management and relaxation.      Screening Interventions   Interventions  Encouraged to exercise;Provide feedback about the scores to participant;To provide support and resources with identified psychosocial needs    Expected Outcomes  Short Term goal: Utilizing psychosocial counselor, staff and physician to assist with identification of specific Stressors or current issues interfering with healing process. Setting desired goal for each stressor or current issue identified.;Long Term Goal: Stressors or current issues are controlled or eliminated.;Short Term goal: Identification and review with participant of any Quality of Life or Depression concerns found by scoring the questionnaire.;Long Term goal: The participant  improves quality of Life and PHQ9 Scores as seen by post scores and/or verbalization of changes       Quality of Life Scores:  Quality of Life - 04/03/18 1132      Quality of Life Scores   Health/Function Pre  26.18 %    Health/Function Post  20.77 %    Health/Function % Change  -20.66 %    Socioeconomic Pre  30 %    Socioeconomic Post  27.43 %    Socioeconomic % Change   -8.57 %    Psych/Spiritual Pre  30 %    Psych/Spiritual Post  29.64 %    Psych/Spiritual % Change  -1.2 %    Family Pre  30 %    Family Post  25.2 %    Family % Change  -16 %    GLOBAL Pre  28.5 %    GLOBAL Post  24.86 %    GLOBAL % Change  -12.77 %      Scores of 19 and below usually indicate a poorer quality of life in these areas.  A difference of  2-3 points is a clinically meaningful difference.  A difference of 2-3 points in the total score of the Quality of Life Index has been associated with significant improvement in overall quality of life, self-image, physical symptoms, and general health in studies assessing change in quality of life.  PHQ-9: Recent Review Flowsheet Data    Depression screen [facility] 2/9 04/03/2018 02/05/2018 01/01/2018 06/27/2017 08/09/2016  Decreased Interest 0 0 0 0 1   Down, Depressed, Hopeless 0 0 0 0 0   PHQ - 2 Score 0 0 0 0 1   Altered sleeping 1 0 - - -   Tired, decreased energy 1 0 - - -   Change in appetite 1 0 - - -   Feeling bad or failure about yourself  0 0 - - -   Trouble concentrating 1 0 - - -   Moving slowly or fidgety/restless 1 0 - - -   Suicidal thoughts 0 0 - - -   PHQ-9 Score 5 0 - - -   Difficult doing work/chores Somewhat difficult Not difficult at all - - -     Interpretation of Total Score  Total Score Depression Severity:  1-4 = Minimal depression, 5-9 = Mild depression, 10-14 = Moderate depression, 15-19 = Moderately severe depression, 20-27 = Severe depression   Psychosocial Evaluation and Intervention: Psychosocial Evaluation - 02/08/18 0934       Psychosocial Evaluation & Interventions   Interventions  Encouraged to exercise with the program and follow exercise prescription    Comments  Counselor met with Mr. Jolliff Smick) today for initial psychosocial evaluation.  He is a 72 year old who had a heart attack and a stent on 4/2.  He has a strong support system with a spouse & daughter who lives in the home and he has a brother and good friend who also live locally.  Joshau was diagnosed with pre-diabetes when he was in the hospital so he is working to bring those numbers down with diet and exercise.  Kwamane sleeps well and has a good appetite.  He denies a history of depression or anxiety or any current symptoms and is typically in a positive mood most of the time.  Patricio has minimal stress in his life other than his health.  Scout has goals to get back to his normal activities and decrease his blood sugar levels.  Staff will follow with him.     Expected Outcomes  Short:  Amando will change his lifestyle to eat more healthy and exercise to decrease his blood sugar levels.  Long:  Griff will exercise consistently to get his heart healthier and increase his stamina and strength.    Continue Psychosocial Services   Follow up required by staff       Psychosocial Re-Evaluation: Psychosocial Re-Evaluation    Row Name 02/20/18 [phone] 03/27/18 0901           Psychosocial Re-Evaluation   Current issues with  Current Stress Concerns;None Identified  None Identified      Comments  Sherill is doing well in rehab. He remains postive overall.  He has found the biggest benefit has been regaining his strength which has let him be able to do more at home.  He continues to sleep well.   Bakary is still doing well in rehab. He is positive about his progression so far and happy about all that he is now able to do at home; he has been doing more yardwork, mopping and vaccuming. His only concern is that he is scared of doing too much.       Expected Outcomes  Short:  Continue to attend rehab regularly. Long: Continue to remain postive.  Short: Continue to attend rehab regularly. Long: Continue to remain postive and increase strength.       Interventions  Stress management education;Encouraged to attend Cardiac Rehabilitation for the exercise  Stress management education;Encouraged to attend Cardiac Rehabilitation for the exercise      Continue Psychosocial Services   Follow up required by staff  Follow up required by staff         Psychosocial Discharge (Final Psychosocial Re-Evaluation): Psychosocial Re-Evaluation - 03/27/18 0901      Psychosocial Re-Evaluation   Current issues with  None Identified    Comments  Ameet is still doing well in rehab. He is positive about his progression so far and happy about all that he is now able to do at home; he has been doing more yardwork, mopping and vaccuming. His only concern is that he is scared of doing too much.     Expected Outcomes  Short: Continue to attend rehab regularly. Long: Continue to remain postive and increase strength.     Interventions  Stress management education;Encouraged to attend Cardiac Rehabilitation for the exercise    Continue Psychosocial Services   Follow up required by staff       Vocational Rehabilitation: Provide vocational rehab assistance to qualifying candidates.   Vocational Rehab Evaluation & Intervention: Vocational Rehab - 02/05/18 1254      Initial Vocational Rehab Evaluation & Intervention   Assessment shows need for Vocational Rehabilitation  No       Education: Education Goals: Education classes will be provided on a variety of topics geared toward better understanding of heart health and risk factor modification. Participant will state understanding/return demonstration of topics presented as noted by education test scores.  Learning Barriers/Preferences: Learning Barriers/Preferences - 02/05/18 1253      Learning Barriers/Preferences   Learning Barriers   Exercise Concerns    Learning Preferences  Verbal Instruction;Written Material       Education Topics:  AED/CPR: - Group verbal and written instruction with the use of models to demonstrate the basic use of the AED with the basic ABC's of resuscitation.   Cardiac Rehab from 04/03/2018 in [facility] Cardiac and Pulmonary Rehab  Date  03/08/18  Educator  CE  Instruction Review Code  1- Verbalizes Understanding      General Nutrition Guidelines/Fats and Fiber: -Group instruction provided by verbal, written material, models and posters to present the general guidelines for heart healthy nutrition. Gives an explanation and review of dietary fats and fiber.   Cardiac Rehab from 04/03/2018 in [facility] Cardiac and Pulmonary Rehab  Date  03/06/18  Educator  PI  Instruction Review Code  1- Verbalizes Understanding      Controlling Sodium/Reading Food Labels: -Group verbal and written material supporting the discussion of sodium use in heart healthy nutrition. Review and explanation with models, verbal and written materials for utilization of the food label.   Cardiac Rehab from 04/03/2018 in [facility] Cardiac and Pulmonary Rehab  Date  03/13/18  Educator  PI  Instruction Review Code  1- Verbalizes Understanding      Exercise Physiology & General Exercise Guidelines: - Group verbal and written instruction with models to review the exercise physiology of the cardiovascular system and associated critical values. Provides general exercise guidelines with specific guidelines to those with heart or lung disease.    Cardiac Rehab from 04/03/2018 in [facility] Cardiac and Pulmonary Rehab  Date  03/20/18  Educator  AS  Instruction Review Code  1- Verbalizes Understanding      Aerobic Exercise & Resistance Training: - Gives group verbal and written instruction on the various components of exercise. Focuses on aerobic and resistive training programs and the benefits of this training  and how to safely progress through  these programs..   Cardiac Rehab from 04/03/2018 in [facility] Cardiac and Pulmonary Rehab  Date  04/03/18  Educator  [facility]  Instruction Review Code  1- Verbalizes Understanding      Flexibility, Balance, Mind/Body Relaxation: Provides group verbal/written instruction on the benefits of flexibility and balance training, including mind/body exercise modes such as yoga, pilates and tai chi.  Demonstration and skill practice provided.   Stress and Anxiety: - Provides group verbal and written instruction about the health risks of elevated stress and causes of high stress.  Discuss the correlation between heart/lung disease and anxiety and treatment options. Review healthy ways to manage with stress and anxiety.   Cardiac Rehab from 04/03/2018 in [facility] Cardiac and Pulmonary Rehab  Date  02/13/18  Educator  [facility]  Instruction Review Code  1- Verbalizes Understanding      Depression: - Provides group verbal and written instruction on the correlation between heart/lung disease and depressed mood, treatment options, and the stigmas associated with seeking treatment.   Cardiac Rehab from 04/03/2018 in [facility] Cardiac and Pulmonary Rehab  Date  03/27/18  Educator  [facility]  Instruction Review Code  1- Verbalizes Understanding      Anatomy & Physiology of the Heart: - Group verbal and written instruction and models provide basic cardiac anatomy and physiology, with the coronary electrical and arterial systems. Review of Valvular disease and Heart Failure   Cardiac Rehab from 04/03/2018 in [facility] Cardiac and Pulmonary Rehab  Date  02/15/18  Educator  CE  Instruction Review Code  1- Verbalizes Understanding      Cardiac Procedures: - Group verbal and written instruction to review commonly prescribed medications for heart disease. Reviews the medication, class of the drug, and side effects. Includes the steps to properly store meds and maintain the prescription regimen. (beta blockers and nitrates)   Cardiac Rehab  from 04/03/2018 in [facility] Cardiac and Pulmonary Rehab  Date  03/01/18  Educator  CE  Instruction Review Code  1- Verbalizes Understanding      Cardiac Medications I: - Group verbal and written instruction to review commonly prescribed medications for heart disease. Reviews the medication, class of the drug, and side effects. Includes the steps to properly store meds and maintain the prescription regimen.   Cardiac Rehab from 04/03/2018 in [facility] Cardiac and Pulmonary Rehab  Date  02/20/18  Educator  SB  Instruction Review Code  1- Verbalizes Understanding      Cardiac Medications II: -Group verbal and written instruction to review commonly prescribed medications for heart disease. Reviews the medication, class of the drug, and side effects. (all other drug classes)   Cardiac Rehab from 04/03/2018 in [facility] Cardiac and Pulmonary Rehab  Date  03/29/18  Educator  CE  Instruction Review Code  1- Verbalizes Understanding       Go Sex-Intimacy & Heart Disease, Get SMART - Goal Setting: - Group verbal and written instruction through game format to discuss heart disease and the return to sexual intimacy. Provides group verbal and written material to discuss and apply goal setting through the application of the S.M.A.R.T. Method.   Cardiac Rehab from 04/03/2018 in [facility] Cardiac and Pulmonary Rehab  Date  03/01/18  Educator  CE  Instruction Review Code  1- Verbalizes Understanding      Other Matters of the Heart: - Provides group verbal, written materials and models to describe Stable Angina and Peripheral Artery. Includes description of the disease process and treatment  options available to the cardiac patient.   Exercise & Equipment Safety: - Individual verbal instruction and demonstration of equipment use and safety with use of the equipment.   Cardiac Rehab from 04/03/2018 in [facility] Cardiac and Pulmonary Rehab  Date  02/05/18  Educator  [facility]  Instruction Review Code  1- Verbalizes Understanding       Infection Prevention: - Provides verbal and written material to individual with discussion of infection control including proper hand washing and proper equipment cleaning during exercise session.   Cardiac Rehab from 04/03/2018 in [facility] Cardiac and Pulmonary Rehab  Date  02/05/18  Educator  [facility]  Instruction Review Code  1- Verbalizes Understanding      Falls Prevention: - Provides verbal and written material to individual with discussion of falls prevention and safety.   Cardiac Rehab from 04/03/2018 in [facility] Cardiac and Pulmonary Rehab  Date  02/05/18  Educator  [facility]  Instruction Review Code  1- Verbalizes Understanding      Diabetes: - Individual verbal and written instruction to review signs/symptoms of diabetes, desired ranges of glucose level fasting, after meals and with exercise. Acknowledge that pre and post exercise glucose checks will be done for 3 sessions at entry of program.   Know Your Numbers and Risk Factors: -Group verbal and written instruction about important numbers in your health.  Discussion of what are risk factors and how they play a role in the disease process.  Review of Cholesterol, Blood Pressure, Diabetes, and BMI and the role they play in your overall health.   Cardiac Rehab from 04/03/2018 in [facility] Cardiac and Pulmonary Rehab  Date  03/29/18  Educator  CE  Instruction Review Code  1- Verbalizes Understanding      Sleep Hygiene: -Provides group verbal and written instruction about how sleep can affect your health.  Define sleep hygiene, discuss sleep cycles and impact of sleep habits. Review good sleep hygiene tips.    Cardiac Rehab from 04/03/2018 in [facility] Cardiac and Pulmonary Rehab  Date  02/27/18  Educator  [facility]  Instruction Review Code  1- Verbalizes Understanding      Other: -Provides group and verbal instruction on various topics (see comments)   Knowledge Questionnaire Score: Knowledge Questionnaire Score - 04/03/18 1132      Knowledge  Questionnaire Score   Pre Score  23/28    Post Score  24/28       Core Components/Risk Factors/Patient Goals at Admission: Personal Goals and Risk Factors at Admission - 02/05/18 1240      Core Components/Risk Factors/Patient Goals on Admission    Weight Management  Yes;Weight Loss    Intervention  Weight Management: Develop a combined nutrition and exercise program designed to reach desired caloric intake, while maintaining appropriate intake of nutrient and fiber, sodium and fats, and appropriate energy expenditure required for the weight goal.;Weight Management: Provide education and appropriate resources to help participant work on and attain dietary goals.    Admit Weight  180 lb (81.6 kg)    Goal Weight: Short Term  178 lb (80.7 kg)    Goal Weight: Long Term  175 lb (79.4 kg)    Expected Outcomes  Short Term: Continue to assess and modify interventions until short term weight is achieved;Long Term: Adherence to nutrition and physical activity/exercise program aimed toward attainment of established weight goal;Weight Loss: Understanding of general recommendations for a balanced deficit meal plan, which promotes 1-2 lb weight loss per week and includes a negative energy balance of  [phone] kcal/d;Understanding recommendations for meals to include 15-35% energy as protein, 25-35% energy from fat, 35-60% energy from carbohydrates, less than '200mg'$  of dietary cholesterol, 20-35 gm of total fiber daily;Understanding of distribution of calorie intake throughout the day with the consumption of 4-5 meals/snacks BMI is 26.   Would advise small weight loss if possible.  Exercise and nutrition changes may make this happen    Diabetes  Yes IS being watched by MD for DIabetes.  A1C has been close to Diabetes diagnosis numbers.  Blood work is scheduled for followp in a few months.  Will work on the exercise and nutrition while in program    Intervention  Provide education about signs/symptoms and action to  take for hypo/hyperglycemia.;Provide education about proper nutrition, including hydration, and aerobic/resistive exercise prescription along with prescribed medications to achieve blood glucose in normal ranges: Fasting glucose 65-99 mg/dL    Expected Outcomes  Short Term: Participant verbalizes understanding of the signs/symptoms and immediate care of hyper/hypoglycemia, proper foot care and importance of medication, aerobic/resistive exercise and nutrition plan for blood glucose control.;Long Term: Attainment of HbA1C < 7%.    Hypertension  Yes    Intervention  Provide education on lifestyle modifcations including regular physical activity/exercise, weight management, moderate sodium restriction and increased consumption of fresh fruit, vegetables, and low fat dairy, alcohol moderation, and smoking cessation.;Monitor prescription use compliance.    Expected Outcomes  Short Term: Continued assessment and intervention until BP is < 140/58m HG in hypertensive participants. < 130/840mHG in hypertensive participants with diabetes, heart failure or chronic kidney disease.;Long Term: Maintenance of blood pressure at goal levels.    Lipids  Yes    Intervention  Provide education and support for participant on nutrition & aerobic/resistive exercise along with prescribed medications to achieve LDL '70mg'$ , HDL >'40mg'$ .    Expected Outcomes  Short Term: Participant states understanding of desired cholesterol values and is compliant with medications prescribed. Participant is following exercise prescription and nutrition guidelines.;Long Term: Cholesterol controlled with medications as prescribed, with individualized exercise RX and with personalized nutrition plan. Value goals: LDL < '70mg'$ , HDL > 40 mg.       Core Components/Risk Factors/Patient Goals Review:  Goals and Risk Factor Review    Row Name 02/20/18 0934 03/27/18 0915           Core Components/Risk Factors/Patient Goals Review   Personal Goals  Review  Weight Management/Obesity;Hypertension;Lipids  Weight Management/Obesity;Hypertension;Lipids      Review  JaWessas been doing well in rehab, He is down to 179 lbs today and working his way towards his goal of 175lbs.  He has been watching his diet.  His blood pressures have been good in class and he checks it at home three times a week. He is doing well with his medications.   JaJerrelleontinues to do well in rehab.  He is was down to 177 lbs today!!  He continues to monitor his blood pressures and is doing well on medications.       Expected Outcomes  Short: Continue to work toward weight loss goal.  Long: Continue to monitor blood pressures.   Short: Continue to work on weigh loss.  Long: Continue to monitor risk factors         Core Components/Risk Factors/Patient Goals at Discharge (Final Review):  Goals and Risk Factor Review - 03/27/18 0915      Core Components/Risk Factors/Patient Goals Review   Personal Goals Review  Weight Management/Obesity;Hypertension;Lipids    Review  Lavi continues to do well in rehab.  He is was down to 177 lbs today!!  He continues to monitor his blood pressures and is doing well on medications.     Expected Outcomes  Short: Continue to work on weigh loss.  Long: Continue to monitor risk factors       ITP Comments: ITP Comments    Row Name 02/05/18 1236 02/07/18 0601 03/07/18 0612 03/29/18 0846 04/04/18 0611   ITP Comments  Medical review completed. INitial ITP sent to Dr Loleta Chance for review, changes as needed and signature. Documentation of diagnosis can be found in [facility] Encounter 01/16/2018  30 day review. Continue with ITP unless directed changes per Medical Director.    new to  program  30 day review. Continue with ITP unless directed changes per Medical Director  Moved off monitor today  30 day review. Continue with ITP unless directed changes per Medical Director review      Comments:

## 2018-04-05 ENCOUNTER — Encounter: Payer: PPO | Admitting: *Deleted

## 2018-04-05 DIAGNOSIS — Z955 Presence of coronary angioplasty implant and graft: Secondary | ICD-10-CM

## 2018-04-05 DIAGNOSIS — I213 ST elevation (STEMI) myocardial infarction of unspecified site: Secondary | ICD-10-CM

## 2018-04-05 DIAGNOSIS — Z48812 Encounter for surgical aftercare following surgery on the circulatory system: Secondary | ICD-10-CM | POA: Diagnosis not present

## 2018-04-05 NOTE — Progress Notes (Signed)
Daily Session Note  Patient Details  Name: Barry [name] MRN: [id number] Date of Birth: Sep 11, 1946 Referring Provider:     Cardiac Rehab from 02/05/2018 in [facility] Cardiac and Pulmonary Rehab  Referring Provider  End, Harrell Gave MD      Encounter Date: 04/05/2018  Check In: Session Check In - 04/05/18 0936      Check-In   Location  [facility]-Cardiac & Pulmonary Rehab    Staff Present  Alberteen Sam, MA, RCEP, CCRP, Exercise Physiologist;Amanda Oletta Darter, BA, ACSM CEP, Exercise Physiologist;Carroll Enterkin, RN, BSN    Supervising physician immediately available to respond to emergencies  See telemetry face sheet for immediately available ER MD    Medication changes reported      No    Fall or balance concerns reported     No    Warm-up and Cool-down  Performed on first and last piece of equipment    Resistance Training Performed  Yes    VAD Patient?  No      Pain Assessment   Currently in Pain?  No/denies          Social History   Tobacco Use  Smoking Status Never Smoker  Smokeless Tobacco Former Systems developer  . Types: Chew    Goals Met:  Independence with exercise equipment Exercise tolerated well No report of cardiac concerns or symptoms Strength training completed today  Goals Unmet:  Not Applicable  Comments: Pt able to follow exercise prescription today without complaint.  Will continue to monitor for progression.    Dr. Emily Filbert is Medical Director for [place] and [name].

## 2018-04-05 NOTE — Patient Instructions (Signed)
Discharge Patient Instructions  Patient Details  Name: Barry [name] MRN: [id number] Date of Birth: 1946/04/13 Referring Provider:  Nelva Bush, MD   Number of Visits: 87  Reason for Discharge:  Patient reached a stable level of exercise. Patient independent in their exercise. Patient has met program and personal goals.  Smoking History:  Social History   Tobacco Use  Smoking Status Never Smoker  Smokeless Tobacco Former Systems developer  . Types: Chew    Diagnosis:  ST elevation myocardial infarction (STEMI), unspecified artery ([place])  Status post coronary artery stent placement  Initial Exercise Prescription: Initial Exercise Prescription - 02/05/18 1400      Date of Initial Exercise RX and Referring Provider   Date  02/05/18    Referring Provider  End, Harrell Gave MD      Treadmill   MPH  1.9    Grade  0.5    Minutes  15    METs  2.59      Recumbant Bike   Level  1    RPM  50    Watts  10    Minutes  15    METs  2.5      NuStep   Level  1    SPM  80    Minutes  15    METs  2.5      Prescription Details   Frequency (times per week)  2    Duration  Progress to 30 minutes of continuous aerobic without signs/symptoms of physical distress      Intensity   THRR 40-80% of Max Heartrate  99-132    Ratings of Perceived Exertion  11-13    Perceived Dyspnea  0-4      Progression   Progression  Continue to progress workloads to maintain intensity without signs/symptoms of physical distress.      Resistance Training   Training Prescription  Yes    Weight  3 lbs    Reps  10-15       Discharge Exercise Prescription (Final Exercise Prescription Changes): Exercise Prescription Changes - 03/27/18 1500      Response to Exercise   Blood Pressure (Admit)  132/68    Blood Pressure (Exercise)  140/70    Blood Pressure (Exit)  128/64    Heart Rate (Admit)  85 bpm    Heart Rate (Exercise)  106 bpm    Heart Rate (Exit)  64 bpm    Rating of Perceived Exertion  (Exercise)  13    Symptoms  none    Duration  Continue with 30 min of aerobic exercise without signs/symptoms of physical distress.    Intensity  THRR unchanged      Progression   Progression  Continue to progress workloads to maintain intensity without signs/symptoms of physical distress.    Average METs  3.47      Resistance Training   Training Prescription  Yes    Weight  3 lbs    Reps  10-15      Interval Training   Interval Training  No      Treadmill   MPH  2.2    Grade  2.5    Minutes  15    METs  3.44      Recumbant Bike   Level  7    Watts  44    Minutes  15    METs  3.77      NuStep   Level  6    Minutes  15    METs  3.2      Home Exercise Plan   Plans to continue exercise at  Home (comment) walking    Frequency  Add 3 additional days to program exercise sessions.    Initial Home Exercises Provided  03/08/18       Functional Capacity: 6 Minute Walk    Row Name 02/05/18 1433 03/27/18 0854       6 Minute Walk   Phase  Initial  Discharge    Distance  1035 feet  1220 feet    Walk Time  6 minutes  6 minutes    # of Rest Breaks  0  0    MPH  1.96  2.31    METS  2.21  2.85    RPE  9  12    Perceived Dyspnea   -  1    VO2 Peak  7.75  9.97    Symptoms  No  Yes (comment)    Comments  -  R knee pain 6/10     Resting HR  66 bpm  85 bpm    Resting BP  128/70  132/68    Resting Oxygen Saturation   98 %  98 %    Exercise Oxygen Saturation  during 6 min walk  98 %  98 %    Max Ex. HR  82 bpm  98 bpm    Max Ex. BP  120/74  140/70    2 Minute Post BP  124/70  128/68       Quality of Life: Quality of Life - 04/03/18 1132      Quality of Life Scores   Health/Function Pre  26.18 %    Health/Function Post  20.77 %    Health/Function % Change  -20.66 %    Socioeconomic Pre  30 %    Socioeconomic Post  27.43 %    Socioeconomic % Change   -8.57 %    Psych/Spiritual Pre  30 %    Psych/Spiritual Post  29.64 %    Psych/Spiritual % Change  -1.2 %     Family Pre  30 %    Family Post  25.2 %    Family % Change  -16 %    GLOBAL Pre  28.5 %    GLOBAL Post  24.86 %    GLOBAL % Change  -12.77 %       Personal Goals: Goals established at orientation with interventions provided to work toward goal. Personal Goals and Risk Factors at Admission - 02/05/18 1240      Core Components/Risk Factors/Patient Goals on Admission    Weight Management  Yes;Weight Loss    Intervention  Weight Management: Develop a combined nutrition and exercise program designed to reach desired caloric intake, while maintaining appropriate intake of nutrient and fiber, sodium and fats, and appropriate energy expenditure required for the weight goal.;Weight Management: Provide education and appropriate resources to help participant work on and attain dietary goals.    Admit Weight  180 lb (81.6 kg)    Goal Weight: Short Term  178 lb (80.7 kg)    Goal Weight: Long Term  175 lb (79.4 kg)    Expected Outcomes  Short Term: Continue to assess and modify interventions until short term weight is achieved;Long Term: Adherence to nutrition and physical activity/exercise program aimed toward attainment of established weight goal;Weight Loss: Understanding of general recommendations for a balanced deficit meal plan, which promotes 1-2 lb   weight loss per week and includes a negative energy balance of [phone] kcal/d;Understanding recommendations for meals to include 15-35% energy as protein, 25-35% energy from fat, 35-60% energy from carbohydrates, less than 242m of dietary cholesterol, 20-35 gm of total fiber daily;Understanding of distribution of calorie intake throughout the day with the consumption of 4-5 meals/snacks BMI is 26.   Would advise small weight loss if possible.  Exercise and nutrition changes may make this happen    Diabetes  Yes IS being watched by MD for DIabetes.  A1C has been close to Diabetes diagnosis numbers.  Blood work is scheduled for followp in a few months.  Will  work on the exercise and nutrition while in program    Intervention  Provide education about signs/symptoms and action to take for hypo/hyperglycemia.;Provide education about proper nutrition, including hydration, and aerobic/resistive exercise prescription along with prescribed medications to achieve blood glucose in normal ranges: Fasting glucose 65-99 mg/dL    Expected Outcomes  Short Term: Participant verbalizes understanding of the signs/symptoms and immediate care of hyper/hypoglycemia, proper foot care and importance of medication, aerobic/resistive exercise and nutrition plan for blood glucose control.;Long Term: Attainment of HbA1C < 7%.    Hypertension  Yes    Intervention  Provide education on lifestyle modifcations including regular physical activity/exercise, weight management, moderate sodium restriction and increased consumption of fresh fruit, vegetables, and low fat dairy, alcohol moderation, and smoking cessation.;Monitor prescription use compliance.    Expected Outcomes  Short Term: Continued assessment and intervention until BP is < 140/941mHG in hypertensive participants. < 130/8036mG in hypertensive participants with diabetes, heart failure or chronic kidney disease.;Long Term: Maintenance of blood pressure at goal levels.    Lipids  Yes    Intervention  Provide education and support for participant on nutrition & aerobic/resistive exercise along with prescribed medications to achieve LDL <86m50mDL >40mg21m Expected Outcomes  Short Term: Participant states understanding of desired cholesterol values and is compliant with medications prescribed. Participant is following exercise prescription and nutrition guidelines.;Long Term: Cholesterol controlled with medications as prescribed, with individualized exercise RX and with personalized nutrition plan. Value goals: LDL < 86mg,22m > 40 mg.        Personal Goals Discharge: Goals and Risk Factor Review - 03/27/18 0915      Core  Components/Risk Factors/Patient Goals Review   Personal Goals Review  Weight Management/Obesity;Hypertension;Lipids    Review  Herby Nyxonnues to do well in rehab.  He is was down to 177 lbs today!!  He continues to monitor his blood pressures and is doing well on medications.     Expected Outcomes  Short: Continue to work on weigh loss.  Long: Continue to monitor risk factors       Exercise Goals and Review: Exercise Goals    Row Name 02/05/18 1439             Exercise Goals   Increase Physical Activity  Yes       Intervention  Provide advice, education, support and counseling about physical activity/exercise needs.;Develop an individualized exercise prescription for aerobic and resistive training based on initial evaluation findings, risk stratification, comorbidities and participant's personal goals.       Expected Outcomes  Short Term: Attend rehab on a regular basis to increase amount of physical activity.;Long Term: Add in home exercise to make exercise part of routine and to increase amount of physical activity.;Long Term: Exercising regularly at least 3-5 days a week.  Increase Strength and Stamina  Yes       Intervention  Provide advice, education, support and counseling about physical activity/exercise needs.;Develop an individualized exercise prescription for aerobic and resistive training based on initial evaluation findings, risk stratification, comorbidities and participant's personal goals.       Expected Outcomes  Short Term: Increase workloads from initial exercise prescription for resistance, speed, and METs.;Short Term: Perform resistance training exercises routinely during rehab and add in resistance training at home;Long Term: Improve cardiorespiratory fitness, muscular endurance and strength as measured by increased METs and functional capacity (6MWT)       Able to understand and use rate of perceived exertion (RPE) scale  Yes       Intervention  Provide education  and explanation on how to use RPE scale       Expected Outcomes  Short Term: Able to use RPE daily in rehab to express subjective intensity level;Long Term:  Able to use RPE to guide intensity level when exercising independently       Knowledge and understanding of Target Heart Rate Range (THRR)  Yes       Intervention  Provide education and explanation of THRR including how the numbers were predicted and where they are located for reference       Expected Outcomes  Short Term: Able to state/look up THRR;Long Term: Able to use THRR to govern intensity when exercising independently;Short Term: Able to use daily as guideline for intensity in rehab       Able to check pulse independently  Yes       Intervention  Provide education and demonstration on how to check pulse in carotid and radial arteries.;Review the importance of being able to check your own pulse for safety during independent exercise       Expected Outcomes  Short Term: Able to explain why pulse checking is important during independent exercise;Long Term: Able to check pulse independently and accurately       Understanding of Exercise Prescription  Yes       Intervention  Provide education, explanation, and written materials on patient's individual exercise prescription       Expected Outcomes  Short Term: Able to explain program exercise prescription;Long Term: Able to explain home exercise prescription to exercise independently          Nutrition & Weight - Outcomes: Pre Biometrics - 02/05/18 1440      Pre Biometrics   Height  5' 8.75" (1.746 m)    Weight  180 lb (81.6 kg)    Waist Circumference  37 inches    Hip Circumference  38.5 inches    Waist to Hip Ratio  0.96 %    BMI (Calculated)  26.78    Single Leg Stand  2.79 seconds      Post Biometrics - 03/27/18 0858       Post  Biometrics   Height  5' 8.75" (1.746 m)    Weight  177 lb (80.3 kg)    Waist Circumference  36 inches    Hip Circumference  38 inches    Waist to  Hip Ratio  0.95 %    BMI (Calculated)  26.34    Single Leg Stand  4.99 seconds       Nutrition: Nutrition Therapy & Goals - 02/15/18 1142      Nutrition Therapy   Diet  TLC    Drug/Food Interactions  Statins/Certain Fruits    Fruits and Vegetables  8 servings/day      Sodium  1500 grams      Personal Nutrition Goals   Nutrition Goal  Continue with current healthy eating pattern    Comments  Mr. Moch has been limiting sources of saturated fat, and limiting sodium intake; he and his wife report reading food labels. He eats vegetables and fruits regularly as well as beans, nuts, and fish.        Nutrition Discharge: Nutrition Assessments - 04/03/18 1132      MEDFICTS Scores   Pre Score  34    Post Score  22    Score Difference  -12       Education Questionnaire Score: Knowledge Questionnaire Score - 04/03/18 1132      Knowledge Questionnaire Score   Pre Score  23/28    Post Score  24/28       Goals reviewed with patient; copy given to patient.

## 2018-04-10 DIAGNOSIS — Z955 Presence of coronary angioplasty implant and graft: Secondary | ICD-10-CM

## 2018-04-10 DIAGNOSIS — I213 ST elevation (STEMI) myocardial infarction of unspecified site: Secondary | ICD-10-CM

## 2018-04-10 DIAGNOSIS — Z48812 Encounter for surgical aftercare following surgery on the circulatory system: Secondary | ICD-10-CM | POA: Diagnosis not present

## 2018-04-10 NOTE — Progress Notes (Signed)
Daily Session Note  Patient Details  Name: Barry [name] MRN: [id number] Date of Birth: 1946-03-31 Referring Provider:     Cardiac Rehab from 02/05/2018 in [facility]  Referring Provider  End, Harrell Gave MD      Encounter Date: 04/10/2018  Check In: Session Check In - 04/10/18 0854      Check-In   Location  [facility]-Cardiac & Pulmonary Rehab    Staff Present  Heath Lark, RN, BSN, CCRP;Sedona Wenk, BA, ACSM CEP, Exercise Physiologist;Jessica Luan [place], [place], RCEP, CCRP, Exercise Physiologist    Supervising physician immediately available to respond to emergencies  See telemetry face sheet for immediately available ER MD    Medication changes reported      No    Fall or balance concerns reported     No    Warm-up and Cool-down  Performed on first and last piece of equipment    Resistance Training Performed  Yes    VAD Patient?  No    PAD/SET Patient?  No      Pain Assessment   Currently in Pain?  No/denies    Multiple Pain Sites  No          Social History   Tobacco Use  Smoking Status Never Smoker  Smokeless Tobacco Former Systems developer  . Types: Chew    Goals Met:  Independence with exercise equipment Exercise tolerated well No report of cardiac concerns or symptoms Strength training completed today  Goals Unmet:  Not Applicable  Comments: Barry [name] graduated today from  rehab with 36 sessions completed.  Details of the patient's exercise prescription and what he needs to do in order to continue the prescription and progress were discussed with patient.  Patient was given a copy of prescription and goals.  Patient verbalized understanding.  Barry [name] plans to continue to exercise by walking.   Dr. Emily Filbert is Medical Director for [name].

## 2018-04-28 NOTE — Progress Notes (Signed)
Cardiology Office Note  Date:  04/30/2018   ID:  Barry [name], Barry [name] 09-01-1946, MRN [id number]  PCP:  Olin Hauser, DO   Chief Complaint  Patient presents with  . other    3 month follow up. Meds reviewed by the pt. verbally. "doing well."     HPI:  72 year old gentleman with history of  hypertension,  hyperlipidemia,  arthritis,  Anxiety  Chronic right knee pain,  Diabetes type 2, HBA1C 6.4 who presents for follow-up of his coronary disease,  STEMI, proximal to mid RCA April 2019  In follow-up today he has completed heart tract Lots of questions, Wants to garden,  Very anxious concerning how much exercise to do Concerned about residual blockages Wants to go outside and work but wife is restricting him Reports having dry mouth dry skin dry eyes, everything dry  Hemoglobin A1c 6.4 Previously was drinking lots of soda  Paroxysmal atrial fibrillation noted prior to PCI  Echocardiogram reviewed with him in detail - Left ventricle: The cavity size was normal. Wall thickness was   increased in a pattern of mild LVH. Systolic function was mildly   reduced. The estimated ejection fraction was in the range of 45%   to 50%. There is hypokinesis of the basal-midinferolateral and   inferior myocardium. Left ventricular diastolic function   parameters were normal for the patient&'s age. - Aortic valve: There was trivial regurgitation. - Right ventricle: The cavity size was normal. Systolic function   was mildly to moderately reduced.  EKG personally reviewed by myself on todays visit Shows Normal sinus rhythm rate 65 bpm T wave abnormality V4 through V6 inferior leads  Of past medical history reviewed January 16, 2018 chest pain, d/c on 01/18/2018 Cardiac catheterization performed inferior STEMI thrombotic occlusion to the proximal RCA moderate nonobstructive coronary disease of LAD and left circumflex Proximal to mid LAD is aneurysmal Drug-eluting stent placed to  proximal to mid RCA 3.0 x 38 mm  Peak troponin 54 on January 17, 2018  Previous side effects to various medications Amlodipine, he developed swelling HCTZ, reported having dry skin, dry mouth Lisinopril, developed a cough Atenolol: did not work Coreg: ok so far Previous dose was 12.5 twice daily This was decreased down to 3.125 when he left the hospital  Family history of diabetes, MI Mom with CVA Dad with alzheimers  PMH:   has a past medical history of Arthritis, BPH (benign prostatic hypertrophy), GERD (gastroesophageal reflux disease), Hyperlipidemia, and Hypertension.  PSH:    Past Surgical History:  Procedure Laterality Date  . CATARACT EXTRACTION W/PHACO Right 04/12/2016   Procedure: CATARACT EXTRACTION PHACO AND INTRAOCULAR LENS PLACEMENT (IOC);  Surgeon: Birder Robson, MD;  Location: [facility] ORS;  Service: Ophthalmology;  Laterality: Right;  [place]' 01:05 AP% 26.6 CDE 17.48 fluid pack lot # [id number]  . CORONARY/GRAFT ACUTE MI REVASCULARIZATION N/A 01/16/2018   Procedure: Coronary/Graft Acute MI Revascularization;  Surgeon: Nelva Bush, MD;  Location: [place] [facility];  Service: Cardiovascular;  Laterality: N/A;  . KNEE SURGERY Right 09/21/2012  . LEFT HEART CATH AND CORONARY ANGIOGRAPHY N/A 01/16/2018   Procedure: LEFT HEART CATH AND CORONARY ANGIOGRAPHY;  Surgeon: Nelva Bush, MD;  Location: [place] [facility];  Service: Cardiovascular;  Laterality: N/A;    Current Outpatient Medications  Medication Sig Dispense Refill  . aspirin 81 MG tablet Take 81 mg by mouth 2 (two) times daily.    Marland Kitchen atorvastatin (LIPITOR) 80 MG tablet Take 1 tablet (80 mg total) by mouth  daily at 6 PM. 90 tablet 3  . carvedilol (COREG) 6.25 MG tablet Take 1 tablet (6.25 mg total) by mouth 2 (two) times daily. 180 tablet 3  . loratadine (CLARITIN) 10 MG tablet Take 1 tablet (10 mg total) by mouth as needed.    Marland Kitchen losartan (COZAAR) 25 MG tablet Take 1 tablet (25 mg total) by mouth daily. 90  tablet 3  . Multiple Vitamin (MULTIVITAMIN WITH MINERALS) TABS tablet Take 1 tablet by mouth every other day.    . ranitidine (ZANTAC) 150 MG capsule Take 150 mg by mouth daily as needed.     . tamsulosin (FLOMAX) 0.4 MG CAPS capsule Take 1 capsule (0.4 mg total) by mouth daily. 30 capsule 5  . ticagrelor (BRILINTA) 90 MG TABS tablet Take 1 tablet (90 mg total) by mouth 2 (two) times daily. 180 tablet 3   No current facility-administered medications for this visit.      Allergies:   Lisinopril   Social History:  The patient  reports that he has never smoked. He has quit using smokeless tobacco. His smokeless tobacco use included chew. He reports that he does not drink alcohol or use drugs.   Family History:   family history includes Diabetes in his mother; Hyperlipidemia in his paternal aunt; Melanoma in his brother; Prostate cancer in his brother; Stroke in his mother.    Review of Systems: Review of Systems  Constitutional: Negative.   Respiratory: Negative.   Cardiovascular: Negative.   Gastrointestinal: Negative.   Musculoskeletal: Positive for joint pain.  Neurological: Negative.   Psychiatric/Behavioral: The patient is nervous/anxious.   All other systems reviewed and are negative.    PHYSICAL EXAM: VS:  BP 130/70 (BP Location: Left Arm, Patient Position: Sitting, Cuff Size: Normal)   Pulse 65   Ht 5\' 9"  (1.753 m)   Wt 176 lb 12 oz (80.2 kg)   BMI 26.10 kg/m  , BMI Body mass index is 26.1 kg/m.  No significant change in exam Constitutional:  oriented to person, place, and time. No distress.  HENT:  Head: Normocephalic and atraumatic.  Eyes:  no discharge. No scleral icterus.  Neck: Normal range of motion. Neck supple. No JVD present. , 1+ carotid bruit left Cardiovascular: Normal rate, regular rhythm, normal heart sounds and intact distal pulses. Exam reveals no gallop and no friction rub. No edema No murmur heard. Pulmonary/Chest: Effort normal and breath sounds  normal. No stridor. No respiratory distress.  no wheezes.  no rales.  no tenderness.  Abdominal: Soft.  no distension.  no tenderness.  Musculoskeletal: Normal range of motion.  no  tenderness or deformity.  Neurological:  normal muscle tone. Coordination normal. No atrophy Skin: Skin is warm and dry. No rash noted. not diaphoretic.  Psychiatric:  normal mood and affect. behavior is normal. Thought content normal.    Recent Labs: 01/16/2018: ALT 29 01/17/2018: Hemoglobin 13.0; Magnesium 2.0; Platelets 206 01/18/2018: BUN 18; Creatinine, Ser 0.91; Potassium 3.6; Sodium 141    Lipid Panel Lab Results  Component Value Date   CHOL 142 01/16/2018   HDL 39 (L) 01/16/2018   LDLCALC 77 01/16/2018   TRIG 129 01/16/2018      Wt Readings from Last 3 Encounters:  04/30/18 176 lb 12 oz (80.2 kg)  03/27/18 177 lb (80.3 kg)  02/05/18 180 lb (81.6 kg)       ASSESSMENT AND PLAN:  Essential (primary) hypertension - Blood pressure is well controlled on today's visit. No changes made to  the medications. stable  Pure hypercholesterolemia -  Repeat lipid panel with primary care later this year Goal LDL less than 70  Diabetes mellitus due to underlying condition with hyperosmolarity without coma, without long-term current use of insulin ([place]) Long discussion concerning need to change his diet, stop drinking Pepsi and sweet tea He does eat lots of bread Hemoglobin A1c 6.4 which is elevated Again discussed with him Recommended regular exercise program, now with an exercise bike  CAD/STEMI presented April 2019 with STEMI occluded proximal RCA nonobstructive LAD disease, circumflex disease Again we have gone over his cardiac catheterization and echocardiogram findings Very anxious about his health Reassurance provided  Numerous questions by both patient and wife concerning previous records medications previous testing, additional testing needed, limitations on activities  Total encounter time  more than 45 minutes  Greater than 50% was spent in counseling and coordination of care with the patient  Disposition:   F/U  12 months   Orders Placed This Encounter  Procedures  . EKG 12-Lead     Signed, Esmond Plants, M.D., Ph.D. 04/30/2018  [place], [place]

## 2018-04-30 ENCOUNTER — Encounter: Payer: Self-pay | Admitting: Cardiovascular Disease

## 2018-04-30 ENCOUNTER — Ambulatory Visit (INDEPENDENT_AMBULATORY_CARE_PROVIDER_SITE_OTHER): Payer: PPO | Admitting: Cardiovascular Disease

## 2018-04-30 VITALS — BP 130/70 | HR 65 | Ht 69.0 in | Wt 176.8 lb

## 2018-04-30 DIAGNOSIS — E119 Type 2 diabetes mellitus without complications: Secondary | ICD-10-CM

## 2018-04-30 DIAGNOSIS — I480 Paroxysmal atrial fibrillation: Secondary | ICD-10-CM | POA: Diagnosis not present

## 2018-04-30 DIAGNOSIS — I25118 Atherosclerotic heart disease of native coronary artery with other forms of angina pectoris: Secondary | ICD-10-CM

## 2018-04-30 DIAGNOSIS — I255 Ischemic cardiomyopathy: Secondary | ICD-10-CM

## 2018-04-30 DIAGNOSIS — E7800 Pure hypercholesterolemia, unspecified: Secondary | ICD-10-CM

## 2018-04-30 DIAGNOSIS — I10 Essential (primary) hypertension: Secondary | ICD-10-CM

## 2018-04-30 NOTE — Patient Instructions (Addendum)
Medication Instructions:   Decrease aspirin to 81 mg- take 1 tablet by mouth once daily  Brilinta 90 mg samples given today: Lot: [id number] Exp: 3/22 # 3- bottles  Labwork:  No new labs needed  Testing/Procedures:  No further testing at this time   Follow-Up: It was a pleasure seeing you in the office today. Please call us if you have new issues that need to be addressed before your next appt.  [phone]  Your physician wants you to follow-up in: 12 months.  You will receive a reminder letter in the mail two months in advance. If you don't receive a letter, please call our office to schedule the follow-up appointment.  If you need a refill on your cardiac medications before your next appointment, please call your pharmacy.  For educational health videos Log in to : [email or web address] Or : [email or web address], password : [email or web address]

## 2018-07-03 ENCOUNTER — Other Ambulatory Visit: Payer: PPO

## 2018-07-03 DIAGNOSIS — I10 Essential (primary) hypertension: Secondary | ICD-10-CM

## 2018-07-03 DIAGNOSIS — Z0000 Encounter for general adult medical examination without abnormal findings: Secondary | ICD-10-CM

## 2018-07-03 DIAGNOSIS — R350 Frequency of micturition: Principal | ICD-10-CM

## 2018-07-03 DIAGNOSIS — Z125 Encounter for screening for malignant neoplasm of prostate: Secondary | ICD-10-CM

## 2018-07-03 DIAGNOSIS — E119 Type 2 diabetes mellitus without complications: Secondary | ICD-10-CM | POA: Diagnosis not present

## 2018-07-03 DIAGNOSIS — N401 Enlarged prostate with lower urinary tract symptoms: Secondary | ICD-10-CM

## 2018-07-03 DIAGNOSIS — E7800 Pure hypercholesterolemia, unspecified: Secondary | ICD-10-CM | POA: Diagnosis not present

## 2018-07-04 LAB — COMPLETE METABOLIC PANEL WITH GFR
AG Ratio: 2.5 (calc) (ref 1.0–2.5)
ALBUMIN MSPROF: 4.5 g/dL (ref 3.6–5.1)
ALT: 18 U/L (ref 9–46)
AST: 18 U/L (ref 10–35)
Alkaline phosphatase (APISO): 82 U/L (ref 40–115)
BUN: 14 mg/dL (ref 7–25)
CO2: 27 mmol/L (ref 20–32)
CREATININE: 0.88 mg/dL (ref 0.70–1.18)
Calcium: 9 mg/dL (ref 8.6–10.3)
Chloride: 106 mmol/L (ref 98–110)
GFR, Est African American: 99 mL/min/{1.73_m2} (ref >=60)
GFR, Est Non African American: 86 mL/min/{1.73_m2} (ref >=60)
GLUCOSE: 118 mg/dL — AB (ref 65–99)
Globulin: 1.8 g/dL (calc) — ABNORMAL LOW (ref 1.9–3.7)
Potassium: 4 mmol/L (ref 3.5–5.3)
Sodium: 140 mmol/L (ref 135–146)
TOTAL PROTEIN: 6.3 g/dL (ref 6.1–8.1)
Total Bilirubin: 0.7 mg/dL (ref 0.2–1.2)

## 2018-07-04 LAB — CBC WITH DIFFERENTIAL/PLATELET
BASOS PCT: 0.6 %
Basophils Absolute: 40 cells/uL (ref 0–200)
EOS ABS: 178 {cells}/uL (ref 15–500)
EOS PCT: 2.7 %
HCT: 39.4 % (ref 38.5–50.0)
HEMOGLOBIN: 13.6 g/dL (ref 13.2–17.1)
Lymphs Abs: 1584 cells/uL (ref 850–3900)
MCH: 30.9 pg (ref 27.0–33.0)
MCHC: 34.5 g/dL (ref 32.0–36.0)
MCV: 89.5 fL (ref 80.0–100.0)
MONOS PCT: 8.6 %
MPV: 9.5 fL (ref 7.5–12.5)
NEUTROS ABS: 4231 {cells}/uL (ref 1500–7800)
Neutrophils Relative %: 64.1 %
Platelets: 203 10*3/uL (ref 140–400)
RBC: 4.4 10*6/uL (ref 4.20–5.80)
RDW: 12.4 % (ref 11.0–15.0)
Total Lymphocyte: 24 %
WBC mixed population: 568 cells/uL (ref 200–950)
WBC: 6.6 10*3/uL (ref 3.8–10.8)

## 2018-07-04 LAB — PSA, TOTAL WITH REFLEX TO PSA, FREE: PSA, Total: 0.4 ng/mL (ref <=4.0)

## 2018-07-04 LAB — LIPID PANEL
CHOL/HDL RATIO: 3.1 (calc) (ref <5.0)
CHOLESTEROL: 122 mg/dL (ref <200)
HDL: 40 mg/dL — ABNORMAL LOW (ref >40)
LDL CHOLESTEROL (CALC): 65 mg/dL
Non-HDL Cholesterol (Calc): 82 mg/dL (calc) (ref <130)
Triglycerides: 88 mg/dL (ref <150)

## 2018-07-04 LAB — HEMOGLOBIN A1C
EAG (MMOL/L): 7.7 (calc)
Hgb A1c MFr Bld: 6.5 % of total Hgb — ABNORMAL HIGH (ref <5.7)
MEAN PLASMA GLUCOSE: 140 (calc)

## 2018-07-05 ENCOUNTER — Encounter: Payer: Self-pay | Admitting: Family Medicine

## 2018-07-10 ENCOUNTER — Ambulatory Visit (INDEPENDENT_AMBULATORY_CARE_PROVIDER_SITE_OTHER): Payer: PPO | Admitting: Family Medicine

## 2018-07-10 ENCOUNTER — Encounter: Payer: Self-pay | Admitting: Family Medicine

## 2018-07-10 VITALS — BP 134/80 | HR 64 | Temp 97.9°F | Resp 16 | Ht 69.0 in | Wt 179.0 lb

## 2018-07-10 DIAGNOSIS — R350 Frequency of micturition: Secondary | ICD-10-CM

## 2018-07-10 DIAGNOSIS — I480 Paroxysmal atrial fibrillation: Secondary | ICD-10-CM

## 2018-07-10 DIAGNOSIS — I10 Essential (primary) hypertension: Secondary | ICD-10-CM | POA: Diagnosis not present

## 2018-07-10 DIAGNOSIS — N401 Enlarged prostate with lower urinary tract symptoms: Secondary | ICD-10-CM | POA: Diagnosis not present

## 2018-07-10 DIAGNOSIS — Z0000 Encounter for general adult medical examination without abnormal findings: Secondary | ICD-10-CM

## 2018-07-10 DIAGNOSIS — E1169 Type 2 diabetes mellitus with other specified complication: Secondary | ICD-10-CM | POA: Diagnosis not present

## 2018-07-10 DIAGNOSIS — E785 Hyperlipidemia, unspecified: Secondary | ICD-10-CM

## 2018-07-10 MED ORDER — TAMSULOSIN HCL 0.4 MG PO CAPS: 0.4000 mg | ORAL_CAPSULE | Freq: Every day | ORAL | 3 refills | Status: DC

## 2018-07-10 NOTE — Assessment & Plan Note (Signed)
Stable, without evidence of active AFib Followed by Cardiology On anti-platelet brilinta and aspirin On rate control

## 2018-07-10 NOTE — Assessment & Plan Note (Signed)
Controlled cholesterol on statin and lifestyle Last lipid panel 06/2018 Known CAD  Plan: 1. Continue current meds - Atorvastating 80mg  daily 2. Continue DAPT - Brilinta / ASA 81mg  for primary ASCVD risk reduction 3. Encourage improved lifestyle - low carb/cholesterol, reduce portion size, continue improving regular exercise

## 2018-07-10 NOTE — Patient Instructions (Addendum)
Thank you for coming to the office today.  Please schedule and return for a NURSE ONLY VISIT for VACCINE - Approximately around October 2019 - Need High Dose Flu Vaccine  Please have Eye Doctor send [place] a copy of your Diabetic Eye Report  Refilled Flomax  Use vaseline to help prevent nose bleeds  1. Chemistry - Normal results, including electrolytes, kidney and liver function. - Slightly elevated fasting blood sugar due to diabetes.   2. Hemoglobin A1c (Diabetes) - 6.5, stable from prior 6.4  3. PSA Prostate Cancer Screening - 0.4, negative.  4. CBC Blood Counts - Normal, no anemia, no other significant abnormality  5. Cholesterol - Normal cholesterol results. Stable HDL (good cholesterol), low normal controlled LDL (bad cholesterol), and low normal Triglycerides. Controlled on Atorvastatin 80mg  medication.  Prostate is enlarged, without nodule or abnormality.  Please schedule a Follow-up Appointment to: Return in about 6 months (around 01/08/2019) for 6 mo f/u DM A1c, HTN, BPH.  If you have any other questions or concerns, please feel free to call the office or send a message through [place]. You may also schedule an earlier appointment if necessary.  Additionally, you may be receiving a survey about your experience at our office within a few days to 1 week by e-mail or mail. We value your feedback.  Nobie Putnam, DO [place]

## 2018-07-10 NOTE — Assessment & Plan Note (Signed)
Well-controlled DM with A1c 6.5 at goal, stable from prior No known complications or hypoglycemia. Complications - other including hyperlipidemia and vascular disease CAD, GERD - increases risk of future cardiovascular complications   Plan:  1. No DM medication required. Diet controlled 2. Encourage improved lifestyle - low carb, low sugar diet, reduce portion size, continue improving regular exercise 3. Check CBG , bring log to next visit for review 4. Continue ASA, ARB, Statin 5. DM Foot exam done today / Advised keep scheduled DM ophtho exam, send record - keep apt 07/2018 6. Follow-up 6 months DM A1c

## 2018-07-10 NOTE — Assessment & Plan Note (Signed)
Stable to improved chronic BPH with lower urinary tract symptoms (LUTS) without any evidence of obstruction. - AUA BPH score 8 (from 10) - On Tamsulosin 0.4mg  daily now, instead of Rapaflo 8mg  / Silodosin - Last PSA 0.4 (06/2018) - Last DRE mild enlarged prostate, unremarkable (today 07/10/18) - No known personal/family history of prostate CA  Plan: 1. Continue Tamsulosin 0.4mg  daily for BPH LUTS - caution sudden position change dizziness side effect 2. Yearly PSA and optional DRE Follow-up

## 2018-07-10 NOTE — Progress Notes (Signed)
Subjective:    Patient ID: Barry [name], male    DOB: 1946/04/03, 72 y.o.   MRN: [id number]  Barry [name] is a 72 y.o. male presenting on 07/10/2018 for Annual Exam   HPI   Here for Annual Physical and Lab Review.  GERD Taking Ranitidine 150mg  BID. He was told it was made with a "cancer causing agent" and was told it may be off market for now with new Zantac recall. He still has medicine. Not interested in changing at this time.  CHRONIC DM, Type 2: A1c stable from prior, 6.5 now from 6.4 last time Meds:None - diet controlled Currentlyon Losartan Lifestyle: - Weight up 2-3 lbs in past few months - Diet (improved DM diet still improving) - Exercise (active, walking but now less but using recumbent bike) - Followed by [facility], next visit eye exam in 07/2018, will request record Denies hypoglycemia  CHRONIC HTN: Reportsno new concerns. Home BP readings 120-130s/60-70s Current Meds -Carvedilol 6.25mg  BID, Losartan 25mg  daily Reports good compliance, took meds today. Tolerating well, w/o complaints.  CAD, history of STEMI / PAF / HLD Followed by Cardiology Recently in 04/2018, Dr Rockey Situ updated his anti-platelet treatment to Brilinta 90mg  BID and Aspirin 81mg  daily now, no longer on Aspirin BID - Admits occasional epistaxis 1-2x a week, he is using vaseline with improvement. Not using other nasal spray or other triggers - Taking Atorvastatin 80mg  daily, lipid panel controlled, without myalgias   BPH with LUTS/ Prostate Cancer Screening - Previously followed by [facility] - Dr Bernardo Heater, last visit 11/2016, has had normal screening for prostate cancer in past including DRE and PSA in 2018. - Last result from our office PSA 0.4 (06/2018) - He is doing well on Flomax 0.4mg  daily, he does admit some occasional postural dizziness, as discussed with his cardiologist. No new concerns - Prior treatments including Rapaflo, Silodosin - Stays well hydrated with plenty of  water  Repeat score today  AUA BPH Symptom Score over past 1 month 1. Sensation of not emptying bladder post void -0 2. Urinate less than 2 hour after finish last void -1 3. Start/Stop several times during void -1 4. Difficult to postpone urination -2 5. Weak urinary stream -2 6. Push or strain urination -0 7. Nocturia -2times  Last score 01/01/18 - 10 (moderate) - on Rapaflo Total Score: 8 (Moderate BPH symptoms) - on Flomax 0.4mg   Health Maintenance:  Due for Flu Shot, will return when in stock   Depression screen [facility] 2/9 04/03/2018 02/05/2018 01/01/2018  Decreased Interest 0 0 0  Down, Depressed, Hopeless 0 0 0  PHQ - 2 Score 0 0 0  Altered sleeping 1 0 -  Tired, decreased energy 1 0 -  Change in appetite 1 0 -  Feeling bad or failure about yourself  0 0 -  Trouble concentrating 1 0 -  Moving slowly or fidgety/restless 1 0 -  Suicidal thoughts 0 0 -  PHQ-9 Score 5 0 -  Difficult doing work/chores Somewhat difficult Not difficult at all -    Past Medical History:  Diagnosis Date  . Arthritis   . BPH (benign prostatic hypertrophy)   . GERD (gastroesophageal reflux disease)    Past Surgical History:  Procedure Laterality Date  . CATARACT EXTRACTION W/PHACO Right 04/12/2016   Procedure: CATARACT EXTRACTION PHACO AND INTRAOCULAR LENS PLACEMENT (IOC);  Surgeon: Birder Robson, MD;  Location: [facility] ORS;  Service: Ophthalmology;  Laterality: Right;  [place]' 01:05 AP% 26.6 CDE  17.48 fluid pack lot # [id number]  . CORONARY/GRAFT ACUTE MI REVASCULARIZATION N/A 01/16/2018   Procedure: Coronary/Graft Acute MI Revascularization;  Surgeon: Nelva Bush, MD;  Location: [place] [facility];  Service: Cardiovascular;  Laterality: N/A;  . KNEE SURGERY Right 09/21/2012  . LEFT HEART CATH AND CORONARY ANGIOGRAPHY N/A 01/16/2018   Procedure: LEFT HEART CATH AND CORONARY ANGIOGRAPHY;  Surgeon: Nelva Bush, MD;  Location: [place] [facility];  Service: Cardiovascular;   Laterality: N/A;   Social History   Socioeconomic History  . Marital status: Married    Spouse name: Not on file  . Number of children: Not on file  . Years of education: Not on file  . Highest education level: Not on file  Occupational History  . Not on file  Social Needs  . Financial resource strain: Not on file  . Food insecurity:    Worry: Not on file    Inability: Not on file  . Transportation needs:    Medical: Not on file    Non-medical: Not on file  Tobacco Use  . Smoking status: Never Smoker  . Smokeless tobacco: Former Systems developer    Types: Chew  Substance and Sexual Activity  . Alcohol use: No  . Drug use: No  . Sexual activity: Not on file  Lifestyle  . Physical activity:    Days per week: Not on file    Minutes per session: Not on file  . Stress: Not on file  Relationships  . Social connections:    Talks on phone: Not on file    Gets together: Not on file    Attends religious service: Not on file    Active member of club or organization: Not on file    Attends meetings of clubs or organizations: Not on file    Relationship status: Not on file  . Intimate partner violence:    Fear of current or ex partner: Not on file    Emotionally abused: Not on file    Physically abused: Not on file    Forced sexual activity: Not on file  Other Topics Concern  . Not on file  Social History Narrative  . Not on file   Family History  Problem Relation Age of Onset  . Diabetes Mother   . Stroke Mother   . Prostate cancer Brother   . Melanoma Brother   . Hyperlipidemia Paternal Aunt    Current Outpatient Medications on File Prior to Visit  Medication Sig  . aspirin 81 MG tablet Take 1 tablet (81 mg total) by mouth daily.  Marland Kitchen atorvastatin (LIPITOR) 80 MG tablet Take 1 tablet (80 mg total) by mouth daily at 6 PM.  . carvedilol (COREG) 6.25 MG tablet Take 1 tablet (6.25 mg total) by mouth 2 (two) times daily.  Marland Kitchen loratadine (CLARITIN) 10 MG tablet Take 1 tablet (10 mg  total) by mouth as needed.  . Multiple Vitamin (MULTIVITAMIN WITH MINERALS) TABS tablet Take 1 tablet by mouth every other day.  . ranitidine (ZANTAC) 150 MG capsule Take 150 mg by mouth daily as needed.   . ticagrelor (BRILINTA) 90 MG TABS tablet Take 1 tablet (90 mg total) by mouth 2 (two) times daily.  Marland Kitchen losartan (COZAAR) 25 MG tablet Take 1 tablet (25 mg total) by mouth daily.   No current facility-administered medications on file prior to visit.     Review of Systems  Constitutional: Negative for activity change, appetite change, chills, diaphoresis, fatigue and fever.  HENT:  Negative for congestion and hearing loss.   Eyes: Negative for visual disturbance.  Respiratory: Negative for apnea, cough, choking, chest tightness, shortness of breath and wheezing.   Cardiovascular: Negative for chest pain, palpitations and leg swelling.  Gastrointestinal: Negative for abdominal pain, anal bleeding, blood in stool, constipation, diarrhea, nausea and vomiting.  Endocrine: Negative for cold intolerance.  Genitourinary: Negative for difficulty urinating, dysuria, frequency and hematuria.  Musculoskeletal: Negative for arthralgias and neck pain.  Skin: Negative for rash.  Allergic/Immunologic: Negative for environmental allergies.  Neurological: Negative for dizziness, weakness, light-headedness, numbness and headaches.  Hematological: Negative for adenopathy.  Psychiatric/Behavioral: Negative for behavioral problems, dysphoric mood and sleep disturbance.   Per HPI unless specifically indicated above     Objective:    BP 134/80 (BP Location: Left Arm, Cuff Size: Normal)   Pulse 64   Temp 97.9 F (36.6 C) (Oral)   Resp 16   Ht 5\' 9"  (1.753 m)   Wt 179 lb (81.2 kg)   BMI 26.43 kg/m   Wt Readings from Last 3 Encounters:  07/10/18 179 lb (81.2 kg)  04/30/18 176 lb 12 oz (80.2 kg)  03/27/18 177 lb (80.3 kg)    Physical Exam  Constitutional: He is oriented to person, place, and time.  He appears well-developed and well-nourished. No distress.  Well-appearing, comfortable, cooperative  HENT:  Head: Normocephalic and atraumatic.  Mouth/Throat: Oropharynx is clear and moist.  Frontal / maxillary sinuses non-tender. Nares patent without purulence or edema. Bilateral TMs clear without erythema, effusion or bulging. Oropharynx clear without erythema, exudates, edema or asymmetry.  Eyes: Pupils are equal, round, and reactive to light. Conjunctivae and EOM are normal. Right eye exhibits no discharge. Left eye exhibits no discharge.  Neck: Normal range of motion. Neck supple. No thyromegaly present.  Cardiovascular: Normal rate, regular rhythm, normal heart sounds and intact distal pulses.  No murmur heard. Pulmonary/Chest: Effort normal and breath sounds normal. No respiratory distress. He has no wheezes. He has no rales.  Abdominal: Soft. Bowel sounds are normal. He exhibits no distension and no mass. There is no tenderness.  Genitourinary:  Genitourinary Comments: Rectal/DRE: Normal external exam without hemorrhoids fissures or abnormality. DRE with palpation of mild to moderately enlarged prostate smooth symmetrical without nodule or tenderness.  Musculoskeletal: Normal range of motion. He exhibits no edema or tenderness.  Upper / Lower Extremities: - Normal muscle tone, strength bilateral upper extremities 5/5, lower extremities 5/5  Lymphadenopathy:    He has no cervical adenopathy.  Neurological: He is alert and oriented to person, place, and time.  Distal sensation intact to light touch all extremities  Skin: Skin is warm and dry. No rash noted. He is not diaphoretic. No erythema.  Psychiatric: He has a normal mood and affect. His behavior is normal.  Well groomed, good eye contact, normal speech and thoughts  Nursing note and vitals reviewed.    Diabetic Foot Exam - Simple   Simple Foot Form Diabetic Foot exam was performed with the following findings:  Yes 07/10/2018  10:40 AM  Visual Inspection No deformities, no ulcerations, no other skin breakdown bilaterally:  Yes Sensation Testing Intact to touch and monofilament testing bilaterally:  Yes Pulse Check Posterior Tibialis and Dorsalis pulse intact bilaterally:  Yes Comments     Results for orders placed or performed in visit on 07/03/18  PSA, Total with Reflex to PSA, Free  Result Value Ref Range   PSA, Total 0.4 < OR = 4.0 ng/mL  Hemoglobin A1c  Result Value Ref Range   Hgb A1c MFr Bld 6.5 (H) <5.7 % of total Hgb   Mean Plasma Glucose 140 (calc)   eAG (mmol/L) 7.7 (calc)  CBC with Differential/Platelet  Result Value Ref Range   WBC 6.6 3.8 - 10.8 Thousand/uL   RBC 4.40 4.20 - 5.80 Million/uL   Hemoglobin 13.6 13.2 - 17.1 g/dL   HCT 39.4 38.5 - 50.0 %   MCV 89.5 80.0 - 100.0 fL   MCH 30.9 27.0 - 33.0 pg   MCHC 34.5 32.0 - 36.0 g/dL   RDW 12.4 11.0 - 15.0 %   Platelets 203 140 - 400 Thousand/uL   MPV 9.5 7.5 - 12.5 fL   Neutro Abs 4,231 1,500 - 7,800 cells/uL   Lymphs Abs 1,584 850 - 3,900 cells/uL   WBC mixed population 568 200 - 950 cells/uL   Eosinophils Absolute 178 15 - 500 cells/uL   Basophils Absolute 40 0 - 200 cells/uL   Neutrophils Relative % 64.1 %   Total Lymphocyte 24.0 %   Monocytes Relative 8.6 %   Eosinophils Relative 2.7 %   Basophils Relative 0.6 %  COMPLETE METABOLIC PANEL WITH GFR  Result Value Ref Range   Glucose, Bld 118 (H) 65 - 99 mg/dL   BUN 14 7 - 25 mg/dL   Creat 0.88 0.70 - 1.18 mg/dL   GFR, Est Non African American 86 > OR = 60 mL/min/1.62m2   GFR, Est African American 99 > OR = 60 mL/min/1.25m2   BUN/Creatinine Ratio NOT APPLICABLE 6 - 22 (calc)   Sodium 140 135 - 146 mmol/L   Potassium 4.0 3.5 - 5.3 mmol/L   Chloride 106 98 - 110 mmol/L   CO2 27 20 - 32 mmol/L   Calcium 9.0 8.6 - 10.3 mg/dL   Total Protein 6.3 6.1 - 8.1 g/dL   Albumin 4.5 3.6 - 5.1 g/dL   Globulin 1.8 (L) 1.9 - 3.7 g/dL (calc)   AG Ratio 2.5 1.0 - 2.5 (calc)   Total  Bilirubin 0.7 0.2 - 1.2 mg/dL   Alkaline phosphatase (APISO) 82 40 - 115 U/L   AST 18 10 - 35 U/L   ALT 18 9 - 46 U/L  Lipid panel  Result Value Ref Range   Cholesterol 122 <200 mg/dL   HDL 40 (L) >40 mg/dL   Triglycerides 88 <150 mg/dL   LDL Cholesterol (Calc) 65 mg/dL (calc)   Total CHOL/HDL Ratio 3.1 <5.0 (calc)   Non-HDL Cholesterol (Calc) 82 <130 mg/dL (calc)      Assessment & Plan:   Problem List Items Addressed This Visit    Benign prostatic hyperplasia with lower urinary tract symptoms    Stable to improved chronic BPH with lower urinary tract symptoms (LUTS) without any evidence of obstruction. - AUA BPH score 8 (from 10) - On Tamsulosin 0.4mg  daily now, instead of Rapaflo 8mg  / Silodosin - Last PSA 0.4 (06/2018) - Last DRE mild enlarged prostate, unremarkable (today 07/10/18) - No known personal/family history of prostate CA  Plan: 1. Continue Tamsulosin 0.4mg  daily for BPH LUTS - caution sudden position change dizziness side effect 2. Yearly PSA and optional DRE Follow-up      Relevant Medications   tamsulosin (FLOMAX) 0.4 MG CAPS capsule   Essential (primary) hypertension    Mild elevated SBP, but improved manual re-check No known complications  Plan: 1. Continue Carvedilol higher dose 6.25mg  BID -  indication with s/p MI - Continue on Losartan 25mg  daily - Encouraged  low sodium diet, regular exercise Monitor BP Follow-up as planned      Hyperlipidemia associated with type 2 diabetes mellitus ([place])    Controlled cholesterol on statin and lifestyle Last lipid panel 06/2018 Known CAD  Plan: 1. Continue current meds - Atorvastating 80mg  daily 2. Continue DAPT - Brilinta / ASA 81mg  for primary ASCVD risk reduction 3. Encourage improved lifestyle - low carb/cholesterol, reduce portion size, continue improving regular exercise      Paroxysmal atrial fibrillation (HCC)    Stable, without evidence of active AFib Followed by Cardiology On anti-platelet  brilinta and aspirin On rate control      Type 2 diabetes mellitus with other specified complication ([place])    Well-controlled DM with A1c 6.5 at goal, stable from prior No known complications or hypoglycemia. Complications - other including hyperlipidemia and vascular disease CAD, GERD - increases risk of future cardiovascular complications   Plan:  1. No DM medication required. Diet controlled 2. Encourage improved lifestyle - low carb, low sugar diet, reduce portion size, continue improving regular exercise 3. Check CBG , bring log to next visit for review 4. Continue ASA, ARB, Statin 5. DM Foot exam done today / Advised keep scheduled DM ophtho exam, send record - keep apt 07/2018 6. Follow-up 6 months DM A1c        Other Visit Diagnoses    Annual physical exam    -  Primary      Updated Health Maintenance information - Return flu shot Reviewed recent lab results with patient Encouraged improvement to lifestyle with diet and exercise   Meds ordered this encounter  Medications  . tamsulosin (FLOMAX) 0.4 MG CAPS capsule    Sig: Take 1 capsule (0.4 mg total) by mouth daily.    Dispense:  90 capsule    Refill:  3    Follow up plan: Return in about 6 months (around 01/08/2019) for 6 mo f/u DM A1c, HTN, BPH.  Nobie Putnam, [facility] 07/10/2018, 1:34 PM

## 2018-07-10 NOTE — Assessment & Plan Note (Signed)
Mild elevated SBP, but improved manual re-check No known complications  Plan: 1. Continue Carvedilol higher dose 6.25mg  BID -  indication with s/p MI - Continue on Losartan 25mg  daily - Encouraged low sodium diet, regular exercise Monitor BP Follow-up as planned

## 2018-07-17 ENCOUNTER — Ambulatory Visit: Payer: PPO

## 2018-07-25 ENCOUNTER — Encounter: Payer: Self-pay | Admitting: Family Medicine

## 2018-07-25 ENCOUNTER — Ambulatory Visit (INDEPENDENT_AMBULATORY_CARE_PROVIDER_SITE_OTHER): Payer: PPO | Admitting: Family Medicine

## 2018-07-25 VITALS — BP 137/76 | HR 70 | Temp 98.4°F | Resp 16 | Ht 69.0 in | Wt 178.0 lb

## 2018-07-25 DIAGNOSIS — J0110 Acute frontal sinusitis, unspecified: Secondary | ICD-10-CM | POA: Diagnosis not present

## 2018-07-25 MED ORDER — FLUTICASONE PROPIONATE 50 MCG/ACT NA SUSP: 2.0000 | Freq: Every day | NASAL | 0 refills | Status: DC

## 2018-07-25 MED ORDER — AMOXICILLIN-POT CLAVULANATE 875-125 MG PO TABS
1.0000 | ORAL_TABLET | Freq: Two times a day (BID) | ORAL | 0 refills | Status: DC
Start: 2018-07-25 — End: 2018-08-21

## 2018-07-25 MED ORDER — BENZONATATE 100 MG PO CAPS: 100.0000 mg | ORAL_CAPSULE | Freq: Three times a day (TID) | ORAL | 0 refills | Status: DC | PRN

## 2018-07-25 NOTE — Patient Instructions (Addendum)
Thank you for coming to the office today.  1. It sounds like you have a Sinusitis (Bacterial Infection) - this most likely started as an Upper Respiratory Virus that has settled into an infection. Allergies can also cause this. - Start Augmentin 1 pill twice daily (breakfast and dinner, with food and plenty of water) for 10 days, complete entire course, do not stop early even if feeling better - Start Flonase 2 sprays in each nostril daily for next 4-6 weeks, then you may stop and use seasonally or as needed - Recommend to keep using Nasal Saline spray multiple times a day to help flush out congestion and clear sinuses - Can use Vaseline to protect nose to avoid bleed - May take OTC Mucinex (or may try Mucinex-DM for cough) up to 7-10 days then stop - Improve hydration by drinking plenty of clear fluids (water, gatorade) to reduce secretions and thin congestion - Congestion draining down throat can cause irritation. May try warm herbal tea with honey, cough drops - Can take Tylenol or Ibuprofen as needed for fevers  If you develop persistent fever >101F for at least 3 consecutive days, headaches with sinus pain or pressure or persistent earache, please schedule a follow-up evaluation within next few days to week.   Please schedule a Follow-up Appointment to: Return in about 1 week (around 08/01/2018), or if symptoms worsen or fail to improve, for sinusitis.  If you have any other questions or concerns, please feel free to call the office or send a message through [place]. You may also schedule an earlier appointment if necessary.  Additionally, you may be receiving a survey about your experience at our office within a few days to 1 week by e-mail or mail. We value your feedback.  Nobie Putnam, DO [place]

## 2018-07-25 NOTE — Progress Notes (Signed)
Subjective:    Patient ID: Barry [name], male    DOB: 1945-11-12, 72 y.o.   MRN: [id number]  Barry [name] is a 72 y.o. male presenting on 07/25/2018 for Cough (greenish mucus, can't sleep with cough onset week )   HPI  Acute Sinusitis Reports symptoms started about 1 week ago with scratchy throat and runny nose, non productive cough, he tried OTC Mucinex-DM, then progressed to worsening cough with thicker productive sputum yellow/green. Recent contact daughter had sinusitis / AOM and treated with antibiotic - that was extended. - Has been a while since last URI bronchitis - Admits some epistaxis after blowing nose, some dry nasal - Admits occasional diarrhea - Denies any fever, chills, nausea, vomiting, abdominal pain, rash, muscle aches  Health Maintenance: Due for Flu Shot, defer today due to current illness   Depression screen [facility] 2/9 07/25/2018 04/03/2018 02/05/2018  Decreased Interest 0 0 0  Down, Depressed, Hopeless 0 0 0  PHQ - 2 Score 0 0 0  Altered sleeping - 1 0  Tired, decreased energy - 1 0  Change in appetite - 1 0  Feeling bad or failure about yourself  - 0 0  Trouble concentrating - 1 0  Moving slowly or fidgety/restless - 1 0  Suicidal thoughts - 0 0  PHQ-9 Score - 5 0  Difficult doing work/chores - Somewhat difficult Not difficult at all    Social History   Tobacco Use  . Smoking status: Never Smoker  . Smokeless tobacco: Former Systems developer    Types: Chew  Substance Use Topics  . Alcohol use: No  . Drug use: No    Review of Systems Per HPI unless specifically indicated above     Objective:    BP 137/76   Pulse 70   Temp 98.4 F (36.9 C)   Resp 16   Ht 5\' 9"  (1.753 m)   Wt 178 lb (80.7 kg)   SpO2 97%   BMI 26.29 kg/m   Wt Readings from Last 3 Encounters:  07/25/18 178 lb (80.7 kg)  07/10/18 179 lb (81.2 kg)  04/30/18 176 lb 12 oz (80.2 kg)    Physical Exam  Constitutional: He is oriented to person, place, and time. He appears  well-developed and well-nourished. No distress.  Mostly well but slightly tired appearing, comfortable, cooperative  HENT:  Head: Normocephalic and atraumatic.  Mouth/Throat: Oropharynx is clear and moist.  Frontal sinuses mild-tender. Nares with turbinate edema and congestion without purulence. Bilateral TMs obscured by cerumen. Oropharynx clear without erythema, exudates, edema or asymmetry.  Eyes: Conjunctivae are normal. Right eye exhibits no discharge. Left eye exhibits no discharge.  Neck: Normal range of motion. Neck supple. No thyromegaly present.  Cardiovascular: Normal rate, regular rhythm, normal heart sounds and intact distal pulses.  No murmur heard. Pulmonary/Chest: Effort normal and breath sounds normal. No respiratory distress. He has no wheezes. He has no rales.  Occasional cough. No focal abnormal sounds.  Musculoskeletal: Normal range of motion. He exhibits no edema.  Lymphadenopathy:    He has no cervical adenopathy.  Neurological: He is alert and oriented to person, place, and time.  Skin: Skin is warm and dry. No rash noted. He is not diaphoretic. No erythema.  Psychiatric: He has a normal mood and affect. His behavior is normal.  Well groomed, good eye contact, normal speech and thoughts  Nursing note and vitals reviewed.  Results for orders placed or performed in visit on 07/03/18  PSA, Total  with Reflex to PSA, Free  Result Value Ref Range   PSA, Total 0.4 < OR = 4.0 ng/mL  Hemoglobin A1c  Result Value Ref Range   Hgb A1c MFr Bld 6.5 (H) <5.7 % of total Hgb   Mean Plasma Glucose 140 (calc)   eAG (mmol/L) 7.7 (calc)  CBC with Differential/Platelet  Result Value Ref Range   WBC 6.6 3.8 - 10.8 Thousand/uL   RBC 4.40 4.20 - 5.80 Million/uL   Hemoglobin 13.6 13.2 - 17.1 g/dL   HCT 39.4 38.5 - 50.0 %   MCV 89.5 80.0 - 100.0 fL   MCH 30.9 27.0 - 33.0 pg   MCHC 34.5 32.0 - 36.0 g/dL   RDW 12.4 11.0 - 15.0 %   Platelets 203 140 - 400 Thousand/uL   MPV 9.5 7.5  - 12.5 fL   Neutro Abs 4,231 1,500 - 7,800 cells/uL   Lymphs Abs 1,584 850 - 3,900 cells/uL   WBC mixed population 568 200 - 950 cells/uL   Eosinophils Absolute 178 15 - 500 cells/uL   Basophils Absolute 40 0 - 200 cells/uL   Neutrophils Relative % 64.1 %   Total Lymphocyte 24.0 %   Monocytes Relative 8.6 %   Eosinophils Relative 2.7 %   Basophils Relative 0.6 %  COMPLETE METABOLIC PANEL WITH GFR  Result Value Ref Range   Glucose, Bld 118 (H) 65 - 99 mg/dL   BUN 14 7 - 25 mg/dL   Creat 0.88 0.70 - 1.18 mg/dL   GFR, Est Non African American 86 > OR = 60 mL/min/1.52m2   GFR, Est African American 99 > OR = 60 mL/min/1.77m2   BUN/Creatinine Ratio NOT APPLICABLE 6 - 22 (calc)   Sodium 140 135 - 146 mmol/L   Potassium 4.0 3.5 - 5.3 mmol/L   Chloride 106 98 - 110 mmol/L   CO2 27 20 - 32 mmol/L   Calcium 9.0 8.6 - 10.3 mg/dL   Total Protein 6.3 6.1 - 8.1 g/dL   Albumin 4.5 3.6 - 5.1 g/dL   Globulin 1.8 (L) 1.9 - 3.7 g/dL (calc)   AG Ratio 2.5 1.0 - 2.5 (calc)   Total Bilirubin 0.7 0.2 - 1.2 mg/dL   Alkaline phosphatase (APISO) 82 40 - 115 U/L   AST 18 10 - 35 U/L   ALT 18 9 - 46 U/L  Lipid panel  Result Value Ref Range   Cholesterol 122 <200 mg/dL   HDL 40 (L) >40 mg/dL   Triglycerides 88 <150 mg/dL   LDL Cholesterol (Calc) 65 mg/dL (calc)   Total CHOL/HDL Ratio 3.1 <5.0 (calc)   Non-HDL Cholesterol (Calc) 82 <130 mg/dL (calc)      Assessment & Plan:   Problem List Items Addressed This Visit    None    Visit Diagnoses    Acute non-recurrent frontal sinusitis    -  Primary   Relevant Medications   amoxicillin-clavulanate (AUGMENTIN) 875-125 MG tablet   benzonatate (TESSALON) 100 MG capsule   fluticasone (FLONASE) 50 MCG/ACT nasal spray      Consistent with acute frontal rhinosinusitis, likely initially viral URI vs allergic rhinitis component with worsening concern for bacterial infection with second sickening, sick contact  Plan: 1. Start Augmentin 875-125mg  PO BID x  10 days 2. Start nasal steroid Flonase 2 sprays in each nostril daily for 4-6 weeks, may repeat course seasonally or as needed - use vaseline and nasal saline avoid epistaxis dry nose 3. Start Tessalon Perls take 1 capsule up  to 3 times a day as needed for cough 4. May try OTC Mucinex (or may try Mucinex-DM for cough) up to 7-10 days then stop 5. Return criteria reviewed   Meds ordered this encounter  Medications  . amoxicillin-clavulanate (AUGMENTIN) 875-125 MG tablet    Sig: Take 1 tablet by mouth 2 (two) times daily. For 10 days    Dispense:  20 tablet    Refill:  0  . benzonatate (TESSALON) 100 MG capsule    Sig: Take 1 capsule (100 mg total) by mouth 3 (three) times daily as needed for cough.    Dispense:  30 capsule    Refill:  0  . fluticasone (FLONASE) 50 MCG/ACT nasal spray    Sig: Place 2 sprays into both nostrils daily. Use for 4-6 weeks then stop and use seasonally or as needed.    Dispense:  16 g    Refill:  0     Follow up plan: Return in about 1 week (around 08/01/2018), or if symptoms worsen or fail to improve, for sinusitis.  Nobie Putnam, [facility] 07/25/2018, 1:15 PM

## 2018-07-31 ENCOUNTER — Ambulatory Visit: Payer: PPO

## 2018-07-31 DIAGNOSIS — X32XXXA Exposure to sunlight, initial encounter: Secondary | ICD-10-CM | POA: Diagnosis not present

## 2018-07-31 DIAGNOSIS — D2261 Melanocytic nevi of right upper limb, including shoulder: Secondary | ICD-10-CM | POA: Diagnosis not present

## 2018-07-31 DIAGNOSIS — L821 Other seborrheic keratosis: Secondary | ICD-10-CM | POA: Diagnosis not present

## 2018-07-31 DIAGNOSIS — D225 Melanocytic nevi of trunk: Secondary | ICD-10-CM | POA: Diagnosis not present

## 2018-07-31 DIAGNOSIS — D2271 Melanocytic nevi of right lower limb, including hip: Secondary | ICD-10-CM | POA: Diagnosis not present

## 2018-07-31 DIAGNOSIS — D2262 Melanocytic nevi of left upper limb, including shoulder: Secondary | ICD-10-CM | POA: Diagnosis not present

## 2018-07-31 DIAGNOSIS — L718 Other rosacea: Secondary | ICD-10-CM | POA: Diagnosis not present

## 2018-07-31 DIAGNOSIS — L570 Actinic keratosis: Secondary | ICD-10-CM | POA: Diagnosis not present

## 2018-07-31 DIAGNOSIS — D2272 Melanocytic nevi of left lower limb, including hip: Secondary | ICD-10-CM | POA: Diagnosis not present

## 2018-08-14 ENCOUNTER — Ambulatory Visit (INDEPENDENT_AMBULATORY_CARE_PROVIDER_SITE_OTHER): Payer: PPO

## 2018-08-14 VITALS — BP 140/80 | HR 72 | Resp 14 | Ht 69.0 in | Wt 177.0 lb

## 2018-08-14 DIAGNOSIS — Z0000 Encounter for general adult medical examination without abnormal findings: Secondary | ICD-10-CM

## 2018-08-14 DIAGNOSIS — Z1159 Encounter for screening for other viral diseases: Secondary | ICD-10-CM

## 2018-08-14 DIAGNOSIS — Z23 Encounter for immunization: Secondary | ICD-10-CM

## 2018-08-14 NOTE — Progress Notes (Signed)
Subjective:   Barry [name] is a 72 y.o. male who presents for Medicare Annual/Subsequent preventive examination.  Review of Systems:   Cardiac Risk Factors include: advanced age (>24men, >67 women);hypertension;dyslipidemia;male gender     Objective:    Vitals: BP 140/80   Pulse 72   Resp 14   Ht 5\' 9"  (1.753 m)   Wt 177 lb (80.3 kg)   BMI 26.14 kg/m   Body mass index is 26.14 kg/m.  Advanced Directives 08/14/2018 02/05/2018 01/16/2018 06/27/2017 04/12/2016  Does Patient Have a Medical Advance Directive? No No No No No  Would patient like information on creating a medical advance directive? No - Patient declined No - Patient declined No - Patient declined No - Patient declined No - patient declined information    Tobacco Social History   Tobacco Use  Smoking Status Never Smoker  Smokeless Tobacco Former Systems developer  . Types: Chew     Counseling given: Not Answered   Clinical Intake:  Pre-visit preparation completed: Yes  Pain : 0-10 Pain Score: 4  Pain Type: Chronic pain Pain Location: Knee Pain Orientation: Right Pain Descriptors / Indicators: Aching Pain Onset: More than a month ago Pain Frequency: Intermittent     Nutritional Status: BMI 25 -29 Overweight Nutritional Risks: None Diabetes: No CBG done?: No Did pt. bring in CBG monitor from home?: No  How often do you need to have someone help you when you read instructions, pamphlets, or other written materials from your doctor or pharmacy?: 1 - Never What is the last grade level you completed in school?: 12th grade  Interpreter Needed?: No  Information entered by :: Clemetine Marker LPN  Past Medical History:  Diagnosis Date  . Arthritis   . BPH (benign prostatic hypertrophy)   . GERD (gastroesophageal reflux disease)    Past Surgical History:  Procedure Laterality Date  . CATARACT EXTRACTION W/PHACO Right 04/12/2016   Procedure: CATARACT EXTRACTION PHACO AND INTRAOCULAR LENS PLACEMENT (IOC);  Surgeon:  Birder Robson, MD;  Location: [facility] ORS;  Service: Ophthalmology;  Laterality: Right;  [place]' 01:05 AP% 26.6 CDE 17.48 fluid pack lot # [id number]  . CORONARY/GRAFT ACUTE MI REVASCULARIZATION N/A 01/16/2018   Procedure: Coronary/Graft Acute MI Revascularization;  Surgeon: Nelva Bush, MD;  Location: [facility];  Service: Cardiovascular;  Laterality: N/A;  . KNEE SURGERY Right 09/21/2012  . LEFT HEART CATH AND CORONARY ANGIOGRAPHY N/A 01/16/2018   Procedure: LEFT HEART CATH AND CORONARY ANGIOGRAPHY;  Surgeon: Nelva Bush, MD;  Location: [place] [facility];  Service: Cardiovascular;  Laterality: N/A;   Family History  Problem Relation Age of Onset  . Diabetes Mother   . Stroke Mother   . Prostate cancer Brother   . Melanoma Brother   . Hyperlipidemia Paternal Aunt    Social History   Socioeconomic History  . Marital status: Married    Spouse name: Not on file  . Number of children: Not on file  . Years of education: 12th grade  . Highest education level: High school graduate  Occupational History  . Occupation: retired  Scientific laboratory technician  . Financial resource strain: Not hard at all  . Food insecurity:    Worry: Never true    Inability: Never true  . Transportation needs:    Medical: No    Non-medical: No  Tobacco Use  . Smoking status: Never Smoker  . Smokeless tobacco: Former Systems developer    Types: Chew  Substance and Sexual Activity  . Alcohol use:  No  . Drug use: No  . Sexual activity: Not on file  Lifestyle  . Physical activity:    Days per week: 5 days    Minutes per session: 30 min  . Stress: Patient refused  Relationships  . Social connections:    Talks on phone: More than three times a week    Gets together: More than three times a week    Attends religious service: More than 4 times per year    Active member of club or organization: No    Attends meetings of clubs or organizations: Never    Relationship status: Married  Other Topics Concern  . Not  on file  Social History Narrative  . Not on file    Outpatient Encounter Medications as of 08/14/2018  Medication Sig  . aspirin 81 MG tablet Take 1 tablet (81 mg total) by mouth daily.  Marland Kitchen atorvastatin (LIPITOR) 80 MG tablet Take 1 tablet (80 mg total) by mouth daily at 6 PM.  . carvedilol (COREG) 6.25 MG tablet Take 1 tablet (6.25 mg total) by mouth 2 (two) times daily.  . fluticasone (FLONASE) 50 MCG/ACT nasal spray Place 2 sprays into both nostrils daily. Use for 4-6 weeks then stop and use seasonally or as needed.  . metroNIDAZOLE (METROGEL) 1 % gel   . ticagrelor (BRILINTA) 90 MG TABS tablet Take 1 tablet (90 mg total) by mouth 2 (two) times daily.  Marland Kitchen amoxicillin-clavulanate (AUGMENTIN) 875-125 MG tablet Take 1 tablet by mouth 2 (two) times daily. For 10 days (Patient not taking: Reported on 08/14/2018)  . benzonatate (TESSALON) 100 MG capsule Take 1 capsule (100 mg total) by mouth 3 (three) times daily as needed for cough. (Patient not taking: Reported on 08/14/2018)  . losartan (COZAAR) 25 MG tablet Take 1 tablet (25 mg total) by mouth daily.  . Multiple Vitamin (MULTIVITAMIN WITH MINERALS) TABS tablet Take 1 tablet by mouth every other day.  . ranitidine (ZANTAC) 150 MG capsule Take 150 mg by mouth daily as needed.   . tamsulosin (FLOMAX) 0.4 MG CAPS capsule Take 1 capsule (0.4 mg total) by mouth daily. (Patient not taking: Reported on 08/14/2018)   No facility-administered encounter medications on file as of 08/14/2018.     Activities of Daily Living In your present state of health, do you have any difficulty performing the following activities: 08/14/2018 01/16/2018  Hearing? N -  Comment declines hearing aids -  Vision? N -  Comment wears glasses -  Difficulty concentrating or making decisions? N -  Walking or climbing stairs? N -  Dressing or bathing? N -  Doing errands, shopping? N N  Preparing Food and eating ? N -  Using the Toilet? N -  In the past six months, have  you accidently leaked urine? N -  Do you have problems with loss of bowel control? N -  Managing your Medications? N -  Managing your Finances? N -  Housekeeping or managing your Housekeeping? N -  Some recent data might be hidden    Patient Care Team: Olin Hauser, DO as PCP - General (Family Medicine) Minna Merritts, MD as Consulting Physician (Cardiology)   Assessment:   This is a routine wellness examination for Ivan.  Exercise Activities and Dietary recommendations Current Exercise Habits: Home exercise routine, Type of exercise: Other - see comments(recumbent bike), Time (Minutes): 30, Frequency (Times/Week): 5, Weekly Exercise (Minutes/Week): 150, Intensity: Mild, Exercise limited by: cardiac condition(s);orthopedic condition(s)  Goals    .  Exercise 150 min/wk Moderate Activity     Pt purchased stationary bicycle after cardiac rehab. Continue using 3-5 times per week for 30 minutes.    . Increase physical activity     Current activity is walking occasionally- limited due to knee.  Suggestions for walking- walking laps in a pool and suggested [employer].        Fall Risk Fall Risk  08/14/2018 07/25/2018 02/05/2018 01/01/2018 06/27/2017  Falls in the past year? No No No No No  Number falls in past yr: - - - - -  Injury with Fall? - - - - -  Risk for fall due to : - - - - -  Follow up - - - - -   [place]:  Any stairs in or around the home WITH handrails? No  Home free of loose throw rugs in walkways, pet beds, electrical cords, etc? Yes  Adequate lighting in your home to reduce risk of falls? Yes   ASSISTIVE DEVICES UTILIZED TO PREVENT FALLS:  Life alert? No  Use of a cane, walker or w/c? No  Grab bars in the bathroom? Yes  Shower chair or bench in shower? No  Elevated toilet seat or a handicapped toilet? No   DME ORDERS:  DME order needed?  No   TIMED UP AND GO:  Was the test performed? Yes .    Length of time to ambulate 10 feet: 8 sec.   GAIT:  Appearance of gait: Gait stead-fast and without the use of an assistive device. Education: Fall risk prevention has been discussed.  Intervention(s) required? No   Depression Screen PHQ 2/9 Scores 08/14/2018 07/25/2018 04/03/2018 02/05/2018  PHQ - 2 Score 0 0 0 0  PHQ- 9 Score - - 5 0    Cognitive Function MMSE - Mini Mental State Exam 05/19/2015  Orientation to time 5  Orientation to Place 5  Registration 3  Attention/ Calculation 5  Recall 3  Language- name 2 objects 2  Language- repeat 1  Language- follow 3 step command 3  Language- read & follow direction 1  Write a sentence 1  Copy design 1  Total score 30     6CIT Screen 08/14/2018 06/27/2017  What Year? 0 points 0 points  What month? 0 points 0 points  What time? 0 points 0 points  Count back from 20 0 points 0 points  Months in reverse 0 points 0 points  Repeat phrase 2 points 2 points  Total Score 2 2    Immunization History  Administered Date(s) Administered  . Influenza, High Dose Seasonal PF 07/28/2015, 08/14/2018  . Influenza,inj,quad, With Preservative 07/31/2017  . Influenza-Unspecified 06/17/2014, 07/20/2017  . Pneumococcal Conjugate-13 05/07/2014  . Pneumococcal Polysaccharide-23 10/12/2011  . Pneumococcal-Unspecified 09/17/2011  . Tdap 05/17/2008    Qualifies for Shingles Vaccine? Yes   Due for Shingrix. Education has been provided regarding the importance of this vaccine. Pt has been advised to call insurance company to determine out of pocket expense. Advised may also receive vaccine at local pharmacy or Health Dept. Verbalized acceptance and understanding. Pt states he completed Zostavax at pharmacy but no record on file. Requested patient to contact pharmacy for copy of record.  Tdap: completed 05/17/2008  Flu Vaccine: Due for Flu vaccine. Does the patient want to receive this vaccine today?  Yes    Pneumococcal Vaccine: completed  05/07/14  Screening Tests Health Maintenance  Topic Date Due  . OPHTHALMOLOGY EXAM  04/16/2017  . INFLUENZA VACCINE  08/17/2018 (Originally 05/17/2018)  . TETANUS/TDAP  07/11/2019 (Originally 05/17/2018)  . Hepatitis C Screening  07/11/2019 (Originally 05-19-1946)  . HEMOGLOBIN A1C  01/01/2019  . FOOT EXAM  07/11/2019  . COLONOSCOPY  06/29/2024  . PNA vac Low Risk Adult  Completed   Cancer Screenings:  Colorectal Screening: Completed 06/29/14. Repeat every 10 years;   Lung Cancer Screening: (Low Dose CT Chest recommended if Age 23-80 years, 30 pack-year currently smoking OR have quit w/in 15years.) does not qualify.   Additional Screening:  Hepatitis C Screening: does qualify; will place order for future lab work.  Vision Screening: Recommended annual ophthalmology exams for early detection of glaucoma and other disorders of the eye. Is the patient up to date with their annual eye exam?  Yes  Who is the provider or what is the name of the office in which the pt attends annual eye exams? [facility] Dr. Michelene Heady  Dental Screening: Recommended annual dental exams for proper oral hygiene  Community Resource Referral:  CRR required this visit?  No        Plan:    I have personally reviewed and addressed the Medicare Annual Wellness questionnaire and have noted the following in the patient's chart:  A. Medical and social history B. Use of alcohol, tobacco or illicit drugs  C. Current medications and supplements D. Functional ability and status E.  Nutritional status F.  Physical activity G. Advance directives H. List of other physicians I.  Hospitalizations, surgeries, and ER visits in previous 12 months J.  [place] such as hearing and vision if needed, cognitive and depression L. Referrals and appointments   In addition, I have reviewed and discussed with patient certain preventive protocols, quality metrics, and best practice recommendations. A written  personalized care plan for preventive services as well as general preventive health recommendations were provided to patient.   Signed,  Clemetine Marker, LPN Nurse Health Advisor   Nurse Notes: pt would like to schedule appt with Dr. Parks Ranger for ear wax removal. Pt advised to make appt at front desk and discussed debrox drops prior to flushing.

## 2018-08-14 NOTE — Patient Instructions (Signed)
Mr. Selbe , Thank you for taking time to come for your Medicare Wellness Visit. I appreciate your ongoing commitment to your health goals. Please review the following plan we discussed and let me know if I can assist you in the future.   Screening recommendations/referrals: Colonoscopy: Up to date 06/29/14 Recommended yearly ophthalmology/optometry visit for glaucoma screening and checkup Recommended yearly dental visit for hygiene and checkup  Vaccinations: Influenza vaccine: done today Pneumococcal vaccine: Up to date 05/07/14 Tdap vaccine: Up to date 05/17/2008  Shingles vaccine: Shingarix discussed, please bring a copy of your zostavax vaccine from [facility].   Advanced directives: Advance directive discussed with you today. Even though you declined this today please call our office should you change your mind and we can give you the proper paperwork for you to fill out.  Conditions/risks identified: continue mild to moderate exercise for 150 minutes per week  Next appointment: 01/09/2019 9:20 Dr. Parks Ranger  Preventive Care 15 Years and Older, Male Preventive care refers to lifestyle choices and visits with your health care provider that can promote health and wellness. What does preventive care include?  A yearly physical exam. This is also called an annual well check.  Dental exams once or twice a year.  Routine eye exams. Ask your health care provider how often you should have your eyes checked.  Personal lifestyle choices, including:  Daily care of your teeth and gums.  Regular physical activity.  Eating a healthy diet.  Avoiding tobacco and drug use.  Limiting alcohol use.  Practicing safe sex.  Taking low doses of aspirin every day.  Taking vitamin and mineral supplements as recommended by your health care provider. What happens during an annual well check? The services and screenings done by your health care provider during your annual well check will depend  on your age, overall health, lifestyle risk factors, and family history of disease. Counseling  Your health care provider may ask you questions about your:  Alcohol use.  Tobacco use.  Drug use.  Emotional well-being.  Home and relationship well-being.  Sexual activity.  Eating habits.  History of falls.  Memory and ability to understand (cognition).  Work and work Statistician. Screening  You may have the following tests or measurements:  Height, weight, and BMI.  Blood pressure.  Lipid and cholesterol levels. These may be checked every 5 years, or more frequently if you are over 39 years old.  Skin check.  Lung cancer screening. You may have this screening every year starting at age 60 if you have a 30-pack-year history of smoking and currently smoke or have quit within the past 15 years.  Fecal occult blood test (FOBT) of the stool. You may have this test every year starting at age 27.  Flexible sigmoidoscopy or colonoscopy. You may have a sigmoidoscopy every 5 years or a colonoscopy every 10 years starting at age 34.  Prostate cancer screening. Recommendations will vary depending on your family history and other risks.  Hepatitis C blood test.  Hepatitis B blood test.  Sexually transmitted disease (STD) testing.  Diabetes screening. This is done by checking your blood sugar (glucose) after you have not eaten for a while (fasting). You may have this done every 1-3 years.  Abdominal aortic aneurysm (AAA) screening. You may need this if you are a current or former smoker.  Osteoporosis. You may be screened starting at age 67 if you are at high risk. Talk with your health care provider about your test  results, treatment options, and if necessary, the need for more tests. Vaccines  Your health care provider may recommend certain vaccines, such as:  Influenza vaccine. This is recommended every year.  Tetanus, diphtheria, and acellular pertussis (Tdap, Td)  vaccine. You may need a Td booster every 10 years.  Zoster vaccine. You may need this after age 23.  Pneumococcal 13-valent conjugate (PCV13) vaccine. One dose is recommended after age 39.  Pneumococcal polysaccharide (PPSV23) vaccine. One dose is recommended after age 64. Talk to your health care provider about which screenings and vaccines you need and how often you need them. This information is not intended to replace advice given to you by your health care provider. Make sure you discuss any questions you have with your health care provider. Document Released: 10/30/2015 Document Revised: 06/22/2016 Document Reviewed: 08/04/2015 Elsevier Interactive Patient Education  2017 [place] Prevention in the Home Falls can cause injuries. They can happen to people of all ages. There are many things you can do to make your home safe and to help prevent falls. What can I do on the outside of my home?  Regularly fix the edges of walkways and driveways and fix any cracks.  Remove anything that might make you trip as you walk through a door, such as a raised step or threshold.  Trim any bushes or trees on the path to your home.  Use bright outdoor lighting.  Clear any walking paths of anything that might make someone trip, such as rocks or tools.  Regularly check to see if handrails are loose or broken. Make sure that both sides of any steps have handrails.  Any raised decks and porches should have guardrails on the edges.  Have any leaves, snow, or ice cleared regularly.  Use sand or salt on walking paths during winter.  Clean up any spills in your garage right away. This includes oil or grease spills. What can I do in the bathroom?  Use night lights.  Install grab bars by the toilet and in the tub and shower. Do not use towel bars as grab bars.  Use non-skid mats or decals in the tub or shower.  If you need to sit down in the shower, use a plastic, non-slip  stool.  Keep the floor dry. Clean up any water that spills on the floor as soon as it happens.  Remove soap buildup in the tub or shower regularly.  Attach bath mats securely with double-sided non-slip rug tape.  Do not have throw rugs and other things on the floor that can make you trip. What can I do in the bedroom?  Use night lights.  Make sure that you have a light by your bed that is easy to reach.  Do not use any sheets or blankets that are too big for your bed. They should not hang down onto the floor.  Have a firm chair that has side arms. You can use this for support while you get dressed.  Do not have throw rugs and other things on the floor that can make you trip. What can I do in the kitchen?  Clean up any spills right away.  Avoid walking on wet floors.  Keep items that you use a lot in easy-to-reach places.  If you need to reach something above you, use a strong step stool that has a grab bar.  Keep electrical cords out of the way.  Do not use floor polish or wax that makes  floors slippery. If you must use wax, use non-skid floor wax.  Do not have throw rugs and other things on the floor that can make you trip. What can I do with my stairs?  Do not leave any items on the stairs.  Make sure that there are handrails on both sides of the stairs and use them. Fix handrails that are broken or loose. Make sure that handrails are as long as the stairways.  Check any carpeting to make sure that it is firmly attached to the stairs. Fix any carpet that is loose or worn.  Avoid having throw rugs at the top or bottom of the stairs. If you do have throw rugs, attach them to the floor with carpet tape.  Make sure that you have a light switch at the top of the stairs and the bottom of the stairs. If you do not have them, ask someone to add them for you. What else can I do to help prevent falls?  Wear shoes that:  Do not have high heels.  Have rubber bottoms.  Are  comfortable and fit you well.  Are closed at the toe. Do not wear sandals.  If you use a stepladder:  Make sure that it is fully opened. Do not climb a closed stepladder.  Make sure that both sides of the stepladder are locked into place.  Ask someone to hold it for you, if possible.  Clearly mark and make sure that you can see:  Any grab bars or handrails.  First and last steps.  Where the edge of each step is.  Use tools that help you move around (mobility aids) if they are needed. These include:  Canes.  Walkers.  Scooters.  Crutches.  Turn on the lights when you go into a dark area. Replace any light bulbs as soon as they burn out.  Set up your furniture so you have a clear path. Avoid moving your furniture around.  If any of your floors are uneven, fix them.  If there are any pets around you, be aware of where they are.  Review your medicines with your doctor. Some medicines can make you feel dizzy. This can increase your chance of falling. Ask your doctor what other things that you can do to help prevent falls. This information is not intended to replace advice given to you by your health care provider. Make sure you discuss any questions you have with your health care provider. Document Released: 07/30/2009 Document Revised: 03/10/2016 Document Reviewed: 11/07/2014 Elsevier Interactive Patient Education  2017 Elsevier Inc.Influenza (Flu) Vaccine (Inactivated or Recombinant): What You Need to Know 1. Why get vaccinated? Influenza ("flu") is a contagious disease that spreads around the [place] every year, usually between October and May. Flu is caused by influenza viruses, and is spread mainly by coughing, sneezing, and close contact. Anyone can get flu. Flu strikes suddenly and can last several days. Symptoms vary by age, but can include:  fever/chills  sore throat  muscle aches  fatigue  cough  headache  runny or stuffy nose  Flu can also  lead to pneumonia and blood infections, and cause diarrhea and seizures in children. If you have a medical condition, such as heart or lung disease, flu can make it worse. Flu is more dangerous for some people. Infants and young children, people 31 years of age and older, pregnant women, and people with certain health conditions or a weakened immune system are at greatest risk. Each year  thousands of people in the [place] die from flu, and many more are hospitalized. Flu vaccine can:  keep you from getting flu,  make flu less severe if you do get it, and  keep you from spreading flu to your family and other people. 2. Inactivated and recombinant flu vaccines A dose of flu vaccine is recommended every flu season. Children 6 months through 48 years of age may need two doses during the same flu season. Everyone else needs only one dose each flu season. Some inactivated flu vaccines contain a very small amount of a mercury-based preservative called thimerosal. Studies have not shown thimerosal in vaccines to be harmful, but flu vaccines that do not contain thimerosal are available. There is no live flu virus in flu shots. They cannot cause the flu. There are many flu viruses, and they are always changing. Each year a new flu vaccine is made to protect against three or four viruses that are likely to cause disease in the upcoming flu season. But even when the vaccine doesn't exactly match these viruses, it may still provide some protection. Flu vaccine cannot prevent:  flu that is caused by a virus not covered by the vaccine, or  illnesses that look like flu but are not.  It takes about 2 weeks for protection to develop after vaccination, and protection lasts through the flu season. 3. Some people should not get this vaccine Tell the person who is giving you the vaccine:  If you have any severe, life-threatening allergies. If you ever had a life-threatening allergic reaction after a dose of  flu vaccine, or have a severe allergy to any part of this vaccine, you may be advised not to get vaccinated. Most, but not all, types of flu vaccine contain a small amount of egg protein.  If you ever had Guillain-Barr Syndrome (also called GBS). Some people with a history of GBS should not get this vaccine. This should be discussed with your doctor.  If you are not feeling well. It is usually okay to get flu vaccine when you have a mild illness, but you might be asked to come back when you feel better.  4. Risks of a vaccine reaction With any medicine, including vaccines, there is a chance of reactions. These are usually mild and go away on their own, but serious reactions are also possible. Most people who get a flu shot do not have any problems with it. Minor problems following a flu shot include:  soreness, redness, or swelling where the shot was given  hoarseness  sore, red or itchy eyes  cough  fever  aches  headache  itching  fatigue  If these problems occur, they usually begin soon after the shot and last 1 or 2 days. More serious problems following a flu shot can include the following:  There may be a small increased risk of Guillain-Barre Syndrome (GBS) after inactivated flu vaccine. This risk has been estimated at 1 or 2 additional cases per million people vaccinated. This is much lower than the risk of severe complications from flu, which can be prevented by flu vaccine.  Young children who get the flu shot along with pneumococcal vaccine (PCV13) and/or DTaP vaccine at the same time might be slightly more likely to have a seizure caused by fever. Ask your doctor for more information. Tell your doctor if a child who is getting flu vaccine has ever had a seizure.  Problems that could happen after any injected vaccine:  People sometimes faint after a medical procedure, including vaccination. Sitting or lying down for about 15 minutes can help prevent fainting, and  injuries caused by a fall. Tell your doctor if you feel dizzy, or have vision changes or ringing in the ears.  Some people get severe pain in the shoulder and have difficulty moving the arm where a shot was given. This happens very rarely.  Any medication can cause a severe allergic reaction. Such reactions from a vaccine are very rare, estimated at about 1 in a million doses, and would happen within a few minutes to a few hours after the vaccination. As with any medicine, there is a very remote chance of a vaccine causing a serious injury or death. The safety of vaccines is always being monitored. For more information, visit: http://www.aguilar.org/ 5. What if there is a serious reaction? What should I look for? Look for anything that concerns you, such as signs of a severe allergic reaction, very high fever, or unusual behavior. Signs of a severe allergic reaction can include hives, swelling of the face and throat, difficulty breathing, a fast heartbeat, dizziness, and weakness. These would start a few minutes to a few hours after the vaccination. What should I do?  If you think it is a severe allergic reaction or other emergency that can't wait, call 9-1-1 and get the person to the nearest hospital. Otherwise, call your doctor.  Reactions should be reported to the Vaccine Adverse Event Reporting System (VAERS). Your doctor should file this report, or you can do it yourself through the VAERS web site at www.vaers.SamedayNews.es, or by calling 442-140-2570. ? VAERS does not give medical advice. 6. The National Vaccine Injury Compensation Program The [employer] Vaccine Injury Compensation Program (VICP) is a federal program that was created to compensate people who may have been injured by certain vaccines. Persons who believe they may have been injured by a vaccine can learn about the program and about filing a claim by calling (516)858-2936 or visiting the [place] website at  GoldCloset.com.ee. There is a time limit to file a claim for compensation. 7. How can I learn more?  Ask your healthcare provider. He or she can give you the vaccine package insert or suggest other sources of information.  Call your local or state health department.  Contact the Centers for Disease Control and Prevention (CDC): ? Call 843-652-0210 (1-800-CDC-INFO) or ? Visit CDC's website at https://gibson.com/ Vaccine Information Statement, Inactivated Influenza Vaccine (05/23/2014) This information is not intended to replace advice given to you by your health care provider. Make sure you discuss any questions you have with your health care provider. Document Released: 07/28/2006 Document Revised: 06/23/2016 Document Reviewed: 06/23/2016 Elsevier Interactive Patient Education  2017 [employer].

## 2018-08-21 ENCOUNTER — Ambulatory Visit (INDEPENDENT_AMBULATORY_CARE_PROVIDER_SITE_OTHER): Payer: PPO | Admitting: Family Medicine

## 2018-08-21 ENCOUNTER — Encounter: Payer: Self-pay | Admitting: Family Medicine

## 2018-08-21 VITALS — BP 139/76 | HR 66 | Temp 98.2°F | Resp 16 | Ht 69.0 in | Wt 179.0 lb

## 2018-08-21 DIAGNOSIS — H6123 Impacted cerumen, bilateral: Secondary | ICD-10-CM | POA: Diagnosis not present

## 2018-08-21 NOTE — Progress Notes (Signed)
Subjective:    Patient ID: Barry [name], male    DOB: November 11, 1945, 72 y.o.   MRN: [id number]  Barry [name] is a 72 y.o. male presenting on 08/21/2018 for Cerumen Impaction   HPI   Bilateral Cerumen Impaction - Last visit with me 07/25/18, for sinsutis, treated with augmentin, flonase, see prior notes for background information. - Interval update with sinus infection has improved and resolved, now has mild lingering cough at times, and still persistent ear fullness and wax here to get wax removed  Today doing well, describes ear fullness still both sides. He has not had ears cleaned before. Does not use Q-tips. - Using OTC ear wax drops over past few days at that time with some improvement Denies any ear pain or hearing loss, ear drainage, headache or sinus pain or pressure, purulence, dyspnea  Health Maintenance: UTD FLu vaccine 08/14/18  Depression screen [facility] 2/9 08/21/2018 08/14/2018 07/25/2018  Decreased Interest 0 0 0  Down, Depressed, Hopeless 0 0 0  PHQ - 2 Score 0 0 0  Altered sleeping - - -  Tired, decreased energy - - -  Change in appetite - - -  Feeling bad or failure about yourself  - - -  Trouble concentrating - - -  Moving slowly or fidgety/restless - - -  Suicidal thoughts - - -  PHQ-9 Score - - -  Difficult doing work/chores - - -    Social History   Tobacco Use  . Smoking status: Former Research scientist (life sciences)  . Smokeless tobacco: Former Systems developer    Types: Chew    Quit date: 10/17/1993  Substance Use Topics  . Alcohol use: No  . Drug use: No    Review of Systems Per HPI unless specifically indicated above     Objective:    BP 139/76   Pulse 66   Temp 98.2 F (36.8 C) (Oral)   Resp 16   Ht 5\' 9"  (1.753 m)   Wt 179 lb (81.2 kg)   BMI 26.43 kg/m   Wt Readings from Last 3 Encounters:  08/21/18 179 lb (81.2 kg)  08/14/18 177 lb (80.3 kg)  07/25/18 178 lb (80.7 kg)    Physical Exam  Constitutional: He is oriented to person, place, and time. He appears  well-developed and well-nourished. No distress.  Well-appearing, comfortable, cooperative  HENT:  Head: Normocephalic and atraumatic.  Mouth/Throat: Oropharynx is clear and moist.  Frontal / maxillary sinuses non-tender. Nares patent without purulence or edema. Bilateral ears with obstructing thick cerumen - no view of TM.  Oropharynx clear without erythema, exudates, edema or asymmetry.  Eyes: Conjunctivae are normal. Right eye exhibits no discharge. Left eye exhibits no discharge.  Cardiovascular: Normal rate.  Pulmonary/Chest: Effort normal.  Musculoskeletal: He exhibits no edema.  Neurological: He is alert and oriented to person, place, and time.  Skin: Skin is warm and dry. No rash noted. He is not diaphoretic. No erythema.  Psychiatric: He has a normal mood and affect. His behavior is normal.  Well groomed, good eye contact, normal speech and thoughts  Nursing note and vitals reviewed.  ________________________________________________________ PROCEDURE NOTE Date: 08/21/18 Bilateral Ear Lavage / Cerumen Removal Discussed benefits and risks (including pain / discomforts, dizziness, minor abrasion of ear canal). Verbal consent given by patient. Medication:  carbamide peroxide ear drops, Ear Lavage Solution (warm water + hydrogen peroxide) Performed by Dr Parks Ranger Several drops of carbamide peroxide placed in each ear, allowed to sit for few minutes. Ear lavage  solution flushed into one ear at a time in attempt to dislodge and remove ear wax. Results were successful with complete resolution of cerumen.  Repeat Ear Exam: - Completely removed cerumen now, with clear ear canals and visible TMs clear and normal appearance.       Assessment & Plan:   Problem List Items Addressed This Visit    Cerumen impaction - Primary      No orders of the defined types were placed in this encounter.  Significant amount of large thick impacted cerumen bilateral ear, suspected primary cause  of current fullness in ears some questionable reduced hearing. Uncomplicated without pain or other concern.  Plan: 1. Successful office ear lavage cerumen removal today, re-evaluated with clear ear canals and normal TMs 2. Counseled on avoiding Q-tips and may use Kleenex as wick, use OTC Debrox as needed 3. Follow-up as needed   Follow up plan: Return if symptoms worsen or fail to improve, for ear wax.  Nobie Putnam, DO [facility] 08/21/2018, 2:20 PM

## 2018-08-21 NOTE — Patient Instructions (Addendum)
Thank you for coming to the office today.  You have thick impacted ear wax (cerumen) blocking ear canals and ear drums. This is the most likely cause of reduced hearing and ear pain and discomfort. - We were able to remove almost all of the ear wax with flushing in the office today  Recommend using the same ear drops at home in the future if ear wax builds up again, over the counter Debrox (Carbamide peroxide), use on both sides following instructions on bottle, pharmacist will direct you to the appropriate ear drops if you need help. May take a week or more.  Avoid using Q-tips inside ears, as this can push wax deeper, but you can try to use rolled up kleenex as a wick to absorb fluid and wax as well.  If you are not making progress with ear wax removal at home, or the problem keeps coming back, please notify our office or return for re-evaluation, and we can discuss referral to ENT office for more formal ear wax removal.   Your provider would like to you have your annual eye exam. Please contact your current eye doctor or here are some good options for you to contact.   [facility] [address], [place], [place] [address] Phone: [phone] [email or web address]  Please schedule a Follow-up Appointment to: Return if symptoms worsen or fail to improve, for ear wax.  If you have any other questions or concerns, please feel free to call the office or send a message through [place]. You may also schedule an earlier appointment if necessary.  Additionally, you may be receiving a survey about your experience at our office within a few days to 1 week by e-mail or mail. We value your feedback.  Nobie Putnam, DO [place]

## 2018-10-28 NOTE — Progress Notes (Signed)
Cardiology Office Note  Date:  10/29/2018   ID:  Nikki, Rusnak 15-Nov-1945, MRN [id number]  PCP:  Olin Hauser, DO   Chief Complaint  Patient presents with  . other    12 month follow up. Meds reveiwed by the pt.'s bottles. Pt. c/o shortness of breath & cold feet.     HPI:  73 year old gentleman with history of  hypertension,  hyperlipidemia,  arthritis,  Anxiety  Chronic right knee pain,  Diabetes type 2, HBA1C 6.4 who presents for follow-up of his coronary disease,  STEMI, proximal to mid RCA April 2019  Some atypical SOB, Uses the recumbent bike Exercising for a few months, since July 2019 Nervous about prior findings on catheterization, " other blockages"  Catheterization report discussed with him in detail showing 50% stenosis left circumflex and LAD  Concerned about overdoing it in the garden  Paroxysmal atrial fibrillation noted prior to PCI  EKG personally reviewed by myself on todays visit Shows normal sinus rhythm rate 70 bpm old inferior MI otherwise no significant ST or T wave changes  Other past medical history reviewed  Echocardiogram - Left ventricle: The cavity size was normal. Wall thickness was   increased in a pattern of mild LVH. Systolic function was mildly   reduced. The estimated ejection fraction was in the range of 45%   to 50%. There is hypokinesis of the basal-midinferolateral and   inferior myocardium. Left ventricular diastolic function   parameters were normal for the patient&'s age. - Aortic valve: There was trivial regurgitation. - Right ventricle: The cavity size was normal. Systolic function   was mildly to moderately reduced.  January 16, 2018 chest pain, d/c on 01/18/2018 Cardiac catheterization performed inferior STEMI thrombotic occlusion to the proximal RCA moderate nonobstructive coronary disease of LAD and left circumflex Proximal to mid LAD is aneurysmal Drug-eluting stent placed to proximal to mid RCA 3.0 x 38  mm  Peak troponin 54 on January 17, 2018  Previous side effects to various medications Amlodipine, he developed swelling HCTZ, reported having dry skin, dry mouth Lisinopril, developed a cough Atenolol: did not work Coreg: ok so far Previous dose was 12.5 twice daily This was decreased down to 3.125 when he left the hospital  Family history of diabetes, MI Mom with CVA Dad with alzheimers  PMH:   has a past medical history of Arthritis, BPH (benign prostatic hypertrophy), and GERD (gastroesophageal reflux disease).  PSH:    Past Surgical History:  Procedure Laterality Date  . CATARACT EXTRACTION W/PHACO Right 04/12/2016   Procedure: CATARACT EXTRACTION PHACO AND INTRAOCULAR LENS PLACEMENT (IOC);  Surgeon: Birder Robson, MD;  Location: [facility] OR[facility];  Service: Ophthalmology;  Laterality: Right;  [place]' 01:05 AP% 26.6 CDE 17.48 fluid pack lot # [id number]  . CORONARY/GRAFT ACUTE MI REVASCULARIZATION N/A 01/16/2018   Procedure: Coronary/Graft Acute MI Revascularization;  Surgeon: Nelva Bush, MD;  Location: [place] [facility];  Service: Cardiovascular;  Laterality: N/A;  . KNEE SURGERY Right 09/21/2012  . LEFT HEART CATH AND CORONARY ANGIOGRAPHY N/A 01/16/2018   Procedure: LEFT HEART CATH AND CORONARY ANGIOGRAPHY;  Surgeon: Nelva Bush, MD;  Location: [place] [facility];  Service: Cardiovascular;  Laterality: N/A;    Current Outpatient Medications  Medication Sig Dispense Refill  . aspirin 81 MG tablet Take 1 tablet (81 mg total) by mouth daily. 30 tablet   . atorvastatin (LIPITOR) 80 MG tablet Take 1 tablet (80 mg total) by mouth daily at 6 PM. 90 tablet 3  .  carvedilol (COREG) 6.25 MG tablet Take 1 tablet (6.25 mg total) by mouth 2 (two) times daily. 180 tablet 3  . fluticasone (FLONASE) 50 MCG/ACT nasal spray Place 2 sprays into both nostrils daily. Use for 4-6 weeks then stop and use seasonally or as needed. 16 g 0  . losartan (COZAAR) 25 MG tablet Take 1 tablet (25 mg total)  by mouth daily. 90 tablet 3  . Multiple Vitamin (MULTIVITAMIN WITH MINERALS) TABS tablet Take 1 tablet by mouth every other day.    . tamsulosin (FLOMAX) 0.4 MG CAPS capsule Take 1 capsule (0.4 mg total) by mouth daily. 90 capsule 3  . ticagrelor (BRILINTA) 90 MG TABS tablet Take 1 tablet (90 mg total) by mouth 2 (two) times daily. 180 tablet 3   No current facility-administered medications for this visit.     Allergies:   Lisinopril   Social History:  The patient  reports that he has quit smoking. He quit smokeless tobacco use about 25 years ago.  His smokeless tobacco use included chew. He reports that he does not drink alcohol or use drugs.   Family History:   family history includes Diabetes in his mother; Hyperlipidemia in his paternal aunt; Melanoma in his brother; Prostate cancer in his brother; Stroke in his mother.    Review of Systems: Review of Systems  Constitutional: Negative.   Respiratory: Positive for shortness of breath.   Cardiovascular: Negative.   Gastrointestinal: Negative.   Musculoskeletal: Negative.   Neurological: Negative.   Psychiatric/Behavioral: The patient is nervous/anxious.   All other systems reviewed and are negative.    PHYSICAL EXAM: VS:  BP 130/70 (BP Location: Left Arm, Patient Position: Sitting, Cuff Size: Normal)   Pulse 70   Ht 5\' 9"  (1.753 m)   Wt 179 lb (81.2 kg)   BMI 26.43 kg/m  , BMI Body mass index is 26.43 kg/m.  Constitutional:  oriented to person, place, and time. No distress.  HENT:  Head: Grossly normal Eyes:  no discharge. No scleral icterus.  Neck: No JVD, no carotid bruits  Cardiovascular: Regular rate and rhythm, no murmurs appreciated Pulmonary/Chest: Clear to auscultation bilaterally, no wheezes or rails Abdominal: Soft.  no distension.  no tenderness.  Musculoskeletal: Normal range of motion Neurological:  normal muscle tone. Coordination normal. No atrophy Skin: Skin warm and dry Psychiatric: normal affect,  pleasant   Recent Labs: 01/17/2018: Magnesium 2.0 07/03/2018: ALT 18; BUN 14; Creat 0.88; Hemoglobin 13.6; Platelets 203; Potassium 4.0; Sodium 140    Lipid Panel Lab Results  Component Value Date   CHOL 122 07/03/2018   HDL 40 (L) 07/03/2018   LDLCALC 65 07/03/2018   TRIG 88 07/03/2018      Wt Readings from Last 3 Encounters:  10/29/18 179 lb (81.2 kg)  08/21/18 179 lb (81.2 kg)  08/14/18 177 lb (80.3 kg)       ASSESSMENT AND PLAN:  Coronary artery disease with stable angina Prior STEMI presented April 2019 with STEMI occluded proximal RCA nonobstructive LAD disease, circumflex disease Again we have gone over his cardiac catheterization and echocardiogram findings Significant anxiety concerning his health No further testing at this time -Discussed stress testing with him if he has worsening shortness of breath or chest pain  Essential (primary) hypertension - Blood pressure stable, no medication changes made  Pure hypercholesterolemia -  Cholesterol at goal Continue current medications  Diabetes mellitus due to underlying condition with hyperosmolarity without coma, without long-term current use of insulin ([place]) Again discussed  with him, avoid sweet tea and soda Follow low carbohydrate diet    Total encounter time more than 25 minutes  Greater than 50% was spent in counseling and coordination of care with the patient  Disposition:   F/U  12 months   No orders of the defined types were placed in this encounter.    Signed, Esmond Plants, M.D., Ph.D. 10/29/2018  [place], [place]

## 2018-10-29 ENCOUNTER — Ambulatory Visit: Payer: PPO | Admitting: Cardiovascular Disease

## 2018-10-29 ENCOUNTER — Encounter: Payer: Self-pay | Admitting: Cardiovascular Disease

## 2018-10-29 VITALS — BP 130/70 | HR 70 | Ht 69.0 in | Wt 179.0 lb

## 2018-10-29 DIAGNOSIS — I25118 Atherosclerotic heart disease of native coronary artery with other forms of angina pectoris: Secondary | ICD-10-CM | POA: Diagnosis not present

## 2018-10-29 DIAGNOSIS — I10 Essential (primary) hypertension: Secondary | ICD-10-CM

## 2018-10-29 DIAGNOSIS — E7800 Pure hypercholesterolemia, unspecified: Secondary | ICD-10-CM

## 2018-10-29 DIAGNOSIS — R0989 Other specified symptoms and signs involving the circulatory and respiratory systems: Secondary | ICD-10-CM | POA: Diagnosis not present

## 2018-10-29 DIAGNOSIS — I480 Paroxysmal atrial fibrillation: Secondary | ICD-10-CM | POA: Diagnosis not present

## 2018-10-29 DIAGNOSIS — I255 Ischemic cardiomyopathy: Secondary | ICD-10-CM

## 2018-10-29 DIAGNOSIS — E119 Type 2 diabetes mellitus without complications: Secondary | ICD-10-CM

## 2018-10-29 MED ORDER — CARVEDILOL 6.25 MG PO TABS: 6.2500 mg | ORAL_TABLET | Freq: Two times a day (BID) | ORAL | 3 refills | Status: DC

## 2018-10-29 MED ORDER — TICAGRELOR 90 MG PO TABS: 90.0000 mg | ORAL_TABLET | Freq: Two times a day (BID) | ORAL | 3 refills | Status: DC

## 2018-10-29 MED ORDER — ATORVASTATIN CALCIUM 80 MG PO TABS: 80.0000 mg | ORAL_TABLET | Freq: Every day | ORAL | 3 refills | Status: DC

## 2018-10-29 MED ORDER — LOSARTAN POTASSIUM 25 MG PO TABS: 25.0000 mg | ORAL_TABLET | Freq: Every day | ORAL | 3 refills | Status: DC

## 2018-10-29 NOTE — Patient Instructions (Addendum)
Medication Instructions:  No changes  Call if you would like to change to plavix, instead of brilinta  If you need a refill on your cardiac medications before your next appointment, please call your pharmacy.    Lab work: No new labs needed   If you have labs (blood work) drawn today and your tests are completely normal, you will receive your results only by: Marland Kitchen MyChart Message (if you have MyChart) OR . A paper copy in the mail If you have any lab test that is abnormal or we need to change your treatment, we will call you to review the results.   Testing/Procedures: No new testing needed   Follow-Up: At [facility], you and your health needs are our priority.  As part of our continuing mission to provide you with exceptional heart care, we have created designated Provider Care Teams.  These Care Teams include your primary Cardiologist (physician) and Advanced Practice Providers (APPs -  Physician Assistants and Nurse Practitioners) who all work together to provide you with the care you need, when you need it.  . You will need a follow up appointment in 6 months .   Please call our office 2 months in advance to schedule this appointment.    . Providers on your designated Care Team:   . Murray Hodgkins, NP . Christell Faith, PA-C . Marrianne Mood, PA-C  Any Other Special Instructions Will Be Listed Below (If Applicable).  For educational health videos Log in to : [email or web address] Or : [email or web address], password : [email or web address]

## 2018-11-19 DIAGNOSIS — H2512 Age-related nuclear cataract, left eye: Secondary | ICD-10-CM | POA: Diagnosis not present

## 2018-11-19 LAB — HM DIABETES EYE EXAM

## 2019-01-09 ENCOUNTER — Other Ambulatory Visit: Payer: Self-pay

## 2019-01-09 ENCOUNTER — Encounter: Payer: Self-pay | Admitting: Family Medicine

## 2019-01-09 ENCOUNTER — Telehealth (INDEPENDENT_AMBULATORY_CARE_PROVIDER_SITE_OTHER): Payer: PPO | Admitting: Family Medicine

## 2019-01-09 ENCOUNTER — Other Ambulatory Visit: Payer: Self-pay | Admitting: Family Medicine

## 2019-01-09 DIAGNOSIS — N401 Enlarged prostate with lower urinary tract symptoms: Secondary | ICD-10-CM | POA: Diagnosis not present

## 2019-01-09 DIAGNOSIS — G8929 Other chronic pain: Secondary | ICD-10-CM

## 2019-01-09 DIAGNOSIS — I10 Essential (primary) hypertension: Secondary | ICD-10-CM | POA: Diagnosis not present

## 2019-01-09 DIAGNOSIS — R350 Frequency of micturition: Secondary | ICD-10-CM

## 2019-01-09 DIAGNOSIS — E1169 Type 2 diabetes mellitus with other specified complication: Secondary | ICD-10-CM | POA: Diagnosis not present

## 2019-01-09 DIAGNOSIS — Z0000 Encounter for general adult medical examination without abnormal findings: Secondary | ICD-10-CM

## 2019-01-09 DIAGNOSIS — E785 Hyperlipidemia, unspecified: Secondary | ICD-10-CM

## 2019-01-09 DIAGNOSIS — I480 Paroxysmal atrial fibrillation: Secondary | ICD-10-CM | POA: Diagnosis not present

## 2019-01-09 DIAGNOSIS — Z125 Encounter for screening for malignant neoplasm of prostate: Secondary | ICD-10-CM

## 2019-01-09 DIAGNOSIS — M25561 Pain in right knee: Secondary | ICD-10-CM

## 2019-01-09 MED ORDER — TAMSULOSIN HCL 0.4 MG PO CAPS: 0.4000 mg | ORAL_CAPSULE | Freq: Every day | ORAL | 3 refills | Status: DC

## 2019-01-09 NOTE — Assessment & Plan Note (Addendum)
Stable Followed by Cardiology On anti-platelet brilinta and aspirin - advised him that he may still have epistaxis on this regimen, limited options, in future he can discuss with cardiology on dose adjust if needed On rate control

## 2019-01-09 NOTE — Patient Instructions (Signed)
Thank you for coming to the office today.  Keep on same medicines May take an extra Tamsulosin if need - caution dizziness upon standing/changing position  Notify us if need referral to Orthopedics ([facility]) for knee  DUE for FASTING BLOOD WORK (no food or drink after midnight before the lab appointment, only water or coffee without cream/sugar on the morning of)  Lab Only" visit in the morning at the clinic for lab draw in 6 MONTHS   - Make sure Lab Only appointment is at about 1 week before your next appointment, so that results will be available  For Lab Results, once available within 2-3 days of blood draw, you can can log in to MyChart online to view your results and a brief explanation. Also, we can discuss results at next follow-up visit.   If you have any other questions or concerns, please feel free to call the office or send a message through [place]. You may also schedule an earlier appointment if necessary.  Additionally, you may be receiving a survey about your experience at our office within a few days to 1 week by e-mail or mail. We value your feedback.  Nobie Putnam, DO [place]

## 2019-01-09 NOTE — Assessment & Plan Note (Signed)
Stable to improved chronic BPH with lower urinary tract symptoms (LUTS) without any evidence of obstruction. - AUA BPH score 8 - On Tamsulosin 0.4mg  daily - Last PSA 0.4 (06/2018) - Last DRE mild enlarged prostate, unremarkable (07/10/18)  Plan: 1. Continue Tamsulosin 0.4mg  daily for BPH LUTS - caution sudden position change dizziness side effect - OFFERED he could consider x 2 pills =0.8mg  dose but it would likely cause side effect postural dizziness - he was aware and likely will not change  Check PSA in 6 months  May return to Urology in future if difficulty voiding or worse symptoms.  Also if he has UTI symptoms - can contact us for UA urine culture orders and then we can do virtual visit after result

## 2019-01-09 NOTE — Progress Notes (Signed)
Follow up for HTN, diabetes.

## 2019-01-09 NOTE — Assessment & Plan Note (Signed)
Previously Well-controlled DM with A1c 6.5 at goal No known complications or hypoglycemia. Complications - other including hyperlipidemia and vascular disease CAD, GERD - increases risk of future cardiovascular complications   Plan Defer A1c today since virtual visit and well controlled 1. No DM medication required. Diet controlled 2. Encourage improved lifestyle - low carb, low sugar diet, reduce portion size, continue improving regular exercise 3. Check CBG , bring log to next visit for review 4. Continue ASA, ARB, Statin 5. Request DM Eye from [facility] from 11/19/18 6. Follow-up 6 months yearly lab A1c

## 2019-01-09 NOTE — Progress Notes (Signed)
Virtual Visit via Telephone The purpose of this virtual visit is to provide medical care while limiting exposure to the novel coronavirus (COVID19) for both patient and office staff.  Consent was obtained for phone visit:  Yes.   Answered questions that patient had about telehealth interaction:  Yes.   I discussed the limitations, risks, security and privacy concerns of performing an evaluation and management service by telephone. I also discussed with the patient that there may be a patient responsible charge related to this service. The patient expressed understanding and agreed to proceed.  Patient Location: Home Also patient's wife - Baldo Ash was present during the phone call Provider Location: [facility])  ---------------------------------------------------------------------- CC: Follow-up Diabetes, Hypertension, BPH  S: Reviewed CMA telephone note below. I have called patient and gathered additional HPI as follows:  CHRONIC DM, Type 2: Previously A1c stable at 6.4 to 6.5 Meds:None - diet controlled Currentlyon Losartan Lifestyle: - No weight recently, prior stable 179 lbs - Diet (improved DM diet) - Exercise (active, walking, less recently) - Followed by [facility], last visit 11/19/18 - will request record Denies hypoglycemia  CHRONIC HTN: Reportsno new concerns. Home BP readings 120-138 / 60-75 Current Meds -Carvedilol 6.25mg  BID, Losartan 25mg  daily Reports good compliance, took meds today. Tolerating well, w/o complaints. Admits mild stable lightheaded upon standing at times, episodic- chronic problem on medication Denies CP, dyspnea, HA, edema, dizziness  CAD, history of STEMI / PAF / HLD Followed by Cardiology, last visit 10/29/18 with Dr Rockey Situ, advised return in 1 year, he was kept on Brilinta 90mg  BID and Aspirin 81mg  daily - still epistaxis PRN. Denies any chest pain, dyspnea, anginal symptoms, palpitations  Right Knee Pain,  Arthritis Chronic episodic R knee pain from arthritis and history of torn meniscus on MRI 2013. In future Summer/Fall he may return to [facility] for knee pain by his request, he will let us know if need referral.   BPH with LUTS/ Prostate Cancer Screening No longer followed by [facility] Dr Bernardo Heater Last PSA 0.4 (06/2018) Due for repeat in September 2020 - He is doing well on Flomax 0.4mg  daily, he does admit some occasional postural dizziness - Asking if he can take 2 - Admits some concentrated urine at times stronger odor but no UTI symptoms - Prior treatments including Rapaflo, Silodosin Denies hematuria, dysuria, abdominal pain or flank pain  UNCHANGED AUA BPH Symptom Score over past 1 month 1. Sensation of not emptying bladder post void -0 2. Urinate less than 2 hour after finish last void -1 3. Start/Stop several times during void -1 4. Difficult to postpone urination -2 5. Weak urinary stream -2 6. Push or strain urination -0 7. Nocturia -2times  Total Score: 8 (Moderate BPH symptoms) - on Flomax 0.4mg   Denies any fevers, chills, sweats, body ache, cough, shortness of breath, sinus pain or pressure, headache, abdominal pain, diarrhea  Past Medical History:  Diagnosis Date  . Arthritis   . BPH (benign prostatic hypertrophy)   . GERD (gastroesophageal reflux disease)    Social History   Tobacco Use  . Smoking status: Former Research scientist (life sciences)  . Smokeless tobacco: Former Systems developer    Types: Chew    Quit date: 10/17/1993  Substance Use Topics  . Alcohol use: No  . Drug use: No    Current Outpatient Medications:  .  aspirin 81 MG tablet, Take 1 tablet (81 mg total) by mouth daily., Disp: 30 tablet, Rfl:  .  atorvastatin (LIPITOR) 80  MG tablet, Take 1 tablet (80 mg total) by mouth daily at 6 PM., Disp: 90 tablet, Rfl: 3 .  carvedilol (COREG) 6.25 MG tablet, Take 1 tablet (6.25 mg total) by mouth 2 (two) times daily., Disp: 180 tablet, Rfl: 3 .  fluticasone (FLONASE) 50  MCG/ACT nasal spray, Place 2 sprays into both nostrils daily. Use for 4-6 weeks then stop and use seasonally or as needed., Disp: 16 g, Rfl: 0 .  losartan (COZAAR) 25 MG tablet, Take 1 tablet (25 mg total) by mouth daily., Disp: 90 tablet, Rfl: 3 .  tamsulosin (FLOMAX) 0.4 MG CAPS capsule, Take 1 capsule (0.4 mg total) by mouth daily., Disp: 90 capsule, Rfl: 3 .  ticagrelor (BRILINTA) 90 MG TABS tablet, Take 1 tablet (90 mg total) by mouth 2 (two) times daily., Disp: 180 tablet, Rfl: 3 .  Multiple Vitamin (MULTIVITAMIN WITH MINERALS) TABS tablet, Take 1 tablet by mouth every other day., Disp: , Rfl:   Depression screen [facility] 2/9 01/09/2019 08/21/2018 08/14/2018  Decreased Interest 0 0 0  Down, Depressed, Hopeless 0 0 0  PHQ - 2 Score 0 0 0  Altered sleeping - - -  Tired, decreased energy - - -  Change in appetite - - -  Feeling bad or failure about yourself  - - -  Trouble concentrating - - -  Moving slowly or fidgety/restless - - -  Suicidal thoughts - - -  PHQ-9 Score - - -  Difficult doing work/chores - - -    No flowsheet data found.  -------------------------------------------------------------------------- O: No physical exam performed due to remote telephone encounter.   No results found for this or any previous visit (from the past 2160 hour(s)).  -------------------------------------------------------------------------- A&P:  Problem List Items Addressed This Visit    Benign prostatic hyperplasia with lower urinary tract symptoms    Stable to improved chronic BPH with lower urinary tract symptoms (LUTS) without any evidence of obstruction. - AUA BPH score 8 - On Tamsulosin 0.4mg  daily - Last PSA 0.4 (06/2018) - Last DRE mild enlarged prostate, unremarkable (07/10/18)  Plan: 1. Continue Tamsulosin 0.4mg  daily for BPH LUTS - caution sudden position change dizziness side effect - OFFERED he could consider x 2 pills =0.8mg  dose but it would likely cause side effect postural  dizziness - he was aware and likely will not change  Check PSA in 6 months  May return to Urology in future if difficulty voiding or worse symptoms.  Also if he has UTI symptoms - can contact us for UA urine culture orders and then we can do virtual visit after result      Relevant Medications   tamsulosin (FLOMAX) 0.4 MG CAPS capsule   Essential (primary) hypertension    Controlled HTN No known complications  Plan: 1. Continue Carvedilol higher dose 6.25mg  BID -  indication with s/p MI - Continue on Losartan 25mg  daily - Encouraged low sodium diet, regular exercise Monitor BP at home      Paroxysmal atrial fibrillation (HCC)    Stable Followed by Cardiology On anti-platelet brilinta and aspirin - advised him that he may still have epistaxis on this regimen, limited options, in future he can discuss with cardiology on dose adjust if needed On rate control      Screening for prostate cancer   Type 2 diabetes mellitus with other specified complication ([place]) - Primary    Previously Well-controlled DM with A1c 6.5 at goal No known complications or hypoglycemia. Complications - other including hyperlipidemia  and vascular disease CAD, GERD - increases risk of future cardiovascular complications   Plan Defer A1c today since virtual visit and well controlled 1. No DM medication required. Diet controlled 2. Encourage improved lifestyle - low carb, low sugar diet, reduce portion size, continue improving regular exercise 3. Check CBG , bring log to next visit for review 4. Continue ASA, ARB, Statin 5. Request DM Eye from [facility] from 11/19/18 6. Follow-up 6 months yearly lab A1c       Other Visit Diagnoses    Chronic pain of right knee        Currently stable, chronic R knee pain, episodic, has history of meniscus injury MRI 2013 Advised when ready let us know we can refer him back to [facility]  Follow-up: - Return in 6 months for annual physical - Future labs ordered  for 06/2019  Patient verbalizes understanding with the above medical recommendations including the limitation of remote medical advice.  Specific follow-up and call-back criteria were given for patient to follow-up or seek medical care more urgently if needed.   - Time spent in direct consultation with patient on phone: 24 minutes  Nobie Putnam, [facility] 01/09/2019, 9:23 AM

## 2019-01-09 NOTE — Assessment & Plan Note (Signed)
Controlled HTN No known complications  Plan: 1. Continue Carvedilol higher dose 6.25mg  BID -  indication with s/p MI - Continue on Losartan 25mg  daily - Encouraged low sodium diet, regular exercise Monitor BP at home

## 2019-04-09 DIAGNOSIS — M1711 Unilateral primary osteoarthritis, right knee: Secondary | ICD-10-CM | POA: Diagnosis not present

## 2019-04-10 ENCOUNTER — Telehealth: Payer: Self-pay | Admitting: Cardiovascular Disease

## 2019-04-10 NOTE — Telephone Encounter (Signed)
I have spoken with the patient over the phone, he denies any recent exertional chest pain or shortness of breath. His knee issue is limiting his functional ability, however despite so, he continue to go to local supermarket without any issue. Since he is >1 year out from his PCI, will forward to Dr. Rockey Situ to see if he would be ok to let the patient hold Brilinta for 5 days prior to his total knee surgery. Ideally, given his prior cardiac history, it would be recommended that he continues aspirin through the surgery.   Dr. Rockey Situ or Dr. Saunders Revel (who is covering for Dr. Simon Rhein), please send your reply to: p cv div preop

## 2019-04-10 NOTE — Telephone Encounter (Signed)
   [facility] Pre-operative Risk Assessment    Request for surgical clearance:  1. What type of surgery is being performed? Right TKA traditional knee  2. When is this surgery scheduled? 05/01/2019  3. What type of clearance is required (medical clearance vs. Pharmacy clearance to hold med vs. Both)? Not listed  4. Are there any medications that need to be held prior to surgery and how long? Not listed 5.  6. Practice name and name of physician performing surgery? [facility]/Dr. Harlow Mares  7. What is your office phone number [phone]   7.   What is your office fax number [phone]  8.   Anesthesia type (None, local, MAC, general) ? regular   Lucienne Minks 04/10/2019, 8:05 AM  _________________________________________________________________   (provider comments below)

## 2019-04-10 NOTE — Telephone Encounter (Signed)
I think it is reasonable to hold ticagrelor for 5 days prior to knee surgery.  It should be restarted as soon as it is safe to do so from a perioperative standpoint.  ASA 81 mg daily should be continued in the perioperative period.  Nelva Bush, MD [facility] Pager: [phone]

## 2019-04-11 NOTE — Telephone Encounter (Signed)
Called patient to go over the medication recommendations. Patient verbalized an understanding and al (if any) questions were answered.

## 2019-04-11 NOTE — Telephone Encounter (Signed)
Faxed surgical clearance to requesting office via [facility] fax

## 2019-04-11 NOTE — Telephone Encounter (Signed)
   Primary Cardiologist: No primary care provider on file.  Chart reviewed as part of pre-operative protocol coverage. Patient was contacted 04/11/2019 in reference to pre-operative risk assessment for pending surgery as outlined below.  Barry [name] was last seen on 10/29/2018 by Dr. Rockey [name].  Since that day, Barry [name] has done well without chest pain or shortness of breath.  Therefore, based on ACC/AHA guidelines, the patient would be at acceptable risk for the planned procedure without further cardiovascular testing.   I will route this recommendation to the requesting party via Epic fax function and remove from pre-op pool.  Please call with questions. He will need to hold Brilinta for 5 days prior to the surgery and restart as soon as possible after the surgery. Aspirin should be continued in the perioperative period.   [place], [place] 04/11/2019, 9:29 AM

## 2019-04-12 ENCOUNTER — Telehealth: Payer: Self-pay

## 2019-04-12 NOTE — Telephone Encounter (Signed)
The pt was called and scheduled for a surgical clearance on Tuesday, June 30th.

## 2019-04-15 ENCOUNTER — Ambulatory Visit: Payer: Self-pay | Admitting: Orthopedic Surgery

## 2019-04-16 ENCOUNTER — Ambulatory Visit (INDEPENDENT_AMBULATORY_CARE_PROVIDER_SITE_OTHER): Payer: PPO | Admitting: Family Medicine

## 2019-04-16 ENCOUNTER — Encounter: Payer: Self-pay | Admitting: Family Medicine

## 2019-04-16 ENCOUNTER — Other Ambulatory Visit: Payer: Self-pay

## 2019-04-16 VITALS — BP 134/69 | HR 72 | Temp 98.4°F | Resp 16 | Ht 69.0 in | Wt 179.8 lb

## 2019-04-16 DIAGNOSIS — M25561 Pain in right knee: Secondary | ICD-10-CM

## 2019-04-16 DIAGNOSIS — M1711 Unilateral primary osteoarthritis, right knee: Secondary | ICD-10-CM

## 2019-04-16 DIAGNOSIS — G8929 Other chronic pain: Secondary | ICD-10-CM | POA: Diagnosis not present

## 2019-04-16 NOTE — Progress Notes (Signed)
Subjective:    Patient ID: Barry [name], male    DOB: 1946/04/12, 73 y.o.   MRN: [id number]  Barry [name] is a 73 y.o. male presenting on 04/16/2019 for Medical Clearance (right knee surgery replacement), Knee Pain, and Osteoarthritis   HPI  PRE-OPERATIVE MEDICAL CLEARANCE / FOLLOW-UP Right Knee Arthritis Pain Anticipated upcoming surgery - RIGHT Knee Total Arthroplasty TKA, by Dr Kurtis Bushman (at [facility]) in approximately 1 month, approx 05/15/19  Patient is followed by Cardiology (Dr Rockey Situ, at [place]), and [place] Clearance on 04/10/19 and received CLEARANCE. - GIven instructions on Brillinta and Aspirin  They are here for MEDICAL clearance today.  Last visit with me in 2019, for same problem, see prior notes for background information.  - Today patient reports persistent Right knee pain, stiffness, swelling, ready for surgery and joint replacement.  Regarding surgical and anesthesia history: - Known history of several major and some minor surgeries in past, and has tolerated general anesthesia well without problem, complication or allergy. No family history of problem with anesthesia - Able to tolerate regular exercise up to >4 METs, walking up flight of stairs - Known CAD, PAF, see Cardiology clearance note - No known history of pulmonary disease. Quit smoking 1995, never diagnosed with COPD, Asthma, OSA - Denies exertional symptoms of chest pain or tightness, dyspnea, coughing, apnea, syncopal episodes, palpitations   Past Surgical History:  Procedure Laterality Date  . CATARACT EXTRACTION W/PHACO Right 04/12/2016   Procedure: CATARACT EXTRACTION PHACO AND INTRAOCULAR LENS PLACEMENT (IOC);  Surgeon: Birder Robson, MD;  Location: [facility] ORS;  Service: Ophthalmology;  Laterality: Right;  [place]' 01:05 AP% 26.6 CDE 17.48 fluid pack lot # [id number]  . CORONARY/GRAFT ACUTE MI REVASCULARIZATION N/A 01/16/2018   Procedure:  Coronary/Graft Acute MI Revascularization;  Surgeon: Nelva Bush, MD;  Location: [place] [facility];  Service: Cardiovascular;  Laterality: N/A;  . KNEE SURGERY Right 09/21/2012  . LEFT HEART CATH AND CORONARY ANGIOGRAPHY N/A 01/16/2018   Procedure: LEFT HEART CATH AND CORONARY ANGIOGRAPHY;  Surgeon: Nelva Bush, MD;  Location: [place] [facility];  Service: Cardiovascular;  Laterality: N/A;    Depression screen [facility] 2/9 04/16/2019 01/09/2019 08/21/2018  Decreased Interest 0 0 0  Down, Depressed, Hopeless 0 0 0  PHQ - 2 Score 0 0 0  Altered sleeping - - -  Tired, decreased energy - - -  Change in appetite - - -  Feeling bad or failure about yourself  - - -  Trouble concentrating - - -  Moving slowly or fidgety/restless - - -  Suicidal thoughts - - -  PHQ-9 Score - - -  Difficult doing work/chores - - -    Social History   Tobacco Use  . Smoking status: Former Research scientist (life sciences)  . Smokeless tobacco: Former Systems developer    Types: Chew    Quit date: 10/17/1993  Substance Use Topics  . Alcohol use: No  . Drug use: No    Review of Systems Per HPI unless specifically indicated above     Objective:    BP 134/69   Pulse 72   Temp 98.4 F (36.9 C) (Oral)   Resp 16   Ht 5\' 9"  (1.753 m)   Wt 179 lb 12.8 oz (81.6 kg)   BMI 26.55 kg/m   Wt Readings from Last 3 Encounters:  04/16/19 179 lb 12.8 oz (81.6 kg)  10/29/18 179 lb (81.2 kg)  08/21/18 179 lb (81.2 kg)  Physical Exam Vitals signs and nursing note reviewed.  Constitutional:      General: He is not in acute distress.    Appearance: He is well-developed. He is not diaphoretic.     Comments: Well-appearing, comfortable, cooperative  HENT:     Head: Normocephalic and atraumatic.  Eyes:     General:        Right eye: No discharge.        Left eye: No discharge.     Conjunctiva/sclera: Conjunctivae normal.  Neck:     Musculoskeletal: Normal range of motion and neck supple.     Thyroid: No thyromegaly.  Cardiovascular:      Rate and Rhythm: Normal rate and regular rhythm.     Heart sounds: Normal heart sounds. No murmur.  Pulmonary:     Effort: Pulmonary effort is normal. No respiratory distress.     Breath sounds: Normal breath sounds. No wheezing or rales.  Musculoskeletal: Normal range of motion.  Lymphadenopathy:     Cervical: No cervical adenopathy.  Skin:    General: Skin is warm and dry.     Findings: No erythema or rash.  Neurological:     Mental Status: He is alert and oriented to person, place, and time.  Psychiatric:        Behavior: Behavior normal.     Comments: Well groomed, good eye contact, normal speech and thoughts      Results for orders placed or performed in visit on 07/03/18  PSA, Total with Reflex to PSA, Free  Result Value Ref Range   PSA, Total 0.4 < OR = 4.0 ng/mL  Hemoglobin A1c  Result Value Ref Range   Hgb A1c MFr Bld 6.5 (H) <5.7 % of total Hgb   Mean Plasma Glucose 140 (calc)   eAG (mmol/L) 7.7 (calc)  CBC with Differential/Platelet  Result Value Ref Range   WBC 6.6 3.8 - 10.8 Thousand/uL   RBC 4.40 4.20 - 5.80 Million/uL   Hemoglobin 13.6 13.2 - 17.1 g/dL   HCT 39.4 38.5 - 50.0 %   MCV 89.5 80.0 - 100.0 fL   MCH 30.9 27.0 - 33.0 pg   MCHC 34.5 32.0 - 36.0 g/dL   RDW 12.4 11.0 - 15.0 %   Platelets 203 140 - 400 Thousand/uL   MPV 9.5 7.5 - 12.5 fL   Neutro Abs 4,231 1,500 - 7,800 cells/uL   Lymphs Abs 1,584 850 - 3,900 cells/uL   WBC mixed population 568 200 - 950 cells/uL   Eosinophils Absolute 178 15 - 500 cells/uL   Basophils Absolute 40 0 - 200 cells/uL   Neutrophils Relative % 64.1 %   Total Lymphocyte 24.0 %   Monocytes Relative 8.6 %   Eosinophils Relative 2.7 %   Basophils Relative 0.6 %  COMPLETE METABOLIC PANEL WITH GFR  Result Value Ref Range   Glucose, Bld 118 (H) 65 - 99 mg/dL   BUN 14 7 - 25 mg/dL   Creat 0.88 0.70 - 1.18 mg/dL   GFR, Est Non African American 86 > OR = 60 mL/min/1.72m2   GFR, Est African American 99 > OR = 60  mL/min/1.32m2   BUN/Creatinine Ratio NOT APPLICABLE 6 - 22 (calc)   Sodium 140 135 - 146 mmol/L   Potassium 4.0 3.5 - 5.3 mmol/L   Chloride 106 98 - 110 mmol/L   CO2 27 20 - 32 mmol/L   Calcium 9.0 8.6 - 10.3 mg/dL   Total Protein 6.3 6.1 - 8.1  g/dL   Albumin 4.5 3.6 - 5.1 g/dL   Globulin 1.8 (L) 1.9 - 3.7 g/dL (calc)   AG Ratio 2.5 1.0 - 2.5 (calc)   Total Bilirubin 0.7 0.2 - 1.2 mg/dL   Alkaline phosphatase (APISO) 82 40 - 115 U/L   AST 18 10 - 35 U/L   ALT 18 9 - 46 U/L  Lipid panel  Result Value Ref Range   Cholesterol 122 <200 mg/dL   HDL 40 (L) >40 mg/dL   Triglycerides 88 <150 mg/dL   LDL Cholesterol (Calc) 65 mg/dL (calc)   Total CHOL/HDL Ratio 3.1 <5.0 (calc)   Non-HDL Cholesterol (Calc) 82 <130 mg/dL (calc)      Assessment & Plan:   Problem List Items Addressed This Visit    Osteoarthritis of knee - Primary   Right knee pain     Pre-op clearance for non-cardiac surgery today, Right Knee Replacement TKR (intermediate risk), general anesthesia. - Previously tolerated prior surgeries with general anesthesia - Known cardiac history / prior CAD, MI, PAF. Appropriate functional status >4 METs - Former smoker, quit >25 years ago  Plan: Given significant cardiovascular disease patient has already been cleared by Cardiology [facility] Dr Rockey Situ on 04/10/19  1. MEDICALLY CLEARED now for elective surgery. Completed provided pre-op paperwork per [facility] office 2. Reviewed last labs 06/2018 unremarkable, normal Creatinine 3. No repeat EKG today. Last done 10/2018 per Cardiology and he has been cleared. 4. Normal BP today 5. Med instructions pre-op - He will need to hold Brilinta for 5 days prior to the surgery and restart as soon as possible after the surgery. Aspirin should be continued in the perioperative period.  We did not repeat any blood work today or urinalysis as requested on form, as it appears patient is scheduled for a Pre-Operative lab evaluation at hospital on  05/01/19 with blood, urine, anticoag testing, and COVID19 testing per Dr Harlow Mares already, will stay tuned if patient needs any of these tests sooner we can order as well at our office, patient will contact surgery to ask about if he needs new orders from [place].  Does not need chest x-ray at this time. Not indicated based on his medical history. No acute concerns pulmonary at this time.  ACC/AHA (reported risk of cardiac death or nonfatal MI generally 1 to 5%). Intermediate Risk (carotid endarterectomy, Head/Neck, Intraperitonteal / Intrathoracic, Orthopedic, Prostate)  Revised Cardiac Risk Index (RCRI) Six independent predictors of major cardiac complications[1] 1. High-risk type of surgery 2. H/o ischemic heart disease (h/o MI, positive exercise test, active CP myocardial ischemia, use of nitrates for CP, EKG w/ pathological Q waves) 3. H/o Heart Failure 4. H/o CVA 5. Diabetes mellitus requiring treatment with insulin 6. Preoperative serum creatinine >2.0 mg/dL (177 micromol/L)  Rate of cardiac death, nonfatal MI, and nonfatal cardiac arrest =   One risk factor - 1.0 percent (95% CI: 0.5-1.4)   Rate of MI, pulmonary edema, v-fib, primary cardiac arrest, and complete heart block = One risk factor - 1.3 percent (95% CI: 0.7-2.1)   No orders of the defined types were placed in this encounter.    Follow up plan: Return if symptoms worsen or fail to improve, for pre op knee surgery.   Nobie Putnam, [place] [facility] 04/16/2019, 10:04 AM

## 2019-04-16 NOTE — Patient Instructions (Addendum)
Thank you for coming to the office today.  You are CLEARED from MEDICAL stand point to proceed with knee replacement surgery with Dr Harlow Mares as scheduled.  Your last EKG was 10/2018 we will send them a copy.  Your last blood work was 06/2018.  It looks like Dr Harlow Mares has ordered some pre-operative labs already at hospital including a urine test and COVID19 test.  Please call his office to check when they are doing those tests, and ask if you still need your primary doctor to order any blood or urine tests in addition. Otherwise, you can tell them that both your CARDIOLOGIST and PRIMARY Doctors have CLEARED you at this point.  Please schedule a Follow-up Appointment to: Return if symptoms worsen or fail to improve, for pre op knee surgery.  If you have any other questions or concerns, please feel free to call the office or send a message through [place]. You may also schedule an earlier appointment if necessary.  Additionally, you may be receiving a survey about your experience at our office within a few days to 1 week by e-mail or mail. We value your feedback.  Nobie Putnam, DO [place]

## 2019-05-01 DIAGNOSIS — M1711 Unilateral primary osteoarthritis, right knee: Secondary | ICD-10-CM | POA: Diagnosis not present

## 2019-05-01 DIAGNOSIS — Z01818 Encounter for other preprocedural examination: Secondary | ICD-10-CM | POA: Diagnosis not present

## 2019-05-01 DIAGNOSIS — M25561 Pain in right knee: Secondary | ICD-10-CM | POA: Diagnosis not present

## 2019-05-01 DIAGNOSIS — M25661 Stiffness of right knee, not elsewhere classified: Secondary | ICD-10-CM | POA: Diagnosis not present

## 2019-05-06 ENCOUNTER — Ambulatory Visit: Payer: Self-pay | Admitting: Orthopedic Surgery

## 2019-05-08 ENCOUNTER — Encounter
Admission: RE | Admit: 2019-05-08 | Discharge: 2019-05-08 | Disposition: A | Discharge status: Home / Self Care | Payer: PPO | Source: Ambulatory Visit | Attending: Orthopedic Surgery | Admitting: Orthopedic Surgery

## 2019-05-08 ENCOUNTER — Other Ambulatory Visit: Payer: Self-pay

## 2019-05-08 DIAGNOSIS — I252 Old myocardial infarction: Secondary | ICD-10-CM | POA: Diagnosis not present

## 2019-05-08 DIAGNOSIS — Z79899 Other long term (current) drug therapy: Secondary | ICD-10-CM | POA: Diagnosis not present

## 2019-05-08 DIAGNOSIS — Z01812 Encounter for preprocedural laboratory examination: Secondary | ICD-10-CM | POA: Insufficient documentation

## 2019-05-08 DIAGNOSIS — I10 Essential (primary) hypertension: Secondary | ICD-10-CM | POA: Diagnosis not present

## 2019-05-08 DIAGNOSIS — Z87891 Personal history of nicotine dependence: Secondary | ICD-10-CM | POA: Insufficient documentation

## 2019-05-08 DIAGNOSIS — I2510 Atherosclerotic heart disease of native coronary artery without angina pectoris: Secondary | ICD-10-CM | POA: Diagnosis not present

## 2019-05-08 DIAGNOSIS — M1711 Unilateral primary osteoarthritis, right knee: Secondary | ICD-10-CM | POA: Insufficient documentation

## 2019-05-08 HISTORY — DX: Atherosclerotic heart disease of native coronary artery without angina pectoris: I25.10

## 2019-05-08 HISTORY — DX: Acute myocardial infarction, unspecified: I21.9

## 2019-05-08 HISTORY — DX: Dyspnea, unspecified: R06.00

## 2019-05-08 LAB — CBC
HCT: 40.2 % (ref 39.0–52.0)
Hemoglobin: 13.8 g/dL (ref 13.0–17.0)
MCH: 31.4 pg (ref 26.0–34.0)
MCHC: 34.3 g/dL (ref 30.0–36.0)
MCV: 91.4 fL (ref 80.0–100.0)
Platelets: 221 10*3/uL (ref 150–400)
RBC: 4.4 MIL/uL (ref 4.22–5.81)
RDW: 12.1 % (ref 11.5–15.5)
WBC: 7.3 10*3/uL (ref 4.0–10.5)
nRBC: 0 % (ref 0.0–0.2)

## 2019-05-08 LAB — BASIC METABOLIC PANEL
Anion gap: 8 (ref 5–15)
BUN: 14 mg/dL (ref 8–23)
CO2: 25 mmol/L (ref 22–32)
Calcium: 9.1 mg/dL (ref 8.9–10.3)
Chloride: 109 mmol/L (ref 98–111)
Creatinine, Ser: 0.78 mg/dL (ref 0.61–1.24)
GFR calc Af Amer: >60 mL/min (ref >60)
GFR calc non Af Amer: >60 mL/min (ref >60)
Glucose, Bld: 138 mg/dL — ABNORMAL HIGH (ref 70–99)
Potassium: 3.9 mmol/L (ref 3.5–5.1)
Sodium: 142 mmol/L (ref 135–145)

## 2019-05-08 LAB — URINALYSIS, ROUTINE W REFLEX MICROSCOPIC
Bilirubin Urine: NEGATIVE
Glucose, UA: NEGATIVE mg/dL
Hgb urine dipstick: NEGATIVE
Ketones, ur: NEGATIVE mg/dL
Leukocytes,Ua: NEGATIVE
Nitrite: NEGATIVE
Protein, ur: NEGATIVE mg/dL
Specific Gravity, Urine: 1.011 (ref 1.005–1.030)
pH: 7 (ref 5.0–8.0)

## 2019-05-08 LAB — TYPE AND SCREEN
ABO/RH(D): A POS
Antibody Screen: NEGATIVE

## 2019-05-08 LAB — SURGICAL PCR SCREEN
MRSA, PCR: NEGATIVE
Staphylococcus aureus: NEGATIVE

## 2019-05-08 LAB — PROTIME-INR
INR: 1 (ref 0.8–1.2)
Prothrombin Time: 13.2 seconds (ref 11.4–15.2)

## 2019-05-08 LAB — APTT: aPTT: 27 seconds (ref 24–36)

## 2019-05-08 NOTE — Patient Instructions (Addendum)
Your procedure is scheduled on: 05/15/2019 Wed Report to Same Day Surgery 2nd floor medical mall [facility] Entrance-take elevator on left to 2nd floor.  Check in with surgery information desk.) To find out your arrival time please call [phone] between 1PM - 3PM on 05/14/2019 Tues  Remember: Instructions that are not followed completely may result in serious medical risk, up to and including death, or upon the discretion of your surgeon and anesthesiologist your surgery may need to be rescheduled.    _x___ 1. Do not eat food after midnight the night before your procedure. You may drink clear liquids up to 2 hours before you are scheduled to arrive at the hospital for your procedure.  Do not drink clear liquids within 2 hours of your scheduled arrival to the hospital.  Clear liquids include  --Water or Apple juice without pulp  --Clear carbohydrate beverage such as ClearFast or Gatorade  --Black Coffee or Clear Tea (No milk, no creamers, do not add anything to                  the coffee or Tea Type 1 and type 2 diabetics should only drink water.   ____Ensure clear carbohydrate drink on the way to the hospital for bariatric patients  _x___Ensure clear carbohydrate drink 3 hours before surgery.   No gum chewing or hard candies.     __x__ 2. No Alcohol for 24 hours before or after surgery.   __x__3. No Smoking or e-cigarettes for 24 prior to surgery.  Do not use any chewable tobacco products for at least 6 hour prior to surgery   ____  4. Bring all medications with you on the day of surgery if instructed.    __x__ 5. Notify your doctor if there is any change in your medical condition     (cold, fever, infections).    x___6. On the morning of surgery brush your teeth with toothpaste and water.  You may rinse your mouth with mouth wash if you wish.  Do not swallow any toothpaste or mouthwash.   Do not wear jewelry, make-up, hairpins, clips or nail polish.  Do not wear lotions,  powders, or perfumes. You may wear deodorant.  Do not shave 48 hours prior to surgery. Men may shave face and neck.  Do not bring valuables to the hospital.    [facility] is not responsible for any belongings or valuables.               Contacts, dentures or bridgework may not be worn into surgery.  Leave your suitcase in the car. After surgery it may be brought to your room.  For patients admitted to the hospital, discharge time is determined by your                       treatment team.  _  Patients discharged the day of surgery will not be allowed to drive home.  You will need someone to drive you home and stay with you the night of your procedure.    Please read over the following fact sheets that you were given:   [facility] Preparing for Surgery and or MRSA Information   _x___ Take anti-hypertensive listed below, cardiac, seizure, asthma,     anti-reflux and psychiatric medicines. These include:  1. carvedilol (COREG) 6.25 MG tablet  2.fluticasone (FLONASE) 50 MCG/ACT nasal spray if needed  3.tamsulosin (FLOMAX) 0.4 MG CAPS capsule  4.  5.  6.  ____Fleets enema or Magnesium Citrate as directed.   _x___ Use CHG Soap or sage wipes as directed on instruction sheet   ____ Use inhalers on the day of surgery and bring to hospital day of surgery  ____ Stop Metformin and Janumet 2 days prior to surgery.    ____ Take 1/2 of usual insulin dose the night before surgery and none on the morning     surgery.   _x___ Follow recommendations from Cardiologist, Pulmonologist or PCP regarding          stopping Aspirin, Coumadin, Plavix ,Eliquis, Effient, or Pradaxa, and Pletal. Stop Brilinta 5 days before surgery per physician.  X____Stop Anti-inflammatories such as Advil, Aleve, Ibuprofen, Motrin, Naproxen, Naprosyn, Goodies powders or aspirin products. OK to take Tylenol and   Celebrex.   _x___ Stop supplements until after surgery.  But may continue Vitamin D, Vitamin B,       and  multivitamin.   ____ Bring C-Pap to the hospital.

## 2019-05-10 ENCOUNTER — Other Ambulatory Visit: Admission: RE | Admit: 2019-05-10 | Payer: PPO | Source: Ambulatory Visit

## 2019-05-13 ENCOUNTER — Other Ambulatory Visit: Payer: Self-pay

## 2019-05-13 ENCOUNTER — Other Ambulatory Visit
Admission: RE | Admit: 2019-05-13 | Discharge: 2019-05-13 | Disposition: A | Discharge status: Home / Self Care | Payer: PPO | Source: Ambulatory Visit | Attending: Orthopedic Surgery | Admitting: Orthopedic Surgery

## 2019-05-13 ENCOUNTER — Other Ambulatory Visit: Payer: PPO

## 2019-05-13 DIAGNOSIS — Z20828 Contact with and (suspected) exposure to other viral communicable diseases: Secondary | ICD-10-CM | POA: Diagnosis not present

## 2019-05-14 LAB — SARS CORONAVIRUS 2 (TAT 6-24 HRS): SARS Coronavirus 2: NEGATIVE

## 2019-05-14 MED ORDER — CEFAZOLIN SODIUM-DEXTROSE 2-4 GM/100ML-% IV SOLN
2.0000 g | INTRAVENOUS | Status: AC
  Administered 2019-05-15: 11:00:00 2 g via INTRAVENOUS

## 2019-05-14 MED ORDER — TRANEXAMIC ACID-NACL 1000-0.7 MG/100ML-% IV SOLN
1000.0000 mg | INTRAVENOUS | Status: AC
  Administered 2019-05-15: 11:00:00 1000 mg via INTRAVENOUS
  Filled 2019-05-14: qty 100

## 2019-05-15 ENCOUNTER — Inpatient Hospital Stay
Admission: RE | Admit: 2019-05-15 | Discharge: 2019-05-17 | DRG: 470 | Disposition: A | Discharge status: Home / Self Care | Payer: PPO | Attending: Orthopedic Surgery | Admitting: Orthopedic Surgery

## 2019-05-15 ENCOUNTER — Inpatient Hospital Stay: Payer: PPO | Admitting: Anesthesiology

## 2019-05-15 ENCOUNTER — Other Ambulatory Visit: Payer: Self-pay

## 2019-05-15 ENCOUNTER — Inpatient Hospital Stay: Payer: PPO

## 2019-05-15 ENCOUNTER — Encounter
Admission: RE | Disposition: A | Discharge status: Home / Self Care | Payer: Self-pay | Source: Home / Self Care | Attending: Orthopedic Surgery

## 2019-05-15 ENCOUNTER — Encounter: Payer: Self-pay | Admitting: *Deleted

## 2019-05-15 DIAGNOSIS — I10 Essential (primary) hypertension: Secondary | ICD-10-CM | POA: Diagnosis present

## 2019-05-15 DIAGNOSIS — Z955 Presence of coronary angioplasty implant and graft: Secondary | ICD-10-CM | POA: Diagnosis not present

## 2019-05-15 DIAGNOSIS — N400 Enlarged prostate without lower urinary tract symptoms: Secondary | ICD-10-CM | POA: Diagnosis present

## 2019-05-15 DIAGNOSIS — M25561 Pain in right knee: Secondary | ICD-10-CM | POA: Diagnosis not present

## 2019-05-15 DIAGNOSIS — Z471 Aftercare following joint replacement surgery: Secondary | ICD-10-CM | POA: Diagnosis not present

## 2019-05-15 DIAGNOSIS — G8918 Other acute postprocedural pain: Secondary | ICD-10-CM | POA: Diagnosis not present

## 2019-05-15 DIAGNOSIS — Z87891 Personal history of nicotine dependence: Secondary | ICD-10-CM | POA: Diagnosis not present

## 2019-05-15 DIAGNOSIS — I2510 Atherosclerotic heart disease of native coronary artery without angina pectoris: Secondary | ICD-10-CM | POA: Diagnosis not present

## 2019-05-15 DIAGNOSIS — M1711 Unilateral primary osteoarthritis, right knee: Secondary | ICD-10-CM | POA: Diagnosis not present

## 2019-05-15 DIAGNOSIS — K219 Gastro-esophageal reflux disease without esophagitis: Secondary | ICD-10-CM | POA: Diagnosis present

## 2019-05-15 DIAGNOSIS — Z20828 Contact with and (suspected) exposure to other viral communicable diseases: Secondary | ICD-10-CM | POA: Diagnosis not present

## 2019-05-15 DIAGNOSIS — I252 Old myocardial infarction: Secondary | ICD-10-CM | POA: Diagnosis not present

## 2019-05-15 DIAGNOSIS — Z96651 Presence of right artificial knee joint: Secondary | ICD-10-CM | POA: Diagnosis not present

## 2019-05-15 HISTORY — PX: TOTAL KNEE ARTHROPLASTY: SHX125

## 2019-05-15 SURGERY — ARTHROPLASTY, KNEE, TOTAL
Anesthesia: Spinal | Site: Knee | Laterality: Right

## 2019-05-15 MED ORDER — PHENOL 1.4 % MT LIQD
1.0000 | OROMUCOSAL | Status: DC | PRN
  Filled 2019-05-15: qty 177

## 2019-05-15 MED ORDER — SODIUM CHLORIDE 0.9 % IV SOLN
INTRAVENOUS | Status: DC | PRN
  Administered 2019-05-15: 12:00:00 25 ug/min via INTRAVENOUS

## 2019-05-15 MED ORDER — CHLORHEXIDINE GLUCONATE 4 % EX LIQD: 60.0000 mL | Freq: Once | CUTANEOUS | Status: DC

## 2019-05-15 MED ORDER — MORPHINE SULFATE (PF) 2 MG/ML IV SOLN: 0.5000 mg | INTRAVENOUS | Status: DC | PRN

## 2019-05-15 MED ORDER — ONDANSETRON HCL 4 MG/2ML IJ SOLN
4.0000 mg | Freq: Four times a day (QID) | INTRAMUSCULAR | Status: DC | PRN
  Administered 2019-05-15: 4 mg via INTRAVENOUS
  Filled 2019-05-15: qty 2

## 2019-05-15 MED ORDER — MAGNESIUM HYDROXIDE 400 MG/5ML PO SUSP: 30.0000 mL | Freq: Every day | ORAL | Status: DC | PRN

## 2019-05-15 MED ORDER — DOCUSATE SODIUM 100 MG PO CAPS
100.0000 mg | ORAL_CAPSULE | Freq: Two times a day (BID) | ORAL | Status: DC
  Administered 2019-05-15 – 2019-05-17 (×4): 100 mg via ORAL
  Filled 2019-05-15 (×4): qty 1

## 2019-05-15 MED ORDER — SODIUM CHLORIDE 0.9 % IV SOLN
INTRAVENOUS | Status: DC | PRN
  Administered 2019-05-15: 12:00:00 60 mL

## 2019-05-15 MED ORDER — MIDAZOLAM HCL 5 MG/5ML IJ SOLN
INTRAMUSCULAR | Status: DC | PRN
  Administered 2019-05-15 (×2): 1 mg via INTRAVENOUS

## 2019-05-15 MED ORDER — ASPIRIN EC 81 MG PO TBEC
81.0000 mg | DELAYED_RELEASE_TABLET | Freq: Every day | ORAL | Status: DC
  Administered 2019-05-16 – 2019-05-17 (×2): 81 mg via ORAL
  Filled 2019-05-15 (×2): qty 1

## 2019-05-15 MED ORDER — FENTANYL CITRATE (PF) 100 MCG/2ML IJ SOLN
INTRAMUSCULAR | Status: DC | PRN
  Administered 2019-05-15 (×2): 50 ug via INTRAVENOUS

## 2019-05-15 MED ORDER — MIDAZOLAM HCL 2 MG/2ML IJ SOLN
INTRAMUSCULAR | Status: AC
  Filled 2019-05-15: qty 2

## 2019-05-15 MED ORDER — CEFAZOLIN SODIUM-DEXTROSE 2-4 GM/100ML-% IV SOLN
INTRAVENOUS | Status: AC
  Filled 2019-05-15: qty 100

## 2019-05-15 MED ORDER — BUPIVACAINE HCL (PF) 0.5 % IJ SOLN
INTRAMUSCULAR | Status: DC | PRN
  Administered 2019-05-15: 2.8 mL

## 2019-05-15 MED ORDER — FENTANYL CITRATE (PF) 100 MCG/2ML IJ SOLN: 25.0000 ug | INTRAMUSCULAR | Status: DC | PRN

## 2019-05-15 MED ORDER — FENTANYL CITRATE (PF) 100 MCG/2ML IJ SOLN
INTRAMUSCULAR | Status: AC
  Filled 2019-05-15: qty 2

## 2019-05-15 MED ORDER — SODIUM CHLORIDE FLUSH 0.9 % IV SOLN
INTRAVENOUS | Status: AC
  Filled 2019-05-15: qty 60

## 2019-05-15 MED ORDER — FENTANYL CITRATE (PF) 100 MCG/2ML IJ SOLN
25.0000 ug | Freq: Once | INTRAMUSCULAR | Status: AC
  Administered 2019-05-15: 10:00:00 25 ug via INTRAVENOUS

## 2019-05-15 MED ORDER — HYDROCODONE-ACETAMINOPHEN 7.5-325 MG PO TABS: 1.0000 | ORAL_TABLET | ORAL | Status: DC | PRN

## 2019-05-15 MED ORDER — BUPIVACAINE LIPOSOME 1.3 % IJ SUSP
INTRAMUSCULAR | Status: AC
  Filled 2019-05-15: qty 20

## 2019-05-15 MED ORDER — BUPIVACAINE-EPINEPHRINE (PF) 0.5% -1:200000 IJ SOLN
INTRAMUSCULAR | Status: DC | PRN
  Administered 2019-05-15: 30 mL via PERINEURAL

## 2019-05-15 MED ORDER — METOCLOPRAMIDE HCL 5 MG/ML IJ SOLN
5.0000 mg | Freq: Three times a day (TID) | INTRAMUSCULAR | Status: DC | PRN
  Administered 2019-05-15: 10 mg via INTRAVENOUS
  Filled 2019-05-15: qty 2

## 2019-05-15 MED ORDER — ACETAMINOPHEN 500 MG PO TABS
1000.0000 mg | ORAL_TABLET | Freq: Once | ORAL | Status: AC
  Administered 2019-05-15: 09:00:00 1000 mg via ORAL

## 2019-05-15 MED ORDER — PROPOFOL 500 MG/50ML IV EMUL
INTRAVENOUS | Status: DC | PRN
  Administered 2019-05-15: 25 ug/kg/min via INTRAVENOUS

## 2019-05-15 MED ORDER — FENTANYL CITRATE (PF) 100 MCG/2ML IJ SOLN
INTRAMUSCULAR | Status: AC
  Administered 2019-05-15: 25 ug via INTRAVENOUS
  Filled 2019-05-15: qty 2

## 2019-05-15 MED ORDER — MENTHOL 3 MG MT LOZG
1.0000 | LOZENGE | OROMUCOSAL | Status: DC | PRN
  Filled 2019-05-15: qty 9

## 2019-05-15 MED ORDER — SODIUM CHLORIDE 0.9 % IR SOLN
Status: DC | PRN
  Administered 2019-05-15: 1000 mL

## 2019-05-15 MED ORDER — EPHEDRINE SULFATE 50 MG/ML IJ SOLN
INTRAMUSCULAR | Status: DC | PRN
  Administered 2019-05-15 (×2): 10 mg via INTRAVENOUS

## 2019-05-15 MED ORDER — ONDANSETRON HCL 4 MG PO TABS: 4.0000 mg | ORAL_TABLET | Freq: Four times a day (QID) | ORAL | Status: DC | PRN

## 2019-05-15 MED ORDER — TICAGRELOR 90 MG PO TABS
90.0000 mg | ORAL_TABLET | Freq: Two times a day (BID) | ORAL | Status: DC
  Administered 2019-05-16 – 2019-05-17 (×3): 90 mg via ORAL
  Filled 2019-05-15 (×5): qty 1

## 2019-05-15 MED ORDER — ALUM & MAG HYDROXIDE-SIMETH 200-200-20 MG/5ML PO SUSP: 30.0000 mL | ORAL | Status: DC | PRN

## 2019-05-15 MED ORDER — GABAPENTIN 300 MG PO CAPS
ORAL_CAPSULE | ORAL | Status: AC
  Administered 2019-05-15: 300 mg via ORAL
  Filled 2019-05-15: qty 1

## 2019-05-15 MED ORDER — LACTATED RINGERS IV SOLN
INTRAVENOUS | Status: DC
  Administered 2019-05-15 (×2): via INTRAVENOUS

## 2019-05-15 MED ORDER — BUPIVACAINE LIPOSOME 1.3 % IJ SUSP: 10.0000 mL | Freq: Once | INTRAMUSCULAR | Status: DC

## 2019-05-15 MED ORDER — METOCLOPRAMIDE HCL 10 MG PO TABS: 5.0000 mg | ORAL_TABLET | Freq: Three times a day (TID) | ORAL | Status: DC | PRN

## 2019-05-15 MED ORDER — BUPIVACAINE HCL (PF) 0.5 % IJ SOLN
INTRAMUSCULAR | Status: AC
  Filled 2019-05-15: qty 10

## 2019-05-15 MED ORDER — LIDOCAINE HCL (PF) 1 % IJ SOLN
INTRAMUSCULAR | Status: AC
  Filled 2019-05-15: qty 5

## 2019-05-15 MED ORDER — LOSARTAN POTASSIUM 25 MG PO TABS
25.0000 mg | ORAL_TABLET | Freq: Every day | ORAL | Status: DC
  Administered 2019-05-16 – 2019-05-17 (×2): 25 mg via ORAL
  Filled 2019-05-15 (×2): qty 1

## 2019-05-15 MED ORDER — LACTATED RINGERS IV SOLN
INTRAVENOUS | Status: DC
  Administered 2019-05-15: 75 mL/h via INTRAVENOUS

## 2019-05-15 MED ORDER — ROPIVACAINE HCL 5 MG/ML IJ SOLN
INTRAMUSCULAR | Status: AC
  Filled 2019-05-15: qty 30

## 2019-05-15 MED ORDER — GABAPENTIN 300 MG PO CAPS
300.0000 mg | ORAL_CAPSULE | Freq: Three times a day (TID) | ORAL | Status: DC
  Administered 2019-05-15 – 2019-05-17 (×6): 300 mg via ORAL
  Filled 2019-05-15 (×6): qty 1

## 2019-05-15 MED ORDER — OXYCODONE HCL 5 MG/5ML PO SOLN: 5.0000 mg | Freq: Once | ORAL | Status: DC | PRN

## 2019-05-15 MED ORDER — MAGNESIUM CITRATE PO SOLN
1.0000 | Freq: Once | ORAL | Status: DC | PRN
  Filled 2019-05-15: qty 296

## 2019-05-15 MED ORDER — ACETAMINOPHEN 325 MG PO TABS: 325.0000 mg | ORAL_TABLET | Freq: Four times a day (QID) | ORAL | Status: DC | PRN

## 2019-05-15 MED ORDER — PROPOFOL 500 MG/50ML IV EMUL
INTRAVENOUS | Status: AC
  Filled 2019-05-15: qty 50

## 2019-05-15 MED ORDER — KETOROLAC TROMETHAMINE 15 MG/ML IJ SOLN
7.5000 mg | Freq: Four times a day (QID) | INTRAMUSCULAR | Status: AC
  Filled 2019-05-15: qty 1

## 2019-05-15 MED ORDER — TAMSULOSIN HCL 0.4 MG PO CAPS
0.4000 mg | ORAL_CAPSULE | Freq: Every day | ORAL | Status: DC
  Administered 2019-05-15 – 2019-05-16 (×2): 0.4 mg via ORAL
  Filled 2019-05-15 (×2): qty 1

## 2019-05-15 MED ORDER — ACETAMINOPHEN 500 MG PO TABS
500.0000 mg | ORAL_TABLET | Freq: Four times a day (QID) | ORAL | Status: AC
  Administered 2019-05-15 – 2019-05-16 (×3): 500 mg via ORAL
  Filled 2019-05-15 (×3): qty 1

## 2019-05-15 MED ORDER — CARVEDILOL 3.125 MG PO TABS
6.2500 mg | ORAL_TABLET | Freq: Two times a day (BID) | ORAL | Status: DC
  Administered 2019-05-15 – 2019-05-17 (×4): 6.25 mg via ORAL
  Filled 2019-05-15 (×4): qty 2

## 2019-05-15 MED ORDER — POVIDONE-IODINE 10 % EX SWAB
2.0000 "application " | Freq: Once | CUTANEOUS | Status: AC
  Administered 2019-05-15: 2 via TOPICAL

## 2019-05-15 MED ORDER — OXYCODONE HCL 5 MG PO TABS: 5.0000 mg | ORAL_TABLET | Freq: Once | ORAL | Status: DC | PRN

## 2019-05-15 MED ORDER — BISACODYL 10 MG RE SUPP: 10.0000 mg | Freq: Every day | RECTAL | Status: DC | PRN

## 2019-05-15 MED ORDER — GABAPENTIN 300 MG PO CAPS
300.0000 mg | ORAL_CAPSULE | Freq: Once | ORAL | Status: AC
  Administered 2019-05-15: 09:00:00 300 mg via ORAL

## 2019-05-15 MED ORDER — CELECOXIB 200 MG PO CAPS
400.0000 mg | ORAL_CAPSULE | Freq: Once | ORAL | Status: AC
  Administered 2019-05-15: 09:00:00 400 mg via ORAL

## 2019-05-15 MED ORDER — TRAMADOL HCL 50 MG PO TABS
50.0000 mg | ORAL_TABLET | Freq: Four times a day (QID) | ORAL | Status: DC
  Administered 2019-05-15 – 2019-05-16 (×2): 50 mg via ORAL
  Filled 2019-05-15 (×3): qty 1

## 2019-05-15 MED ORDER — PHENYLEPHRINE HCL (PRESSORS) 10 MG/ML IV SOLN
INTRAVENOUS | Status: DC | PRN
  Administered 2019-05-15 (×2): 100 ug via INTRAVENOUS

## 2019-05-15 MED ORDER — BUPIVACAINE-EPINEPHRINE (PF) 0.25% -1:200000 IJ SOLN
INTRAMUSCULAR | Status: AC
  Filled 2019-05-15: qty 30

## 2019-05-15 MED ORDER — LACTATED RINGERS IV SOLN
INTRAVENOUS | Status: DC
  Administered 2019-05-15: 22:00:00 via INTRAVENOUS

## 2019-05-15 MED ORDER — HYDROCODONE-ACETAMINOPHEN 5-325 MG PO TABS: 1.0000 | ORAL_TABLET | ORAL | Status: DC | PRN

## 2019-05-15 MED ORDER — ATORVASTATIN CALCIUM 20 MG PO TABS
80.0000 mg | ORAL_TABLET | Freq: Every day | ORAL | Status: DC
  Administered 2019-05-15 – 2019-05-16 (×2): 80 mg via ORAL
  Filled 2019-05-15 (×2): qty 4

## 2019-05-15 MED ORDER — BACITRACIN 50000 UNITS IM SOLR
INTRAMUSCULAR | Status: AC
  Filled 2019-05-15: qty 2

## 2019-05-15 MED ORDER — CELECOXIB 200 MG PO CAPS
ORAL_CAPSULE | ORAL | Status: AC
  Administered 2019-05-15: 400 mg via ORAL
  Filled 2019-05-15: qty 2

## 2019-05-15 MED ORDER — ACETAMINOPHEN 500 MG PO TABS
ORAL_TABLET | ORAL | Status: AC
  Administered 2019-05-15: 1000 mg via ORAL
  Filled 2019-05-15: qty 2

## 2019-05-15 SURGICAL SUPPLY — 61 items
BASEPLATE TIBIAL RT SZ 5 (Knees) ×1 IMPLANT
BLADE SAW 18WX90L 1.27 THK (BLADE) ×3 IMPLANT
BLADE SAW SAG 25X90X1.19 (BLADE) ×3 IMPLANT
BOWL CEMENT MIX W/ADAPTER (MISCELLANEOUS) ×3 IMPLANT
BRUSH SCRUB EZ  4% CHG (MISCELLANEOUS) ×4
BRUSH SCRUB EZ 4% CHG (MISCELLANEOUS) ×2 IMPLANT
CANISTER SUCT 1200ML W/VALVE (MISCELLANEOUS) ×3 IMPLANT
CANISTER SUCT 3000ML PPV (MISCELLANEOUS) ×6 IMPLANT
CEMENT BONE 1-PACK (Cement) ×6 IMPLANT
CHLORAPREP W/TINT 26 (MISCELLANEOUS) ×6 IMPLANT
COMP PATELLA FENESIS 32 OVAL (Stem) ×3 IMPLANT
COMPONENT FEM OXINIUM RT SZ5 (Knees) ×3 IMPLANT
COMPONENT PTLLA GENS 32 OVAL (Stem) ×1 IMPLANT
COOLER POLAR GLACIER W/PUMP (MISCELLANEOUS) ×3 IMPLANT
COVER WAND RF STERILE (DRAPES) ×3 IMPLANT
CUFF TOURN SGL QUICK 30 (TOURNIQUET CUFF) ×2
CUFF TRNQT CYL 30X4X21-28X (TOURNIQUET CUFF) ×1 IMPLANT
DRAPE 3/4 80X56 (DRAPES) ×6 IMPLANT
DRAPE INCISE IOBAN 66X60 STRL (DRAPES) ×3 IMPLANT
ELECT CAUTERY BLADE 6.4 (BLADE) ×3 IMPLANT
ELECT REM PT RETURN 9FT ADLT (ELECTROSURGICAL) ×3
ELECTRODE REM PT RTRN 9FT ADLT (ELECTROSURGICAL) ×1 IMPLANT
GAUZE SPONGE 4X4 12PLY STRL (GAUZE/BANDAGES/DRESSINGS) ×3 IMPLANT
GAUZE XEROFORM 1X8 LF (GAUZE/BANDAGES/DRESSINGS) ×3 IMPLANT
GLOVE BIO SURGEON STRL SZ8 (GLOVE) ×6 IMPLANT
GLOVE BIOGEL PI IND STRL 8.5 (GLOVE) ×1 IMPLANT
GLOVE BIOGEL PI INDICATOR 8.5 (GLOVE) ×2
GLOVE INDICATOR 8.0 STRL GRN (GLOVE) ×3 IMPLANT
GLOVE SURG ORTHO 8.0 STRL STRW (GLOVE) ×9 IMPLANT
GOWN STRL REUS W/ TWL LRG LVL3 (GOWN DISPOSABLE) ×2 IMPLANT
GOWN STRL REUS W/ TWL XL LVL3 (GOWN DISPOSABLE) ×1 IMPLANT
GOWN STRL REUS W/TWL LRG LVL3 (GOWN DISPOSABLE) ×4
GOWN STRL REUS W/TWL XL LVL3 (GOWN DISPOSABLE) ×2
HOOD PEEL AWAY FLYTE STAYCOOL (MISCELLANEOUS) ×9 IMPLANT
INSERT ART HI FLEX 9 SZ5-6 (Insert) ×3 IMPLANT
IV NS 1000ML (IV SOLUTION) ×2
IV NS 1000ML BAXH (IV SOLUTION) ×1 IMPLANT
KIT TURNOVER KIT A (KITS) ×3 IMPLANT
MAT ABSORB  FLUID 56X50 GRAY (MISCELLANEOUS) ×2
MAT ABSORB FLUID 56X50 GRAY (MISCELLANEOUS) ×1 IMPLANT
NDL SAFETY ECLIPSE 18X1.5 (NEEDLE) ×1 IMPLANT
NEEDLE HYPO 18GX1.5 SHARP (NEEDLE) ×2
NEEDLE SPNL 20GX3.5 QUINCKE YW (NEEDLE) ×3 IMPLANT
NS IRRIG 1000ML POUR BTL (IV SOLUTION) ×3 IMPLANT
PACK TOTAL KNEE (MISCELLANEOUS) ×3 IMPLANT
PAD DE MAYO PRESSURE PROTECT (MISCELLANEOUS) ×3 IMPLANT
PAD WRAPON POLAR KNEE (MISCELLANEOUS) ×1 IMPLANT
PULSAVAC PLUS IRRIG FAN TIP (DISPOSABLE) ×3
STAPLER SKIN PROX 35W (STAPLE) ×3 IMPLANT
SUCTION FRAZIER HANDLE 10FR (MISCELLANEOUS) ×2
SUCTION TUBE FRAZIER 10FR DISP (MISCELLANEOUS) ×1 IMPLANT
SUT DVC 2 QUILL PDO  T11 36X36 (SUTURE) ×2
SUT DVC 2 QUILL PDO T11 36X36 (SUTURE) ×1 IMPLANT
SUT VIC AB 2-0 CT1 18 (SUTURE) ×3 IMPLANT
SUT VIC AB 2-0 CT1 27 (SUTURE)
SUT VIC AB 2-0 CT1 TAPERPNT 27 (SUTURE) IMPLANT
SUT VIC AB PLUS 45CM 1-MO-4 (SUTURE) ×3 IMPLANT
SYR 30ML LL (SYRINGE) ×9 IMPLANT
TIBIAL BASEPLATE SZ 5 (Knees) ×3 IMPLANT
TIP FAN IRRIG PULSAVAC PLUS (DISPOSABLE) ×1 IMPLANT
WRAPON POLAR PAD KNEE (MISCELLANEOUS) ×3

## 2019-05-15 NOTE — Progress Notes (Signed)
Alert and oriented male admitted from PACU. Surgery today, Right total knee. Right leg with ace wrap and polar pak to knee.  Patient will be due to void anytime with last being cath'd at 1400 for 260 mls in PACU.  Skin otherwise intact. Pink foam to sacrum with skin intact under foam.   Menu ordered for pt so he can order dinner.

## 2019-05-15 NOTE — Transfer of Care (Signed)
Immediate Anesthesia Transfer of Care Note  Patient: Barry [name]  Procedure(s) Performed: TOTAL KNEE ARTHROPLASTY (Right Knee)  Patient Location: PACU  Anesthesia Type:Spinal  Level of Consciousness: awake, alert  and oriented  Airway & Oxygen Therapy: Patient Spontanous Breathing and Patient connected to face mask oxygen  Post-op Assessment: Report given to RN and Post -op Vital signs reviewed and stable  Post vital signs: Reviewed and stable  Last Vitals:  Vitals Value Taken Time  BP    Temp    Pulse    Resp    SpO2      Last Pain:  Vitals:   05/15/19 0900  TempSrc: Tympanic  PainSc: 5       Patients Stated Pain Goal: 1 (04/47/15 8063)  Complications: No apparent anesthesia complications

## 2019-05-15 NOTE — Evaluation (Signed)
Physical Therapy Evaluation Patient Details Name: Barry [name] MRN: [id number] DOB: 10-26-1945 Today's Date: 05/15/2019   History of Present Illness  Pt admitted for R TKR.  Clinical Impression  Pt is a pleasant 73 year old male who was admitted for R TKR. Pt performs bed mobility, transfers, and ambulation with cga and RW. Pt demonstrates ability to perform 10 SLRs with independence, therefore does not require KI for mobility. Pt demonstrates deficits with strength/mobility/ROM. Pt motivated to participate in therapy. Would benefit from skilled PT to address above deficits and promote optimal return to PLOF. Recommending OP PT post hospital discharge.    Follow Up Recommendations Outpatient PT    Equipment Recommendations  Rolling walker with 5" wheels;3in1 (PT)    Recommendations for Other Services       Precautions / Restrictions Precautions Precautions: Fall;Knee Precaution Booklet Issued: No Restrictions Weight Bearing Restrictions: Yes RLE Weight Bearing: Weight bearing as tolerated      Mobility  Bed Mobility Overal bed mobility: Needs Assistance Bed Mobility: Supine to Sit     Supine to sit: Min guard     General bed mobility comments: safe technique with cues for sequencing. Able to sit with upright posture  Transfers Overall transfer level: Needs assistance Equipment used: Rolling walker (2 wheeled) Transfers: Sit to/from Stand Sit to Stand: Min guard         General transfer comment: safe technique with RW.  Ambulation/Gait Ambulation/Gait assistance: Min guard Gait Distance (Feet): 5 Feet Assistive device: Rolling walker (2 wheeled) Gait Pattern/deviations: Step-to pattern     General Gait Details: ambulated with RW to recliner. Slight unsteadiness, however no overt LOB. SOme dizziness with exertion, improves quickly  Stairs            Wheelchair Mobility    Modified Rankin (Stroke Patients Only)       Balance Overall  balance assessment: Needs assistance Sitting-balance support: Feet supported Sitting balance-Leahy Scale: Good     Standing balance support: Bilateral upper extremity supported Standing balance-Leahy Scale: Good                               Pertinent Vitals/Pain Pain Assessment: No/denies pain    Home Living Family/patient expects to be discharged to:: Private residence Living Arrangements: Spouse/significant other;Children Available Help at Discharge: Family Type of Home: House Home Access: Ramped entrance     Home Layout: One level Home Equipment: (walking stick)      Prior Function Level of Independence: Independent with assistive device(s)         Comments: uses walking stick for long distances. Able to ambulate approx 10 min prior to fatigue     Hand Dominance        Extremity/Trunk Assessment   Upper Extremity Assessment Upper Extremity Assessment: Overall WFL for tasks assessed    Lower Extremity Assessment Lower Extremity Assessment: Generalized weakness(R LE grossly 3/5; L LE grossly 5/5)       Communication   Communication: No difficulties  Cognition Arousal/Alertness: Awake/alert Behavior During Therapy: WFL for tasks assessed/performed Overall Cognitive Status: Within Functional Limits for tasks assessed                                        General Comments      Exercises Total Joint Exercises Goniometric ROM: R knee AAROM 4-70 degrees  Other Exercises Other Exercises: supine ther-ex performed on R LE including AP, quad sets, SLRs, hip abd/add, SAQ, and knee flexion stretches. ALl ther-ex performed x 10 reps with cga   Assessment/Plan    PT Assessment Patient needs continued PT services  PT Problem List Decreased strength;Decreased range of motion;Decreased activity tolerance;Decreased balance;Decreased mobility;Decreased knowledge of use of DME;Pain       PT Treatment Interventions DME instruction;Gait  training;Therapeutic exercise;Stair training    PT Goals (Current goals can be found in the Care Plan section)  Acute Rehab PT Goals Patient Stated Goal: to get stronger PT Goal Formulation: With patient Time For Goal Achievement: 05/29/19 Potential to Achieve Goals: Good    Frequency BID   Barriers to discharge        Co-evaluation               AM-PAC PT "6 Clicks" Mobility  Outcome Measure Help needed turning from your back to your side while in a flat bed without using bedrails?: None Help needed moving from lying on your back to sitting on the side of a flat bed without using bedrails?: None Help needed moving to and from a bed to a chair (including a wheelchair)?: A Little Help needed standing up from a chair using your arms (e.g., wheelchair or bedside chair)?: A Little Help needed to walk in hospital room?: A Little Help needed climbing 3-5 steps with a railing? : A Lot 6 Click Score: 19    End of Session Equipment Utilized During Treatment: Gait belt Activity Tolerance: Patient tolerated treatment well Patient left: in chair;with chair alarm set;with SCD's reapplied Nurse Communication: Mobility status PT Visit Diagnosis: Muscle weakness (generalized) (M62.81);Difficulty in walking, not elsewhere classified (R26.2);Pain Pain - Right/Left: Right Pain - part of body: Knee    Time: 1456-1520 PT Time Calculation (min) (ACUTE ONLY): 24 min   Charges:   PT Evaluation $PT Eval Low Complexity: 1 Low PT Treatments $Therapeutic Exercise: 8-22 mins        Greggory Stallion, PT, DPT [phone]   [place] 05/15/2019, 4:22 PM

## 2019-05-15 NOTE — Op Note (Signed)
DATE OF SURGERY:  05/15/2019 TIME: 12:38 PM  PATIENT NAME:  Barry [name]   AGE: 73 y.o.    PRE-OPERATIVE DIAGNOSIS:  M17.11 Unilateral primary osteoarthritis right knee  POST-OPERATIVE DIAGNOSIS:  Same  PROCEDURE:  Procedure(s): TOTAL KNEE ARTHROPLASTY, Right  SURGEON:  Lovell Sheehan, MD   ASSISTANT:  Carlynn Spry, PA-C  OPERATIVE IMPLANTS: Tamala [employer]hew, Cruciate Retaining Oxinium Femoral component size  5, Fixed Bearing Tray size 5, Patella polyethylene 3-peg oval button size 32 mm, with a 9 mm HighFlex insert.   PREOPERATIVE INDICATIONS:  Barry [name] is an 73 y.o. male who has a diagnosis of M17.11 Unilateral primary osteoarthritis right knee and elected for a right total knee arthroplasty after failing nonoperative treatment, including activity modification, pain medication, physical therapy and injections who has significant impairment of their activities of daily living.  Radiographs have demonstrated tricompartmental osteoarthritis joint space narrowing, osteophytes, subchondral sclerosis and cyst formation.  The risks, benefits, and alternatives were discussed at length including but not limited to the risks of infection, bleeding, nerve or blood vessel injury, knee stiffness, fracture, dislocation, loosening or failure of the hardware and the need for further surgery. Medical risks include but not limited to DVT and pulmonary embolism, myocardial infarction, stroke, pneumonia, respiratory failure and death. I discussed these risks with the patient in my office prior to the date of surgery. They understood these risks and were willing to proceed.  OPERATIVE FINDINGS AND UNIQUE ASPECTS OF THE CASE:  All three compartments with advanced and severe degenerative changes, large osteophytes and an abundance of synovial fluid. Significant deformity was also noted. A decision was made to proceed with total knee arthroplasty.   OPERATIVE DESCRIPTION:  The patient was  brought to the operative room and placed in a supine position after undergoing placement of a general anesthetic. IV antibiotics were given. Patient received tranexamic acid. The lower extremity was prepped and draped in the usual sterile fashion.  A time out was performed to verify the patient's name, date of birth, medical record number, correct site of surgery and correct procedure to be performed. The timeout was also used to confirm the patient received antibiotics and that appropriate instruments, implants and radiographs studies were available in the room.  The leg was elevated and exsanguinated with an Esmarch and the tourniquet was inflated to 250 mmHg.  A midline incision was made over the left knee.. A medial parapatellar arthrotomy was then made and the patella subluxed laterally and the knee was brought into 90 of flexion. Hoffa's fat pad along with the anterior cruciate ligament was resected and the medial joint line was exposed.  Attention was then turned to preparation of the patella. The thickness of the patella was measured with a caliper, the diameter measured with the patella templates.  The patella resection was then made with an oscillating saw using the patella cutting guide.  The 32 mm button fit appropriately.  3 peg holes for the patella component were then drilled.  The extramedullary tibial cutting guide was then placed using the anterior tibial crest and second ray of the foot as a reference.  The tibial cutting guide was adjusted to allow for appropriate posterior slope.  The tibial cutting block was pinned into position. The slotted stylus was used to measure the proximal tibial resection of 9 mm off the high lateral side. Care was taken during the tibial resection to protect the medial and collateral ligaments.  The resected tibial bone was removed.  The distal femur was resected using the Visionaire cutting guide.  Care was taken to protect the collateral ligaments during  distal femoral resection.  The distal femoral resection was performed with an oscillating saw. The femoral cutting guide was then removed. Extension gap was measured with a 9 mm spacer block and alignment and extension was confirmed using a long alignment rod. The femur was sized to be a 5. Rotation of the referencing guide was checked with the epicondylar axis and Whitesides line. Then the 4-in-1 cutting jig was then applied to the distal femur. A stylus was used to confirm that the anterior femur would not be notched.   Then the anterior, posterior and chamfer femoral cuts were then made with an oscillating saw.  The knee was distracted and all posterior osteophytes were removed.  The flexion gap was then measured with a flexion spacer block and long alignment rod and was found to be symmetric with the extension gap and perpendicular to mechanical axis of the tibia.  The proximal tibia plateau was then sized with trial trays. The best coverage was achieved with a size 5. This tibial tray was then pinned into position. The proximal tibia was then prepared with the keel punch.  After tibial preparation was completed, all trial components were inserted with polyethylene trials. The knee achieved full extension and flexed to 120 degrees. Ligament were stable to varus and valgus at full extension as well as 30, 60 and 90 degrees of flexion.   The trials were then placed. Knee was taken through a full range of motion and deemed to be stable with the trial components. All trial components were then removed.  The joint was copiously irrigated with pulse lavage.  The final total knee arthroplasty components were then cemented into place. The knee was held in extension while cement was allowed to cure.The knee was taken through a range of motion and the patella tracked well and the knee was again irrigated copiously.  The knee capsule was then injected with Exparel.  The medial arthrotomy was closed with #1 Vicryl  and #2 Quill. The subcutaneous tissue closed with  2-0 vicryl, and skin approximated with staples.  A dry sterile and compressive dressing was applied.  A Polar Care was applied to the operative knee.  The patient was awakened and brought to the PACU in stable and satisfactory condition.  All sharp, lap and instrument counts were correct at the conclusion the case. I spoke with the patient's family in the postop consultation room to let them know the case had been performed without complication and the patient was stable in recovery room.   Total tourniquet time was 48 minutes.

## 2019-05-15 NOTE — H&P (Signed)
The patient has been re-examined, and the chart reviewed, and there have been no interval changes to the documented history and physical.  Plan a right total knee replacement today.  Anesthesia is consulted regarding a peripheral nerve block for post-operative pain.  The risks, benefits, and alternatives have been discussed at length, and the patient is willing to proceed.

## 2019-05-15 NOTE — Anesthesia Procedure Notes (Signed)
Anesthesia Regional Block: Adductor canal block   Pre-Anesthetic Checklist: ,, timeout performed, Correct Patient, Correct Site, Correct Laterality, Correct Procedure, Correct Position, site marked, Risks and benefits discussed,  Surgical consent,  Pre-op evaluation,  At surgeon's request and post-op pain management  Laterality: Lower and Right  Prep: chloraprep       Needles:  Injection technique: Single-shot  Needle Type: Echogenic Needle     Needle Length: 9cm  Needle Gauge: 21     Additional Needles:   Procedures:,,,, ultrasound used (permanent image in chart),,,,  Narrative:  Start time: 05/15/2019 10:15 AM End time: 05/15/2019 10:21 AM Injection made incrementally with aspirations every 5 mL.  Performed by: Personally  Anesthesiologist: Durenda Hurt, MD  Additional Notes: Patient consented for risk and benefits of nerve block including but not limited to nerve damage, failed block, bleeding and infection.  Patient voiced understanding.  Functioning IV was confirmed and monitors were applied.  A echogenic needle was used. Sterile prep,hand hygiene and sterile gloves were used. Minimal sedation used for procedure.   No paresthesia endorsed by patient during the procedure.  Negative aspiration and negative test dose prior to incremental administration of local anesthetic. The patient tolerated the procedure well with no immediate complications.

## 2019-05-15 NOTE — Anesthesia Post-op Follow-up Note (Signed)
Anesthesia QCDR form completed.        

## 2019-05-15 NOTE — Anesthesia Procedure Notes (Signed)
Spinal  Patient location during procedure: OR Start time: 05/15/2019 11:00 AM Staffing Anesthesiologist: Durenda Hurt, MD Resident/CRNA: Keirsten Matuska, Einar Grad, CRNA Performed: resident/CRNA  Preanesthetic Checklist Completed: patient identified, site marked, surgical consent, pre-op evaluation, timeout performed, IV checked, risks and benefits discussed and monitors and equipment checked Spinal Block Patient position: sitting Prep: DuraPrep Patient monitoring: heart rate, cardiac monitor, continuous pulse ox and blood pressure Approach: midline Location: L3-4 Injection technique: single-shot Needle Needle type: Sprotte  Needle gauge: 24 G Needle length: 9 cm Assessment Sensory level: T4

## 2019-05-15 NOTE — Anesthesia Preprocedure Evaluation (Addendum)
Anesthesia Evaluation  Patient identified by MRN, date of birth, ID band Patient awake    Reviewed: Allergy & Precautions, H&P , NPO status , Patient's Chart, lab work & pertinent test results  Airway Mallampati: II  TM Distance: >3 FB Neck ROM: full    Dental  (+) Missing   Pulmonary neg pulmonary ROS, neg COPD, former smoker,           Cardiovascular hypertension, (-) angina+ CAD, + Past MI (April [id number]) and + Cardiac Stents (April 2019)  (-) dysrhythmias      Neuro/Psych neg Seizures negative neurological ROS  negative psych ROS   GI/Hepatic Neg liver ROS, GERD  Controlled,  Endo/Other  diabetes  Renal/GU      Musculoskeletal   Abdominal   Peds  Hematology negative hematology ROS (+)   Anesthesia Other Findings Past Medical History: No date: Arthritis No date: BPH (benign prostatic hypertrophy) No date: Coronary artery disease No date: Dyspnea No date: GERD (gastroesophageal reflux disease) No date: Myocardial infarction [facility])     Comment:  April 2019  Past Surgical History: 04/12/2016: CATARACT EXTRACTION W/PHACO; Right     Comment:  Procedure: CATARACT EXTRACTION PHACO AND INTRAOCULAR               LENS PLACEMENT (IOC);  Surgeon: Birder Robson, MD;                Location: [facility] ORS;  Service: Ophthalmology;  Laterality:              Right;  [place]' 01:05 AP% 26.6 CDE 17.48 fluid pack lot #               [id number] 01/16/2018: CORONARY/GRAFT ACUTE MI REVASCULARIZATION; N/A     Comment:  Procedure: Coronary/Graft Acute MI Revascularization;                Surgeon: Nelva Bush, MD;  Location: [place]               CV LAB;  Service: Cardiovascular;  Laterality: N/A; 09/21/2012: KNEE SURGERY; Right 01/16/2018: LEFT HEART CATH AND CORONARY ANGIOGRAPHY; N/A     Comment:  Procedure: LEFT HEART CATH AND CORONARY ANGIOGRAPHY;                Surgeon: Nelva Bush, MD;  Location: [place]                CV LAB;  Service: Cardiovascular;  Laterality: N/A;     Reproductive/Obstetrics negative OB ROS                          Anesthesia Physical Anesthesia Plan  ASA: II  Anesthesia Plan: Spinal and Regional   Post-op Pain Management:    Induction:   PONV Risk Score and Plan: Propofol infusion  Airway Management Planned: Simple Face Mask and Natural Airway  Additional Equipment:   Intra-op Plan:   Post-operative Plan:   Informed Consent: I have reviewed the patients History and Physical, chart, labs and discussed the procedure including the risks, benefits and alternatives for the proposed anesthesia with the patient or authorized representative who has indicated his/her understanding and acceptance.     Dental Advisory Given  Plan Discussed with: Anesthesiologist and CRNA  Anesthesia Plan Comments:       Anesthesia Quick Evaluation

## 2019-05-16 ENCOUNTER — Encounter: Payer: Self-pay | Admitting: Orthopedic Surgery

## 2019-05-16 LAB — CBC
HCT: 33.3 % — ABNORMAL LOW (ref 39.0–52.0)
Hemoglobin: 11.2 g/dL — ABNORMAL LOW (ref 13.0–17.0)
MCH: 31.1 pg (ref 26.0–34.0)
MCHC: 33.6 g/dL (ref 30.0–36.0)
MCV: 92.5 fL (ref 80.0–100.0)
Platelets: 156 10*3/uL (ref 150–400)
RBC: 3.6 MIL/uL — ABNORMAL LOW (ref 4.22–5.81)
RDW: 11.9 % (ref 11.5–15.5)
WBC: 9.3 10*3/uL (ref 4.0–10.5)
nRBC: 0 % (ref 0.0–0.2)

## 2019-05-16 LAB — BASIC METABOLIC PANEL
Anion gap: 4 — ABNORMAL LOW (ref 5–15)
BUN: 17 mg/dL (ref 8–23)
CO2: 28 mmol/L (ref 22–32)
Calcium: 7.9 mg/dL — ABNORMAL LOW (ref 8.9–10.3)
Chloride: 106 mmol/L (ref 98–111)
Creatinine, Ser: 0.75 mg/dL (ref 0.61–1.24)
GFR calc Af Amer: >60 mL/min (ref >60)
GFR calc non Af Amer: >60 mL/min (ref >60)
Glucose, Bld: 175 mg/dL — ABNORMAL HIGH (ref 70–99)
Potassium: 3.9 mmol/L (ref 3.5–5.1)
Sodium: 138 mmol/L (ref 135–145)

## 2019-05-16 NOTE — Progress Notes (Signed)
Physical Therapy Treatment Patient Details Name: Barry [name] MRN: [id number] DOB: 13-Mar-1946 Today's Date: 05/16/2019    History of Present Illness Pt admitted for R TKR.    PT Comments    Participated in exercises as described below.  Stood and was able to ambulate to bathroom to void then continue 58' with walker and min guard with occasional VC's for sequencing and walker placement.Overall does well and remains in recliner after session.     Follow Up Recommendations  Outpatient PT     Equipment Recommendations  Rolling walker with 5" wheels;3in1 (PT)    Recommendations for Other Services       Precautions / Restrictions Precautions Precautions: Fall;Knee Restrictions Weight Bearing Restrictions: Yes RLE Weight Bearing: Weight bearing as tolerated    Mobility  Bed Mobility Overal bed mobility: Needs Assistance Bed Mobility: Supine to Sit     Supine to sit: Min guard     General bed mobility comments: safe technique with cues for sequencing. Able to sit with upright posture  Transfers Overall transfer level: Needs assistance     Sit to Stand: Min guard         General transfer comment: safe technique with RW with vc's for hand and foot placement.  Ambulation/Gait Ambulation/Gait assistance: Min guard Gait Distance (Feet): 50 Feet Assistive device: Rolling walker (2 wheeled) Gait Pattern/deviations: Step-to pattern     General Gait Details: slow   Stairs             Wheelchair Mobility    Modified Rankin (Stroke Patients Only)       Balance Overall balance assessment: Needs assistance Sitting-balance support: Feet supported Sitting balance-Leahy Scale: Good     Standing balance support: Bilateral upper extremity supported Standing balance-Leahy Scale: Fair                              Cognition Arousal/Alertness: Awake/alert   Overall Cognitive Status: Within Functional Limits for tasks assessed                                 General Comments: slow to answer at times      Exercises Total Joint Exercises Ankle Circles/Pumps: AROM;Both;10 reps Short Arc Quad: AROM;Right;10 reps Heel Slides: AROM;Right;10 reps Hip ABduction/ADduction: AROM;Right;10 reps Straight Leg Raises: AROM;Right;10 reps Long Arc Quad: AROM;Right;10 reps Knee Flexion: AAROM;Right;5 reps Goniometric ROM: 4-90    General Comments        Pertinent Vitals/Pain Pain Assessment: 0-10 Pain Score: 3  Pain Descriptors / Indicators: Operative site guarding;Sore Pain Intervention(s): Limited activity within patient's tolerance;Monitored during session    Home Living                      Prior Function            PT Goals (current goals can now be found in the care plan section) Progress towards PT goals: Progressing toward goals    Frequency    BID      PT Plan      Co-evaluation              AM-PAC PT "6 Clicks" Mobility   Outcome Measure  Help needed turning from your back to your side while in a flat bed without using bedrails?: None Help needed moving from lying on your back to sitting on the side  of a flat bed without using bedrails?: None Help needed moving to and from a bed to a chair (including a wheelchair)?: A Little Help needed standing up from a chair using your arms (e.g., wheelchair or bedside chair)?: A Little Help needed to walk in hospital room?: A Little Help needed climbing 3-5 steps with a railing? : A Little 6 Click Score: 20    End of Session Equipment Utilized During Treatment: Gait belt Activity Tolerance: Patient tolerated treatment well Patient left: in chair;with chair alarm set   Pain - Right/Left: Right Pain - part of body: Knee     Time: 1314-3888 PT Time Calculation (min) (ACUTE ONLY): 23 min  Charges:  $Gait Training: 8-22 mins $Therapeutic Exercise: 8-22 mins                    Chesley Noon, PTA 05/16/19, 10:42 AM

## 2019-05-16 NOTE — Progress Notes (Signed)
Physical Therapy Treatment Patient Details Name: Barry [name] MRN: [id number] DOB: Mar 01, 1946 Today's Date: 05/16/2019    History of Present Illness Pt admitted for R TKR.    PT Comments    Pt in bed, sleeping lightly but agrees to gait.  Bed mobility in and out of bed with min guard.  Stood and was able to increase gait to 100' with walker and min guard. Overall increased confidence with mobility but gait remains slow.  To bathroom to void.  Returned to bed and positioned to comfort.   Follow Up Recommendations  Outpatient PT     Equipment Recommendations  Rolling walker with 5" wheels;3in1 (PT)    Recommendations for Other Services       Precautions / Restrictions Precautions Precautions: Fall;Knee Precaution Booklet Issued: No Restrictions Weight Bearing Restrictions: Yes RLE Weight Bearing: Weight bearing as tolerated    Mobility  Bed Mobility Overal bed mobility: Needs Assistance Bed Mobility: Supine to Sit;Sit to Supine     Supine to sit: Min guard Sit to supine: Min guard   General bed mobility comments: safe technique with cues for sequencing. Able to sit with upright posture  Transfers Overall transfer level: Needs assistance Equipment used: Rolling walker (2 wheeled) Transfers: Sit to/from Stand Sit to Stand: Min guard         General transfer comment: safe technique with RW with vc's for hand and foot placement.  Ambulation/Gait Ambulation/Gait assistance: Min guard Gait Distance (Feet): 100 Feet Assistive device: Rolling walker (2 wheeled) Gait Pattern/deviations: Step-to pattern     General Gait Details: slow   Stairs             Wheelchair Mobility    Modified Rankin (Stroke Patients Only)       Balance Overall balance assessment: Needs assistance Sitting-balance support: Feet supported Sitting balance-Leahy Scale: Good     Standing balance support: Bilateral upper extremity supported Standing balance-Leahy  Scale: Fair                              Cognition Arousal/Alertness: Awake/alert Behavior During Therapy: WFL for tasks assessed/performed Overall Cognitive Status: Within Functional Limits for tasks assessed                                        Exercises Other Exercises Other Exercises: to commode to void    General Comments        Pertinent Vitals/Pain Pain Assessment: 0-10 Pain Score: 3  Pain Descriptors / Indicators: Operative site guarding;Sore Pain Intervention(s): Limited activity within patient's tolerance;Monitored during session;Repositioned;Ice applied    Home Living                      Prior Function            PT Goals (current goals can now be found in the care plan section) Progress towards PT goals: Progressing toward goals    Frequency    BID      PT Plan Current plan remains appropriate    Co-evaluation              AM-PAC PT "6 Clicks" Mobility   Outcome Measure  Help needed turning from your back to your side while in a flat bed without using bedrails?: None Help needed moving from lying on your back to  sitting on the side of a flat bed without using bedrails?: None Help needed moving to and from a bed to a chair (including a wheelchair)?: A Little Help needed standing up from a chair using your arms (e.g., wheelchair or bedside chair)?: A Little Help needed to walk in hospital room?: A Little Help needed climbing 3-5 steps with a railing? : A Little 6 Click Score: 20    End of Session Equipment Utilized During Treatment: Gait belt Activity Tolerance: Patient tolerated treatment well Patient left: in bed;with call bell/phone within reach;with bed alarm set   Pain - Right/Left: Right Pain - part of body: Knee     Time: 1345-1358 PT Time Calculation (min) (ACUTE ONLY): 13 min  Charges:  $Gait Training: 8-22 mins                     Chesley Noon, PTA 05/16/19, 3:53 PM

## 2019-05-16 NOTE — TOC Initial Note (Signed)
Transition of Care [facility]) - Initial/Assessment Note    Patient Details  Name: Barry [name] MRN: [id number] Date of Birth: 04-21-1946  [facility] CM/SW Contact:    Su Hilt, RN Phone Number: 05/16/2019, 10:04 AM  Clinical Narrative:                 Met with the patient to discuss DC plan and needs. He lives at home with his spouse and uses a cane at home, he will need a RW and a 3 in 1, I notified Brad with [employer] He has an outpatient PT appointment already in place and declines [facility] services No further needs Expected Discharge Plan: OP Rehab Barriers to Discharge: Barriers Resolved   Patient Goals and CMS Choice   CMS Medicare.gov Compare Post Acute Care list provided to:: Patient Choice offered to / list presented to : Patient  Expected Discharge Plan and Services Expected Discharge Plan: OP Rehab   Discharge Planning Services: CM Consult Post Acute Care Choice: [place] arrangements for the past 2 months: Single Family Home                 DME Arranged: 3-N-1, Walker rolling DME Agency: [employer] Date DME Agency Contacted: 05/16/19 Time DME Agency Contacted: [phone] Representative spoke with at DME Agency: [place]: NA          Prior Living Arrangements/Services Living arrangements for the past 2 months: [place] Lives with:: Spouse Patient language and need for interpreter reviewed:: No Do you feel safe going back to the place where you live?: Yes      Need for Family Participation in Patient Care: No (Comment) Care giver support system in place?: Yes (comment) Current home services: DME(walking stick) Criminal Activity/Legal Involvement Pertinent to Current Situation/Hospitalization: No - Comment as needed  Activities of Daily Living Home Assistive Devices/Equipment: Cane (specify quad or straight), Grab bars in shower ADL Screening (condition at time of admission) Patient's cognitive ability adequate to  safely complete daily activities?: Yes Is the patient deaf or have difficulty hearing?: No Does the patient have difficulty seeing, even when wearing glasses/contacts?: No Does the patient have difficulty concentrating, remembering, or making decisions?: No Patient able to express need for assistance with ADLs?: Yes Does the patient have difficulty dressing or bathing?: No Independently performs ADLs?: Yes (appropriate for developmental age) Does the patient have difficulty walking or climbing stairs?: Yes Weakness of Legs: Right Weakness of Arms/Hands: None  Permission Sought/Granted   Permission granted to share information with : Yes, Verbal Permission Granted              Emotional Assessment Appearance:: Appears stated age Attitude/Demeanor/Rapport: Engaged Affect (typically observed): Accepting, Appropriate, Happy, Hopeful Orientation: : Oriented to Self, Oriented to Place, Oriented to  Time, Oriented to Situation Alcohol / Substance Use: Not Applicable Psych Involvement: No (comment)  Admission diagnosis:  M17.11 Unilateral primary osteoarthritis right knee Patient Active Problem List   Diagnosis Date Noted  . Osteoarthritis of right knee 05/15/2019  . Right knee pain 04/16/2019  . Paroxysmal atrial fibrillation ([place]) 01/27/2018  . Ischemic cardiomyopathy 01/27/2018  . History of ST elevation myocardial infarction (STEMI) 01/16/2018  . Rosacea 07/04/2017  . Screening for prostate cancer 07/04/2017  . Arthritis 06/06/2016  . Type 2 diabetes mellitus with other specified complication ([place]) 38/07/1750  . Osteoarthritis of knee 03/25/2016  . Cerumen impaction 04/08/2015  . Essential (primary) hypertension 04/08/2015  . Difficulty hearing 04/08/2015  .  Gastric reflux 04/08/2015  . Family history of malignant neoplasm of prostate 04/08/2015  . Hemorrhoid 04/08/2015  . Hyperlipidemia associated with type 2 diabetes mellitus ([place]) 04/08/2015  . Pedal edema 04/08/2015  .  Blood in the urine 02/16/2015  . Benign prostatic hyperplasia with lower urinary tract symptoms 02/16/2015  . Intermittent urinary stream 02/16/2015  . Urinary hesitancy 02/16/2015   PCP:  Olin Hauser, DO Pharmacy:   [place], [place] - [address] [place] [address] Phone: [phone] Fax: [phone]     Social Determinants of Health (SDOH) Interventions    Readmission Risk Interventions No flowsheet data found.

## 2019-05-16 NOTE — Progress Notes (Signed)
  Subjective:  Patient reports pain as mild.    Objective:   VITALS:   Vitals:   05/15/19 1706 05/15/19 1815 05/15/19 2306 05/16/19 0404  BP: (!) 146/82 (!) 156/87 128/71 129/72  Pulse: 67 75 70 76  Resp: 18  18 18   Temp: 97.6 F (36.4 C) 97.8 F (36.6 C) 98 F (36.7 C) 98.2 F (36.8 C)  TempSrc: Oral     SpO2: 98% 99% 97% 97%  Weight:      Height:        PHYSICAL EXAM:  ABD soft Neurovascular intact Sensation intact distally Dorsiflexion/Plantar flexion intact Incision: dressing C/D/I No cellulitis present Compartment soft  LABS  Results for orders placed or performed during the hospital encounter of 05/15/19 (from the past 24 hour(s))  CBC     Status: Abnormal   Collection Time: 05/16/19  4:09 AM  Result Value Ref Range   WBC 9.3 4.0 - 10.5 K/uL   RBC 3.60 (L) 4.22 - 5.81 MIL/uL   Hemoglobin 11.2 (L) 13.0 - 17.0 g/dL   HCT 33.3 (L) 39.0 - 52.0 %   MCV 92.5 80.0 - 100.0 fL   MCH 31.1 26.0 - 34.0 pg   MCHC 33.6 30.0 - 36.0 g/dL   RDW 11.9 11.5 - 15.5 %   Platelets 156 150 - 400 K/uL   nRBC 0.0 0.0 - 0.2 %  Basic metabolic panel     Status: Abnormal   Collection Time: 05/16/19  4:09 AM  Result Value Ref Range   Sodium 138 135 - 145 mmol/L   Potassium 3.9 3.5 - 5.1 mmol/L   Chloride 106 98 - 111 mmol/L   CO2 28 22 - 32 mmol/L   Glucose, Bld 175 (H) 70 - 99 mg/dL   BUN 17 8 - 23 mg/dL   Creatinine, Ser 0.75 0.61 - 1.24 mg/dL   Calcium 7.9 (L) 8.9 - 10.3 mg/dL   GFR calc non Af Amer >60 >60 mL/min   GFR calc Af Amer >60 >60 mL/min   Anion gap 4 (L) 5 - 15    X-ray Knee Right Ap And Lateral  Result Date: 05/15/2019 CLINICAL DATA:  Postop right knee replacement. EXAM: RIGHT KNEE - 1-2 VIEW COMPARISON:  None. FINDINGS: Exam demonstrates evidence of patient's right total knee arthroplasty with prosthetic components normally located and intact. Air and fluid within the knee joint with skin staples vertically over the soft tissues of the anterior knee.  Remainder of the exam is unremarkable. IMPRESSION: Expected changes post right total knee arthroplasty. Electronically Signed   By: Marin Olp M.D.   On: 05/15/2019 14:14    Assessment/Plan: 1 Day Post-Op   Active Problems:   Osteoarthritis of right knee   Advance diet Up with therapy D/C IV fluids Plan for discharge tomorrow   Lovell Sheehan , MD 05/16/2019, 8:00 AM

## 2019-05-17 LAB — CBC
HCT: 30.9 % — ABNORMAL LOW (ref 39.0–52.0)
Hemoglobin: 10.6 g/dL — ABNORMAL LOW (ref 13.0–17.0)
MCH: 30.6 pg (ref 26.0–34.0)
MCHC: 34.3 g/dL (ref 30.0–36.0)
MCV: 89.3 fL (ref 80.0–100.0)
Platelets: 176 10*3/uL (ref 150–400)
RBC: 3.46 MIL/uL — ABNORMAL LOW (ref 4.22–5.81)
RDW: 12.2 % (ref 11.5–15.5)
WBC: 9.3 10*3/uL (ref 4.0–10.5)
nRBC: 0 % (ref 0.0–0.2)

## 2019-05-17 MED ORDER — HYDROCODONE-ACETAMINOPHEN 5-325 MG PO TABS: 1.0000 | ORAL_TABLET | ORAL | 0 refills | Status: DC | PRN

## 2019-05-17 MED ORDER — DOCUSATE SODIUM 100 MG PO CAPS: 100.0000 mg | ORAL_CAPSULE | Freq: Two times a day (BID) | ORAL | 0 refills | Status: DC

## 2019-05-17 NOTE — Discharge Instructions (Signed)
Continue weight bear as tolerated on the right lower extremity.    Elevate the right lower extremity whenever possible and continue the polar care while elevating the extremity. Patient may shower. No bath or submerging the wound.    Take Brilinta and aspirin as directed for blood clot prevention.  Continue to work on knee range of motion exercises at home as instructed by physical therapy. Continue to use a walker for assistance with ambulation until cleared by physical therapy.  Call [phone] with any questions, such as fever > 101.5 degrees, drainage from the wound or shortness of breath.

## 2019-05-17 NOTE — Progress Notes (Signed)
Pt. Discharged to home via private vehicle. Discharge instructions and medication regimen reviewed at bedside with patient. Pt. verbalizes understanding of instructions and medication regimen. Prescriptions with pt. Patient assessment unchanged from this morning. IV discontinued per policy.

## 2019-05-17 NOTE — Progress Notes (Signed)
  Subjective:  Patient reports pain as mild.  Doing well  Objective:   VITALS:   Vitals:   05/16/19 0806 05/16/19 1221 05/16/19 1718 05/16/19 2335  BP: 139/75 126/70 130/70 140/73  Pulse: 80 78 75 83  Resp:   18 17  Temp: 98.4 F (36.9 C) 98.3 F (36.8 C) 98.4 F (36.9 C) 99.4 F (37.4 C)  TempSrc:   Oral   SpO2: 97% 98% 98% 94%  Weight:      Height:        PHYSICAL EXAM:  Neurologically intact ABD soft Neurovascular intact Sensation intact distally Intact pulses distally Dorsiflexion/Plantar flexion intact Incision: no drainage No cellulitis present  Dressing changed  Patient had several skin blisters that were popped today  LABS  Results for orders placed or performed during the hospital encounter of 05/15/19 (from the past 24 hour(s))  CBC     Status: Abnormal   Collection Time: 05/17/19  4:12 AM  Result Value Ref Range   WBC 9.3 4.0 - 10.5 K/uL   RBC 3.46 (L) 4.22 - 5.81 MIL/uL   Hemoglobin 10.6 (L) 13.0 - 17.0 g/dL   HCT 30.9 (L) 39.0 - 52.0 %   MCV 89.3 80.0 - 100.0 fL   MCH 30.6 26.0 - 34.0 pg   MCHC 34.3 30.0 - 36.0 g/dL   RDW 12.2 11.5 - 15.5 %   Platelets 176 150 - 400 K/uL   nRBC 0.0 0.0 - 0.2 %    X-ray Knee Right Ap And Lateral  Result Date: 05/15/2019 CLINICAL DATA:  Postop right knee replacement. EXAM: RIGHT KNEE - 1-2 VIEW COMPARISON:  None. FINDINGS: Exam demonstrates evidence of patient's right total knee arthroplasty with prosthetic components normally located and intact. Air and fluid within the knee joint with skin staples vertically over the soft tissues of the anterior knee. Remainder of the exam is unremarkable. IMPRESSION: Expected changes post right total knee arthroplasty. Electronically Signed   By: Marin Olp M.D.   On: 05/15/2019 14:14    Assessment/Plan: 2 Days Post-Op   Active Problems:   Osteoarthritis of right knee   Advance diet Up with therapy  Discharge home today   Carlynn Spry , PA-C 05/17/2019, 7:49  AM

## 2019-05-17 NOTE — Progress Notes (Signed)
Physical Therapy Treatment Patient Details Name: Barry [name] MRN: [id number] DOB: Aug 17, 1946 Today's Date: 05/17/2019    History of Present Illness Pt s/p R TKR.    PT Comments    Pt in bed agreeable to treatment session. Reviewed handout with HEP detail. Pt has no memory of any of these exercises from prior sessions. Pt has very poor differentiation between hip flexion and knee extension at the levels of the quads, AEB gait patterns and inability to perform SAQ, [employer]. AMB progressed to 228ft. Mild safety concerns during transfers in terms of RW use, but pt generally stable and safe during AMB.       Follow Up Recommendations  Outpatient PT     Equipment Recommendations  Rolling walker with 5" wheels;3in1 (PT)    Recommendations for Other Services       Precautions / Restrictions Precautions Precautions: Fall;Knee Precaution Booklet Issued: Yes (comment) Restrictions RLE Weight Bearing: Weight bearing as tolerated    Mobility  Bed Mobility Overal bed mobility: Needs Assistance Bed Mobility: Supine to Sit;Sit to Supine     Supine to sit: Min guard Sit to supine: Min guard      Transfers Overall transfer level: Needs assistance Equipment used: Rolling walker (2 wheeled) Transfers: Sit to/from Stand Sit to Stand: Min guard         General transfer comment: safe technique with RW  Ambulation/Gait Ambulation/Gait assistance: Min guard;Supervision Gait Distance (Feet): 205 Feet Assistive device: Rolling walker (2 wheeled) Gait Pattern/deviations: Step-to pattern Gait velocity: 0.59m/s   General Gait Details: substantial compensation patterns that only worsen when given cues to reduce; isometric knee angle durign swing phase, 30% of hip flexion durign swing phase actually comes from posterior sway; pt utilizes a steppage gait on right with excessive Rt step length, forefoot strike.(responds best to cues for heel strike (minimally achieved >50% of  time) and longer Left step length.)   Stairs             Wheelchair Mobility    Modified Rankin (Stroke Patients Only)       Balance Overall balance assessment: Needs assistance Sitting-balance support: Single extremity supported;Feet supported Sitting balance-Leahy Scale: Good     Standing balance support: Single extremity supported;During functional activity Standing balance-Leahy Scale: Good                              Cognition Arousal/Alertness: Awake/alert Behavior During Therapy: WFL for tasks assessed/performed Overall Cognitive Status: Within Functional Limits for tasks assessed                                        Exercises Total Joint Exercises Ankle Circles/Pumps: AROM;Both;20 reps Quad Sets: AROM;Right;10 reps Gluteal Sets: AROM;Right;10 reps;Other (comment);Limitations Gluteal Sets Limitations: when cued for quad set Short Arc Quad: Right;10 reps;AAROM(max assist from author, essentially no isolated knee angle change, all compensation) Heel Slides: Right;10 reps;AAROM;Limitations Heel Slides Limitations: shown with self assist bedsheet, but still requires some assistance from author Hip ABduction/ADduction: AROM;Right;5 reps;AAROM Straight Leg Raises: AROM;Right;5 reps;Limitations Straight Leg Raises Limitations: poor quads control with knee quivering Goniometric ROM: Right knee P/ROM: 27-97    General Comments        Pertinent Vitals/Pain Pain Assessment: 0-10 Pain Score: 6  Pain Descriptors / Indicators: Operative site guarding;Sore Pain Intervention(s): Limited activity within patient's tolerance;Monitored during session;Premedicated  before session;Repositioned;Patient requesting pain meds-RN notified    Home Living                      Prior Function            PT Goals (current goals can now be found in the care plan section) Acute Rehab PT Goals Patient Stated Goal: to get stronger PT Goal  Formulation: With patient Time For Goal Achievement: 05/29/19 Potential to Achieve Goals: Good Additional Goals Additional Goal #1: Pt will be able to perform bed mobility/transfers with supervision and safe technique in order to improve functional independence Progress towards PT goals: Progressing toward goals    Frequency    BID      PT Plan Current plan remains appropriate    Co-evaluation              AM-PAC PT "6 Clicks" Mobility   Outcome Measure  Help needed turning from your back to your side while in a flat bed without using bedrails?: None Help needed moving from lying on your back to sitting on the side of a flat bed without using bedrails?: None Help needed moving to and from a bed to a chair (including a wheelchair)?: A Little Help needed standing up from a chair using your arms (e.g., wheelchair or bedside chair)?: A Little Help needed to walk in hospital room?: A Little Help needed climbing 3-5 steps with a railing? : A Little 6 Click Score: 20    End of Session Equipment Utilized During Treatment: Gait belt Activity Tolerance: Patient tolerated treatment well;No increased pain Patient left: with call bell/phone within reach;in chair;with chair alarm set Nurse Communication: Mobility status PT Visit Diagnosis: Muscle weakness (generalized) (M62.81);Difficulty in walking, not elsewhere classified (R26.2);Pain Pain - Right/Left: Right Pain - part of body: Knee     Time: 1003-1030 PT Time Calculation (min) (ACUTE ONLY): 27 min  Charges:  $Gait Training: 8-22 mins $Therapeutic Exercise: 8-22 mins                     12:43 PM, 05/17/19 Etta Grandchild, PT, DPT Physical Therapist - [facility]  [phone] ([place])    Shawan Tosh C 05/17/2019, 12:33 PM

## 2019-05-17 NOTE — Discharge Summary (Signed)
Physician Discharge Summary  Patient ID: Barry [name] MRN: [id number] DOB/AGE: 73-Dec-1947 73 y.o.  Admit date: 05/15/2019 Discharge date: 05/17/2019  Admission Diagnoses:  M17.11 Unilateral primary osteoarthritis right knee <principal problem not specified>  Discharge Diagnoses:  M17.11 Unilateral primary osteoarthritis right knee Active Problems:   Osteoarthritis of right knee   Past Medical History:  Diagnosis Date  . Arthritis   . BPH (benign prostatic hypertrophy)   . Coronary artery disease   . Dyspnea    d/t heart problem.  worsens in hot or cold extremes  . GERD (gastroesophageal reflux disease)   . Hypertension   . Myocardial infarction [facility])    April 2019. 1 stent placed    Surgeries: Procedure(s): TOTAL KNEE ARTHROPLASTY on 05/15/2019   Consultants (if any): Treatment Team:  Lovell Sheehan, MD  Discharged Condition: Improved  Hospital Course: Barry [name] is an 73 y.o. male who was admitted 05/15/2019 with a diagnosis of  M17.11 Unilateral primary osteoarthritis right knee <principal problem not specified> and went to the operating room on 05/15/2019 and underwent the above named procedures.    He was given perioperative antibiotics:  Anti-infectives (From admission, onward)   Start     Dose/Rate Route Frequency Ordered Stop   05/15/19 1210  50,000 units bacitracin in 0.9% normal saline 250 mL irrigation  Status:  Discontinued       As needed 05/15/19 1210 05/15/19 1240   05/15/19 0850  ceFAZolin (ANCEF) 2-4 GM/100ML-% IVPB    Note to Pharmacy: Barry [name]   : cabinet override      05/15/19 0850 05/15/19 1112   05/15/19 0600  ceFAZolin (ANCEF) IVPB 2g/100 mL premix     2 g 200 mL/hr over 30 Minutes Intravenous On call to O.R. 05/14/19 2316 05/15/19 1112    .  He was given sequential compression devices, early ambulation, and Brilinta and aspirin for DVT prophylaxis.  He benefited maximally from the hospital stay and there were no  complications.    Recent vital signs:  Vitals:   05/16/19 2335 05/17/19 0756  BP: 140/73 (!) 148/76  Pulse: 83 80  Resp: 17 18  Temp: 99.4 F (37.4 C) 98.9 F (37.2 C)  SpO2: 94% 96%    Recent laboratory studies:  Lab Results  Component Value Date   HGB 10.6 (L) 05/17/2019   HGB 11.2 (L) 05/16/2019   HGB 13.8 05/08/2019   Lab Results  Component Value Date   WBC 9.3 05/17/2019   PLT 176 05/17/2019   Lab Results  Component Value Date   INR 1.0 05/08/2019   Lab Results  Component Value Date   NA 138 05/16/2019   K 3.9 05/16/2019   CL 106 05/16/2019   CO2 28 05/16/2019   BUN 17 05/16/2019   CREATININE 0.75 05/16/2019   GLUCOSE 175 (H) 05/16/2019    Discharge Medications:   Allergies as of 05/17/2019      Reactions   Lisinopril Cough      Medication List    TAKE these medications   aspirin 81 MG tablet Take 1 tablet (81 mg total) by mouth daily.   atorvastatin 80 MG tablet Commonly known as: LIPITOR Take 1 tablet (80 mg total) by mouth daily at 6 PM.   carvedilol 6.25 MG tablet Commonly known as: Coreg Take 1 tablet (6.25 mg total) by mouth 2 (two) times daily.   docusate sodium 100 MG capsule Commonly known as: COLACE Take 1 capsule (100 mg total) by mouth  2 (two) times daily.   fluticasone 50 MCG/ACT nasal spray Commonly known as: FLONASE Place 2 sprays into both nostrils daily. Use for 4-6 weeks then stop and use seasonally or as needed.   HYDROcodone-acetaminophen 5-325 MG tablet Commonly known as: NORCO/VICODIN Take 1-2 tablets by mouth every 4 (four) hours as needed for moderate pain (pain score 4-6).   losartan 25 MG tablet Commonly known as: COZAAR Take 1 tablet (25 mg total) by mouth daily.   PEPCID AC PO Take 1 tablet by mouth as needed.   tamsulosin 0.4 MG Caps capsule Commonly known as: FLOMAX Take 1 capsule (0.4 mg total) by mouth daily.   ticagrelor 90 MG Tabs tablet Commonly known as: BRILINTA Take 1 tablet (90 mg total)  by mouth 2 (two) times daily.            Durable Medical Equipment  (From admission, onward)         Start     Ordered   05/17/19 0756  For home use only DME 3 n 1  Once     05/17/19 0756   05/17/19 0756  For home use only DME Walker rolling  Once    Question:  Patient needs a walker to treat with the following condition  Answer:  Osteoarthritis of right knee   05/17/19 0756          Diagnostic Studies: X-ray Knee Right Ap And Lateral  Result Date: 05/15/2019 CLINICAL DATA:  Postop right knee replacement. EXAM: RIGHT KNEE - 1-2 VIEW COMPARISON:  None. FINDINGS: Exam demonstrates evidence of patient's right total knee arthroplasty with prosthetic components normally located and intact. Air and fluid within the knee joint with skin staples vertically over the soft tissues of the anterior knee. Remainder of the exam is unremarkable. IMPRESSION: Expected changes post right total knee arthroplasty. Electronically Signed   By: Marin Olp M.D.   On: 05/15/2019 14:14    Disposition: Discharge disposition: 01-Home or Self Care            Signed: Carlynn Spry ,PA-C 05/17/2019, 7:57 AM

## 2019-05-17 NOTE — TOC Transition Note (Signed)
Transition of Care [facility]) - CM/SW Discharge Note   Patient Details  Name: Satya Buttram MRN: [id number] Date of Birth: 11/30/1945  [facility] CM/SW Contact:  Su Hilt, RN Phone Number: 05/17/2019, 1:00 PM   Clinical Narrative:    Patient discharged home with wife and will do outpatient PT, appointment already obtained before hospital stay, BSC and RW provided before DC, no additional needs   Final next level of care: Home/Self Care Barriers to Discharge: Barriers Resolved   Patient Goals and CMS Choice   CMS Medicare.gov Compare Post Acute Care list provided to:: Patient Choice offered to / list presented to : Patient  Discharge Placement                       Discharge Plan and Services   Discharge Planning Services: CM Consult Post Acute Care Choice: Home Health          DME Arranged: 3-N-1, Walker rolling DME Agency: [employer] Date DME Agency Contacted: 05/16/19 Time DME Agency Contacted: [phone] Representative spoke with at DME Agency: [place]: NA          Social Determinants of Health ([place]) Interventions     Readmission Risk Interventions No flowsheet data found.

## 2019-05-18 ENCOUNTER — Encounter: Payer: Self-pay | Admitting: Orthopedic Surgery

## 2019-05-18 NOTE — Anesthesia Postprocedure Evaluation (Signed)
Anesthesia Post Note  Patient: Barry [name]  Procedure(s) Performed: TOTAL KNEE ARTHROPLASTY (Right Knee)  Patient location during evaluation: PACU Anesthesia Type: Spinal Level of consciousness: awake and alert Pain management: pain level controlled Vital Signs Assessment: post-procedure vital signs reviewed and stable Respiratory status: spontaneous breathing, nonlabored ventilation and respiratory function stable Cardiovascular status: blood pressure returned to baseline and stable Postop Assessment: no apparent nausea or vomiting Anesthetic complications: no                 Durenda Hurt

## 2019-05-21 DIAGNOSIS — Z96659 Presence of unspecified artificial knee joint: Secondary | ICD-10-CM | POA: Insufficient documentation

## 2019-05-28 DIAGNOSIS — Z96651 Presence of right artificial knee joint: Secondary | ICD-10-CM | POA: Diagnosis not present

## 2019-05-30 DIAGNOSIS — Z96651 Presence of right artificial knee joint: Secondary | ICD-10-CM | POA: Diagnosis not present

## 2019-06-04 ENCOUNTER — Telehealth: Payer: Self-pay | Admitting: Cardiovascular Disease

## 2019-06-04 DIAGNOSIS — Z96651 Presence of right artificial knee joint: Secondary | ICD-10-CM | POA: Diagnosis not present

## 2019-06-04 NOTE — Telephone Encounter (Signed)
STAT if HR is under 50 or over 120 (normal HR is 60-100 beats per minute  What is your heart rate? 93,  1) Do you have a log of your heart rate readings (document readings)?  2) yesterday morning - 98  3) Do you have any other symptoms? No, BP is 119/77

## 2019-06-04 NOTE — Telephone Encounter (Signed)
He is welcome to have an EKG either through clinic or hospital to confirm normal sinus rhythm Could also try various pulse meters on his phone A regular rhythm would be normal, irregular pulse rate would be possibly atrial fibrillation

## 2019-06-04 NOTE — Telephone Encounter (Signed)
Called and spoke with wife, ok per DPR. Patient had knee replacement surgery on 05/15/19. They are concerned because HR has been 93 and 98, running in the 90's since the surgery. BP is stable around 119/77. Denies chest pain, palpitations, dizziness or shortness of breath. She says his pain is improving but he has to use more energy to get up and walk with the walker. Reviewed med list and it is correct. Advised that normal HR is 60-100 and that his BP/HR seem stable at this time.  Discussed that sometime pain can cause increase in HR. He has history of Afib and is wondering if patient is protected with Brilinta and aspirin if he is in Afib. Advised I will route to Dr Rockey Situ for review and any further advice.

## 2019-06-05 NOTE — Telephone Encounter (Signed)
Spoke with patients wife per release form and she reports he just started physical therapy. We reviewed previous call and advised that heart rates can range from 60-100's and with any type of surgery it can certainly take time for him to recover and get back to his old self. She states that he denies any other symptoms and heart rate last night was in the 80's which is normal. Advised for them to continue monitoring and if they have any further questions or concerns please give [place] a call back. She verbalized understanding of our conversation, agreement with plan, and had no further questions at this time.

## 2019-06-06 DIAGNOSIS — Z96651 Presence of right artificial knee joint: Secondary | ICD-10-CM | POA: Diagnosis not present

## 2019-06-11 DIAGNOSIS — Z96651 Presence of right artificial knee joint: Secondary | ICD-10-CM | POA: Diagnosis not present

## 2019-06-13 DIAGNOSIS — Z96651 Presence of right artificial knee joint: Secondary | ICD-10-CM | POA: Diagnosis not present

## 2019-06-18 DIAGNOSIS — Z96651 Presence of right artificial knee joint: Secondary | ICD-10-CM | POA: Diagnosis not present

## 2019-06-25 DIAGNOSIS — Z96651 Presence of right artificial knee joint: Secondary | ICD-10-CM | POA: Diagnosis not present

## 2019-06-25 DIAGNOSIS — Z96659 Presence of unspecified artificial knee joint: Secondary | ICD-10-CM | POA: Diagnosis not present

## 2019-06-27 DIAGNOSIS — Z96651 Presence of right artificial knee joint: Secondary | ICD-10-CM | POA: Diagnosis not present

## 2019-07-02 DIAGNOSIS — Z96651 Presence of right artificial knee joint: Secondary | ICD-10-CM | POA: Diagnosis not present

## 2019-07-04 DIAGNOSIS — Z96651 Presence of right artificial knee joint: Secondary | ICD-10-CM | POA: Diagnosis not present

## 2019-07-08 ENCOUNTER — Other Ambulatory Visit: Payer: PPO

## 2019-07-09 DIAGNOSIS — Z96651 Presence of right artificial knee joint: Secondary | ICD-10-CM | POA: Diagnosis not present

## 2019-07-11 DIAGNOSIS — Z96651 Presence of right artificial knee joint: Secondary | ICD-10-CM | POA: Diagnosis not present

## 2019-07-15 ENCOUNTER — Other Ambulatory Visit: Payer: Self-pay

## 2019-07-15 ENCOUNTER — Other Ambulatory Visit: Payer: PPO

## 2019-07-15 ENCOUNTER — Encounter: Payer: PPO | Admitting: Family Medicine

## 2019-07-15 DIAGNOSIS — R350 Frequency of micturition: Secondary | ICD-10-CM | POA: Diagnosis not present

## 2019-07-15 DIAGNOSIS — Z0000 Encounter for general adult medical examination without abnormal findings: Secondary | ICD-10-CM

## 2019-07-15 DIAGNOSIS — I480 Paroxysmal atrial fibrillation: Secondary | ICD-10-CM

## 2019-07-15 DIAGNOSIS — E1169 Type 2 diabetes mellitus with other specified complication: Secondary | ICD-10-CM | POA: Diagnosis not present

## 2019-07-15 DIAGNOSIS — E785 Hyperlipidemia, unspecified: Secondary | ICD-10-CM | POA: Diagnosis not present

## 2019-07-15 DIAGNOSIS — Z1159 Encounter for screening for other viral diseases: Secondary | ICD-10-CM

## 2019-07-15 DIAGNOSIS — I10 Essential (primary) hypertension: Secondary | ICD-10-CM

## 2019-07-15 DIAGNOSIS — N401 Enlarged prostate with lower urinary tract symptoms: Secondary | ICD-10-CM

## 2019-07-16 DIAGNOSIS — Z96651 Presence of right artificial knee joint: Secondary | ICD-10-CM | POA: Diagnosis not present

## 2019-07-16 LAB — COMPLETE METABOLIC PANEL WITH GFR
AG Ratio: 1.8 (calc) (ref 1.0–2.5)
ALT: 9 U/L (ref 9–46)
AST: 15 U/L (ref 10–35)
Albumin: 4.3 g/dL (ref 3.6–5.1)
Alkaline phosphatase (APISO): 91 U/L (ref 35–144)
BUN: 14 mg/dL (ref 7–25)
CO2: 25 mmol/L (ref 20–32)
Calcium: 9.2 mg/dL (ref 8.6–10.3)
Chloride: 107 mmol/L (ref 98–110)
Creat: 0.81 mg/dL (ref 0.70–1.18)
GFR, Est African American: 102 mL/min/{1.73_m2} (ref >=60)
GFR, Est Non African American: 88 mL/min/{1.73_m2} (ref >=60)
Globulin: 2.4 g/dL (calc) (ref 1.9–3.7)
Glucose, Bld: 121 mg/dL — ABNORMAL HIGH (ref 65–99)
Potassium: 4.4 mmol/L (ref 3.5–5.3)
Sodium: 141 mmol/L (ref 135–146)
Total Bilirubin: 0.6 mg/dL (ref 0.2–1.2)
Total Protein: 6.7 g/dL (ref 6.1–8.1)

## 2019-07-16 LAB — CBC WITH DIFFERENTIAL/PLATELET
Absolute Monocytes: 655 cells/uL (ref 200–950)
Basophils Absolute: 69 cells/uL (ref 0–200)
Basophils Relative: 0.9 %
Eosinophils Absolute: 131 cells/uL (ref 15–500)
Eosinophils Relative: 1.7 %
HCT: 38.5 % (ref 38.5–50.0)
Hemoglobin: 12.9 g/dL — ABNORMAL LOW (ref 13.2–17.1)
Lymphs Abs: 1740 cells/uL (ref 850–3900)
MCH: 30 pg (ref 27.0–33.0)
MCHC: 33.5 g/dL (ref 32.0–36.0)
MCV: 89.5 fL (ref 80.0–100.0)
MPV: 9.6 fL (ref 7.5–12.5)
Monocytes Relative: 8.5 %
Neutro Abs: 5105 cells/uL (ref 1500–7800)
Neutrophils Relative %: 66.3 %
Platelets: 278 10*3/uL (ref 140–400)
RBC: 4.3 10*6/uL (ref 4.20–5.80)
RDW: 12.3 % (ref 11.0–15.0)
Total Lymphocyte: 22.6 %
WBC: 7.7 10*3/uL (ref 3.8–10.8)

## 2019-07-16 LAB — LIPID PANEL
Cholesterol: 114 mg/dL (ref <200)
HDL: 37 mg/dL — ABNORMAL LOW (ref >=40)
LDL Cholesterol (Calc): 61 mg/dL (calc)
Non-HDL Cholesterol (Calc): 77 mg/dL (calc) (ref <130)
Total CHOL/HDL Ratio: 3.1 (calc) (ref <5.0)
Triglycerides: 79 mg/dL (ref <150)

## 2019-07-16 LAB — HEMOGLOBIN A1C
Hgb A1c MFr Bld: 6.4 % of total Hgb — ABNORMAL HIGH (ref <5.7)
Mean Plasma Glucose: 137 (calc)
eAG (mmol/L): 7.6 (calc)

## 2019-07-16 LAB — PSA: PSA: 0.6 ng/mL (ref <=4.0)

## 2019-07-16 LAB — HEPATITIS C ANTIBODY
Hepatitis C Ab: NONREACTIVE
SIGNAL TO CUT-OFF: 0.01 (ref <1.00)

## 2019-07-18 DIAGNOSIS — Z96651 Presence of right artificial knee joint: Secondary | ICD-10-CM | POA: Diagnosis not present

## 2019-07-22 ENCOUNTER — Encounter: Payer: Self-pay | Admitting: Family Medicine

## 2019-07-22 ENCOUNTER — Other Ambulatory Visit: Payer: Self-pay

## 2019-07-22 ENCOUNTER — Ambulatory Visit (INDEPENDENT_AMBULATORY_CARE_PROVIDER_SITE_OTHER): Payer: PPO | Admitting: Family Medicine

## 2019-07-22 VITALS — BP 131/70 | HR 85 | Temp 98.3°F | Resp 16 | Ht 69.0 in | Wt 177.5 lb

## 2019-07-22 DIAGNOSIS — E785 Hyperlipidemia, unspecified: Secondary | ICD-10-CM | POA: Diagnosis not present

## 2019-07-22 DIAGNOSIS — I480 Paroxysmal atrial fibrillation: Secondary | ICD-10-CM | POA: Diagnosis not present

## 2019-07-22 DIAGNOSIS — Z0000 Encounter for general adult medical examination without abnormal findings: Secondary | ICD-10-CM

## 2019-07-22 DIAGNOSIS — I10 Essential (primary) hypertension: Secondary | ICD-10-CM

## 2019-07-22 DIAGNOSIS — N401 Enlarged prostate with lower urinary tract symptoms: Secondary | ICD-10-CM

## 2019-07-22 DIAGNOSIS — E1169 Type 2 diabetes mellitus with other specified complication: Secondary | ICD-10-CM

## 2019-07-22 DIAGNOSIS — R350 Frequency of micturition: Secondary | ICD-10-CM

## 2019-07-22 NOTE — Assessment & Plan Note (Signed)
Stable to improved chronic BPH with lower urinary tract symptoms (LUTS) without any evidence of obstruction. - AUA BPH score 8 up to 11 - On Tamsulosin 0.4mg  daily - Last PSA 0.4 > 0.6 (06/2019) - Last DRE mild enlarged prostate, unremarkable (07/10/18)  Plan: 1. Continue Tamsulosin 0.4mg  daily for BPH LUTS - caution sudden position change dizziness side effect. Future consider 2 pills but defer for now, caution postural dizziness  May return to Urology in future if difficulty voiding or worse symptoms.

## 2019-07-22 NOTE — Patient Instructions (Addendum)
Thank you for coming to the office today.  Please contact Dr George Ina [facility] to schedule your Annual Diabetic Eye Exam and have them send [place] a copy of the result.  Please schedule a Follow-up Appointment to: Return in about 6 months (around 01/20/2020) for 6 month follow-up DM A1c.  If you have any other questions or concerns, please feel free to call the office or send a message through [place]. You may also schedule an earlier appointment if necessary.  Additionally, you may be receiving a survey about your experience at our office within a few days to 1 week by e-mail or mail. We value your feedback.  Nobie Putnam, DO [place]

## 2019-07-22 NOTE — Assessment & Plan Note (Addendum)
Previously Well-controlled DM with A1c 6.4 at goal No known complications or hypoglycemia. Complications - other including hyperlipidemia and vascular disease CAD, GERD - increases risk of future cardiovascular complications   Plan 1. No DM medication required. Diet controlled 2. Encourage improved lifestyle - low carb, low sugar diet, reduce portion size, continue improving regular exercise 3. Check CBG , bring log to next visit for review 4. Continue ASA, ARB, Statin 5. Needs to re-schedule for DM Eye from [facility] - last time 11/2018 they did not know he was diabetic - DM Foot exam done 6. Follow-up 6 months A1c

## 2019-07-22 NOTE — Progress Notes (Signed)
Subjective:    Patient ID: Barry [name], male    DOB: Feb 20, 1946, 73 y.o.   MRN: [id number]  Barry [name] is a 73 y.o. male presenting on 07/22/2019 for Annual Exam   HPI   Here for Annual Physical and lab review.  CHRONIC DM, Type 2: Previously A1c stable at 6.4 (previous 6.5) Not checking CBG at home Meds:None - diet controlled Currentlyon Losartan Lifestyle: - Weight down 3-4 lbs - Diet (improved DM diet) - Exercise (active with PT, unable to resume recumbent bike riding at this time due to knee surgery) - Followed by [facility], last visit 11/19/18 - but was not diabetic eye exam as they were not notified patient was diagnosed with diabetes. Denies hypoglycemia  CHRONIC HTN: Reportsno new concerns. Home BP readings normal controlled, similar to before Current Meds -Carvedilol6.25mg  BID, Losartan 25mg  daily Reports good compliance, took meds today. Tolerating well, w/o complaints. REDUCED lightheadedness on standing  CAD, history of STEMI / PAF / HLD Followed by Cardiology, next visit Dr Rockey Situ 10/2019 - He will discuss Brilinta and Aspirin further Denies any chest pain, dyspnea, anginal symptoms, palpitations  S/p R knee TKR History of Right Knee Pain, Arthritis S/p surgery 04/2019  BPH with LUTS/ Prostate Cancer Screening Last PSA 0.6 (06/2019) - He is doing well on Flomax 0.4mg  daily, he does admit some occasional postural dizziness - Prior treatments including Rapaflo, Silodosin Denies hematuria, dysuria, abdominal pain or flank pain  07/22/19 AUA BPH Symptom Score over past 1 month 1. Sensation of not emptying bladder post void -0 2. Urinate less than 2 hour after finish last void -1 3. Start/Stop several times during void -3 4. Difficult to postpone urination -3 5. Weak urinary stream -2 6. Push or strain urination -0 7. Nocturia -2times  Total Score:11(Moderate BPH symptoms) - on Flomax 0.4mg   Health  Maintenance: UTD Flu vaccine received 07/18/19 at pharmacy.  Depression screen [facility] 2/9 07/22/2019 04/16/2019 01/09/2019  Decreased Interest 0 0 0  Down, Depressed, Hopeless 0 0 0  PHQ - 2 Score 0 0 0  Altered sleeping - - -  Tired, decreased energy - - -  Change in appetite - - -  Feeling bad or failure about yourself  - - -  Trouble concentrating - - -  Moving slowly or fidgety/restless - - -  Suicidal thoughts - - -  PHQ-9 Score - - -  Difficult doing work/chores - - -    Past Medical History:  Diagnosis Date  . Arthritis   . BPH (benign prostatic hypertrophy)   . Coronary artery disease   . Dyspnea    d/t heart problem.  worsens in hot or cold extremes  . GERD (gastroesophageal reflux disease)   . Hypertension   . Myocardial infarction [facility])    April 2019. 1 stent placed   Past Surgical History:  Procedure Laterality Date  . CATARACT EXTRACTION W/PHACO Right 04/12/2016   Procedure: CATARACT EXTRACTION PHACO AND INTRAOCULAR LENS PLACEMENT (IOC);  Surgeon: Birder Robson, MD;  Location: [facility] ORS;  Service: Ophthalmology;  Laterality: Right;  [place]' 01:05AP% 26.6CDE 17.67fluid pack lot # [id number]  . CORONARY/GRAFT ACUTE MI REVASCULARIZATION N/A 01/16/2018   Procedure: Coronary/Graft Acute MI Revascularization;  Surgeon: Nelva Bush, MD;  Location: [place] CV LAB;  Service: Cardiovascular;  Laterality: N/A;  . EYE SURGERY    . KNEE SURGERY Right 09/21/2012   arthroscopy  . LEFT HEART CATH AND CORONARY ANGIOGRAPHY N/A 01/16/2018   Procedure: LEFT  HEART CATH AND CORONARY ANGIOGRAPHY;  Surgeon: Nelva Bush, MD;  Location: [place] CV LAB;  Service: Cardiovascular;  Laterality: N/A;  . TOTAL KNEE ARTHROPLASTY Right 05/15/2019   Procedure: TOTAL KNEE ARTHROPLASTY;  Surgeon: Lovell Sheehan, MD;  Location: [facility] ORS;  Service: Orthopedics;  Laterality: Right;   Social History   Socioeconomic History  . Marital status: Married    Spouse name: Baldo Ash  . Number of  children: Not on file  . Years of education: 12th grade  . Highest education level: High school graduate  Occupational History  . Occupation: retired  Scientific laboratory technician  . Financial resource strain: Not hard at all  . Food insecurity    Worry: Never true    Inability: Never true  . Transportation needs    Medical: No    Non-medical: No  Tobacco Use  . Smoking status: Former Smoker    Quit date: 05/07/1974    Years since quitting: 45.2  . Smokeless tobacco: Former Systems developer    Types: [place] date: 10/17/1993  Substance and Sexual Activity  . Alcohol use: No  . Drug use: No  . Sexual activity: Not on file  Lifestyle  . Physical activity    Days per week: 5 days    Minutes per session: 30 min  . Stress: Patient refused  Relationships  . Social connections    Talks on phone: More than three times a week    Gets together: More than three times a week    Attends religious service: More than 4 times per year    Active member of club or organization: No    Attends meetings of clubs or organizations: Never    Relationship status: Married  . Intimate partner violence    Fear of current or ex partner: No    Emotionally abused: No    Physically abused: No    Forced sexual activity: No  Other Topics Concern  . Not on file  Social History Narrative  . Not on file   Family History  Problem Relation Age of Onset  . Diabetes Mother   . Stroke Mother   . Prostate cancer Brother   . Melanoma Brother   . Hyperlipidemia Paternal Aunt    Current Outpatient Medications on File Prior to Visit  Medication Sig  . aspirin 81 MG tablet Take 1 tablet (81 mg total) by mouth daily.  Marland Kitchen atorvastatin (LIPITOR) 80 MG tablet Take 1 tablet (80 mg total) by mouth daily at 6 PM.  . carvedilol (COREG) 6.25 MG tablet Take 1 tablet (6.25 mg total) by mouth 2 (two) times daily.  Marland Kitchen docusate sodium (COLACE) 100 MG capsule Take 1 capsule (100 mg total) by mouth 2 (two) times daily.  . Famotidine (PEPCID AC  PO) Take 1 tablet by mouth as needed.  . fluticasone (FLONASE) 50 MCG/ACT nasal spray Place 2 sprays into both nostrils daily. Use for 4-6 weeks then stop and use seasonally or as needed.  Marland Kitchen losartan (COZAAR) 25 MG tablet Take 1 tablet (25 mg total) by mouth daily.  . tamsulosin (FLOMAX) 0.4 MG CAPS capsule Take 1 capsule (0.4 mg total) by mouth daily.  . ticagrelor (BRILINTA) 90 MG TABS tablet Take 1 tablet (90 mg total) by mouth 2 (two) times daily.   No current facility-administered medications on file prior to visit.     Review of Systems  Constitutional: Negative for activity change, appetite change, chills, diaphoresis, fatigue and fever.  HENT:  Negative for congestion and hearing loss.   Eyes: Negative for visual disturbance.  Respiratory: Negative for apnea, cough, chest tightness, shortness of breath and wheezing.   Cardiovascular: Negative for chest pain, palpitations and leg swelling.  Gastrointestinal: Negative for abdominal pain, anal bleeding, blood in stool, constipation, diarrhea, nausea and vomiting.  Endocrine: Negative for cold intolerance.  Genitourinary: Negative for difficulty urinating, dysuria, frequency and hematuria.  Musculoskeletal: Positive for arthralgias and joint swelling (R knee). Negative for neck pain.  Skin: Negative for rash.  Allergic/Immunologic: Negative for environmental allergies.  Neurological: Negative for dizziness, weakness, light-headedness, numbness and headaches.  Hematological: Negative for adenopathy.  Psychiatric/Behavioral: Negative for behavioral problems, dysphoric mood and sleep disturbance. The patient is not nervous/anxious.    Per HPI unless specifically indicated above      Objective:    BP 131/70   Pulse 85   Temp 98.3 F (36.8 C) (Oral)   Resp 16   Ht 5\' 9"  (1.753 m)   Wt 177 lb 8 oz (80.5 kg)   BMI 26.21 kg/m   Wt Readings from Last 3 Encounters:  07/22/19 177 lb 8 oz (80.5 kg)  05/15/19 181 lb 14.1 oz (82.5 kg)   05/08/19 182 lb (82.6 kg)    Physical Exam Vitals signs and nursing note reviewed.  Constitutional:      General: He is not in acute distress.    Appearance: He is well-developed. He is not diaphoretic.     Comments: Well-appearing, comfortable, cooperative  HENT:     Head: Normocephalic and atraumatic.  Eyes:     General:        Right eye: No discharge.        Left eye: No discharge.     Conjunctiva/sclera: Conjunctivae normal.     Pupils: Pupils are equal, round, and reactive to light.  Neck:     Musculoskeletal: Normal range of motion and neck supple.     Thyroid: No thyromegaly.  Cardiovascular:     Rate and Rhythm: Normal rate and regular rhythm.     Heart sounds: Normal heart sounds. No murmur.     Comments: No irregularity today Pulmonary:     Effort: Pulmonary effort is normal. No respiratory distress.     Breath sounds: Normal breath sounds. No wheezing or rales.  Abdominal:     General: Bowel sounds are normal. There is no distension.     Palpations: Abdomen is soft. There is no mass.     Tenderness: There is no abdominal tenderness.  Musculoskeletal: Normal range of motion.        General: No tenderness.     Right lower leg: Edema (trace) present.     Left lower leg: No edema.     Comments: Right knee - bulky appearance, surgical scar intact, reduced ROM due to knee replacement.  Lymphadenopathy:     Cervical: No cervical adenopathy.  Skin:    General: Skin is warm and dry.     Findings: No erythema or rash.  Neurological:     Mental Status: He is alert and oriented to person, place, and time.     Comments: Distal sensation intact to light touch all extremities  Psychiatric:        Behavior: Behavior normal.     Comments: Well groomed, good eye contact, normal speech and thoughts      Diabetic Foot Exam - Simple   Simple Foot Form Diabetic Foot exam was performed with the following findings: Yes 07/22/2019  2:37 PM  Visual Inspection No deformities, no  ulcerations, no other skin breakdown bilaterally: Yes Sensation Testing Intact to touch and monofilament testing bilaterally: Yes Pulse Check Posterior Tibialis and Dorsalis pulse intact bilaterally: Yes Comments      Results for orders placed or performed in visit on 07/15/19  PSA  Result Value Ref Range   PSA 0.6 < OR = 4.0 ng/mL  Lipid panel  Result Value Ref Range   Cholesterol 114 <200 mg/dL   HDL 37 (L) > OR = 40 mg/dL   Triglycerides 79 <150 mg/dL   LDL Cholesterol (Calc) 61 mg/dL (calc)   Total CHOL/HDL Ratio 3.1 <5.0 (calc)   Non-HDL Cholesterol (Calc) 77 <130 mg/dL (calc)  COMPLETE METABOLIC PANEL WITH GFR  Result Value Ref Range   Glucose, Bld 121 (H) 65 - 99 mg/dL   BUN 14 7 - 25 mg/dL   Creat 0.81 0.70 - 1.18 mg/dL   GFR, Est Non African American 88 > OR = 60 mL/min/1.24m2   GFR, Est African American 102 > OR = 60 mL/min/1.28m2   BUN/Creatinine Ratio NOT APPLICABLE 6 - 22 (calc)   Sodium 141 135 - 146 mmol/L   Potassium 4.4 3.5 - 5.3 mmol/L   Chloride 107 98 - 110 mmol/L   CO2 25 20 - 32 mmol/L   Calcium 9.2 8.6 - 10.3 mg/dL   Total Protein 6.7 6.1 - 8.1 g/dL   Albumin 4.3 3.6 - 5.1 g/dL   Globulin 2.4 1.9 - 3.7 g/dL (calc)   AG Ratio 1.8 1.0 - 2.5 (calc)   Total Bilirubin 0.6 0.2 - 1.2 mg/dL   Alkaline phosphatase (APISO) 91 35 - 144 U/L   AST 15 10 - 35 U/L   ALT 9 9 - 46 U/L  CBC with Differential/Platelet  Result Value Ref Range   WBC 7.7 3.8 - 10.8 Thousand/uL   RBC 4.30 4.20 - 5.80 Million/uL   Hemoglobin 12.9 (L) 13.2 - 17.1 g/dL   HCT 38.5 38.5 - 50.0 %   MCV 89.5 80.0 - 100.0 fL   MCH 30.0 27.0 - 33.0 pg   MCHC 33.5 32.0 - 36.0 g/dL   RDW 12.3 11.0 - 15.0 %   Platelets 278 140 - 400 Thousand/uL   MPV 9.6 7.5 - 12.5 fL   Neutro Abs 5,105 1,500 - 7,800 cells/uL   Lymphs Abs 1,740 850 - 3,900 cells/uL   Absolute Monocytes 655 200 - 950 cells/uL   Eosinophils Absolute 131 15 - 500 cells/uL   Basophils Absolute 69 0 - 200 cells/uL    Neutrophils Relative % 66.3 %   Total Lymphocyte 22.6 %   Monocytes Relative 8.5 %   Eosinophils Relative 1.7 %   Basophils Relative 0.9 %  Hemoglobin A1c  Result Value Ref Range   Hgb A1c MFr Bld 6.4 (H) <5.7 % of total Hgb   Mean Plasma Glucose 137 (calc)   eAG (mmol/L) 7.6 (calc)  Hepatitis C Antibody  Result Value Ref Range   Hepatitis C Ab NON-REACTIVE NON-REACTI   SIGNAL TO CUT-OFF 0.01 <1.00      Assessment & Plan:   Problem List Items Addressed This Visit    Benign prostatic hyperplasia with lower urinary tract symptoms    Stable to improved chronic BPH with lower urinary tract symptoms (LUTS) without any evidence of obstruction. - AUA BPH score 8 up to 11 - On Tamsulosin 0.4mg  daily - Last PSA 0.4 > 0.6 (06/2019) - Last DRE mild enlarged  prostate, unremarkable (07/10/18)  Plan: 1. Continue Tamsulosin 0.4mg  daily for BPH LUTS - caution sudden position change dizziness side effect. Future consider 2 pills but defer for now, caution postural dizziness  May return to Urology in future if difficulty voiding or worse symptoms.      Essential (primary) hypertension    Controlled HTN No known complications  Plan: 1. Continue Carvedilol higher dose 6.25mg  BID -  indication with s/p MI - Continue on Losartan 25mg  daily - Encouraged low sodium diet, regular exercise Monitor BP at home      Hyperlipidemia associated with type 2 diabetes mellitus ([place])    Controlled cholesterol on statin and lifestyle Last lipid panel 06/2019 Known CAD  Plan: 1. Continue current meds - Atorvastating 80mg  daily 2. Continue DAPT - Brilinta / ASA 81mg  for primary ASCVD risk reduction 3. Encourage improved lifestyle - low carb/cholesterol, reduce portion size, continue improving regular exercise      Paroxysmal atrial fibrillation (HCC)    Stable Followed by Cardiology On anti-platelet brilinta and aspirin - he can discuss with cardiology on dose adjust if needed On rate control       Type 2 diabetes mellitus with other specified complication ([place])    Previously Well-controlled DM with A1c 6.4 at goal No known complications or hypoglycemia. Complications - other including hyperlipidemia and vascular disease CAD, GERD - increases risk of future cardiovascular complications   Plan 1. No DM medication required. Diet controlled 2. Encourage improved lifestyle - low carb, low sugar diet, reduce portion size, continue improving regular exercise 3. Check CBG , bring log to next visit for review 4. Continue ASA, ARB, Statin 5. Needs to re-schedule for DM Eye from [facility] - last time 11/2018 they did not know he was diabetic - DM Foot exam done 6. Follow-up 6 months A1c       Other Visit Diagnoses    Annual physical exam    -  Primary      Updated Health Maintenance information Reviewed recent lab results with patient Encouraged improvement to lifestyle with diet and exercise - Goal of weight loss   No orders of the defined types were placed in this encounter.   Follow up plan: Return in about 6 months (around 01/20/2020) for 6 month follow-up DM A1c.  Nobie Putnam, DO [facility] 07/22/2019, 2:13 PM

## 2019-07-22 NOTE — Assessment & Plan Note (Signed)
Controlled HTN No known complications  Plan: 1. Continue Carvedilol higher dose 6.25mg  BID -  indication with s/p MI - Continue on Losartan 25mg  daily - Encouraged low sodium diet, regular exercise Monitor BP at home

## 2019-07-22 NOTE — Assessment & Plan Note (Signed)
Stable Followed by Cardiology On anti-platelet brilinta and aspirin - he can discuss with cardiology on dose adjust if needed On rate control

## 2019-07-22 NOTE — Assessment & Plan Note (Signed)
Controlled cholesterol on statin and lifestyle Last lipid panel 06/2019 Known CAD  Plan: 1. Continue current meds - Atorvastating 80mg  daily 2. Continue DAPT - Brilinta / ASA 81mg  for primary ASCVD risk reduction 3. Encourage improved lifestyle - low carb/cholesterol, reduce portion size, continue improving regular exercise

## 2019-07-23 ENCOUNTER — Encounter: Payer: Self-pay | Admitting: Family Medicine

## 2019-07-23 DIAGNOSIS — Z96651 Presence of right artificial knee joint: Secondary | ICD-10-CM | POA: Diagnosis not present

## 2019-07-25 DIAGNOSIS — Z96651 Presence of right artificial knee joint: Secondary | ICD-10-CM | POA: Diagnosis not present

## 2019-07-30 DIAGNOSIS — D225 Melanocytic nevi of trunk: Secondary | ICD-10-CM | POA: Diagnosis not present

## 2019-07-30 DIAGNOSIS — Z96651 Presence of right artificial knee joint: Secondary | ICD-10-CM | POA: Diagnosis not present

## 2019-07-30 DIAGNOSIS — D2271 Melanocytic nevi of right lower limb, including hip: Secondary | ICD-10-CM | POA: Diagnosis not present

## 2019-07-30 DIAGNOSIS — L570 Actinic keratosis: Secondary | ICD-10-CM | POA: Diagnosis not present

## 2019-07-30 DIAGNOSIS — D2262 Melanocytic nevi of left upper limb, including shoulder: Secondary | ICD-10-CM | POA: Diagnosis not present

## 2019-07-30 DIAGNOSIS — D2261 Melanocytic nevi of right upper limb, including shoulder: Secondary | ICD-10-CM | POA: Diagnosis not present

## 2019-07-30 DIAGNOSIS — D2272 Melanocytic nevi of left lower limb, including hip: Secondary | ICD-10-CM | POA: Diagnosis not present

## 2019-07-30 DIAGNOSIS — X32XXXA Exposure to sunlight, initial encounter: Secondary | ICD-10-CM | POA: Diagnosis not present

## 2019-07-30 DIAGNOSIS — L821 Other seborrheic keratosis: Secondary | ICD-10-CM | POA: Diagnosis not present

## 2019-07-30 DIAGNOSIS — L718 Other rosacea: Secondary | ICD-10-CM | POA: Diagnosis not present

## 2019-08-01 DIAGNOSIS — Z96651 Presence of right artificial knee joint: Secondary | ICD-10-CM | POA: Diagnosis not present

## 2019-08-06 DIAGNOSIS — Z96651 Presence of right artificial knee joint: Secondary | ICD-10-CM | POA: Diagnosis not present

## 2019-08-08 DIAGNOSIS — Z96651 Presence of right artificial knee joint: Secondary | ICD-10-CM | POA: Diagnosis not present

## 2019-08-12 ENCOUNTER — Telehealth: Payer: Self-pay | Admitting: Cardiovascular Disease

## 2019-08-12 NOTE — Telephone Encounter (Signed)
From a cardiovascular standpoint he does not need any antibiotics prior to dental work as he does not have any artifical heart valves.  However some orthopedic surgeons like patients to take prophylactic antibiotics prior to dental work to protect the artifical knee from getting infected. Looks like patient is s/p knee replacement. Patient should call his orthopedic surgeon to discuss this/get a prescription.

## 2019-08-12 NOTE — Telephone Encounter (Signed)
Patient daughter calling Would like to know if patient needs to take an antibiotic before going to dentist for cleaning States it was mentioned before but is unsure if needed  Please call to discuss

## 2019-08-13 ENCOUNTER — Telehealth: Payer: Self-pay | Admitting: Family Medicine

## 2019-08-13 DIAGNOSIS — Z96651 Presence of right artificial knee joint: Secondary | ICD-10-CM | POA: Diagnosis not present

## 2019-08-13 NOTE — Chronic Care Management (AMB) (Signed)
Chronic Care Management   Note  08/13/2019 Name: Barry [name] MRN: [id number] DOB: 03/12/1946  Barry [name] is a 73 y.o. year old male who is a primary care patient of Olin Hauser, DO. I reached out to [place] by phone today in response to a referral sent by [name]'s health plan.     Barry [name] was given information about Chronic Care Management services today including:  1. CCM service includes personalized support from designated clinical staff supervised by his physician, including individualized plan of care and coordination with other care providers 2. 24/7 contact phone numbers for assistance for urgent and routine care needs. 3. Service will only be billed when office clinical staff spend 20 minutes or more in a month to coordinate care. 4. Only one practitioner may furnish and bill the service in a calendar month. 5. The patient may stop CCM services at any time (effective at the end of the month) by phone call to the office staff. 6. The patient will be responsible for cost sharing (co-pay) of up to 20% of the service fee (after annual deductible is met).  Patient did not agree to enrollment in care management services and does not wish to consider at this time.  Follow up plan: The patient has been provided with contact information for the chronic care management team and has been advised to call with any health related questions or concerns.   Garden  [email or web address]   ??[phone]

## 2019-08-13 NOTE — Telephone Encounter (Signed)
Call attempted, no answer, no VM.

## 2019-08-15 DIAGNOSIS — Z96651 Presence of right artificial knee joint: Secondary | ICD-10-CM | POA: Diagnosis not present

## 2019-08-20 DIAGNOSIS — Z96651 Presence of right artificial knee joint: Secondary | ICD-10-CM | POA: Diagnosis not present

## 2019-08-22 DIAGNOSIS — Z96651 Presence of right artificial knee joint: Secondary | ICD-10-CM | POA: Diagnosis not present

## 2019-08-27 ENCOUNTER — Ambulatory Visit: Payer: PPO

## 2019-08-27 DIAGNOSIS — Z96659 Presence of unspecified artificial knee joint: Secondary | ICD-10-CM | POA: Diagnosis not present

## 2019-08-27 DIAGNOSIS — Z96651 Presence of right artificial knee joint: Secondary | ICD-10-CM | POA: Diagnosis not present

## 2019-09-03 ENCOUNTER — Ambulatory Visit (INDEPENDENT_AMBULATORY_CARE_PROVIDER_SITE_OTHER): Payer: PPO

## 2019-09-03 VITALS — BP 136/76 | HR 77 | Ht 69.0 in | Wt 177.0 lb

## 2019-09-03 DIAGNOSIS — Z0000 Encounter for general adult medical examination without abnormal findings: Secondary | ICD-10-CM

## 2019-09-03 NOTE — Progress Notes (Signed)
Subjective:   Barry [name] is a 73 y.o. male who presents for Medicare Annual/Subsequent preventive examination.  This visit is being conducted via phone call  - after an attempt to do on video chat - due to the COVID-19 pandemic. This patient has given me verbal consent via phone to conduct this visit, patient states they are participating from their home address. Some vital signs may be absent or patient reported.   Patient identification: identified by name, DOB, and current address.    Review of Systems:   Cardiac Risk Factors include: diabetes mellitus;advanced age (>32men, >87 women);dyslipidemia;hypertension     Objective:    Vitals: BP 136/76    Pulse 77    Ht 5\' 9"  (1.753 m)    Wt 177 lb (80.3 kg)    BMI 26.14 kg/m   Body mass index is 26.14 kg/m.  Advanced Directives 05/15/2019 05/08/2019 08/14/2018 02/05/2018 01/16/2018 06/27/2017 04/12/2016  Does Patient Have a Medical Advance Directive? No No No No No No No  Would patient like information on creating a medical advance directive? No - Patient declined - No - Patient declined No - Patient declined No - Patient declined No - Patient declined No - patient declined information    Tobacco Social History   Tobacco Use  Smoking Status Former Smoker   Types: Cigars   Quit date: 05/07/1974   Years since quitting: 45.3  Smokeless Tobacco Former User   Types: Chew   Quit date: 10/17/1993     Counseling given: Not Answered   Clinical Intake:  Pre-visit preparation completed: Yes  Pain : No/denies pain     Nutritional Status: BMI 25 -29 Overweight Nutritional Risks: None Diabetes: Yes CBG done?: No Did pt. bring in CBG monitor from home?: No  How often do you need to have someone help you when you read instructions, pamphlets, or other written materials from your doctor or pharmacy?: 1 - Never  Pre-diabetic   Diabetic Exams:  Diabetic Eye Exam: Completed 11/19/2018.   Diabetic Foot Exam: Completed  07/22/2019.   Interpreter Needed?: No  Information entered by :: Albie Bazin,LPN  Past Medical History:  Diagnosis Date   Arthritis    BPH (benign prostatic hypertrophy)    Coronary artery disease    Dyspnea    d/t heart problem.  worsens in hot or cold extremes   GERD (gastroesophageal reflux disease)    Hypertension    Myocardial infarction [facility])    April 2019. 1 stent placed   Past Surgical History:  Procedure Laterality Date   CATARACT EXTRACTION W/PHACO Right 04/12/2016   Procedure: CATARACT EXTRACTION PHACO AND INTRAOCULAR LENS PLACEMENT ([place]);  Surgeon: Birder Robson, MD;  Location: [facility] ORS;  Service: Ophthalmology;  Laterality: Right;  [place]' 01:05AP% 26.6CDE 17.23fluid pack lot # [id number]   CORONARY/GRAFT ACUTE MI REVASCULARIZATION N/A 01/16/2018   Procedure: Coronary/Graft Acute MI Revascularization;  Surgeon: Nelva Bush, MD;  Location: [place] [facility];  Service: Cardiovascular;  Laterality: N/A;   EYE SURGERY     KNEE SURGERY Right 09/21/2012   arthroscopy   LEFT HEART CATH AND CORONARY ANGIOGRAPHY N/A 01/16/2018   Procedure: LEFT HEART CATH AND CORONARY ANGIOGRAPHY;  Surgeon: Nelva Bush, MD;  Location: [place] [facility];  Service: Cardiovascular;  Laterality: N/A;   TOTAL KNEE ARTHROPLASTY Right 05/15/2019   Procedure: TOTAL KNEE ARTHROPLASTY;  Surgeon: Lovell Sheehan, MD;  Location: [facility] ORS;  Service: Orthopedics;  Laterality: Right;   Family History  Problem Relation Age  of Onset   Diabetes Mother    Stroke Mother    Prostate cancer Brother    Melanoma Brother    Hyperlipidemia Paternal Aunt    Social History   Socioeconomic History   Marital status: Married    Spouse name: Baldo Ash   Number of children: Not on file   Years of education: 12th grade   Highest education level: High school graduate  Occupational History   Occupation: retired  Scientist, 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    Worry: Never true    Inability: Never true   Transportation needs    Medical: No    Non-medical: No  Tobacco Use   Smoking status: Former Smoker    Types: Cigars    Quit date: 05/07/1974    Years since quitting: 45.3   Smokeless tobacco: Former Systems developer    Types: [place] date: 10/17/1993  Substance and Sexual Activity   Alcohol use: No   Drug use: No   Sexual activity: Not on file  Lifestyle   Physical activity    Days per week: 5 days    Minutes per session: 30 min   Stress: Patient refused  Relationships   Social connections    Talks on phone: More than three times a week    Gets together: More than three times a week    Attends religious service: More than 4 times per year    Active member of club or organization: No    Attends meetings of clubs or organizations: Never    Relationship status: Married  Other Topics Concern   Not on file  Social History Narrative   Not on file    Outpatient Encounter Medications as of 09/03/2019  Medication Sig   aspirin 81 MG tablet Take 1 tablet (81 mg total) by mouth daily.   atorvastatin (LIPITOR) 80 MG tablet Take 1 tablet (80 mg total) by mouth daily at 6 PM.   carvedilol (COREG) 6.25 MG tablet Take 1 tablet (6.25 mg total) by mouth 2 (two) times daily.   Famotidine (PEPCID AC PO) Take 1 tablet by mouth as needed.   fluticasone (FLONASE) 50 MCG/ACT nasal spray Place 2 sprays into both nostrils daily. Use for 4-6 weeks then stop and use seasonally or as needed.   losartan (COZAAR) 25 MG tablet Take 1 tablet (25 mg total) by mouth daily.   tamsulosin (FLOMAX) 0.4 MG CAPS capsule Take 1 capsule (0.4 mg total) by mouth daily.   ticagrelor (BRILINTA) 90 MG TABS tablet Take 1 tablet (90 mg total) by mouth 2 (two) times daily.   docusate sodium (COLACE) 100 MG capsule Take 1 capsule (100 mg total) by mouth 2 (two) times daily. (Patient not taking: Reported on 09/03/2019)   No facility-administered  encounter medications on file as of 09/03/2019.     Activities of Daily Living In your present state of health, do you have any difficulty performing the following activities: 09/03/2019 05/15/2019  Hearing? N -  Comment no hearing aids -  Vision? N -  Comment eyeglasses, goes to [facility] -  Difficulty concentrating or making decisions? N -  Walking or climbing stairs? N -  Comment - -  Dressing or bathing? N -  Doing errands, shopping? N N  Preparing Food and eating ? N -  Using the Toilet? N -  In the past six months, have you accidently leaked urine? N -  Do you have problems with loss of bowel control? N -  Managing your Medications? N -  Managing your Finances? N -  Housekeeping or managing your Housekeeping? N -  Some recent data might be hidden    Patient Care Team: Olin Hauser, DO as PCP - General (Family Medicine) Minna Merritts, MD as Consulting Physician (Cardiology)   Assessment:   This is a routine wellness examination for [place].  Exercise Activities and Dietary recommendations Current Exercise Habits: Home exercise routine, Type of exercise: stretching(bike), Time (Minutes): 15, Frequency (Times/Week): 7, Weekly Exercise (Minutes/Week): 105, Intensity: Mild, Exercise limited by: orthopedic condition(s)  Goals     Exercise 150 min/wk Moderate Activity     Pt purchased stationary bicycle after cardiac rehab. Continue using 3-5 times per week for 30 minutes.     Increase physical activity     Current activity is walking occasionally- limited due to knee.  Suggestions for walking- walking laps in a pool and suggested [employer].        Fall Risk Fall Risk  09/03/2019 07/22/2019 04/16/2019 01/09/2019 08/21/2018  Falls in the past year? 0 0 0 0 0  Number falls in past yr: 0 - - - -  Injury with Fall? 0 - - - -  Risk for fall due to : - - - - -  Follow up - Falls evaluation completed Falls evaluation completed - -   FALL RISK  PREVENTION PERTAINING TO THE HOME:  Any stairs in or around the home? No  If so, are there any without handrails? No   Home free of loose throw rugs in walkways, pet beds, electrical cords, etc? Yes  Adequate lighting in your home to reduce risk of falls? Yes   ASSISTIVE DEVICES UTILIZED TO PREVENT FALLS:  Life alert? No  Use of a cane, walker or w/c? No  Grab bars in the bathroom? Yes  Shower chair or bench in shower? No  Elevated toilet seat or a handicapped toilet? No    TIMED UP AND GO:  Unable to perform    Depression Screen PHQ 2/9 Scores 09/03/2019 07/22/2019 04/16/2019 01/09/2019  PHQ - 2 Score 0 0 0 0  PHQ- 9 Score - - - -    Cognitive Function MMSE - Mini Mental State Exam 05/19/2015  Orientation to time 5  Orientation to Place 5  Registration 3  Attention/ Calculation 5  Recall 3  Language- name 2 objects 2  Language- repeat 1  Language- follow 3 step command 3  Language- read & follow direction 1  Write a sentence 1  Copy design 1  Total score 30     6CIT Screen 08/14/2018 06/27/2017  What Year? 0 points 0 points  What month? 0 points 0 points  What time? 0 points 0 points  Count back from 20 0 points 0 points  Months in reverse 0 points 0 points  Repeat phrase 2 points 2 points  Total Score 2 2    Immunization History  Administered Date(s) Administered   Influenza, High Dose Seasonal PF 07/28/2015, 08/14/2018   Influenza,inj,quad, With Preservative 07/31/2017, 07/18/2019   Influenza-Unspecified 06/17/2014, 07/20/2017   Pneumococcal Conjugate-13 05/07/2014   Pneumococcal Polysaccharide-23 10/12/2011   Pneumococcal-Unspecified 09/17/2011   Td 05/18/2011   Tdap 05/17/2008    Qualifies for Shingles Vaccine? Yes  Zostavax completed n/a. Due for Shingrix. Education has been provided regarding the importance of this vaccine. Pt has been advised to call insurance company to determine  out of pocket expense. Advised may also receive vaccine at  local pharmacy or Health Dept. Verbalized acceptance and understanding.  Tdap: up to date  Flu Vaccine: up to date   Pneumococcal Vaccine: up to date   Screening Tests Health Maintenance  Topic Date Due   OPHTHALMOLOGY EXAM  11/20/2019   HEMOGLOBIN A1C  01/12/2020   FOOT EXAM  07/21/2020   TETANUS/TDAP  05/17/2021   COLONOSCOPY  06/29/2024   INFLUENZA VACCINE  Completed   Hepatitis C Screening  Completed   PNA vac Low Risk Adult  Completed   Cancer Screenings:  Colorectal Screening: Completed 06/29/2014. Repeat every 10 years  Lung Cancer Screening: (Low Dose CT Chest recommended if Age 59-80 years, 30 pack-year currently smoking OR have quit w/in 15years.) does not qualify.   Additional Screening:  Hepatitis C Screening: does qualify; Completed 2020  Dental Screening: Recommended annual dental exams for proper oral hygiene  Community Resource Referral:  CRR required this visit?  No        Plan:  I have personally reviewed and addressed the Medicare Annual Wellness questionnaire and have noted the following in the patients chart:  A. Medical and social history B. Use of alcohol, tobacco or illicit drugs  C. Current medications and supplements D. Functional ability and status E.  Nutritional status F.  Physical activity G. Advance directives H. List of other physicians I.  Hospitalizations, surgeries, and ER visits in previous 12 months J.  [place] such as hearing and vision if needed, cognitive and depression L. Referrals and appointments   In addition, I have reviewed and discussed with patient certain preventive protocols, quality metrics, and best practice recommendations. A written personalized care plan for preventive services as well as general preventive health recommendations were provided to patient.   Signed,   Bevelyn Ngo, LPN  [id number] Nurse Health Advisor  Nurse Notes: none

## 2019-09-03 NOTE — Patient Instructions (Signed)
Barry [name] , Thank you for taking time to come for your Medicare Wellness Visit. I appreciate your ongoing commitment to your health goals. Please review the following plan we discussed and let me know if I can assist you in the future.   Screening recommendations/referrals: Colonoscopy: no longer required Recommended yearly ophthalmology/optometry visit for glaucoma screening and checkup Recommended yearly dental visit for hygiene and checkup  Vaccinations: Influenza vaccine: up to date  Pneumococcal vaccine: up to date  Tdap vaccine: up to date  Shingles vaccine: shingrix eligible     Advanced directives: please pick up a copy of this information next time you are in the office   Conditions/risks identified: none  Next appointment: Follow up in one year for your annual wellness visit   Preventive Care 77 Years and Older, Male Preventive care refers to lifestyle choices and visits with your health care provider that can promote health and wellness. What does preventive care include?  A yearly physical exam. This is also called an annual well check.  Dental exams once or twice a year.  Routine eye exams. Ask your health care provider how often you should have your eyes checked.  Personal lifestyle choices, including:  Daily care of your teeth and gums.  Regular physical activity.  Eating a healthy diet.  Avoiding tobacco and drug use.  Limiting alcohol use.  Practicing safe sex.  Taking low doses of aspirin every day.  Taking vitamin and mineral supplements as recommended by your health care provider. What happens during an annual well check? The services and screenings done by your health care provider during your annual well check will depend on your age, overall health, lifestyle risk factors, and family history of disease. Counseling  Your health care provider may ask you questions about your:  Alcohol use.  Tobacco use.  Drug use.  Emotional well-being.   Home and relationship well-being.  Sexual activity.  Eating habits.  History of falls.  Memory and ability to understand (cognition).  Work and work Statistician. Screening  You may have the following tests or measurements:  Height, weight, and BMI.  Blood pressure.  Lipid and cholesterol levels. These may be checked every 5 years, or more frequently if you are over 56 years old.  Skin check.  Lung cancer screening. You may have this screening every year starting at age 44 if you have a 30-pack-year history of smoking and currently smoke or have quit within the past 15 years.  Fecal occult blood test (FOBT) of the stool. You may have this test every year starting at age 41.  Flexible sigmoidoscopy or colonoscopy. You may have a sigmoidoscopy every 5 years or a colonoscopy every 10 years starting at age 66.  Prostate cancer screening. Recommendations will vary depending on your family history and other risks.  Hepatitis C blood test.  Hepatitis B blood test.  Sexually transmitted disease (STD) testing.  Diabetes screening. This is done by checking your blood sugar (glucose) after you have not eaten for a while (fasting). You may have this done every 1-3 years.  Abdominal aortic aneurysm (AAA) screening. You may need this if you are a current or former smoker.  Osteoporosis. You may be screened starting at age 24 if you are at high risk. Talk with your health care provider about your test results, treatment options, and if necessary, the need for more tests. Vaccines  Your health care provider may recommend certain vaccines, such as:  Influenza vaccine. This is recommended  every year.  Tetanus, diphtheria, and acellular pertussis (Tdap, Td) vaccine. You may need a Td booster every 10 years.  Zoster vaccine. You may need this after age 27.  Pneumococcal 13-valent conjugate (PCV13) vaccine. One dose is recommended after age 24.  Pneumococcal polysaccharide (PPSV23)  vaccine. One dose is recommended after age 39. Talk to your health care provider about which screenings and vaccines you need and how often you need them. This information is not intended to replace advice given to you by your health care provider. Make sure you discuss any questions you have with your health care provider. Document Released: 10/30/2015 Document Revised: 06/22/2016 Document Reviewed: 08/04/2015 Elsevier Interactive Patient Education  2017 [place] Prevention in the Home Falls can cause injuries. They can happen to people of all ages. There are many things you can do to make your home safe and to help prevent falls. What can I do on the outside of my home?  Regularly fix the edges of walkways and driveways and fix any cracks.  Remove anything that might make you trip as you walk through a door, such as a raised step or threshold.  Trim any bushes or trees on the path to your home.  Use bright outdoor lighting.  Clear any walking paths of anything that might make someone trip, such as rocks or tools.  Regularly check to see if handrails are loose or broken. Make sure that both sides of any steps have handrails.  Any raised decks and porches should have guardrails on the edges.  Have any leaves, snow, or ice cleared regularly.  Use sand or salt on walking paths during winter.  Clean up any spills in your garage right away. This includes oil or grease spills. What can I do in the bathroom?  Use night lights.  Install grab bars by the toilet and in the tub and shower. Do not use towel bars as grab bars.  Use non-skid mats or decals in the tub or shower.  If you need to sit down in the shower, use a plastic, non-slip stool.  Keep the floor dry. Clean up any water that spills on the floor as soon as it happens.  Remove soap buildup in the tub or shower regularly.  Attach bath mats securely with double-sided non-slip rug tape.  Do not have throw rugs  and other things on the floor that can make you trip. What can I do in the bedroom?  Use night lights.  Make sure that you have a light by your bed that is easy to reach.  Do not use any sheets or blankets that are too big for your bed. They should not hang down onto the floor.  Have a firm chair that has side arms. You can use this for support while you get dressed.  Do not have throw rugs and other things on the floor that can make you trip. What can I do in the kitchen?  Clean up any spills right away.  Avoid walking on wet floors.  Keep items that you use a lot in easy-to-reach places.  If you need to reach something above you, use a strong step stool that has a grab bar.  Keep electrical cords out of the way.  Do not use floor polish or wax that makes floors slippery. If you must use wax, use non-skid floor wax.  Do not have throw rugs and other things on the floor that can make you trip. What  can I do with my stairs?  Do not leave any items on the stairs.  Make sure that there are handrails on both sides of the stairs and use them. Fix handrails that are broken or loose. Make sure that handrails are as long as the stairways.  Check any carpeting to make sure that it is firmly attached to the stairs. Fix any carpet that is loose or worn.  Avoid having throw rugs at the top or bottom of the stairs. If you do have throw rugs, attach them to the floor with carpet tape.  Make sure that you have a light switch at the top of the stairs and the bottom of the stairs. If you do not have them, ask someone to add them for you. What else can I do to help prevent falls?  Wear shoes that:  Do not have high heels.  Have rubber bottoms.  Are comfortable and fit you well.  Are closed at the toe. Do not wear sandals.  If you use a stepladder:  Make sure that it is fully opened. Do not climb a closed stepladder.  Make sure that both sides of the stepladder are locked into  place.  Ask someone to hold it for you, if possible.  Clearly mark and make sure that you can see:  Any grab bars or handrails.  First and last steps.  Where the edge of each step is.  Use tools that help you move around (mobility aids) if they are needed. These include:  Canes.  Walkers.  Scooters.  Crutches.  Turn on the lights when you go into a dark area. Replace any light bulbs as soon as they burn out.  Set up your furniture so you have a clear path. Avoid moving your furniture around.  If any of your floors are uneven, fix them.  If there are any pets around you, be aware of where they are.  Review your medicines with your doctor. Some medicines can make you feel dizzy. This can increase your chance of falling. Ask your doctor what other things that you can do to help prevent falls. This information is not intended to replace advice given to you by your health care provider. Make sure you discuss any questions you have with your health care provider. Document Released: 07/30/2009 Document Revised: 03/10/2016 Document Reviewed: 11/07/2014 Elsevier Interactive Patient Education  2017 [employer].

## 2019-09-16 ENCOUNTER — Telehealth: Payer: Self-pay | Admitting: Cardiovascular Disease

## 2019-09-16 NOTE — Telephone Encounter (Signed)
   [facility] Pre-operative Risk Assessment    Request for surgical clearance:  1. What type of surgery is being performed? ROUTINE DENTAL CLEANING  2. When is this surgery scheduled? 09/18/19  3. What type of clearance is required (medical clearance vs. Pharmacy clearance to hold med vs. Both)? NOT LISTED  4. Are there any medications that need to be held prior to surgery and how long? Not listed  Practice name and name of physician performing surgery? Lynn Ito DDS 5. What is your office phone number [phone]   7.   What is your office fax number [phone]  8.   Anesthesia type (None, local, MAC, general) ? Not listed  Also asks if pt needs premed and if so, Amoxicillin 2000 mg 1 hour before ok?   Lucienne Minks 09/16/2019, 4:06 PM  _________________________________________________________________   (provider comments below)

## 2019-09-16 NOTE — Telephone Encounter (Signed)
   Primary Cardiologist: Ida Rogue, MD  Chart reviewed as part of pre-operative protocol coverage. Simple dental extractions are considered low risk procedures per guidelines and generally do not require any specific cardiac clearance. It is also generally accepted that for simple extractions and dental cleanings, there is no need to interrupt blood thinner therapy.   From a cardiovascular standpoint he does not need any antibiotics prior to dental work as he does not have any artifical heart valves.  However some orthopedic surgeons like patients to take prophylactic antibiotics prior to dental work. He is s/p knee replacement. The orthopedic MD would need to address this.  I will route this recommendation to the requesting party via Epic fax function and remove from pre-op pool.  Please call with questions.  Daune Perch, NP 09/16/2019, 4:23 PM

## 2019-09-17 DIAGNOSIS — I77819 Aortic ectasia, unspecified site: Secondary | ICD-10-CM

## 2019-09-17 HISTORY — DX: Aortic ectasia, unspecified site: I77.819

## 2019-09-20 NOTE — Progress Notes (Addendum)
Cardiology Office Note  Date:  09/23/2019   ID:  Barry [name], Barry [name] [birth year], MRN [id number]  PCP:  Olin Hauser, DO   Chief Complaint  Patient presents with  . other    6 month follow up. Meds reviewed by the pt.'s bottles. Pt. c/o rapid heart beats off & on since July after his knee surgery. Would like to know if should have antibiotic prior to dental procedure.     HPI:  73 year old gentleman with history of  CAD, prior PCI to mid RCA 01/2018 hypertension,  hyperlipidemia,  arthritis,  Anxiety  Chronic right knee pain,  Diabetes type 2, HBA1C 6.4 Ejection fraction 45 to 50% April 2019 who presents for follow-up of his coronary disease,  STEMI, proximal to mid RCA April 2019  Had right knee surgery Completed PT Does bike 15 min a day, otherwise not much walking Reports he has some shortness of breath when walking July 29th surgery on the knee Had "fast heart rate " in hospital At home continues to have periodic fast heart rate unclear etiology, bothered by heart rate of 90.  Particularly symptomatic just feels it is moving too fast  Bothered by the swelling in his right knee post surgery  EKG personally reviewed by myself on todays visit Normal sinus rhythm rate 70 bpm rare PVC  Other past medical history reviewed Catheterization  showing 50% stenosis left circumflex and LAD  Paroxysmal atrial fibrillation noted prior to PCI, details unclear  Echocardiogram - Left ventricle: The cavity size was normal. Wall thickness was   increased in a pattern of mild LVH. Systolic function was mildly   reduced. The estimated ejection fraction was in the range of 45%   to 50%. There is hypokinesis of the basal-midinferolateral and   inferior myocardium. Left ventricular diastolic function   parameters were normal for the patient&'s age. - Aortic valve: There was trivial regurgitation. - Right ventricle: The cavity size was normal. Systolic function   was  mildly to moderately reduced.  January 16, 2018 chest pain, d/c on 01/18/2018 Cardiac catheterization performed inferior STEMI thrombotic occlusion to the proximal RCA moderate nonobstructive coronary disease of LAD and left circumflex Proximal to mid LAD is aneurysmal Drug-eluting stent placed to proximal to mid RCA 3.0 x 38 mm  Peak troponin 54 on January 17, 2018  Previous side effects to various medications Amlodipine, he developed swelling HCTZ, reported having dry skin, dry mouth Lisinopril, developed a cough Atenolol: did not work Coreg: ok so far Previous dose was 12.5 twice daily This was decreased down to 3.125 when he left the hospital  Family history of diabetes, MI Mom with CVA Dad with alzheimers  PMH:   has a past medical history of Arthritis, BPH (benign prostatic hypertrophy), Coronary artery disease, Dyspnea, GERD (gastroesophageal reflux disease), Hypertension, and Myocardial infarction ([place]).  PSH:    Past Surgical History:  Procedure Laterality Date  . CATARACT EXTRACTION W/PHACO Right 04/12/2016   Procedure: CATARACT EXTRACTION PHACO AND INTRAOCULAR LENS PLACEMENT (IOC);  Surgeon: Birder Robson, MD;  Location: [facility] ORS;  Service: Ophthalmology;  Laterality: Right;  [place]' 01:05AP% 26.6CDE 17.77fluid pack lot # [id number]  . CORONARY/GRAFT ACUTE MI REVASCULARIZATION N/A 01/16/2018   Procedure: Coronary/Graft Acute MI Revascularization;  Surgeon: Nelva Bush, MD;  Location: [place] [facility];  Service: Cardiovascular;  Laterality: N/A;  . EYE SURGERY    . KNEE SURGERY Right 09/21/2012   arthroscopy  . LEFT HEART CATH AND CORONARY ANGIOGRAPHY N/A 01/16/2018  Procedure: LEFT HEART CATH AND CORONARY ANGIOGRAPHY;  Surgeon: Nelva Bush, MD;  Location: [place] [facility];  Service: Cardiovascular;  Laterality: N/A;  . TOTAL KNEE ARTHROPLASTY Right 05/15/2019   Procedure: TOTAL KNEE ARTHROPLASTY;  Surgeon: Lovell Sheehan, MD;  Location: [facility] ORS;  Service:  Orthopedics;  Laterality: Right;    Current Outpatient Medications  Medication Sig Dispense Refill  . aspirin 81 MG tablet Take 1 tablet (81 mg total) by mouth daily. 30 tablet   . atorvastatin (LIPITOR) 80 MG tablet Take 1 tablet (80 mg total) by mouth daily at 6 PM. 90 tablet 3  . carvedilol (COREG) 6.25 MG tablet Take 1 tablet (6.25 mg total) by mouth 2 (two) times daily. 180 tablet 3  . Famotidine (PEPCID AC PO) Take 1 tablet by mouth as needed.    . fluticasone (FLONASE) 50 MCG/ACT nasal spray Place 2 sprays into both nostrils daily. Use for 4-6 weeks then stop and use seasonally or as needed. 16 g 0  . losartan (COZAAR) 25 MG tablet Take 1 tablet (25 mg total) by mouth daily. 90 tablet 3  . metroNIDAZOLE (METROGEL) 1 % gel Apply 1 application topically daily.    . tamsulosin (FLOMAX) 0.4 MG CAPS capsule Take 1 capsule (0.4 mg total) by mouth daily. 90 capsule 3  . ticagrelor (BRILINTA) 90 MG TABS tablet Take 1 tablet (90 mg total) by mouth 2 (two) times daily. 180 tablet 3   No current facility-administered medications for this visit.     Allergies:   Lisinopril   Social History:  The patient  reports that he quit smoking about 45 years ago. His smoking use included cigars. He quit smokeless tobacco use about 25 years ago.  His smokeless tobacco use included chew. He reports that he does not drink alcohol or use drugs.   Family History:   family history includes Diabetes in his mother; Hyperlipidemia in his paternal aunt; Melanoma in his brother; Prostate cancer in his brother; Stroke in his mother.    Review of Systems: Review of Systems  Constitutional: Negative.   HENT: Negative.   Respiratory: Positive for shortness of breath.   Cardiovascular: Positive for palpitations.  Gastrointestinal: Negative.   Musculoskeletal: Negative.   Neurological: Negative.   Psychiatric/Behavioral: Negative.   All other systems reviewed and are negative.   PHYSICAL EXAM: VS:  BP (!)  160/82 (BP Location: Left Arm, Patient Position: Sitting, Cuff Size: Normal)   Pulse 75   Temp (!) 97.2 F (36.2 C)   Ht 5\' 9"  (1.753 m)   Wt 180 lb 8 oz (81.9 kg)   SpO2 98%   BMI 26.66 kg/m  , BMI Body mass index is 26.66 kg/m.  Constitutional:  oriented to person, place, and time. No distress.  HENT:  Head: Grossly normal Eyes:  no discharge. No scleral icterus.  Neck: No JVD, no carotid bruits  Cardiovascular: Regular rate and rhythm, no murmurs appreciated Pulmonary/Chest: Clear to auscultation bilaterally, no wheezes or rails Abdominal: Soft.  no distension.  no tenderness.  Musculoskeletal: Normal range of motion Neurological:  normal muscle tone. Coordination normal. No atrophy Skin: Skin warm and dry Psychiatric: normal affect, pleasant   Recent Labs: 07/15/2019: ALT 9; BUN 14; Creat 0.81; Hemoglobin 12.9; Platelets 278; Potassium 4.4; Sodium 141    Lipid Panel Lab Results  Component Value Date   CHOL 114 07/15/2019   HDL 37 (L) 07/15/2019   LDLCALC 61 07/15/2019   TRIG 79 07/15/2019  Wt Readings from Last 3 Encounters:  09/23/19 180 lb 8 oz (81.9 kg)  09/03/19 177 lb (80.3 kg)  07/22/19 177 lb 8 oz (80.5 kg)       ASSESSMENT AND PLAN:  Coronary artery disease with stable angina Prior STEMI presented April 2019 with STEMI occluded proximal RCA nonobstructive LAD disease, circumflex disease -He is concerned about shortness of breath on exertion, echocardiogram has been ordered for new baseline ejection fraction  Essential (primary) hypertension - Concerned about cough, recommended he hold losartan for several weeks to see if cough gets better Given elevated heart rate at times up to the 90 range , we will increase carvedilol up to 12.5 twice daily For more tachyarrhythmia, might need a zio monitor to rule out atrial fibrillation  Pure hypercholesterolemia -  Cholesterol at goal, numbers discussed with him  Diabetes mellitus due to underlying  condition with hyperosmolarity without coma, without long-term current use of insulin ([place]) Recommended regular walking program for conditioning, weight loss   numerous issues to discuss today, elevated heart rate, shortness of breath, blood pressure, anginal symptoms  total encounter time more than 40 minutes  Greater than 50% was spent in counseling and coordination of care with the patient  Disposition:   F/U  6 months   No orders of the defined types were placed in this encounter.    Signed, Esmond Plants, M.D., Ph.D. 09/23/2019  [place], [place]

## 2019-09-23 ENCOUNTER — Other Ambulatory Visit: Payer: Self-pay

## 2019-09-23 ENCOUNTER — Ambulatory Visit: Payer: PPO | Admitting: Cardiovascular Disease

## 2019-09-23 ENCOUNTER — Encounter: Payer: Self-pay | Admitting: Cardiovascular Disease

## 2019-09-23 VITALS — BP 160/82 | HR 75 | Temp 97.2°F | Ht 69.0 in | Wt 180.5 lb

## 2019-09-23 DIAGNOSIS — I480 Paroxysmal atrial fibrillation: Secondary | ICD-10-CM | POA: Diagnosis not present

## 2019-09-23 DIAGNOSIS — E7800 Pure hypercholesterolemia, unspecified: Secondary | ICD-10-CM | POA: Diagnosis not present

## 2019-09-23 DIAGNOSIS — I25118 Atherosclerotic heart disease of native coronary artery with other forms of angina pectoris: Secondary | ICD-10-CM | POA: Diagnosis not present

## 2019-09-23 DIAGNOSIS — I10 Essential (primary) hypertension: Secondary | ICD-10-CM | POA: Diagnosis not present

## 2019-09-23 DIAGNOSIS — R0602 Shortness of breath: Secondary | ICD-10-CM | POA: Diagnosis not present

## 2019-09-23 DIAGNOSIS — I255 Ischemic cardiomyopathy: Secondary | ICD-10-CM | POA: Diagnosis not present

## 2019-09-23 DIAGNOSIS — E119 Type 2 diabetes mellitus without complications: Secondary | ICD-10-CM

## 2019-09-23 MED ORDER — LOSARTAN POTASSIUM 25 MG PO TABS: 25.0000 mg | ORAL_TABLET | Freq: Every day | ORAL | 3 refills | Status: DC

## 2019-09-23 MED ORDER — ATORVASTATIN CALCIUM 80 MG PO TABS: 80.0000 mg | ORAL_TABLET | Freq: Every day | ORAL | 3 refills | Status: DC

## 2019-09-23 MED ORDER — CARVEDILOL 12.5 MG PO TABS: 12.5000 mg | ORAL_TABLET | Freq: Two times a day (BID) | ORAL | 3 refills | Status: DC

## 2019-09-23 MED ORDER — TICAGRELOR 60 MG PO TABS: 60.0000 mg | ORAL_TABLET | Freq: Two times a day (BID) | ORAL | 3 refills | Status: DC

## 2019-09-23 NOTE — Addendum Note (Signed)
Addended by: Minna Merritts on: 09/23/2019 01:25 PM   Modules accepted: Level of Service

## 2019-09-23 NOTE — Patient Instructions (Addendum)
Medication Instructions:  Your physician has recommended you make the following change in your medication:   1. INCREASE Carvediolol (Coreg) up to 12.5 mg twice a day 2. DECREASE Brilinta down to 60 mg twice a day 3. For cough, you could do a trial hold on the losartan for a few weeks to see if cough gets better.   If you need a refill on your cardiac medications before your next appointment, please call your pharmacy.    Lab work: No new labs needed   If you have labs (blood work) drawn today and your tests are completely normal, you will receive your results only by: Marland Kitchen MyChart Message (if you have MyChart) OR . A paper copy in the mail If you have any lab test that is abnormal or we need to change your treatment, we will call you to review the results.   Testing/Procedures: We will order an echocardiogram for shortness of breath, known CAD  Your physician has requested that you have an echocardiogram. Echocardiography is a painless test that uses sound waves to create images of your heart. It provides your doctor with information about the size and shape of your heart and how well your heart's chambers and valves are working. This procedure takes approximately one hour. There are no restrictions for this procedure.     Follow-Up:  At [facility], you and your health needs are our priority.  As part of our continuing mission to provide you with exceptional heart care, we have created designated Provider Care Teams.  These Care Teams include your primary Cardiologist (physician) and Advanced Practice Providers (APPs -  Physician Assistants and Nurse Practitioners) who all work together to provide you with the care you need, when you need it.  . You will need a follow up appointment in 6 months   . Providers on your designated Care Team:   . Murray Hodgkins, NP . Christell Faith, PA-C . Marrianne Mood, PA-C  Any Other Special Instructions Will Be Listed Below (If  Applicable).  For educational health videos Log in to : [email or web address] Or : [email or web address], password : [email or web address]

## 2019-09-25 ENCOUNTER — Ambulatory Visit (INDEPENDENT_AMBULATORY_CARE_PROVIDER_SITE_OTHER): Payer: PPO

## 2019-09-25 ENCOUNTER — Other Ambulatory Visit: Payer: Self-pay

## 2019-09-25 DIAGNOSIS — R0602 Shortness of breath: Secondary | ICD-10-CM | POA: Diagnosis not present

## 2019-10-29 DIAGNOSIS — Z96651 Presence of right artificial knee joint: Secondary | ICD-10-CM | POA: Diagnosis not present

## 2019-11-21 DIAGNOSIS — H2511 Age-related nuclear cataract, right eye: Secondary | ICD-10-CM | POA: Diagnosis not present

## 2020-02-06 ENCOUNTER — Telehealth: Payer: Self-pay | Admitting: Family Medicine

## 2020-02-06 NOTE — Chronic Care Management (AMB) (Signed)
  Chronic Care Management   Note  02/06/2020 Name: Marque Rademaker MRN: [id number] DOB: 03-03-1946  Garland Hincapie is a 74 y.o. year old male who is a primary care patient of Olin Hauser, DO. I reached out to [place] by phone today in response to a referral sent by Mr. Trooper Olander [employer]'s health plan.     Mr. Dusza was given information about Chronic Care Management services today including:  1. CCM service includes personalized support from designated clinical staff supervised by his physician, including individualized plan of care and coordination with other care providers 2. 24/7 contact phone numbers for assistance for urgent and routine care needs. 3. Service will only be billed when office clinical staff spend 20 minutes or more in a month to coordinate care. 4. Only one practitioner may furnish and bill the service in a calendar month. 5. The patient may stop CCM services at any time (effective at the end of the month) by phone call to the office staff. 6. The patient will be responsible for cost sharing (co-pay) of up to 20% of the service fee (after annual deductible is met).  Patient did not agree to enrollment in care management services and does not wish to consider at this time. Patient has been offered CCM services twice.   Follow up plan: The patient has been provided with contact information for the care management team and has been advised to call with any health related questions or concerns. If patient returns call to provider office, please advise to call [facility] at [phone].  [place], [place] [address] Direct Dial: [phone] [email or web address]: [email or web address]

## 2020-03-23 ENCOUNTER — Ambulatory Visit: Payer: PPO | Admitting: Cardiovascular Disease

## 2020-04-03 DIAGNOSIS — R208 Other disturbances of skin sensation: Secondary | ICD-10-CM | POA: Diagnosis not present

## 2020-04-03 DIAGNOSIS — L538 Other specified erythematous conditions: Secondary | ICD-10-CM | POA: Diagnosis not present

## 2020-04-03 DIAGNOSIS — L728 Other follicular cysts of the skin and subcutaneous tissue: Secondary | ICD-10-CM | POA: Diagnosis not present

## 2020-04-03 DIAGNOSIS — L720 Epidermal cyst: Secondary | ICD-10-CM | POA: Diagnosis not present

## 2020-04-03 DIAGNOSIS — L718 Other rosacea: Secondary | ICD-10-CM | POA: Diagnosis not present

## 2020-04-28 DIAGNOSIS — Z96651 Presence of right artificial knee joint: Secondary | ICD-10-CM | POA: Diagnosis not present

## 2020-04-28 NOTE — Progress Notes (Signed)
Cardiology Office Note  Date:  04/29/2020   ID:  Rosendo, Couser June 24, 1946, MRN [id number]  PCP:  Olin Hauser, DO   Chief Complaint  Patient presents with  . office visit    Pt has a difficult time breathing. Meds verbally reviewed w/ pt.    HPI:  74 year old gentleman with history of  CAD, prior PCI to mid RCA 01/2018 hypertension,  hyperlipidemia,  arthritis,  Anxiety  Chronic right knee pain,  Diabetes type 2, HBA1C 6.4 Ejection fraction 45 to 50% April 2019 Intolerance to several blood pressure medications as detailed below who presents for follow-up of his coronary disease,  STEMI, proximal to mid RCA April 2019  In follow-up today he reports that he is active at home In terms of his activity, reports doing some biking at times on a recumbent bike in his house  Previously reported shortness of breath As work-up for that, echocardiogram December 2020, normal ejection fraction EF 55%  He is now retired Does mowing, works in the garden  Worried about a dry cough  Wife who presents with him today feels it is from allergies, exacerbated by mowing and being in the garden  He is not taking allergy medication Concerned about elevated blood pressure sometimes in the mornings, most of the time is 737 up to 106 systolic Rarely 269SW thing in the morning, goes down later in the day  EKG personally reviewed by myself on todays visit Normal sinus rhythm rate 71 bpm no significant ST-T wave changes  Other past medical history reviewed Paroxysmal atrial fibrillation noted prior to PCI, details unclear  Echocardiogram - Left ventricle: The cavity size was normal. Wall thickness was   increased in a pattern of mild LVH. Systolic function was mildly   reduced. The estimated ejection fraction was in the range of 45%   to 50%. There is hypokinesis of the basal-midinferolateral and   inferior myocardium. Left ventricular diastolic function   parameters were  normal for the patient&'s age. - Aortic valve: There was trivial regurgitation. - Right ventricle: The cavity size was normal. Systolic function   was mildly to moderately reduced.  January 16, 2018 chest pain, d/c on 01/18/2018 Cardiac catheterization performed inferior STEMI thrombotic occlusion to the proximal RCA moderate nonobstructive coronary disease of LAD and left circumflex Proximal to mid LAD is aneurysmal Drug-eluting stent placed to proximal to mid RCA 3.0 x 38 mm  Peak troponin 54 on January 17, 2018  Previous side effects to various medications Amlodipine, he developed swelling HCTZ, reported having dry skin, dry mouth Lisinopril, developed a cough Atenolol: did not work Coreg: ok so far   Family history of diabetes, MI Mom with CVA Dad with alzheimers  PMH:   has a past medical history of Arthritis, BPH (benign prostatic hypertrophy), Coronary artery disease, Dyspnea, GERD (gastroesophageal reflux disease), Hypertension, and Myocardial infarction ([place]).  PSH:    Past Surgical History:  Procedure Laterality Date  . CATARACT EXTRACTION W/PHACO Right 04/12/2016   Procedure: CATARACT EXTRACTION PHACO AND INTRAOCULAR LENS PLACEMENT (IOC);  Surgeon: Birder Robson, MD;  Location: [facility] ORS;  Service: Ophthalmology;  Laterality: Right;  [place]' 01:05AP% 26.6CDE 17.30fluid pack lot # [id number]  . CORONARY/GRAFT ACUTE MI REVASCULARIZATION N/A 01/16/2018   Procedure: Coronary/Graft Acute MI Revascularization;  Surgeon: Nelva Bush, MD;  Location: [place] [facility];  Service: Cardiovascular;  Laterality: N/A;  . EYE SURGERY    . KNEE SURGERY Right 09/21/2012   arthroscopy  . LEFT HEART  CATH AND CORONARY ANGIOGRAPHY N/A 01/16/2018   Procedure: LEFT HEART CATH AND CORONARY ANGIOGRAPHY;  Surgeon: Nelva Bush, MD;  Location: [place] [facility];  Service: Cardiovascular;  Laterality: N/A;  . TOTAL KNEE ARTHROPLASTY Right 05/15/2019   Procedure: TOTAL KNEE ARTHROPLASTY;  Surgeon:  Lovell Sheehan, MD;  Location: [facility];  Service: Orthopedics;  Laterality: Right;    Current Outpatient Medications  Medication Sig Dispense Refill  . aspirin 81 MG tablet Take 1 tablet (81 mg total) by mouth daily. 30 tablet   . atorvastatin (LIPITOR) 80 MG tablet Take 1 tablet (80 mg total) by mouth daily at 6 PM. 90 tablet 3  . carvedilol (COREG) 12.5 MG tablet Take 1 tablet (12.5 mg total) by mouth 2 (two) times daily. 180 tablet 3  . Famotidine (PEPCID AC PO) Take 1 tablet by mouth as needed.    . fluticasone (FLONASE) 50 MCG/ACT nasal spray Place 2 sprays into both nostrils daily. Use for 4-6 weeks then stop and use seasonally or as needed. 16 g 0  . losartan (COZAAR) 25 MG tablet Take 1 tablet (25 mg total) by mouth daily. 90 tablet 3  . tamsulosin (FLOMAX) 0.4 MG CAPS capsule Take 1 capsule (0.4 mg total) by mouth daily. 90 capsule 3  . ticagrelor (BRILINTA) 60 MG TABS tablet Take 1 tablet (60 mg total) by mouth 2 (two) times daily. 180 tablet 3   No current facility-administered medications for this visit.    Allergies:   Lisinopril   Social History:  The patient  reports that he quit smoking about 46 years ago. His smoking use included cigars. He quit smokeless tobacco use about 26 years ago.  His smokeless tobacco use included chew. He reports that he does not drink alcohol and does not use drugs.   Family History:   family history includes Diabetes in his mother; Hyperlipidemia in his paternal aunt; Melanoma in his brother; Prostate cancer in his brother; Stroke in his mother.    Review of Systems: Review of Systems  Constitutional: Negative.   HENT: Negative.   Respiratory: Positive for shortness of breath.   Cardiovascular: Positive for palpitations.  Gastrointestinal: Negative.   Musculoskeletal: Negative.   Neurological: Negative.   Psychiatric/Behavioral: Negative.   All other systems reviewed and are negative.   PHYSICAL EXAM: VS:  BP 126/78 (BP Location:  Left Arm, Patient Position: Sitting, Cuff Size: Normal)   Pulse 71   Ht 5\' 9"  (1.753 m)   Wt 184 lb 4 oz (83.6 kg)   SpO2 97%   BMI 27.21 kg/m  , BMI Body mass index is 27.21 kg/m.  Constitutional:  oriented to person, place, and time. No distress.  HENT:  Head: Normocephalic and atraumatic.  Eyes:  no discharge. No scleral icterus.  Neck: Normal range of motion. Neck supple. No JVD present.  Cardiovascular: Normal rate, regular rhythm, normal heart sounds and intact distal pulses. Exam reveals no gallop and no friction rub. No edema No murmur heard. Pulmonary/Chest: Effort normal and breath sounds normal. No stridor. No respiratory distress.  no wheezes.  no rales.  no tenderness.  Abdominal: Soft.  no distension.  no tenderness.  Musculoskeletal: Normal range of motion.  no  tenderness or deformity.  Neurological:  normal muscle tone. Coordination normal. No atrophy Skin: Skin is warm and dry. No rash noted. not diaphoretic.  Psychiatric:  normal mood and affect. behavior is normal. Thought content normal.    Recent Labs: 07/15/2019: ALT 9; BUN 14;  Creat 0.81; Hemoglobin 12.9; Platelets 278; Potassium 4.4; Sodium 141    Lipid Panel Lab Results  Component Value Date   CHOL 114 07/15/2019   HDL 37 (L) 07/15/2019   LDLCALC 61 07/15/2019   TRIG 79 07/15/2019      Wt Readings from Last 3 Encounters:  04/29/20 184 lb 4 oz (83.6 kg)  09/23/19 180 lb 8 oz (81.9 kg)  09/03/19 177 lb (80.3 kg)       ASSESSMENT AND PLAN:  Coronary artery disease with stable angina Prior STEMI presented April 2019 with STEMI occluded proximal RCA nonobstructive LAD disease, circumflex disease Echocardiogram normal ejection fraction December 2020 Recommended regular exercise program for conditioning Discussed changing his Brilinta to Plavix in the next several months when he goes into the donut hole We did discuss aspirin alone, wife prefers that he stay on either Brilinta or  Plavix  Essential (primary) hypertension - Long discussion concerning his blood pressure, in general well controlled 120 up to 130 Reassured him on rare pressure 140 in the morning He takes losartan and Flomax at noon If pressures do creep higher could increase the losartan up to 50 mg daily  Pure hypercholesterolemia -  Cholesterol is at goal on the current lipid regimen. No changes to the medications were made.  Diabetes mellitus due to underlying condition with hyperosmolarity without coma, without long-term current use of insulin (HCC) Recommend regular walking program Diet restriction   Total encounter time more than 25 minutes  Greater than 50% was spent in counseling and coordination of care with the patient    No orders of the defined types were placed in this encounter.    Signed, Esmond Plants, M.D., Ph.D. 04/29/2020  [place], [place]

## 2020-04-29 ENCOUNTER — Other Ambulatory Visit: Payer: Self-pay

## 2020-04-29 ENCOUNTER — Encounter: Payer: Self-pay | Admitting: Cardiovascular Disease

## 2020-04-29 ENCOUNTER — Ambulatory Visit: Payer: PPO | Admitting: Cardiovascular Disease

## 2020-04-29 VITALS — BP 126/78 | HR 71 | Ht 69.0 in | Wt 184.2 lb

## 2020-04-29 DIAGNOSIS — I480 Paroxysmal atrial fibrillation: Secondary | ICD-10-CM | POA: Diagnosis not present

## 2020-04-29 DIAGNOSIS — E119 Type 2 diabetes mellitus without complications: Secondary | ICD-10-CM | POA: Diagnosis not present

## 2020-04-29 DIAGNOSIS — I255 Ischemic cardiomyopathy: Secondary | ICD-10-CM | POA: Diagnosis not present

## 2020-04-29 DIAGNOSIS — I25118 Atherosclerotic heart disease of native coronary artery with other forms of angina pectoris: Secondary | ICD-10-CM | POA: Diagnosis not present

## 2020-04-29 DIAGNOSIS — I10 Essential (primary) hypertension: Secondary | ICD-10-CM | POA: Diagnosis not present

## 2020-04-29 DIAGNOSIS — E7800 Pure hypercholesterolemia, unspecified: Secondary | ICD-10-CM | POA: Diagnosis not present

## 2020-04-29 NOTE — Patient Instructions (Addendum)
Medication Instructions:  No changes  Try cetirazine/zyrtec  1/2 up to whole pill  When you go into the donut, we will change to plavix 75 mg once a day and hold the brilinta  If you need a refill on your cardiac medications before your next appointment, please call your pharmacy.    Lab work: No new labs needed   If you have labs (blood work) drawn today and your tests are completely normal, you will receive your results only by: Marland Kitchen MyChart Message (if you have MyChart) OR . A paper copy in the mail If you have any lab test that is abnormal or we need to change your treatment, we will call you to review the results.   Testing/Procedures: No new testing needed   Follow-Up: At [facility], you and your health needs are our priority.  As part of our continuing mission to provide you with exceptional heart care, we have created designated Provider Care Teams.  These Care Teams include your primary Cardiologist (physician) and Advanced Practice Providers (APPs -  Physician Assistants and Nurse Practitioners) who all work together to provide you with the care you need, when you need it.  . You will need a follow up appointment in December 2021  . Providers on your designated Care Team:   . Murray Hodgkins, NP . Christell Faith, PA-C . Marrianne Mood, PA-C  Any Other Special Instructions Will Be Listed Below (If Applicable).  COVID-19 Vaccine Information can be found at: ShippingScam.co.uk For questions related to vaccine distribution or appointments, please email vaccine@[email or web address] or call [phone].

## 2020-05-01 DIAGNOSIS — H903 Sensorineural hearing loss, bilateral: Secondary | ICD-10-CM | POA: Diagnosis not present

## 2020-05-01 DIAGNOSIS — H6123 Impacted cerumen, bilateral: Secondary | ICD-10-CM | POA: Diagnosis not present

## 2020-05-12 ENCOUNTER — Encounter: Payer: Self-pay | Admitting: Family Medicine

## 2020-05-12 ENCOUNTER — Other Ambulatory Visit: Payer: Self-pay

## 2020-06-26 ENCOUNTER — Other Ambulatory Visit: Payer: Self-pay | Admitting: Family Medicine

## 2020-06-26 DIAGNOSIS — N401 Enlarged prostate with lower urinary tract symptoms: Secondary | ICD-10-CM

## 2020-06-26 NOTE — Telephone Encounter (Signed)
Requested medication (s) are due for refill today: Yes  Requested medication (s) are on the active medication list: Yes  Last refill:  01/09/19  Future visit scheduled: No  Notes to clinic:  Prescription has expired.    Requested Prescriptions  Pending Prescriptions Disp Refills   tamsulosin (FLOMAX) 0.4 MG CAPS capsule [Pharmacy Med Name: TAMSULOSIN HCL 0.4 MG CAPSULE] 90 capsule 0    Sig: Take 1 capsule (0.4 mg total) by mouth daily.      Urology: Alpha-Adrenergic Blocker Passed - 06/26/2020  9:10 AM      Passed - Last BP in normal range    BP Readings from Last 1 Encounters:  04/29/20 126/78          Passed - Valid encounter within last 12 months    Recent Outpatient Visits           11 months ago Annual physical exam   [place], DO   1 year ago Primary osteoarthritis of right knee   [facility] [place], Devonne Doughty, DO   1 year ago Type 2 diabetes mellitus with other specified complication, without long-term current use of insulin [facility])   [place], DO   1 year ago Bilateral impacted cerumen   [place], DO   1 year ago Acute non-recurrent frontal sinusitis   [place], DO       Future Appointments             In 3 months Goll[place], Kathlene November, MD [facility], [place]

## 2020-06-29 ENCOUNTER — Other Ambulatory Visit: Payer: Self-pay

## 2020-06-29 ENCOUNTER — Encounter: Payer: Self-pay | Admitting: Family Medicine

## 2020-06-29 ENCOUNTER — Ambulatory Visit (INDEPENDENT_AMBULATORY_CARE_PROVIDER_SITE_OTHER): Payer: PPO | Admitting: Family Medicine

## 2020-06-29 VITALS — BP 145/72 | HR 69 | Temp 97.5°F | Resp 16 | Ht 69.0 in | Wt 184.6 lb

## 2020-06-29 DIAGNOSIS — J019 Acute sinusitis, unspecified: Secondary | ICD-10-CM

## 2020-06-29 MED ORDER — FLUTICASONE PROPIONATE 50 MCG/ACT NA SUSP: 2.0000 | Freq: Every day | NASAL | 0 refills | Status: DC

## 2020-06-29 MED ORDER — AMOXICILLIN-POT CLAVULANATE 875-125 MG PO TABS: 1.0000 | ORAL_TABLET | Freq: Two times a day (BID) | ORAL | 0 refills | Status: DC

## 2020-06-29 NOTE — Patient Instructions (Addendum)
Thank you for coming to the office today.  Due for Diabetic Eye exam in October 2021  [facility] [address], [place], [place] [address] Phone: [phone] [email or web address]  Start antibiotic for sinuses Start nasal steroid Flonase 2 sprays in each nostril daily for 4-6 weeks, may repeat course seasonally or as needed   DUE for FASTING BLOOD WORK (no food or drink after midnight before the lab appointment, only water or coffee without cream/sugar on the morning of)  SCHEDULE "Lab Only" visit in the morning at the clinic for lab draw in 4-8 weeks  - Make sure Lab Only appointment is at about 1 week before your next appointment, so that results will be available  For Lab Results, once available within 2-3 days of blood draw, you can can log in to MyChart online to view your results and a brief explanation. Also, we can discuss results at next follow-up visit.   Please schedule a Follow-up Appointment to: Return in about 4 weeks (around 07/27/2020) for Fasting lab in 4-8 weeks then 1 week later Annual Physical.  If you have any other questions or concerns, please feel free to call the office or send a message through [place]. You may also schedule an earlier appointment if necessary.  Additionally, you may be receiving a survey about your experience at our office within a few days to 1 week by e-mail or mail. We value your feedback.  Nobie Putnam, DO [place]

## 2020-06-29 NOTE — Progress Notes (Signed)
Subjective:    Patient ID: Barry [name], male    DOB: [birth year], 74 y.o.   MRN: [id number]  Barry [name] is a 74 y.o. male presenting on 06/29/2020 for Hypertension   HPI  Subacute Rhinosinusitis History of chronic cough Since MI has had some chronic cough Was on Lisinporil ACEi cough, then switched to Losartan, improved Now for past 6 weeks has had some green sputum productive cough, new problem Previously tried Flonase, none recently Denies fever chills dyspnea, chest pain, headache   Health Maintenance: UTD COVID19 vaccine Pfizer 3/5, and 2/12  Depression screen [facility] 2/9 06/29/2020 09/03/2019 07/22/2019  Decreased Interest 0 0 0  Down, Depressed, Hopeless 0 0 0  PHQ - 2 Score 0 0 0  Altered sleeping - - -  Tired, decreased energy - - -  Change in appetite - - -  Feeling bad or failure about yourself  - - -  Trouble concentrating - - -  Moving slowly or fidgety/restless - - -  Suicidal thoughts - - -  PHQ-9 Score - - -  Difficult doing work/chores - - -    Social History   Tobacco Use  . Smoking status: Former Smoker    Types: Cigars    Quit date: 05/07/1974    Years since quitting: 46.1  . Smokeless tobacco: Former Systems developer    Types: [place] date: 10/17/1993  Vaping Use  . Vaping Use: Never used  Substance Use Topics  . Alcohol use: No  . Drug use: No    Review of Systems Per HPI unless specifically indicated above     Objective:    BP (!) 145/72   Pulse 69   Temp (!) 97.5 F (36.4 C) (Temporal)   Resp 16   Ht 5\' 9"  (1.753 m)   Wt 184 lb 9.6 oz (83.7 kg)   SpO2 98%   BMI 27.26 kg/m   Wt Readings from Last 3 Encounters:  06/29/20 184 lb 9.6 oz (83.7 kg)  04/29/20 184 lb 4 oz (83.6 kg)  09/23/19 180 lb 8 oz (81.9 kg)    Physical Exam Vitals and nursing note reviewed.  Constitutional:      General: He is not in acute distress.    Appearance: He is well-developed. He is not diaphoretic.     Comments: Well-appearing,  comfortable, cooperative  HENT:     Head: Normocephalic and atraumatic.     Right Ear: Tympanic membrane, ear canal and external ear normal. There is no impacted cerumen.     Left Ear: Tympanic membrane, ear canal and external ear normal. There is no impacted cerumen.     Mouth/Throat:     Mouth: Mucous membranes are moist.     Pharynx: No oropharyngeal exudate or posterior oropharyngeal erythema.  Eyes:     General:        Right eye: No discharge.        Left eye: No discharge.     Conjunctiva/sclera: Conjunctivae normal.  Neck:     Thyroid: No thyromegaly.  Cardiovascular:     Rate and Rhythm: Normal rate and regular rhythm.     Heart sounds: Normal heart sounds. No murmur heard.   Pulmonary:     Effort: Pulmonary effort is normal. No respiratory distress.     Breath sounds: Normal breath sounds. No wheezing or rales.  Musculoskeletal:        General: Normal range of motion.     Cervical back:  Normal range of motion and neck supple.  Lymphadenopathy:     Cervical: No cervical adenopathy.  Skin:    General: Skin is warm and dry.     Findings: No erythema or rash.  Neurological:     Mental Status: He is alert and oriented to person, place, and time.  Psychiatric:        Behavior: Behavior normal.     Comments: Well groomed, good eye contact, normal speech and thoughts    Results for orders placed or performed in visit on 07/23/19  HM DIABETES EYE EXAM  Result Value Ref Range   HM Diabetic Eye Exam No Retinopathy No Retinopathy      Assessment & Plan:   Problem List Items Addressed This Visit    None    Visit Diagnoses    Subacute rhinosinusitis    -  Primary   Relevant Medications   amoxicillin-clavulanate (AUGMENTIN) 875-125 MG tablet   fluticasone (FLONASE) 50 MCG/ACT nasal spray      Consistent with subacute rhinosinusitis, likely initially viral URI vs allergic rhinitis component with worsening concern for bacterial infection. Duration >6 weeks now Not ACEi  cough, off acei for while now on ARB No acute respiratory symptoms No sign of complication  Plan: 1 Start Augmentin 875-125mg  PO BID x 10 days 2. Start nasal steroid Flonase 2 sprays in each nostril daily for 4-6 weeks, may repeat course seasonally or as needed 3. Supportive care with nasal saline OTC, hydration 4. Return criteria reviewed   Meds ordered this encounter  Medications  . amoxicillin-clavulanate (AUGMENTIN) 875-125 MG tablet    Sig: Take 1 tablet by mouth 2 (two) times daily. 10 day    Dispense:  20 tablet    Refill:  0  . fluticasone (FLONASE) 50 MCG/ACT nasal spray    Sig: Place 2 sprays into both nostrils daily. Use for 4-6 weeks then stop and use seasonally or as needed.    Dispense:  16 g    Refill:  0      Follow up plan: Return in about 4 weeks (around 07/27/2020) for Fasting lab in 4-8 weeks then 1 week later Annual Physical.  Future labs ordered for 08/04/20   Nobie Putnam, [facility] 06/29/2020, 4:19 PM

## 2020-06-30 ENCOUNTER — Telehealth: Payer: Self-pay | Admitting: Family Medicine

## 2020-06-30 ENCOUNTER — Other Ambulatory Visit: Payer: Self-pay | Admitting: Family Medicine

## 2020-06-30 DIAGNOSIS — I10 Essential (primary) hypertension: Secondary | ICD-10-CM

## 2020-06-30 DIAGNOSIS — E785 Hyperlipidemia, unspecified: Secondary | ICD-10-CM

## 2020-06-30 DIAGNOSIS — R350 Frequency of micturition: Secondary | ICD-10-CM

## 2020-06-30 DIAGNOSIS — E1169 Type 2 diabetes mellitus with other specified complication: Secondary | ICD-10-CM

## 2020-06-30 DIAGNOSIS — Z0000 Encounter for general adult medical examination without abnormal findings: Secondary | ICD-10-CM

## 2020-06-30 DIAGNOSIS — I480 Paroxysmal atrial fibrillation: Secondary | ICD-10-CM

## 2020-06-30 NOTE — Telephone Encounter (Signed)
Received fax for refill already send.

## 2020-07-29 DIAGNOSIS — D2262 Melanocytic nevi of left upper limb, including shoulder: Secondary | ICD-10-CM | POA: Diagnosis not present

## 2020-07-29 DIAGNOSIS — D225 Melanocytic nevi of trunk: Secondary | ICD-10-CM | POA: Diagnosis not present

## 2020-07-29 DIAGNOSIS — L821 Other seborrheic keratosis: Secondary | ICD-10-CM | POA: Diagnosis not present

## 2020-07-29 DIAGNOSIS — S90561A Insect bite (nonvenomous), right ankle, initial encounter: Secondary | ICD-10-CM | POA: Diagnosis not present

## 2020-07-29 DIAGNOSIS — D2261 Melanocytic nevi of right upper limb, including shoulder: Secondary | ICD-10-CM | POA: Diagnosis not present

## 2020-07-29 DIAGNOSIS — L718 Other rosacea: Secondary | ICD-10-CM | POA: Diagnosis not present

## 2020-07-29 DIAGNOSIS — D2272 Melanocytic nevi of left lower limb, including hip: Secondary | ICD-10-CM | POA: Diagnosis not present

## 2020-07-29 DIAGNOSIS — D2271 Melanocytic nevi of right lower limb, including hip: Secondary | ICD-10-CM | POA: Diagnosis not present

## 2020-08-03 ENCOUNTER — Other Ambulatory Visit: Payer: Self-pay | Admitting: *Deleted

## 2020-08-03 DIAGNOSIS — I10 Essential (primary) hypertension: Secondary | ICD-10-CM

## 2020-08-03 DIAGNOSIS — N401 Enlarged prostate with lower urinary tract symptoms: Secondary | ICD-10-CM

## 2020-08-03 DIAGNOSIS — Z0000 Encounter for general adult medical examination without abnormal findings: Secondary | ICD-10-CM

## 2020-08-03 DIAGNOSIS — E1169 Type 2 diabetes mellitus with other specified complication: Secondary | ICD-10-CM

## 2020-08-03 DIAGNOSIS — I480 Paroxysmal atrial fibrillation: Secondary | ICD-10-CM

## 2020-08-03 DIAGNOSIS — R350 Frequency of micturition: Secondary | ICD-10-CM

## 2020-08-04 ENCOUNTER — Other Ambulatory Visit: Payer: PPO

## 2020-08-04 ENCOUNTER — Other Ambulatory Visit: Payer: Self-pay

## 2020-08-04 DIAGNOSIS — N401 Enlarged prostate with lower urinary tract symptoms: Secondary | ICD-10-CM | POA: Diagnosis not present

## 2020-08-04 DIAGNOSIS — R350 Frequency of micturition: Secondary | ICD-10-CM | POA: Diagnosis not present

## 2020-08-04 DIAGNOSIS — Z0000 Encounter for general adult medical examination without abnormal findings: Secondary | ICD-10-CM | POA: Diagnosis not present

## 2020-08-04 DIAGNOSIS — I10 Essential (primary) hypertension: Secondary | ICD-10-CM | POA: Diagnosis not present

## 2020-08-04 DIAGNOSIS — E785 Hyperlipidemia, unspecified: Secondary | ICD-10-CM | POA: Diagnosis not present

## 2020-08-04 DIAGNOSIS — E1169 Type 2 diabetes mellitus with other specified complication: Secondary | ICD-10-CM | POA: Diagnosis not present

## 2020-08-04 DIAGNOSIS — I480 Paroxysmal atrial fibrillation: Secondary | ICD-10-CM | POA: Diagnosis not present

## 2020-08-05 LAB — COMPLETE METABOLIC PANEL WITH GFR
AG Ratio: 2.1 (calc) (ref 1.0–2.5)
ALT: 16 U/L (ref 9–46)
AST: 18 U/L (ref 10–35)
Albumin: 4.5 g/dL (ref 3.6–5.1)
Alkaline phosphatase (APISO): 101 U/L (ref 35–144)
BUN: 14 mg/dL (ref 7–25)
CO2: 27 mmol/L (ref 20–32)
Calcium: 9 mg/dL (ref 8.6–10.3)
Chloride: 106 mmol/L (ref 98–110)
Creat: 0.86 mg/dL (ref 0.70–1.18)
GFR, Est African American: 99 mL/min/{1.73_m2} (ref >=60)
GFR, Est Non African American: 85 mL/min/{1.73_m2} (ref >=60)
Globulin: 2.1 g/dL (calc) (ref 1.9–3.7)
Glucose, Bld: 122 mg/dL — ABNORMAL HIGH (ref 65–99)
Potassium: 4.2 mmol/L (ref 3.5–5.3)
Sodium: 141 mmol/L (ref 135–146)
Total Bilirubin: 0.9 mg/dL (ref 0.2–1.2)
Total Protein: 6.6 g/dL (ref 6.1–8.1)

## 2020-08-05 LAB — HEMOGLOBIN A1C
Hgb A1c MFr Bld: 6.7 % of total Hgb — ABNORMAL HIGH (ref <5.7)
Mean Plasma Glucose: 146 (calc)
eAG (mmol/L): 8.1 (calc)

## 2020-08-05 LAB — CBC WITH DIFFERENTIAL/PLATELET
Absolute Monocytes: 618 cells/uL (ref 200–950)
Basophils Absolute: 59 cells/uL (ref 0–200)
Basophils Relative: 0.9 %
Eosinophils Absolute: 221 cells/uL (ref 15–500)
Eosinophils Relative: 3.4 %
HCT: 41.3 % (ref 38.5–50.0)
Hemoglobin: 14 g/dL (ref 13.2–17.1)
Lymphs Abs: 1645 cells/uL (ref 850–3900)
MCH: 30.8 pg (ref 27.0–33.0)
MCHC: 33.9 g/dL (ref 32.0–36.0)
MCV: 91 fL (ref 80.0–100.0)
MPV: 9.7 fL (ref 7.5–12.5)
Monocytes Relative: 9.5 %
Neutro Abs: 3959 cells/uL (ref 1500–7800)
Neutrophils Relative %: 60.9 %
Platelets: 238 10*3/uL (ref 140–400)
RBC: 4.54 10*6/uL (ref 4.20–5.80)
RDW: 12.1 % (ref 11.0–15.0)
Total Lymphocyte: 25.3 %
WBC: 6.5 10*3/uL (ref 3.8–10.8)

## 2020-08-05 LAB — LIPID PANEL
Cholesterol: 114 mg/dL (ref <200)
HDL: 37 mg/dL — ABNORMAL LOW (ref >=40)
LDL Cholesterol (Calc): 61 mg/dL (calc)
Non-HDL Cholesterol (Calc): 77 mg/dL (calc) (ref <130)
Total CHOL/HDL Ratio: 3.1 (calc) (ref <5.0)
Triglycerides: 81 mg/dL (ref <150)

## 2020-08-05 LAB — PSA: PSA: 0.62 ng/mL (ref <=4.0)

## 2020-08-05 LAB — TSH: TSH: 1.78 mIU/L (ref 0.40–4.50)

## 2020-08-11 ENCOUNTER — Other Ambulatory Visit: Payer: Self-pay

## 2020-08-11 ENCOUNTER — Encounter: Payer: Self-pay | Admitting: Family Medicine

## 2020-08-11 ENCOUNTER — Ambulatory Visit (INDEPENDENT_AMBULATORY_CARE_PROVIDER_SITE_OTHER): Payer: PPO | Admitting: Family Medicine

## 2020-08-11 VITALS — BP 117/59 | HR 72 | Temp 97.7°F | Resp 16 | Ht 69.0 in | Wt 183.0 lb

## 2020-08-11 DIAGNOSIS — I480 Paroxysmal atrial fibrillation: Secondary | ICD-10-CM | POA: Diagnosis not present

## 2020-08-11 DIAGNOSIS — E785 Hyperlipidemia, unspecified: Secondary | ICD-10-CM

## 2020-08-11 DIAGNOSIS — R350 Frequency of micturition: Secondary | ICD-10-CM

## 2020-08-11 DIAGNOSIS — I10 Essential (primary) hypertension: Secondary | ICD-10-CM | POA: Diagnosis not present

## 2020-08-11 DIAGNOSIS — E1169 Type 2 diabetes mellitus with other specified complication: Secondary | ICD-10-CM

## 2020-08-11 DIAGNOSIS — Z0000 Encounter for general adult medical examination without abnormal findings: Secondary | ICD-10-CM | POA: Diagnosis not present

## 2020-08-11 DIAGNOSIS — N401 Enlarged prostate with lower urinary tract symptoms: Secondary | ICD-10-CM

## 2020-08-11 NOTE — Progress Notes (Signed)
Subjective:    Patient ID: Barry [name], male    DOB: 08-05-1946, 74 y.o.   MRN: [id number]  Barry [name] is a 74 y.o. male presenting on 08/11/2020 for Annual Exam   HPI   Here for Annual Physical and lab review.  CHRONIC DM, Type 2: Previously A1c stable at 6.4 (previous 6.5) last result now at 6.7 Not checking CBG at home Meds:None - diet controlled Currentlyon Losartan Lifestyle: - Weight down 1 lb in 3 months - Diet (improved DM diet) - Exercise (active - Followed by [facility] visit 11/19/18 - but was not diabetic eye exam as they were not notified patient was diagnosed with diabetes. Denies hypoglycemia  CHRONIC HTN: Reportsno new concerns. Home BP readings normal controlled, similar to before Current Meds -Carvedilol12.5mg  BID, Losartan 25mg  daily Reports good compliance, took meds today. Tolerating well, w/o complaints.  CAD, history of STEMI / PAF / HLD Followed by Cardiology, next visit Dr Rockey Situ 09/2020 On Aspirin 81, Brilinta, Carvedilol, Atorvastatin Denies any chest pain, dyspnea, anginal symptoms, palpitations  S/p R knee TKR History of Right Knee Pain, Arthritis S/p surgery 04/2019  BPH with LUTS/ Prostate Cancer Screening Last PSA 0.64 07/2020 - He is doing well on Flomax 0.4mg  daily, he does admit some occasional postural dizziness Admits some worse dribbling and LUTS - Prior treatments including Rapaflo, Silodosin Denies hematuria, dysuria, abdominal pain or flank pain   Health Maintenance: UTD Flu vaccine received 07/17/20 [place]   Depression screen [facility] 2/9 08/11/2020 06/29/2020 09/03/2019  Decreased Interest 0 0 0  Down, Depressed, Hopeless 0 0 0  PHQ - 2 Score 0 0 0  Altered sleeping - - -  Tired, decreased energy - - -  Change in appetite - - -  Feeling bad or failure about yourself  - - -  Trouble concentrating - - -  Moving slowly or fidgety/restless - - -  Suicidal thoughts - - -    PHQ-9 Score - - -  Difficult doing work/chores - - -    Past Medical History:  Diagnosis Date  . Arthritis   . BPH (benign prostatic hypertrophy)   . Coronary artery disease   . Dyspnea    d/t heart problem.  worsens in hot or cold extremes  . GERD (gastroesophageal reflux disease)   . Hypertension   . Myocardial infarction [facility])    April 2019. 1 stent placed   Past Surgical History:  Procedure Laterality Date  . CATARACT EXTRACTION W/PHACO Right 04/12/2016   Procedure: CATARACT EXTRACTION PHACO AND INTRAOCULAR LENS PLACEMENT (IOC);  Surgeon: Birder Robson, MD;  Location: [facility] ORS;  Service: Ophthalmology;  Laterality: Right;  [place]' 01:05AP% 26.6CDE 17.3fluid pack lot # [id number]  . CORONARY/GRAFT ACUTE MI REVASCULARIZATION N/A 01/16/2018   Procedure: Coronary/Graft Acute MI Revascularization;  Surgeon: Nelva Bush, MD;  Location: [place] [facility];  Service: Cardiovascular;  Laterality: N/A;  . EYE SURGERY    . KNEE SURGERY Right 09/21/2012   arthroscopy  . LEFT HEART CATH AND CORONARY ANGIOGRAPHY N/A 01/16/2018   Procedure: LEFT HEART CATH AND CORONARY ANGIOGRAPHY;  Surgeon: Nelva Bush, MD;  Location: [place] [facility] LAB;  Service: Cardiovascular;  Laterality: N/A;  . TOTAL KNEE ARTHROPLASTY Right 05/15/2019   Procedure: TOTAL KNEE ARTHROPLASTY;  Surgeon: Lovell Sheehan, MD;  Location: [facility] ORS;  Service: Orthopedics;  Laterality: Right;   Social History   Socioeconomic History  . Marital status: Married    Spouse name: Baldo Ash  .  Number of children: Not on file  . Years of education: 12th grade  . Highest education level: High school graduate  Occupational History  . Occupation: retired  Tobacco Use  . Smoking status: Former Smoker    Types: Cigars    Quit date: 05/07/1974    Years since quitting: 46.2  . Smokeless tobacco: Former Systems developer    Types: [place] date: 10/17/1993  Vaping Use  . Vaping Use: Never used  Substance and Sexual Activity  .  Alcohol use: No  . Drug use: No  . Sexual activity: Not on file  Other Topics Concern  . Not on file  Social History Narrative  . Not on file   Social Determinants of Health   Financial Resource Strain:   . Difficulty of Paying Living Expenses: Not on file  Food Insecurity:   . Worried About Charity fundraiser in the Last Year: Not on file  . Ran Out of Food in the Last Year: Not on file  Transportation Needs:   . Lack of Transportation (Medical): Not on file  . Lack of Transportation (Non-Medical): Not on file  Physical Activity:   . Days of Exercise per Week: Not on file  . Minutes of Exercise per Session: Not on file  Stress:   . Feeling of Stress : Not on file  Social Connections:   . Frequency of Communication with Friends and Family: Not on file  . Frequency of Social Gatherings with Friends and Family: Not on file  . Attends Religious Services: Not on file  . Active Member of Clubs or Organizations: Not on file  . Attends Archivist Meetings: Not on file  . Marital Status: Not on file  Intimate Partner Violence:   . Fear of Current or Ex-Partner: Not on file  . Emotionally Abused: Not on file  . Physically Abused: Not on file  . Sexually Abused: Not on file   Family History  Problem Relation Age of Onset  . Diabetes Mother   . Stroke Mother   . Prostate cancer Brother   . Melanoma Brother   . Hyperlipidemia Paternal Aunt    Current Outpatient Medications on File Prior to Visit  Medication Sig  . aspirin 81 MG tablet Take 1 tablet (81 mg total) by mouth daily.  Marland Kitchen atorvastatin (LIPITOR) 80 MG tablet Take 1 tablet (80 mg total) by mouth daily at 6 PM.  . carvedilol (COREG) 12.5 MG tablet Take 1 tablet (12.5 mg total) by mouth 2 (two) times daily.  . Famotidine (PEPCID AC PO) Take 1 tablet by mouth as needed.  . fluticasone (FLONASE) 50 MCG/ACT nasal spray Place 2 sprays into both nostrils daily. Use for 4-6 weeks then stop and use seasonally or as  needed.  Marland Kitchen losartan (COZAAR) 25 MG tablet Take 1 tablet (25 mg total) by mouth daily.  . tamsulosin (FLOMAX) 0.4 MG CAPS capsule Take 2 capsules (0.8 mg total) by mouth daily.  . ticagrelor (BRILINTA) 60 MG TABS tablet Take 1 tablet (60 mg total) by mouth 2 (two) times daily.   No current facility-administered medications on file prior to visit.    Review of Systems  Constitutional: Negative for activity change, appetite change, chills, diaphoresis, fatigue and fever.  HENT: Negative for congestion and hearing loss.   Eyes: Negative for visual disturbance.  Respiratory: Negative for cough, chest tightness, shortness of breath and wheezing.   Cardiovascular: Negative for chest pain, palpitations and leg swelling.  Gastrointestinal: Negative for abdominal pain, anal bleeding, blood in stool, constipation, diarrhea, nausea and vomiting.  Endocrine: Negative for cold intolerance.  Genitourinary: Positive for difficulty urinating and frequency. Negative for decreased urine volume, dysuria, hematuria and urgency.  Musculoskeletal: Negative for arthralgias, back pain and neck pain.  Skin: Negative for rash.  Allergic/Immunologic: Negative for environmental allergies.  Neurological: Negative for dizziness, weakness, light-headedness, numbness and headaches.  Hematological: Negative for adenopathy.  Psychiatric/Behavioral: Negative for behavioral problems, dysphoric mood and sleep disturbance. The patient is not nervous/anxious.    Per HPI unless specifically indicated above      Objective:    BP (!) 117/59   Pulse 72   Temp 97.7 F (36.5 C) (Temporal)   Resp 16   Ht 5\' 9"  (1.753 m)   Wt 183 lb (83 kg)   SpO2 99%   BMI 27.02 kg/m   Wt Readings from Last 3 Encounters:  08/11/20 183 lb (83 kg)  06/29/20 184 lb 9.6 oz (83.7 kg)  04/29/20 184 lb 4 oz (83.6 kg)    Physical Exam Vitals and nursing note reviewed.  Constitutional:      General: He is not in acute distress.     Appearance: He is well-developed. He is not diaphoretic.     Comments: Well-appearing, comfortable, cooperative  HENT:     Head: Normocephalic and atraumatic.  Eyes:     General:        Right eye: No discharge.        Left eye: No discharge.     Conjunctiva/sclera: Conjunctivae normal.     Pupils: Pupils are equal, round, and reactive to light.  Neck:     Thyroid: No thyromegaly.     Vascular: No carotid bruit.  Cardiovascular:     Rate and Rhythm: Normal rate and regular rhythm.     Heart sounds: Normal heart sounds. No murmur heard.   Pulmonary:     Effort: Pulmonary effort is normal. No respiratory distress.     Breath sounds: Normal breath sounds. No wheezing or rales.  Abdominal:     General: Bowel sounds are normal. There is no distension.     Palpations: Abdomen is soft. There is no mass.     Tenderness: There is no abdominal tenderness.  Musculoskeletal:        General: No tenderness. Normal range of motion.     Cervical back: Normal range of motion and neck supple.     Right lower leg: No edema.     Left lower leg: No edema.     Comments: Upper / Lower Extremities: - Normal muscle tone, strength bilateral upper extremities 5/5, lower extremities 5/5  Lymphadenopathy:     Cervical: No cervical adenopathy.  Skin:    General: Skin is warm and dry.     Findings: No erythema or rash.  Neurological:     Mental Status: He is alert and oriented to person, place, and time.     Comments: Distal sensation intact to light touch all extremities  Psychiatric:        Behavior: Behavior normal.     Comments: Well groomed, good eye contact, normal speech and thoughts      Diabetic Foot Exam - Simple   Simple Foot Form Diabetic Foot exam was performed with the following findings: Yes 08/11/2020  9:18 AM  Visual Inspection See comments: Yes Sensation Testing Intact to touch and monofilament testing bilaterally: Yes Pulse Check Posterior Tibialis and Dorsalis pulse intact  bilaterally: Yes Comments Right foot with lateral foot bony protrusion of metatarsal proximally it is normal but seems enlarged, non tender. No other deformity. No ulceration or callus.    Recent Labs    08/04/20 0821  HGBA1C 6.7*     Results for orders placed or performed in visit on 08/03/20  TSH  Result Value Ref Range   TSH 1.78 0.40 - 4.50 mIU/L  PSA  Result Value Ref Range   PSA 0.62 < OR = 4.0 ng/mL  Lipid panel  Result Value Ref Range   Cholesterol 114 <200 mg/dL   HDL 37 (L) > OR = 40 mg/dL   Triglycerides 81 <150 mg/dL   LDL Cholesterol (Calc) 61 mg/dL (calc)   Total CHOL/HDL Ratio 3.1 <5.0 (calc)   Non-HDL Cholesterol (Calc) 77 <130 mg/dL (calc)  COMPLETE METABOLIC PANEL WITH GFR  Result Value Ref Range   Glucose, Bld 122 (H) 65 - 99 mg/dL   BUN 14 7 - 25 mg/dL   Creat 0.86 0.70 - 1.18 mg/dL   GFR, Est Non African American 85 > OR = 60 mL/min/1.49m2   GFR, Est African American 99 > OR = 60 mL/min/1.66m2   BUN/Creatinine Ratio NOT APPLICABLE 6 - 22 (calc)   Sodium 141 135 - 146 mmol/L   Potassium 4.2 3.5 - 5.3 mmol/L   Chloride 106 98 - 110 mmol/L   CO2 27 20 - 32 mmol/L   Calcium 9.0 8.6 - 10.3 mg/dL   Total Protein 6.6 6.1 - 8.1 g/dL   Albumin 4.5 3.6 - 5.1 g/dL   Globulin 2.1 1.9 - 3.7 g/dL (calc)   AG Ratio 2.1 1.0 - 2.5 (calc)   Total Bilirubin 0.9 0.2 - 1.2 mg/dL   Alkaline phosphatase (APISO) 101 35 - 144 U/L   AST 18 10 - 35 U/L   ALT 16 9 - 46 U/L  CBC with Differential/Platelet  Result Value Ref Range   WBC 6.5 3.8 - 10.8 Thousand/uL   RBC 4.54 4.20 - 5.80 Million/uL   Hemoglobin 14.0 13.2 - 17.1 g/dL   HCT 41.3 38 - 50 %   MCV 91.0 80.0 - 100.0 fL   MCH 30.8 27.0 - 33.0 pg   MCHC 33.9 32.0 - 36.0 g/dL   RDW 12.1 11.0 - 15.0 %   Platelets 238 140 - 400 Thousand/uL   MPV 9.7 7.5 - 12.5 fL   Neutro Abs 3,959 1,500 - 7,800 cells/uL   Lymphs Abs 1,645 850 - 3,900 cells/uL   Absolute Monocytes 618 200 - 950 cells/uL   Eosinophils Absolute  221 15.0 - 500.0 cells/uL   Basophils Absolute 59 0.0 - 200.0 cells/uL   Neutrophils Relative % 60.9 %   Total Lymphocyte 25.3 %   Monocytes Relative 9.5 %   Eosinophils Relative 3.4 %   Basophils Relative 0.9 %  Hemoglobin A1c  Result Value Ref Range   Hgb A1c MFr Bld 6.7 (H) <5.7 % of total Hgb   Mean Plasma Glucose 146 (calc)   eAG (mmol/L) 8.1 (calc)      Assessment & Plan:   Problem List Items Addressed This Visit    Type 2 diabetes mellitus with other specified complication ([place])    Well-controlled DM with A1c 6.7 at goal No known complications or hypoglycemia. Complications - other including hyperlipidemia and vascular disease CAD, GERD - increases risk of future cardiovascular complications   Plan 1. No DM medication required. Diet controlled 2. Encourage improved lifestyle - low carb,  low sugar diet, reduce portion size, continue improving regular exercise 3. Check CBG , bring log to next visit for review 4. Continue ASA, ARB, Statin 5. Needs to re-schedule for DM Eye from [facility] - last time 11/2018 they did not know he was diabetic - DM Foot exam done 6. Follow-up 6 months A1c      Paroxysmal atrial fibrillation (HCC)    Stable Followed by Cardiology On anti-platelet brilinta and aspirin - he can discuss with cardiology on dose adjust if needed On rate control      Hyperlipidemia associated with type 2 diabetes mellitus ([place])    Controlled cholesterol on statin and lifestyle Last lipid panel 07/2020 Known CAD  Plan: 1. Continue current meds - Atorvastating 80mg  daily 2. Continue DAPT - Brilinta / ASA 81mg  for primary ASCVD risk reduction 3. Encourage improved lifestyle - low carb/cholesterol, reduce portion size, continue improving regular exercise      Essential (primary) hypertension    Controlled HTN No known complications  Plan: 1. Continue Carvedilol 12.5mg  BID -  indication with s/p MI - Continue on Losartan 25mg  daily - Encouraged low  sodium diet, regular exercise Monitor BP at home      Benign prostatic hyperplasia with lower urinary tract symptoms    Worse LUTS now dribbling - AUA BPH score 8 up to 11 - On Tamsulosin 0.4mg  daily - Last PSA 0.4 > 0.6 > 0.64 (07/2020) - brother has prostate CA - Last DRE mild enlarged prostate, unremarkable (07/10/18)  Plan: 1. Increase dose Tamsulosin 0.4mg  x2 = 0.8mg  daily for BPH LUTS - caution sudden position change dizziness side effect  May return to Urology in future if difficulty voiding or worse symptoms.      Relevant Medications   tamsulosin (FLOMAX) 0.4 MG CAPS capsule    Other Visit Diagnoses    Annual physical exam    -  Primary      Updated Health Maintenance information Reviewed recent lab results with patient Encouraged improvement to lifestyle with diet and exercise - Goal of weight loss   No orders of the defined types were placed in this encounter.     Follow up plan: Return in about 6 months (around 02/09/2021) for 6 month follow-up DM A1c, HTN BPH.  Nobie Putnam, DO [facility] 08/11/2020, 9:11 AM

## 2020-08-11 NOTE — Assessment & Plan Note (Signed)
Well-controlled DM with A1c 6.7 at goal No known complications or hypoglycemia. Complications - other including hyperlipidemia and vascular disease CAD, GERD - increases risk of future cardiovascular complications   Plan 1. No DM medication required. Diet controlled 2. Encourage improved lifestyle - low carb, low sugar diet, reduce portion size, continue improving regular exercise 3. Check CBG , bring log to next visit for review 4. Continue ASA, ARB, Statin 5. Needs to re-schedule for DM Eye from [facility] - last time 11/2018 they did not know he was diabetic - DM Foot exam done 6. Follow-up 6 months A1c

## 2020-08-11 NOTE — Assessment & Plan Note (Signed)
Worse LUTS now dribbling - AUA BPH score 8 up to 11 - On Tamsulosin 0.4mg  daily - Last PSA 0.4 > 0.6 > 0.64 (07/2020) - brother has prostate CA - Last DRE mild enlarged prostate, unremarkable (07/10/18)  Plan: 1. Increase dose Tamsulosin 0.4mg  x2 = 0.8mg  daily for BPH LUTS - caution sudden position change dizziness side effect  May return to Urology in future if difficulty voiding or worse symptoms.

## 2020-08-11 NOTE — Patient Instructions (Addendum)
Thank you for coming to the office today.  Double dose of Tamsulosin from one tab 0.4 now to two tabs at once, can split the dose if you prefer.  Try for 1 week  We can order more once you let us know.  Keep up the great work.  Please schedule a Follow-up Appointment to: Return in about 6 months (around 02/09/2021) for 6 month follow-up DM A1c, HTN BPH.  If you have any other questions or concerns, please feel free to call the office or send a message through [place]. You may also schedule an earlier appointment if necessary.  Additionally, you may be receiving a survey about your experience at our office within a few days to 1 week by e-mail or mail. We value your feedback.  Nobie Putnam, DO [place]

## 2020-08-11 NOTE — Assessment & Plan Note (Signed)
Stable Followed by Cardiology On anti-platelet brilinta and aspirin - he can discuss with cardiology on dose adjust if needed On rate control

## 2020-08-12 NOTE — Assessment & Plan Note (Signed)
Controlled HTN No known complications  Plan: 1. Continue Carvedilol 12.5mg BID -  indication with s/p MI - Continue on Losartan 25mg daily - Encouraged low sodium diet, regular exercise - Monitor BP at home 

## 2020-08-12 NOTE — Assessment & Plan Note (Signed)
Controlled cholesterol on statin and lifestyle Last lipid panel 07/2020 Known CAD  Plan: 1. Continue current meds - Atorvastating 80mg  daily 2. Continue DAPT - Brilinta / ASA 81mg  for primary ASCVD risk reduction 3. Encourage improved lifestyle - low carb/cholesterol, reduce portion size, continue improving regular exercise

## 2020-09-08 ENCOUNTER — Ambulatory Visit (INDEPENDENT_AMBULATORY_CARE_PROVIDER_SITE_OTHER): Payer: PPO

## 2020-09-08 VITALS — Ht 69.0 in | Wt 184.0 lb

## 2020-09-08 DIAGNOSIS — Z0000 Encounter for general adult medical examination without abnormal findings: Secondary | ICD-10-CM

## 2020-09-08 NOTE — Progress Notes (Signed)
I connected with Barry [name] today by telephone and verified that I am speaking with the correct person using two identifiers. Location patient: home Location provider: work Persons participating in the virtual visit: Barry [name], Barry [name].   I discussed the limitations, risks, security and privacy concerns of performing an evaluation and management service by telephone and the availability of in person appointments. I also discussed with the patient that there may be a patient responsible charge related to this service. The patient expressed understanding and verbally consented to this telephonic visit.    Interactive audio and video telecommunications were attempted between this provider and patient, however failed, due to patient having technical difficulties OR patient did not have access to video capability.  We continued and completed visit with audio only.     Vital signs may be patient reported or missing.  Subjective:   Barry [name] is a 74 y.o. male who presents for Medicare Annual/Subsequent preventive examination.  Review of Systems     Cardiac Risk Factors include: advanced age (>61men, >35 women);diabetes mellitus;dyslipidemia;hypertension;male gender;sedentary lifestyle     Objective:    Today's Vitals   09/08/20 1016  Weight: 184 lb (83.5 kg)  Height: 5\' 9"  (1.753 m)   Body mass index is 27.17 kg/m.  Advanced Directives 09/08/2020 05/15/2019 05/08/2019 08/14/2018 02/05/2018 01/16/2018 06/27/2017  Does Patient Have a Medical Advance Directive? No No No No No No No  Would patient like information on creating a medical advance directive? - No - Patient declined - No - Patient declined No - Patient declined No - Patient declined No - Patient declined    Current Medications (verified) Outpatient Encounter Medications as of 09/08/2020  Medication Sig  . aspirin 81 MG tablet Take 1 tablet (81 mg total) by mouth daily.  Marland Kitchen atorvastatin (LIPITOR) 80 MG  tablet Take 1 tablet (80 mg total) by mouth daily at 6 PM.  . carvedilol (COREG) 12.5 MG tablet Take 1 tablet (12.5 mg total) by mouth 2 (two) times daily.  . Famotidine (PEPCID AC PO) Take 1 tablet by mouth as needed.  . fluticasone (FLONASE) 50 MCG/ACT nasal spray Place 2 sprays into both nostrils daily. Use for 4-6 weeks then stop and use seasonally or as needed.  Marland Kitchen losartan (COZAAR) 25 MG tablet Take 1 tablet (25 mg total) by mouth daily.  . tamsulosin (FLOMAX) 0.4 MG CAPS capsule Take 2 capsules (0.8 mg total) by mouth daily.  . ticagrelor (BRILINTA) 60 MG TABS tablet Take 1 tablet (60 mg total) by mouth 2 (two) times daily.   No facility-administered encounter medications on file as of 09/08/2020.    Allergies (verified) Lisinopril   History: Past Medical History:  Diagnosis Date  . Arthritis   . BPH (benign prostatic hypertrophy)   . Coronary artery disease   . Dyspnea    d/t heart problem.  worsens in hot or cold extremes  . GERD (gastroesophageal reflux disease)   . Hypertension   . Myocardial infarction [facility])    April 2019. 1 stent placed   Past Surgical History:  Procedure Laterality Date  . CATARACT EXTRACTION W/PHACO Right 04/12/2016   Procedure: CATARACT EXTRACTION PHACO AND INTRAOCULAR LENS PLACEMENT (IOC);  Surgeon: Birder Robson, MD;  Location: [facility] ORS;  Service: Ophthalmology;  Laterality: Right;  [place]' 01:05AP% 26.6CDE 17.65fluid pack lot # [id number]  . CORONARY/GRAFT ACUTE MI REVASCULARIZATION N/A 01/16/2018   Procedure: Coronary/Graft Acute MI Revascularization;  Surgeon: Nelva Bush, MD;  Location: [place] [facility];  Service: Cardiovascular;  Laterality: N/A;  . EYE SURGERY    . KNEE SURGERY Right 09/21/2012   arthroscopy  . LEFT HEART CATH AND CORONARY ANGIOGRAPHY N/A 01/16/2018   Procedure: LEFT HEART CATH AND CORONARY ANGIOGRAPHY;  Surgeon: Nelva Bush, MD;  Location: [place] [facility];  Service: Cardiovascular;  Laterality: N/A;  . TOTAL  KNEE ARTHROPLASTY Right 05/15/2019   Procedure: TOTAL KNEE ARTHROPLASTY;  Surgeon: Lovell Sheehan, MD;  Location: [facility] ORS;  Service: Orthopedics;  Laterality: Right;   Family History  Problem Relation Age of Onset  . Diabetes Mother   . Stroke Mother   . Prostate cancer Brother   . Melanoma Brother   . Hyperlipidemia Paternal Aunt    Social History   Socioeconomic History  . Marital status: Married    Spouse name: Barry [name]  . Number of children: Not on file  . Years of education: 12th grade  . Highest education level: High school graduate  Occupational History  . Occupation: retired  Tobacco Use  . Smoking status: Former Smoker    Types: Cigars    Quit date: 05/07/1974    Years since quitting: 46.3  . Smokeless tobacco: Former Systems developer    Types: [place] date: 10/17/1993  Vaping Use  . Vaping Use: Never used  Substance and Sexual Activity  . Alcohol use: No  . Drug use: No  . Sexual activity: Not on file  Other Topics Concern  . Not on file  Social History Narrative  . Not on file   Social Determinants of Health   Financial Resource Strain: Low Risk   . Difficulty of Paying Living Expenses: Not hard at all  Food Insecurity: No Food Insecurity  . Worried About Charity fundraiser in the Last Year: Never true  . Ran Out of Food in the Last Year: Never true  Transportation Needs: No Transportation Needs  . Lack of Transportation (Medical): No  . Lack of Transportation (Non-Medical): No  Physical Activity: Inactive  . Days of Exercise per Week: 0 days  . Minutes of Exercise per Session: 0 min  Stress: No Stress Concern Present  . Feeling of Stress : Not at all  Social Connections:   . Frequency of Communication with Friends and Family: Not on file  . Frequency of Social Gatherings with Friends and Family: Not on file  . Attends Religious Services: Not on file  . Active Member of Clubs or Organizations: Not on file  . Attends Archivist Meetings: Not on  file  . Marital Status: Not on file    Tobacco Counseling Counseling given: Not Answered   Clinical Intake:  Pre-visit preparation completed: Yes  Pain : No/denies pain     Nutritional Status: BMI 25 -29 Overweight Nutritional Risks: None Diabetes: Yes  How often do you need to have someone help you when you read instructions, pamphlets, or other written materials from your doctor or pharmacy?: 1 - Never What is the last grade level you completed in school?: 12th grade  Diabetic? Yes Nutrition Risk Assessment:  Has the patient had any N/V/D within the last 2 months?  No  Does the patient have any non-healing wounds?  No  Has the patient had any unintentional weight loss or weight gain?  No   Diabetes:  Is the patient diabetic?  Yes  If diabetic, was a CBG obtained today?  No  Did the patient bring in their glucometer from home?  No  How often do you monitor your CBG's? Does not.   Financial Strains and Diabetes Management:  Are you having any financial strains with the device, your supplies or your medication? No .  Does the patient want to be seen by Chronic Care Management for management of their diabetes?  No  Would the patient like to be referred to a Nutritionist or for Diabetic Management?  No   Diabetic Exams:  Diabetic Eye Exam: Overdue for diabetic eye exam. Pt has been advised about the importance in completing this exam. Patient advised to call and schedule an eye exam. Diabetic Foot Exam: Completed 08/11/2020   Interpreter Needed?: No  Information entered by :: NAllen [name]   Activities of Daily Living In your present state of health, do you have any difficulty performing the following activities: 09/08/2020  Hearing? N  Vision? N  Difficulty concentrating or making decisions? N  Walking or climbing stairs? N  Dressing or bathing? N  Doing errands, shopping? N  Preparing Food and eating ? N  Using the Toilet? N  In the past six months, have  you accidently leaked urine? N  Do you have problems with loss of bowel control? N  Managing your Medications? N  Managing your Finances? N  Housekeeping or managing your Housekeeping? N  Some recent data might be hidden    Patient Care Team: Olin Hauser, DO as PCP - General (Family Medicine) Rockey Situ Kathlene November, MD as PCP - Cardiology (Cardiology) Minna Merritts, MD as Consulting Physician (Cardiology)  Indicate any recent Medical Services you may have received from other than [facility] providers in the past year (date may be approximate).     Assessment:   This is a routine wellness examination for Rajah.  Hearing/Vision screen  Hearing Screening   125Hz  250Hz  500Hz  1000Hz  2000Hz  3000Hz  4000Hz  6000Hz  8000Hz   Right ear:           Left ear:           Vision Screening Comments: Regular eye exams, [facility]  Dietary issues and exercise activities discussed: Current Exercise Habits: The patient does not participate in regular exercise at present  Goals    . Exercise 150 min/wk Moderate Activity     Pt purchased stationary bicycle after cardiac rehab. Continue using 3-5 times per week for 30 minutes.    . Increase physical activity     Current activity is walking occasionally- limited due to knee.  Suggestions for walking- walking laps in a pool and suggested [employer].     . Patient Stated     09/08/2020, no goals      Depression Screen PHQ 2/9 Scores 09/08/2020 08/11/2020 06/29/2020 09/03/2019 07/22/2019 04/16/2019 01/09/2019  PHQ - 2 Score 0 0 0 0 0 0 0  PHQ- 9 Score - - - - - - -    Fall Risk Fall Risk  09/08/2020 08/11/2020 06/29/2020 05/12/2020 09/03/2019  Falls in the past year? 0 0 0 0 0  Comment - - - Emmi Telephone Survey: data to providers prior to load -  Number falls in past yr: - 0 0 - 0  Injury with Fall? - 0 0 - 0  Risk for fall due to : Medication side effect - - - -  Follow up Falls evaluation completed;Education  provided;Falls prevention discussed Falls evaluation completed Falls evaluation completed - -    Any stairs in or around the home? No  If so, are there any without  handrails? n/a Home free of loose throw rugs in walkways, pet beds, electrical cords, etc? Yes  Adequate lighting in your home to reduce risk of falls? Yes   ASSISTIVE DEVICES UTILIZED TO PREVENT FALLS:  Life alert? No  Use of a cane, walker or w/c? No  Grab bars in the bathroom? Yes  Shower chair or bench in shower? Yes  Elevated toilet seat or a handicapped toilet? Yes   TIMED UP AND GO:  Was the test performed? No .      Cognitive Function: MMSE - Mini Mental State Exam 05/19/2015  Orientation to time 5  Orientation to Place 5  Registration 3  Attention/ Calculation 5  Recall 3  Language- name 2 objects 2  Language- repeat 1  Language- follow 3 step command 3  Language- read & follow direction 1  Write a sentence 1  Copy design 1  Total score 30     6CIT Screen 09/08/2020 08/14/2018 06/27/2017  What Year? 0 points 0 points 0 points  What month? 0 points 0 points 0 points  What time? 0 points 0 points 0 points  Count back from 20 0 points 0 points 0 points  Months in reverse 0 points 0 points 0 points  Repeat phrase 0 points 2 points 2 points  Total Score 0 2 2    Immunizations Immunization History  Administered Date(s) Administered  . Influenza, High Dose Seasonal PF 07/28/2015, 08/14/2018  . Influenza,inj,quad, With Preservative 07/31/2017, 07/18/2019  . Influenza-Unspecified 06/17/2014, 07/20/2017, 07/17/2020  . PFIZER SARS-COV-2 Vaccination 11/29/2019, 12/20/2019  . Pneumococcal Conjugate-13 05/07/2014  . Pneumococcal Polysaccharide-23 10/12/2011  . Pneumococcal-Unspecified 09/17/2011  . Td 05/18/2011  . Tdap 05/17/2008    TDAP status: Up to date Flu Vaccine status: Up to date Pneumococcal vaccine status: Up to date Covid-19 vaccine status: Completed vaccines  Qualifies for Shingles  Vaccine? Yes   Zostavax completed Yes   Shingrix Completed?: No.    Education has been provided regarding the importance of this vaccine. Patient has been advised to call insurance company to determine out of pocket expense if they have not yet received this vaccine. Advised may also receive vaccine at local pharmacy or Health Dept. Verbalized acceptance and understanding.  Screening Tests Health Maintenance  Topic Date Due  . OPHTHALMOLOGY EXAM  11/20/2019  . HEMOGLOBIN A1C  02/02/2021  . TETANUS/TDAP  05/17/2021  . FOOT EXAM  08/11/2021  . COLONOSCOPY  06/29/2024  . INFLUENZA VACCINE  Completed  . COVID-19 Vaccine  Completed  . Hepatitis C Screening  Completed  . PNA vac Low Risk Adult  Completed    Health Maintenance  Health Maintenance Due  Topic Date Due  . OPHTHALMOLOGY EXAM  11/20/2019    Colorectal cancer screening: Completed 06/29/2014. Repeat every 10 years  Lung Cancer Screening: (Low Dose CT Chest recommended if Age 60-80 years, 30 pack-year currently smoking OR have quit w/in 15years.) does not qualify.   Lung Cancer Screening Referral: no  Additional Screening:  Hepatitis C Screening: does qualify; Completed 07/15/2019  Vision Screening: Recommended annual ophthalmology exams for early detection of glaucoma and other disorders of the eye. Is the patient up to date with their annual eye exam?  No  Who is the provider or what is the name of the office in which the patient attends annual eye exams? [facility] If pt is not established with a provider, would they like to be referred to a provider to establish care? No .  Dental Screening: Recommended annual dental exams for proper oral hygiene  Community Resource Referral / Chronic Care Management: CRR required this visit?  No   CCM required this visit?  No      Plan:     I have personally reviewed and noted the following in the patient's chart:   . Medical and social history . Use of alcohol,  tobacco or illicit drugs  . Current medications and supplements . Functional ability and status . Nutritional status . Physical activity . Advanced directives . List of other physicians . Hospitalizations, surgeries, and ER visits in previous 12 months . Vitals . Screenings to include cognitive, depression, and falls . Referrals and appointments  In addition, I have reviewed and discussed with patient certain preventive protocols, quality metrics, and best practice recommendations. A written personalized care plan for preventive services as well as general preventive health recommendations were provided to patient.     Kellie Simmering, [name]   [id number]/71/[id number]   Nurse Notes:

## 2020-09-08 NOTE — Patient Instructions (Signed)
[name] , Thank you for taking time to come for your Medicare Wellness Visit. I appreciate your ongoing commitment to your health goals. Please review the following plan we discussed and let me know if I can assist you in the future.   Screening recommendations/referrals: Colonoscopy: not required Recommended yearly ophthalmology/optometry visit for glaucoma screening and checkup Recommended yearly dental visit for hygiene and checkup  Vaccinations: Influenza vaccine: completed 07/17/2020, due 05/17/2021 Pneumococcal vaccine: completed 05/07/2014 Tdap vaccine: completed 05/18/2011, due 05/17/2021 Shingles vaccine: discussed   Covid-19:  12/20/2019, 11/29/2019  Advanced directives: Advance directive discussed with you today.   Conditions/risks identified: none  Next appointment: Follow up in one year for your annual wellness visit.   Preventive Care 75 Years and Older, Male Preventive care refers to lifestyle choices and visits with your health care provider that can promote health and wellness. What does preventive care include?  A yearly physical exam. This is also called an annual well check.  Dental exams once or twice a year.  Routine eye exams. Ask your health care provider how often you should have your eyes checked.  Personal lifestyle choices, including:  Daily care of your teeth and gums.  Regular physical activity.  Eating a healthy diet.  Avoiding tobacco and drug use.  Limiting alcohol use.  Practicing safe sex.  Taking low doses of aspirin every day.  Taking vitamin and mineral supplements as recommended by your health care provider. What happens during an annual well check? The services and screenings done by your health care provider during your annual well check will depend on your age, overall health, lifestyle risk factors, and family history of disease. Counseling  Your health care provider may ask you questions about your:  Alcohol use.  Tobacco  use.  Drug use.  Emotional well-being.  Home and relationship well-being.  Sexual activity.  Eating habits.  History of falls.  Memory and ability to understand (cognition).  Work and work Statistician. Screening  You may have the following tests or measurements:  Height, weight, and BMI.  Blood pressure.  Lipid and cholesterol levels. These may be checked every 5 years, or more frequently if you are over 47 years old.  Skin check.  Lung cancer screening. You may have this screening every year starting at age 74 if you have a 30-pack-year history of smoking and currently smoke or have quit within the past 15 years.  Fecal occult blood test (FOBT) of the stool. You may have this test every year starting at age 74.  Flexible sigmoidoscopy or colonoscopy. You may have a sigmoidoscopy every 5 years or a colonoscopy every 10 years starting at age 74.  Prostate cancer screening. Recommendations will vary depending on your family history and other risks.  Hepatitis C blood test.  Hepatitis B blood test.  Sexually transmitted disease (STD) testing.  Diabetes screening. This is done by checking your blood sugar (glucose) after you have not eaten for a while (fasting). You may have this done every 1-3 years.  Abdominal aortic aneurysm (AAA) screening. You may need this if you are a current or former smoker.  Osteoporosis. You may be screened starting at age 74 if you are at high risk. Talk with your health care provider about your test results, treatment options, and if necessary, the need for more tests. Vaccines  Your health care provider may recommend certain vaccines, such as:  Influenza vaccine. This is recommended every year.  Tetanus, diphtheria, and acellular pertussis (Tdap, Td)  vaccine. You may need a Td booster every 10 years.  Zoster vaccine. You may need this after age 74.  Pneumococcal 13-valent conjugate (PCV13) vaccine. One dose is recommended after age  74.  Pneumococcal polysaccharide (PPSV23) vaccine. One dose is recommended after age 74. Talk to your health care provider about which screenings and vaccines you need and how often you need them. This information is not intended to replace advice given to you by your health care provider. Make sure you discuss any questions you have with your health care provider. Document Released: 10/30/2015 Document Revised: 06/22/2016 Document Reviewed: 08/04/2015 Elsevier Interactive Patient Education  2017 [place] Prevention in the Home Falls can cause injuries. They can happen to people of all ages. There are many things you can do to make your home safe and to help prevent falls. What can I do on the outside of my home?  Regularly fix the edges of walkways and driveways and fix any cracks.  Remove anything that might make you trip as you walk through a door, such as a raised step or threshold.  Trim any bushes or trees on the path to your home.  Use bright outdoor lighting.  Clear any walking paths of anything that might make someone trip, such as rocks or tools.  Regularly check to see if handrails are loose or broken. Make sure that both sides of any steps have handrails.  Any raised decks and porches should have guardrails on the edges.  Have any leaves, snow, or ice cleared regularly.  Use sand or salt on walking paths during winter.  Clean up any spills in your garage right away. This includes oil or grease spills. What can I do in the bathroom?  Use night lights.  Install grab bars by the toilet and in the tub and shower. Do not use towel bars as grab bars.  Use non-skid mats or decals in the tub or shower.  If you need to sit down in the shower, use a plastic, non-slip stool.  Keep the floor dry. Clean up any water that spills on the floor as soon as it happens.  Remove soap buildup in the tub or shower regularly.  Attach bath mats securely with double-sided  non-slip rug tape.  Do not have throw rugs and other things on the floor that can make you trip. What can I do in the bedroom?  Use night lights.  Make sure that you have a light by your bed that is easy to reach.  Do not use any sheets or blankets that are too big for your bed. They should not hang down onto the floor.  Have a firm chair that has side arms. You can use this for support while you get dressed.  Do not have throw rugs and other things on the floor that can make you trip. What can I do in the kitchen?  Clean up any spills right away.  Avoid walking on wet floors.  Keep items that you use a lot in easy-to-reach places.  If you need to reach something above you, use a strong step stool that has a grab bar.  Keep electrical cords out of the way.  Do not use floor polish or wax that makes floors slippery. If you must use wax, use non-skid floor wax.  Do not have throw rugs and other things on the floor that can make you trip. What can I do with my stairs?  Do not leave  any items on the stairs.  Make sure that there are handrails on both sides of the stairs and use them. Fix handrails that are broken or loose. Make sure that handrails are as long as the stairways.  Check any carpeting to make sure that it is firmly attached to the stairs. Fix any carpet that is loose or worn.  Avoid having throw rugs at the top or bottom of the stairs. If you do have throw rugs, attach them to the floor with carpet tape.  Make sure that you have a light switch at the top of the stairs and the bottom of the stairs. If you do not have them, ask someone to add them for you. What else can I do to help prevent falls?  Wear shoes that:  Do not have high heels.  Have rubber bottoms.  Are comfortable and fit you well.  Are closed at the toe. Do not wear sandals.  If you use a stepladder:  Make sure that it is fully opened. Do not climb a closed stepladder.  Make sure that both  sides of the stepladder are locked into place.  Ask someone to hold it for you, if possible.  Clearly mark and make sure that you can see:  Any grab bars or handrails.  First and last steps.  Where the edge of each step is.  Use tools that help you move around (mobility aids) if they are needed. These include:  Canes.  Walkers.  Scooters.  Crutches.  Turn on the lights when you go into a dark area. Replace any light bulbs as soon as they burn out.  Set up your furniture so you have a clear path. Avoid moving your furniture around.  If any of your floors are uneven, fix them.  If there are any pets around you, be aware of where they are.  Review your medicines with your doctor. Some medicines can make you feel dizzy. This can increase your chance of falling. Ask your doctor what other things that you can do to help prevent falls. This information is not intended to replace advice given to you by your health care provider. Make sure you discuss any questions you have with your health care provider. Document Released: 07/30/2009 Document Revised: 03/10/2016 Document Reviewed: 11/07/2014 Elsevier Interactive Patient Education  2017 [employer].

## 2020-09-15 ENCOUNTER — Other Ambulatory Visit: Payer: Self-pay | Admitting: Family Medicine

## 2020-09-15 DIAGNOSIS — N401 Enlarged prostate with lower urinary tract symptoms: Secondary | ICD-10-CM

## 2020-09-15 MED ORDER — TAMSULOSIN HCL 0.4 MG PO CAPS: 0.8000 mg | ORAL_CAPSULE | Freq: Every day | ORAL | 3 refills | Status: DC

## 2020-09-15 NOTE — Telephone Encounter (Signed)
Medication Refill - Medication Flomax generic 0.4 mg twice a day 90 tablets with 3 refills  Has the patient contacted their pharmacy? No. (Agent: If no, request that the patient contact the pharmacy for the refill.) (Agent: If yes, when and what did the pharmacy advise?)  Preferred Pharmacy (with phone number or street name): [place]   Agent: Please be advised that RX refills may take up to 3 business days. We ask that you follow-up with your pharmacy.

## 2020-10-11 NOTE — Progress Notes (Signed)
Cardiology Office Note  Date:  10/12/2020   ID:  Barry [name], Barry [name] 1945/12/21, MRN [id number]  PCP:  Olin Hauser, DO   Chief Complaint  Patient presents with  . Follow-up    6 Month follow up. Medications verbally reviewed with patient.     HPI:  74 year old gentleman with history of  CAD, prior PCI to mid RCA 01/2018 hypertension,  hyperlipidemia,  arthritis,  Anxiety  Chronic right knee pain,  Diabetes type 2, HBA1C 6.4 Ejection fraction 45 to 50% April 2019 Intolerance to several blood pressure medications as detailed below who presents for follow-up of his coronary disease,  STEMI April 2019, proximal to mid RCA April 2019  Last seen in clinic by myself July 2021 At that time was active, biking Retired, mowing, works in the garden  Previously reported shortness of breath This is a chronic issue Prior work-up including echocardiogram December 2020, normal ejection fraction EF 55%  Bike quit on him, does not have a exercise program Has a dog, does not take him for a walk Sedentary [employer] with his wife  Reports having a chronic cough Chronic sinus drainage,  Has not tried PPI  brilinta expensive but wants to stay on it Discussed alternatives including Plavix but is not interested at this time  Other past medical history reviewed Paroxysmal atrial fibrillation noted prior to PCI, details unclear  Echocardiogram - Left ventricle: The cavity size was normal. Wall thickness was   increased in a pattern of mild LVH. Systolic function was mildly   reduced. The estimated ejection fraction was in the range of 45%   to 50%. There is hypokinesis of the basal-midinferolateral and   inferior myocardium. Left ventricular diastolic function   parameters were normal for the patient&'s age. - Aortic valve: There was trivial regurgitation. - Right ventricle: The cavity size was normal. Systolic function   was mildly to moderately reduced.  January 16, 2018 chest pain, d/c on 01/18/2018 Cardiac catheterization performed inferior STEMI thrombotic occlusion to the proximal RCA moderate nonobstructive coronary disease of LAD and left circumflex Proximal to mid LAD is aneurysmal Drug-eluting stent placed to proximal to mid RCA 3.0 x 38 mm  Peak troponin 54 on January 17, 2018  Previous side effects to various medications Amlodipine, he developed swelling HCTZ, reported having dry skin, dry mouth Lisinopril, developed a cough Atenolol: did not work Coreg: ok so far  Family history of diabetes, MI Mom with CVA Dad with alzheimers  PMH:   has a past medical history of Arthritis, BPH (benign prostatic hypertrophy), Coronary artery disease, Dyspnea, GERD (gastroesophageal reflux disease), Hypertension, and Myocardial infarction ([place]).  PSH:    Past Surgical History:  Procedure Laterality Date  . CATARACT EXTRACTION W/PHACO Right 04/12/2016   Procedure: CATARACT EXTRACTION PHACO AND INTRAOCULAR LENS PLACEMENT (IOC);  Surgeon: Birder Robson, MD;  Location: [facility];  Service: Ophthalmology;  Laterality: Right;  [place]' 01:05AP% 26.6CDE 17.21fluid pack lot # [id number]  . CORONARY/GRAFT ACUTE MI REVASCULARIZATION N/A 01/16/2018   Procedure: Coronary/Graft Acute MI Revascularization;  Surgeon: Nelva Bush, MD;  Location: [place] [facility];  Service: Cardiovascular;  Laterality: N/A;  . EYE SURGERY    . KNEE SURGERY Right 09/21/2012   arthroscopy  . LEFT HEART CATH AND CORONARY ANGIOGRAPHY N/A 01/16/2018   Procedure: LEFT HEART CATH AND CORONARY ANGIOGRAPHY;  Surgeon: Nelva Bush, MD;  Location: [place] [facility];  Service: Cardiovascular;  Laterality: N/A;  . TOTAL KNEE ARTHROPLASTY Right 05/15/2019  Procedure: TOTAL KNEE ARTHROPLASTY;  Surgeon: Lovell Sheehan, MD;  Location: [facility];  Service: Orthopedics;  Laterality: Right;    Current Outpatient Medications  Medication Sig Dispense Refill  . aspirin 81 MG tablet Take 1 tablet  (81 mg total) by mouth daily. 30 tablet   . Famotidine (PEPCID AC PO) Take 1 tablet by mouth as needed.    . fluticasone (FLONASE) 50 MCG/ACT nasal spray Place 2 sprays into both nostrils daily. Use for 4-6 weeks then stop and use seasonally or as needed. 16 g 0  . tamsulosin (FLOMAX) 0.4 MG CAPS capsule Take 2 capsules (0.8 mg total) by mouth daily. 180 capsule 3  . atorvastatin (LIPITOR) 80 MG tablet Take 1 tablet (80 mg total) by mouth daily at 6 PM. 90 tablet 3  . carvedilol (COREG) 12.5 MG tablet Take 1 tablet (12.5 mg total) by mouth 2 (two) times daily. 180 tablet 3  . losartan (COZAAR) 25 MG tablet Take 1 tablet (25 mg total) by mouth daily. 90 tablet 3  . ticagrelor (BRILINTA) 60 MG TABS tablet Take 1 tablet (60 mg total) by mouth 2 (two) times daily. 180 tablet 3   No current facility-administered medications for this visit.    Allergies:   Lisinopril   Social History:  The patient  reports that he quit smoking about 46 years ago. His smoking use included cigars. He quit smokeless tobacco use about 27 years ago.  His smokeless tobacco use included chew. He reports that he does not drink alcohol and does not use drugs.   Family History:   family history includes Diabetes in his mother; Hyperlipidemia in his paternal aunt; Melanoma in his brother; Prostate cancer in his brother; Stroke in his mother.    Review of Systems: Review of Systems  Constitutional: Negative.   HENT: Negative.   Respiratory: Positive for shortness of breath.   Cardiovascular: Positive for palpitations.  Gastrointestinal: Negative.   Musculoskeletal: Negative.   Neurological: Negative.   Psychiatric/Behavioral: Negative.   All other systems reviewed and are negative.   PHYSICAL EXAM: VS:  BP 128/70 (BP Location: Left Arm, Patient Position: Sitting, Cuff Size: Normal)   Pulse 82   Ht 5\' 9"  (1.753 m)   Wt 186 lb (84.4 kg)   SpO2 96%   BMI 27.47 kg/m  , BMI Body mass index is 27.47 kg/m.   Constitutional:  oriented to person, place, and time. No distress.  HENT:  Head: Grossly normal Eyes:  no discharge. No scleral icterus.  Neck: No JVD, no carotid bruits  Cardiovascular: Regular rate and rhythm, no murmurs appreciated Pulmonary/Chest: Clear to auscultation bilaterally, no wheezes or rails Abdominal: Soft.  no distension.  no tenderness.  Musculoskeletal: Normal range of motion Neurological:  normal muscle tone. Coordination normal. No atrophy Skin: Skin warm and dry Psychiatric: normal affect, pleasant  Recent Labs: 08/04/2020: ALT 16; BUN 14; Creat 0.86; Hemoglobin 14.0; Platelets 238; Potassium 4.2; Sodium 141; TSH 1.78    Lipid Panel Lab Results  Component Value Date   CHOL 114 08/04/2020   HDL 37 (L) 08/04/2020   LDLCALC 61 08/04/2020   TRIG 81 08/04/2020      Wt Readings from Last 3 Encounters:  10/12/20 186 lb (84.4 kg)  09/08/20 184 lb (83.5 kg)  08/11/20 183 lb (83 kg)     ASSESSMENT AND PLAN:  Coronary artery disease with stable angina Prior STEMI presented April 2019 with STEMI occluded proximal RCA nonobstructive LAD disease, circumflex disease Echocardiogram  normal ejection fraction December 2020 Chronic shortness of breath since that time likely multifactorial including deconditioning He prefers to stay on aspirin with Brilinta  Essential (primary) hypertension - Blood pressure is well controlled on today's visit. No changes made to the medications.  Pure hypercholesterolemia -  Cholesterol is at goal on the current lipid regimen. No changes to the medications were made.  Diabetes mellitus due to underlying condition with hyperosmolarity without coma, without long-term current use of insulin (HCC) Recommended low carbohydrate diet, walking program  Shortness of breath Recommend walking program, weight loss, weight has trended up over the past 2 years  Chronic cough Chronic sinus congestion, suggested he try Zyrtec in addition to  Flonase nasal spray Also suggested he could try omeprazole 20 mg daily for 1 month to see if this makes any difference   Total encounter time more than 25 minutes  Greater than 50% was spent in counseling and coordination of care with the patient    Orders Placed This Encounter  Procedures  . EKG 12-Lead     Signed, Esmond Plants, M.D., Ph.D. 10/12/2020  [place], [place]

## 2020-10-12 ENCOUNTER — Ambulatory Visit: Payer: PPO | Admitting: Cardiovascular Disease

## 2020-10-12 ENCOUNTER — Encounter: Payer: Self-pay | Admitting: Cardiovascular Disease

## 2020-10-12 ENCOUNTER — Other Ambulatory Visit: Payer: Self-pay

## 2020-10-12 VITALS — BP 128/70 | HR 82 | Ht 69.0 in | Wt 186.0 lb

## 2020-10-12 DIAGNOSIS — I25118 Atherosclerotic heart disease of native coronary artery with other forms of angina pectoris: Secondary | ICD-10-CM | POA: Diagnosis not present

## 2020-10-12 DIAGNOSIS — I255 Ischemic cardiomyopathy: Secondary | ICD-10-CM | POA: Diagnosis not present

## 2020-10-12 DIAGNOSIS — I480 Paroxysmal atrial fibrillation: Secondary | ICD-10-CM | POA: Diagnosis not present

## 2020-10-12 DIAGNOSIS — E119 Type 2 diabetes mellitus without complications: Secondary | ICD-10-CM

## 2020-10-12 DIAGNOSIS — E7800 Pure hypercholesterolemia, unspecified: Secondary | ICD-10-CM

## 2020-10-12 DIAGNOSIS — I10 Essential (primary) hypertension: Secondary | ICD-10-CM | POA: Diagnosis not present

## 2020-10-12 DIAGNOSIS — R0602 Shortness of breath: Secondary | ICD-10-CM | POA: Diagnosis not present

## 2020-10-12 MED ORDER — CARVEDILOL 12.5 MG PO TABS
12.5000 mg | ORAL_TABLET | Freq: Two times a day (BID) | ORAL | 3 refills | Status: DC
Start: 2020-10-12 — End: 2021-10-19

## 2020-10-12 MED ORDER — ATORVASTATIN CALCIUM 80 MG PO TABS
80.0000 mg | ORAL_TABLET | Freq: Every day | ORAL | 3 refills | Status: DC
Start: 2020-10-12 — End: 2021-10-19

## 2020-10-12 MED ORDER — TICAGRELOR 60 MG PO TABS
60.0000 mg | ORAL_TABLET | Freq: Two times a day (BID) | ORAL | 3 refills | Status: DC
Start: 2020-10-12 — End: 2021-10-19

## 2020-10-12 MED ORDER — LOSARTAN POTASSIUM 25 MG PO TABS
25.0000 mg | ORAL_TABLET | Freq: Every day | ORAL | 3 refills | Status: DC
Start: 2020-10-12 — End: 2020-11-06

## 2020-10-12 NOTE — Patient Instructions (Signed)
We will refill all cardiac meds, 90 days , to last a year  Medication Instructions:  No changes  If you need a refill on your cardiac medications before your next appointment, please call your pharmacy.    Lab work: No new labs needed   If you have labs (blood work) drawn today and your tests are completely normal, you will receive your results only by: Marland Kitchen MyChart Message (if you have MyChart) OR . A paper copy in the mail If you have any lab test that is abnormal or we need to change your treatment, we will call you to review the results.   Testing/Procedures: No new testing needed   Follow-Up: At [facility], you and your health needs are our priority.  As part of our continuing mission to provide you with exceptional heart care, we have created designated Provider Care Teams.  These Care Teams include your primary Cardiologist (physician) and Advanced Practice Providers (APPs -  Physician Assistants and Nurse Practitioners) who all work together to provide you with the care you need, when you need it.  . You will need a follow up appointment in 12 months  . Providers on your designated Care Team:   . Nicolasa Ducking, NP . Eula Listen, PA-C . Marisue Ivan, PA-C  Any Other Special Instructions Will Be Listed Below (If Applicable).  COVID-19 Vaccine Information can be found at: [email or web address] For questions related to vaccine distribution or appointments, please email [email or web address] or call [phone].

## 2020-11-06 ENCOUNTER — Other Ambulatory Visit: Payer: Self-pay | Admitting: Cardiovascular Disease

## 2020-11-06 MED ORDER — VALSARTAN 40 MG PO TABS: 40.0000 mg | ORAL_TABLET | Freq: Every day | ORAL | 3 refills | Status: DC

## 2020-11-23 DIAGNOSIS — H2512 Age-related nuclear cataract, left eye: Secondary | ICD-10-CM | POA: Diagnosis not present

## 2021-02-06 IMAGING — DX RIGHT KNEE - 1-2 VIEW
2 series · 2 of 2 positions shown · non-contrast
Comparison: None.

CLINICAL DATA: Postop right knee replacement.

EXAM:
RIGHT KNEE - 1-2 VIEW

[knee ap]
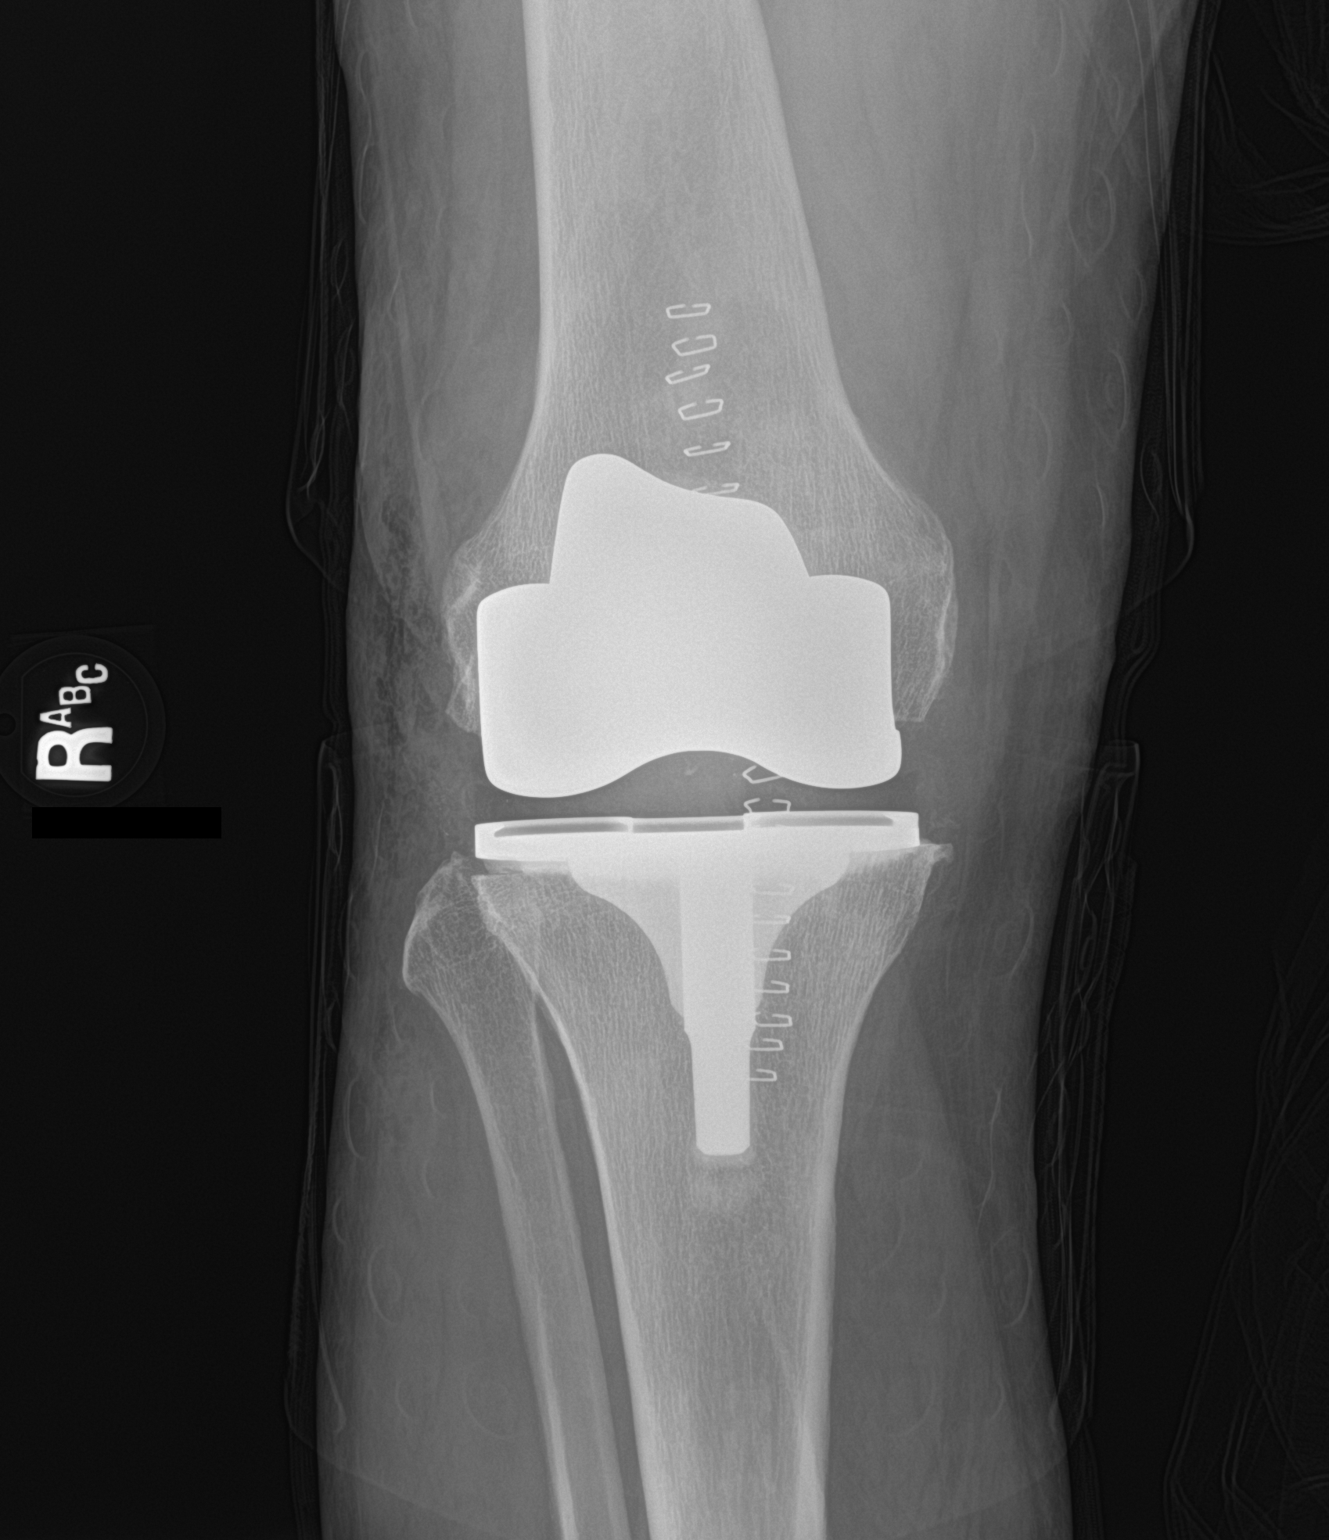

[knee lat]
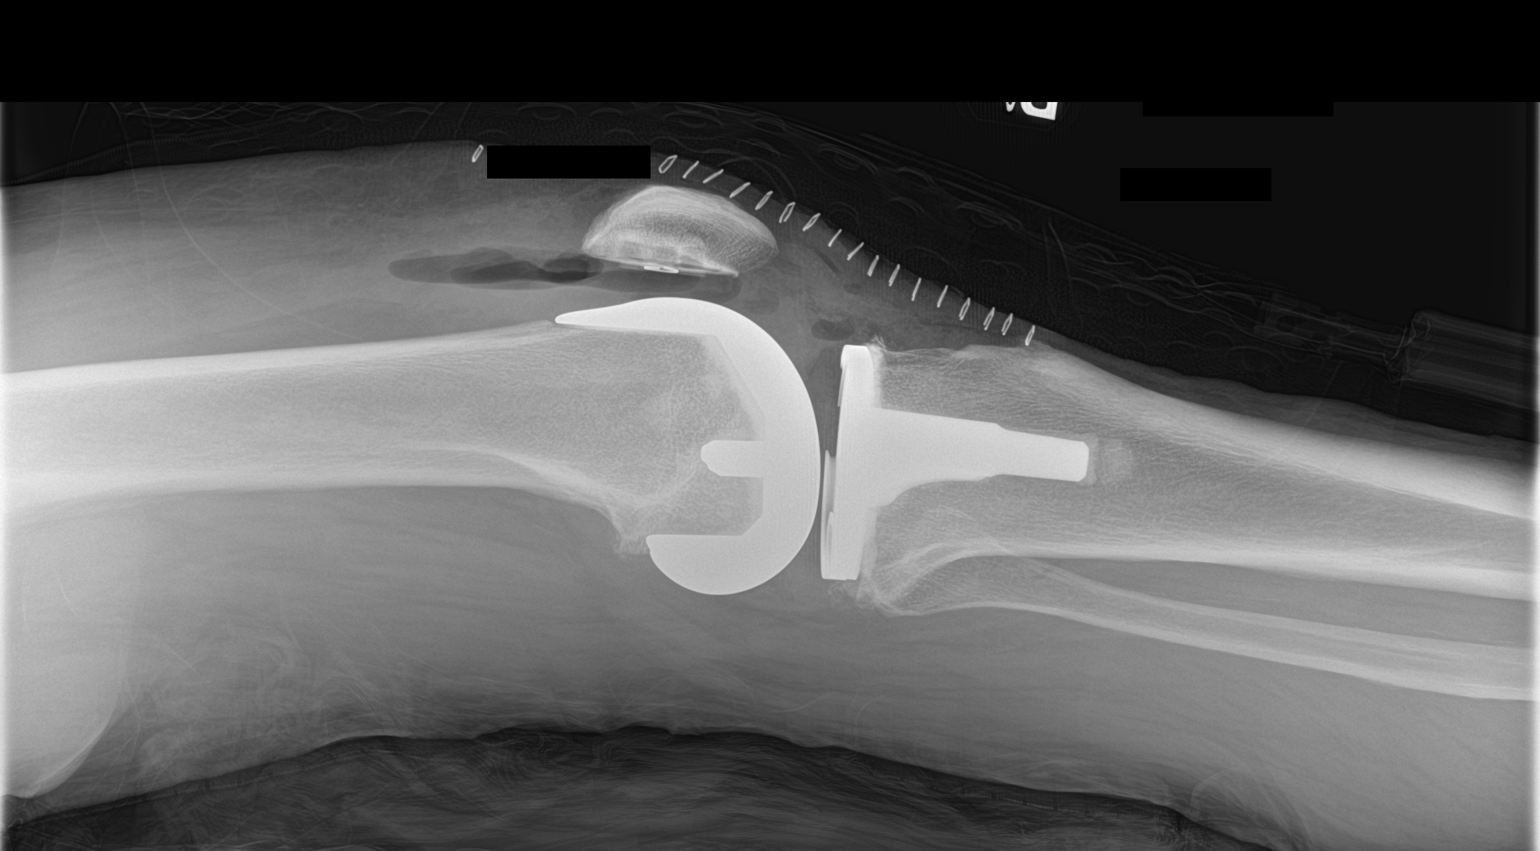

[2 of 2 positions shown; findings below may reference images not displayed]

FINDINGS: Exam demonstrates evidence of patient's right total knee
arthroplasty with prosthetic components normally located and intact.
Air and fluid within the knee joint with skin staples vertically
over the soft tissues of the anterior knee. Remainder of the exam is
unremarkable.
IMPRESSION: Expected changes post right total knee arthroplasty.

## 2021-02-09 ENCOUNTER — Ambulatory Visit (INDEPENDENT_AMBULATORY_CARE_PROVIDER_SITE_OTHER): Payer: PPO | Admitting: Family Medicine

## 2021-02-09 ENCOUNTER — Telehealth: Payer: Self-pay | Admitting: Family Medicine

## 2021-02-09 ENCOUNTER — Encounter: Payer: Self-pay | Admitting: Family Medicine

## 2021-02-09 ENCOUNTER — Other Ambulatory Visit: Payer: Self-pay | Admitting: Family Medicine

## 2021-02-09 ENCOUNTER — Other Ambulatory Visit: Payer: Self-pay

## 2021-02-09 VITALS — BP 133/70 | HR 73 | Temp 98.4°F | Ht 69.0 in | Wt 187.0 lb

## 2021-02-09 DIAGNOSIS — I10 Essential (primary) hypertension: Secondary | ICD-10-CM | POA: Diagnosis not present

## 2021-02-09 DIAGNOSIS — E1169 Type 2 diabetes mellitus with other specified complication: Secondary | ICD-10-CM | POA: Diagnosis not present

## 2021-02-09 DIAGNOSIS — E785 Hyperlipidemia, unspecified: Secondary | ICD-10-CM

## 2021-02-09 DIAGNOSIS — R350 Frequency of micturition: Secondary | ICD-10-CM

## 2021-02-09 DIAGNOSIS — Z0000 Encounter for general adult medical examination without abnormal findings: Secondary | ICD-10-CM

## 2021-02-09 DIAGNOSIS — I480 Paroxysmal atrial fibrillation: Secondary | ICD-10-CM

## 2021-02-09 DIAGNOSIS — N401 Enlarged prostate with lower urinary tract symptoms: Secondary | ICD-10-CM

## 2021-02-09 LAB — POCT GLYCOSYLATED HEMOGLOBIN (HGB A1C): Hemoglobin A1C: 6.7 % — AB (ref 4.0–5.6)

## 2021-02-09 MED ORDER — TAMSULOSIN HCL 0.4 MG PO CAPS: 0.4000 mg | ORAL_CAPSULE | Freq: Every day | ORAL | 3 refills | Status: DC

## 2021-02-09 NOTE — Assessment & Plan Note (Signed)
Limited improvement LUTS w/ higher dose Tamsulosin. - Last PSA 0.4 > 0.6 > 0.64 (07/2020) - brother has prostate CA - Last DRE mild enlarged prostate, unremarkable (07/10/18)  Plan: 1. Reduce Tamsulosin back to 0.4mg  daily, since no improvement on double x 2 dose = 0.8mg  - offered to reconsider Rapaflo/silodosin has been on in the past. Otherwise we discussed next option with Finasteride for reducing size of prostate, however he is hesitant to start new med and cautious about the PSA lowering effect. - Offered Urology consultation/referral if when ready, reconsider

## 2021-02-09 NOTE — Progress Notes (Signed)
Subjective:    Patient ID: Barry [name], male    DOB: 1945-11-07, 75 y.o.   MRN: [id number]  Barry [name] is a 75 y.o. male presenting on 02/09/2021 for Diabetes, Hypertension, and Benign Prostatic Hypertrophy   HPI   CHRONIC DM, Type 2: Due for A1c today . Previous A1c 6.4 to 6.7 Not checking CBG at home Meds:None - diet controlled Currentlyon Losartan Lifestyle: - Weight stable to plus 1 in 6 months - Diet (improved DM diet) - Exercise (active Denies hypoglycemia  CHRONIC HTN: Reportsno new concerns. Home BP readingsnormal controlled, similar to before Current Meds -Carvedilol12.5mg  BID, Losartan 25mg  daily Reports good compliance, took meds today. Tolerating well, w/o complaints.  CAD, history of STEMI / PAF / HLD Followed by Cardiology,next visit Dr Rockey Situ 09/2020 On Aspirin 81, Brilinta, Carvedilol, Atorvastatin Denies any chest pain, dyspnea, anginal symptoms, palpitations  S/p R knee TKR History ofRight KneePain, Arthritis S/p surgery 04/2019  BPH with LUTS/ Prostate Cancer Screening Last PSA 0.64 07/2020 - He is doing well on Flomax 0.4mg  daily, he does admit some occasional postural dizziness - Last visit we increased Flomax from 0.4 x 1 to x 2 pills for 0.8mg  dosing, he has stated now no improvement wants to reduce to 1 pill a day again. Never on Finasteride but not interested today. Admits some worse dribbling and LUTS - Prior treatments including Rapaflo, Silodosin Denies hematuria, dysuria, abdominal pain or flank pain   Depression screen [facility] 2/9 02/09/2021 09/08/2020 08/11/2020  Decreased Interest 0 0 0  Down, Depressed, Hopeless 0 0 0  PHQ - 2 Score 0 0 0  Altered sleeping 0 - -  Tired, decreased energy 1 - -  Change in appetite 0 - -  Feeling bad or failure about yourself  0 - -  Trouble concentrating 1 - -  Moving slowly or fidgety/restless 0 - -  Suicidal thoughts 0 - -  PHQ-9 Score 2 - -  Difficult doing  work/chores Not difficult at all - -    Social History   Tobacco Use  . Smoking status: Former Smoker    Types: Cigars    Quit date: 05/07/1974    Years since quitting: 46.7  . Smokeless tobacco: Former Systems developer    Types: [place] date: 10/17/1993  Vaping Use  . Vaping Use: Never used  Substance Use Topics  . Alcohol use: No  . Drug use: No    Review of Systems Per HPI unless specifically indicated above     Objective:    BP 133/70   Pulse 73   Temp 98.4 F (36.9 C) (Oral)   Ht 5\' 9"  (1.753 m)   Wt 187 lb (84.8 kg)   SpO2 98%   BMI 27.62 kg/m   Wt Readings from Last 3 Encounters:  02/09/21 187 lb (84.8 kg)  10/12/20 186 lb (84.4 kg)  09/08/20 184 lb (83.5 kg)    Physical Exam Vitals and nursing note reviewed.  Constitutional:      General: He is not in acute distress.    Appearance: He is well-developed. He is not diaphoretic.     Comments: Well-appearing, comfortable, cooperative  HENT:     Head: Normocephalic and atraumatic.  Eyes:     General:        Right eye: No discharge.        Left eye: No discharge.     Conjunctiva/sclera: Conjunctivae normal.  Neck:     Thyroid: No  thyromegaly.  Cardiovascular:     Rate and Rhythm: Normal rate and regular rhythm.     Heart sounds: Normal heart sounds. No murmur heard.   Pulmonary:     Effort: Pulmonary effort is normal. No respiratory distress.     Breath sounds: Normal breath sounds. No wheezing or rales.  Musculoskeletal:        General: Normal range of motion.     Cervical back: Normal range of motion and neck supple.  Lymphadenopathy:     Cervical: No cervical adenopathy.  Skin:    General: Skin is warm and dry.     Findings: No erythema or rash.  Neurological:     Mental Status: He is alert and oriented to person, place, and time.  Psychiatric:        Behavior: Behavior normal.     Comments: Well groomed, good eye contact, normal speech and thoughts      Recent Labs    08/04/20 0821  02/09/21 0934  HGBA1C 6.7* 6.7*     Results for orders placed or performed in visit on 02/09/21  POCT glycosylated hemoglobin (Hb A1C)  Result Value Ref Range   Hemoglobin A1C 6.7 (A) 4.0 - 5.6 %      Assessment & Plan:   Problem List Items Addressed This Visit    Type 2 diabetes mellitus with other specified complication ([place]) - Primary    Well-controlled DM with A1c 6.7 at goal, stable No known complications or hypoglycemia. Complications - other including hyperlipidemia and vascular disease CAD, GERD - increases risk of future cardiovascular complications   Plan 1. No DM medication required. Diet controlled 2. Encourage improved lifestyle - low carb, low sugar diet, reduce portion size, continue improving regular exercise 3. Check CBG , bring log to next visit for review 4. Continue ASA, ARB, Statin  Yearly exam w/ [facility]      Relevant Orders   POCT glycosylated hemoglobin (Hb A1C) (Completed)   Essential (primary) hypertension    Controlled HTN No known complications  Plan: 1. Continue Carvedilol 12.5mg  BID -  indication with s/p MI - Continue on Losartan 25mg  daily - Encouraged low sodium diet, regular exercise - Monitor BP at home      Relevant Medications   atenolol (TENORMIN) 50 MG tablet   Benign prostatic hyperplasia with lower urinary tract symptoms    Limited improvement LUTS w/ higher dose Tamsulosin. - Last PSA 0.4 > 0.6 > 0.64 (07/2020) - brother has prostate CA - Last DRE mild enlarged prostate, unremarkable (07/10/18)  Plan: 1. Reduce Tamsulosin back to 0.4mg  daily, since no improvement on double x 2 dose = 0.8mg  - offered to reconsider Rapaflo/silodosin has been on in the past. Otherwise we discussed next option with Finasteride for reducing size of prostate, however he is hesitant to start new med and cautious about the PSA lowering effect. - Offered Urology consultation/referral if when ready, reconsider      Relevant Medications    tamsulosin (FLOMAX) 0.4 MG CAPS capsule      Meds ordered this encounter  Medications  . tamsulosin (FLOMAX) 0.4 MG CAPS capsule    Sig: Take 1 capsule (0.4 mg total) by mouth daily.    Dispense:  90 capsule    Refill:  3    Change back to once daily instead of 2 pills. Thanks     Follow up plan: Return in about 6 months (around 08/11/2021) for 6 month fasting lab only then 1 week later  Annual Physical.  Future labs ordered for 08/09/21  Nobie Putnam, [facility] 02/09/2021, 9:33 AM

## 2021-02-09 NOTE — Assessment & Plan Note (Signed)
Controlled HTN No known complications  Plan: 1. Continue Carvedilol 12.5mg  BID -  indication with s/p MI - Continue on Losartan 25mg  daily - Encouraged low sodium diet, regular exercise - Monitor BP at home

## 2021-02-09 NOTE — Patient Instructions (Addendum)
Thank you for coming to the office today.  Keep on current medications.  If you are interested in ADDING the Finasteride prostate pill that can shrink the prostate and it can lower PSA artificially, or we can refer you to Urologist.   DUE for FASTING BLOOD WORK (no food or drink after midnight before the lab appointment, only water or coffee without cream/sugar on the morning of)  SCHEDULE "Lab Only" visit in the morning at the clinic for lab draw in 6 MONTHS   - Make sure Lab Only appointment is at about 1 week before your next appointment, so that results will be available  For Lab Results, once available within 2-3 days of blood draw, you can can log in to MyChart online to view your results and a brief explanation. Also, we can discuss results at next follow-up visit.   Please schedule a Follow-up Appointment to: Return in about 6 months (around 08/11/2021) for 6 month fasting lab only then 1 week later Annual Physical.  If you have any other questions or concerns, please feel free to call the office or send a message through [place]. You may also schedule an earlier appointment if necessary.  Additionally, you may be receiving a survey about your experience at our office within a few days to 1 week by e-mail or mail. We value your feedback.  Nobie Putnam, DO [place]

## 2021-02-09 NOTE — Telephone Encounter (Signed)
We received fax from [facility] stating that his Eye doctor Dr George Ina was not aware that [name] has Diabetes.  We need a copy of his Diabetic Eye Exam. It looks like they should be able to do this on 11/25/2021 if patient reports Diabetes as a medical condition to his eye doctor at that visit.  Please contact the patient and remind him to tell his Eye Doctor that he is a diabetic at the next visit, need to get the record at next visit  Barry [name], [facility] 02/09/2021, 5:24 PM

## 2021-02-09 NOTE — Assessment & Plan Note (Signed)
Well-controlled DM with A1c 6.7 at goal, stable No known complications or hypoglycemia. Complications - other including hyperlipidemia and vascular disease CAD, GERD - increases risk of future cardiovascular complications   Plan 1. No DM medication required. Diet controlled 2. Encourage improved lifestyle - low carb, low sugar diet, reduce portion size, continue improving regular exercise 3. Check CBG , bring log to next visit for review 4. Continue ASA, ARB, Statin  Yearly exam w/ [place]

## 2021-02-10 NOTE — Telephone Encounter (Signed)
Patient informed of this. Called and told him he needs to let his eye doctor know about his A1C so they can do a diabetic eye exam. He verbalized understanding of this.

## 2021-05-11 DIAGNOSIS — Z96651 Presence of right artificial knee joint: Secondary | ICD-10-CM | POA: Diagnosis not present

## 2021-07-28 DIAGNOSIS — D2262 Melanocytic nevi of left upper limb, including shoulder: Secondary | ICD-10-CM | POA: Diagnosis not present

## 2021-07-28 DIAGNOSIS — L718 Other rosacea: Secondary | ICD-10-CM | POA: Diagnosis not present

## 2021-07-28 DIAGNOSIS — L570 Actinic keratosis: Secondary | ICD-10-CM | POA: Diagnosis not present

## 2021-07-28 DIAGNOSIS — L821 Other seborrheic keratosis: Secondary | ICD-10-CM | POA: Diagnosis not present

## 2021-07-28 DIAGNOSIS — D2272 Melanocytic nevi of left lower limb, including hip: Secondary | ICD-10-CM | POA: Diagnosis not present

## 2021-07-28 DIAGNOSIS — D2271 Melanocytic nevi of right lower limb, including hip: Secondary | ICD-10-CM | POA: Diagnosis not present

## 2021-07-28 DIAGNOSIS — X32XXXA Exposure to sunlight, initial encounter: Secondary | ICD-10-CM | POA: Diagnosis not present

## 2021-07-28 DIAGNOSIS — D225 Melanocytic nevi of trunk: Secondary | ICD-10-CM | POA: Diagnosis not present

## 2021-07-28 DIAGNOSIS — D2261 Melanocytic nevi of right upper limb, including shoulder: Secondary | ICD-10-CM | POA: Diagnosis not present

## 2021-08-09 ENCOUNTER — Other Ambulatory Visit: Payer: PPO

## 2021-08-12 ENCOUNTER — Other Ambulatory Visit: Payer: Self-pay

## 2021-08-12 DIAGNOSIS — E785 Hyperlipidemia, unspecified: Secondary | ICD-10-CM

## 2021-08-12 DIAGNOSIS — E1169 Type 2 diabetes mellitus with other specified complication: Secondary | ICD-10-CM

## 2021-08-12 DIAGNOSIS — I480 Paroxysmal atrial fibrillation: Secondary | ICD-10-CM

## 2021-08-12 DIAGNOSIS — Z0000 Encounter for general adult medical examination without abnormal findings: Secondary | ICD-10-CM

## 2021-08-12 DIAGNOSIS — I10 Essential (primary) hypertension: Secondary | ICD-10-CM

## 2021-08-12 DIAGNOSIS — N401 Enlarged prostate with lower urinary tract symptoms: Secondary | ICD-10-CM

## 2021-08-13 ENCOUNTER — Encounter: Payer: PPO | Admitting: Family Medicine

## 2021-08-16 ENCOUNTER — Other Ambulatory Visit: Payer: Self-pay

## 2021-08-16 ENCOUNTER — Other Ambulatory Visit: Payer: PPO

## 2021-08-16 DIAGNOSIS — R350 Frequency of micturition: Secondary | ICD-10-CM | POA: Diagnosis not present

## 2021-08-16 DIAGNOSIS — Z0000 Encounter for general adult medical examination without abnormal findings: Secondary | ICD-10-CM | POA: Diagnosis not present

## 2021-08-16 DIAGNOSIS — I480 Paroxysmal atrial fibrillation: Secondary | ICD-10-CM | POA: Diagnosis not present

## 2021-08-16 DIAGNOSIS — E785 Hyperlipidemia, unspecified: Secondary | ICD-10-CM | POA: Diagnosis not present

## 2021-08-16 DIAGNOSIS — I10 Essential (primary) hypertension: Secondary | ICD-10-CM | POA: Diagnosis not present

## 2021-08-16 DIAGNOSIS — N401 Enlarged prostate with lower urinary tract symptoms: Secondary | ICD-10-CM | POA: Diagnosis not present

## 2021-08-16 DIAGNOSIS — E1169 Type 2 diabetes mellitus with other specified complication: Secondary | ICD-10-CM | POA: Diagnosis not present

## 2021-08-17 LAB — CBC WITH DIFFERENTIAL/PLATELET
Absolute Monocytes: 616 cells/uL (ref 200–950)
Basophils Absolute: 47 cells/uL (ref 0–200)
Basophils Relative: 0.6 %
Eosinophils Absolute: 250 cells/uL (ref 15–500)
Eosinophils Relative: 3.2 %
HCT: 40.3 % (ref 38.5–50.0)
Hemoglobin: 13.1 g/dL — ABNORMAL LOW (ref 13.2–17.1)
Lymphs Abs: 1537 cells/uL (ref 850–3900)
MCH: 30 pg (ref 27.0–33.0)
MCHC: 32.5 g/dL (ref 32.0–36.0)
MCV: 92.4 fL (ref 80.0–100.0)
MPV: 9.6 fL (ref 7.5–12.5)
Monocytes Relative: 7.9 %
Neutro Abs: 5351 cells/uL (ref 1500–7800)
Neutrophils Relative %: 68.6 %
Platelets: 211 10*3/uL (ref 140–400)
RBC: 4.36 10*6/uL (ref 4.20–5.80)
RDW: 12.3 % (ref 11.0–15.0)
Total Lymphocyte: 19.7 %
WBC: 7.8 10*3/uL (ref 3.8–10.8)

## 2021-08-17 LAB — COMPLETE METABOLIC PANEL WITH GFR
AG Ratio: 2.2 (calc) (ref 1.0–2.5)
ALT: 15 U/L (ref 9–46)
AST: 17 U/L (ref 10–35)
Albumin: 4.3 g/dL (ref 3.6–5.1)
Alkaline phosphatase (APISO): 89 U/L (ref 35–144)
BUN: 13 mg/dL (ref 7–25)
CO2: 26 mmol/L (ref 20–32)
Calcium: 8.8 mg/dL (ref 8.6–10.3)
Chloride: 106 mmol/L (ref 98–110)
Creat: 0.92 mg/dL (ref 0.70–1.28)
Globulin: 2 g/dL (calc) (ref 1.9–3.7)
Glucose, Bld: 134 mg/dL — ABNORMAL HIGH (ref 65–99)
Potassium: 4.3 mmol/L (ref 3.5–5.3)
Sodium: 141 mmol/L (ref 135–146)
Total Bilirubin: 0.7 mg/dL (ref 0.2–1.2)
Total Protein: 6.3 g/dL (ref 6.1–8.1)
eGFR: 87 mL/min/{1.73_m2} (ref >=60)

## 2021-08-17 LAB — LIPID PANEL
Cholesterol: 104 mg/dL (ref <200)
HDL: 37 mg/dL — ABNORMAL LOW (ref >=40)
LDL Cholesterol (Calc): 50 mg/dL (calc)
Non-HDL Cholesterol (Calc): 67 mg/dL (calc) (ref <130)
Total CHOL/HDL Ratio: 2.8 (calc) (ref <5.0)
Triglycerides: 89 mg/dL (ref <150)

## 2021-08-17 LAB — HEMOGLOBIN A1C
Hgb A1c MFr Bld: 6.7 % of total Hgb — ABNORMAL HIGH (ref <5.7)
Mean Plasma Glucose: 146 mg/dL
eAG (mmol/L): 8.1 mmol/L

## 2021-08-17 LAB — PSA: PSA: 1.05 ng/mL (ref <=4.00)

## 2021-08-17 LAB — TSH: TSH: 1.23 mIU/L (ref 0.40–4.50)

## 2021-08-20 ENCOUNTER — Encounter: Payer: Self-pay | Admitting: Family Medicine

## 2021-08-20 ENCOUNTER — Other Ambulatory Visit: Payer: Self-pay

## 2021-08-20 ENCOUNTER — Other Ambulatory Visit: Payer: Self-pay | Admitting: Family Medicine

## 2021-08-20 ENCOUNTER — Ambulatory Visit (INDEPENDENT_AMBULATORY_CARE_PROVIDER_SITE_OTHER): Payer: PPO | Admitting: Family Medicine

## 2021-08-20 VITALS — BP 129/72 | HR 69 | Ht 69.0 in | Wt 182.8 lb

## 2021-08-20 DIAGNOSIS — E785 Hyperlipidemia, unspecified: Secondary | ICD-10-CM | POA: Diagnosis not present

## 2021-08-20 DIAGNOSIS — I480 Paroxysmal atrial fibrillation: Secondary | ICD-10-CM

## 2021-08-20 DIAGNOSIS — I10 Essential (primary) hypertension: Secondary | ICD-10-CM

## 2021-08-20 DIAGNOSIS — R350 Frequency of micturition: Secondary | ICD-10-CM

## 2021-08-20 DIAGNOSIS — Z0000 Encounter for general adult medical examination without abnormal findings: Secondary | ICD-10-CM | POA: Diagnosis not present

## 2021-08-20 DIAGNOSIS — E1169 Type 2 diabetes mellitus with other specified complication: Secondary | ICD-10-CM

## 2021-08-20 DIAGNOSIS — N401 Enlarged prostate with lower urinary tract symptoms: Secondary | ICD-10-CM | POA: Diagnosis not present

## 2021-08-20 DIAGNOSIS — J019 Acute sinusitis, unspecified: Secondary | ICD-10-CM | POA: Diagnosis not present

## 2021-08-20 MED ORDER — FLUTICASONE PROPIONATE 50 MCG/ACT NA SUSP: 2.0000 | Freq: Every day | NASAL | 3 refills | Status: DC

## 2021-08-20 NOTE — Assessment & Plan Note (Signed)
Controlled HTN No known complications  Plan: 1. Continue Carvedilol 12.5mg  BID -  indication with s/p MI - Continue on Losartan 25mg  daily - Encouraged low sodium diet, regular exercise - Monitor BP at home

## 2021-08-20 NOTE — Assessment & Plan Note (Signed)
Limited improvement LUTS w/ higher dose Tamsulosin. - Last PSA 0.4 > 0.6 > 0.64 (07/2020) Now up to 1.05 (10/22) Fam hx brother has prostate CA - Last DRE mild enlarged prostate, unremarkable (07/10/18)  Plan: 1. Continue tamsulosin 0.4mg  daily consider Finasteride, he declines we discussed risk of impacting PSA and he has fam history prostate CA brother. - Offered Urology consultation/referral if when ready, reconsider

## 2021-08-20 NOTE — Progress Notes (Signed)
Subjective:    Patient ID: Barry [name], male    DOB: [birth year], 75 y.o.   MRN: [id number]  Barry [name] is a 75 y.o. male presenting on 08/20/2021 for Annual Exam   HPI  Here for Annual Physical and Lab Review.  CHRONIC DM, Type 2: A1c 6.7 stble Not checking CBG at home Meds: None - diet controlled Currently on Losartan Lifestyle: - Weight down 5 lbs - Diet (improved DM diet) - Exercise (active Denies hypoglycemia   CHRONIC HTN: Reports no new concerns. Home BP readings normal controlled, similar to before Current Meds - Carvedilol 12.93m BID, Losartan 226mdaily   Reports good compliance, took meds today. Tolerating well, w/o complaints.   CAD, history of STEMI / PAF / HLD Followed by Cardiology, next visit Barry [name] On Aspirin 81, Brilinta, Carvedilol, Atorvastatin Denies any chest pain, dyspnea, anginal symptoms, palpitations   S/p R knee TKR History of Right Knee Pain, Arthritis S/p surgery 04/2019   BPH with LUTS / Prostate Cancer Screening Last PSA 1.05 07/2021, (previous 0.62 1 year ago) - He is doing well on Flomax 0.61m73maily but tried x 2 dose in past without change Never on Finasteride but not interested today. Admits some worse dribbling and LUTS - Prior treatments including Rapaflo, Silodosin Denies hematuria, dysuria, abdominal pain or flank pain   Health Maintenance:  UPdated Flu Shot already 10/5 Declines COVID booster  [facility] 08/20/2021 02/09/2021 09/08/2020  Decreased Interest 0 0 0  Down, Depressed, Hopeless 0 0 0  PHQ - 2 Score 0 0 0  Altered sleeping 0 0 -  Tired, decreased energy 0 1 -  Change in appetite 0 0 -  Feeling bad or failure about yourself  0 0 -  Trouble concentrating 0 1 -  Moving slowly or fidgety/restless 1 0 -  Suicidal thoughts 0 0 -  PHQ-9 Score 1 2 -  Difficult doing work/chores Not difficult at all Not difficult at all -  Some recent data might be hidden    Past  Medical History:  Diagnosis Date   Arthritis    BPH (benign prostatic hypertrophy)    Coronary artery disease    Dyspnea    d/t heart problem.  worsens in hot or cold extremes   GERD (gastroesophageal reflux disease)    Hypertension    [facility]   Past Surgical History:  Procedure Laterality Date   CATARACT EXTRACTION W/PHACO Right 04/12/2016   Procedure: CATARACT EXTRACTION PHACO AND INTRAOCULAR LENS PLACEMENT ([place]geon: Barry [name];  Location: [facility];  Service: Ophthalmology;  Laterality: Right;  [place]1:05AP% 26.6CDE [id number] pack lot # [id number] CORONARY/GRAFT ACUTE MI REVASCULARIZATION N/A 01/16/2018   Procedure: Coronary/Graft Acute MI Revascularization;  Surgeon: Barry [name];  [facility];  Service: Cardiovascular;  Laterality: N/A;   EYE SURGERY     KNEE SURGERY Right 09/21/2012   arthroscopy   LEFT HEART CATH AND CORONARY ANGIOGRAPHY N/A 01/16/2018   Procedure: LEFT HEART CATH AND CORONARY ANGIOGRAPHY;  Surgeon: Barry [name];  [facility];  Service: Cardiovascular;  Laterality: N/A;   TOTAL KNEE ARTHROPLASTY Right 05/15/2019   Procedure: TOTAL KNEE ARTHROPLASTY;  Surgeon: Barry [name];  Location: [facility] ORS;  Service: Orthopedics;  Laterality: Right;   Social History   Socioeconomic History   Marital status: Married    Spouse name: Barry [name]  Number of children: Not on file   Years of education: 12th grade   Highest education level: High school graduate  Occupational History   Occupation: retired  Tobacco Use   Smoking status: Former    Types: Cigars    Quit date: 05/07/1974    Years since quitting: 47.3   Smokeless tobacco: Former    Types: Chew    Quit date: 10/17/1993  Vaping Use   Vaping Use: Never used  Substance and Sexual Activity   Alcohol use: No   Drug use: No   Sexual activity: Not on file  Other Topics Concern   Not on file   Social History Narrative   Not on file   Social Determinants of Health   Financial Resource Strain: Low Risk    Difficulty of Paying Living Expenses: Not hard at all  Food Insecurity: No Food Insecurity   Worried About Charity fundraiser in the Last Year: Never true   [place] in the Last Year: Never true  Transportation Needs: No Transportation Needs   Lack of Transportation (Medical): No   Lack of Transportation (Non-Medical): No  Physical Activity: Inactive   Days of Exercise per Week: 0 days   Minutes of Exercise per Session: 0 min  Stress: No Stress Concern Present   Feeling of Stress : Not at all  Social Connections: Not on file  Intimate Partner Violence: Not on file   Family History  Problem Relation Age of Onset   Diabetes Mother    Stroke Mother    Prostate cancer Brother    Melanoma Brother    Hyperlipidemia Paternal Aunt    Current Outpatient Medications on File Prior to Visit  Medication Sig   aspirin 81 MG tablet Take 1 tablet (81 mg total) by mouth daily.   atenolol (TENORMIN) 50 MG tablet Take 1 tablet by mouth daily.   atorvastatin (LIPITOR) 80 MG tablet Take 1 tablet (80 mg total) by mouth daily at 6 PM.   carvedilol (COREG) 12.5 MG tablet Take 1 tablet (12.5 mg total) by mouth 2 (two) times daily.   Famotidine (PEPCID AC PO) Take 1 tablet by mouth as needed.   tamsulosin (FLOMAX) 0.4 MG CAPS capsule Take 1 capsule (0.4 mg total) by mouth daily.   ticagrelor (BRILINTA) 60 MG TABS tablet Take 1 tablet (60 mg total) by mouth 2 (two) times daily.   valsartan (DIOVAN) 40 MG tablet Take 1 tablet (40 mg total) by mouth daily.   No current facility-administered medications on file prior to visit.    Review of Systems  Constitutional:  Negative for activity change, appetite change, chills, diaphoresis, fatigue and fever.  HENT:  Negative for congestion and hearing loss.   Eyes:  Negative for visual disturbance.  Respiratory:  Negative for cough,  chest tightness, shortness of breath and wheezing.   Cardiovascular:  Negative for chest pain, palpitations and leg swelling.  Gastrointestinal:  Negative for abdominal pain, constipation, diarrhea, nausea and vomiting.  Genitourinary:  Negative for dysuria, frequency and hematuria.  Musculoskeletal:  Negative for arthralgias and neck pain.  Skin:  Negative for rash.  Neurological:  Negative for dizziness, weakness, light-headedness, numbness and headaches.  Hematological:  Negative for adenopathy.  Psychiatric/Behavioral:  Negative for behavioral problems, dysphoric mood and sleep disturbance.   Per HPI unless specifically indicated above      Objective:    BP 129/72   Pulse 69   Ht _0  (1.753 m)  Wt 182 lb 12.8 oz (82.9 kg)   SpO2 97%   BMI 26.99 kg/m   Wt Readings from Last 3 Encounters:  08/20/21 182 lb 12.8 oz (82.9 kg)  02/09/21 187 lb (84.8 kg)  10/12/20 186 lb (84.4 kg)    Physical Exam Vitals and nursing note reviewed.  Constitutional:      General: He is not in acute distress.    Appearance: He is well-developed. He is not diaphoretic.     Comments: Well-appearing, comfortable, cooperative  HENT:     Head: Normocephalic and atraumatic.  Eyes:     General:        Right eye: No discharge.        Left eye: No discharge.     Conjunctiva/sclera: Conjunctivae normal.     Pupils: Pupils are equal, round, and reactive to light.  Neck:     Thyroid: No thyromegaly.     Vascular: No carotid bruit.  Cardiovascular:     Rate and Rhythm: Normal rate and regular rhythm.     Pulses: Normal pulses.     Heart sounds: Normal heart sounds. No murmur heard. Pulmonary:     Effort: Pulmonary effort is normal. No respiratory distress.     Breath sounds: Normal breath sounds. No wheezing or rales.  Abdominal:     General: Bowel sounds are normal. There is no distension.     Palpations: Abdomen is soft. There is no mass.     Tenderness: There is no abdominal tenderness.   Musculoskeletal:        General: No tenderness. Normal range of motion.     Cervical back: Normal range of motion and neck supple.     Right lower leg: No edema.     Left lower leg: No edema.     Comments: Upper / Lower Extremities: - Normal muscle tone, strength bilateral upper extremities 5/5, lower extremities 5/5  Lymphadenopathy:     Cervical: No cervical adenopathy.  Skin:    General: Skin is warm and dry.     Findings: No erythema or rash.  Neurological:     Mental Status: He is alert and oriented to person, place, and time.     Comments: Distal sensation intact to light touch all extremities  Psychiatric:        Mood and Affect: Mood normal.        Behavior: Behavior normal.        Thought Content: Thought content normal.     Comments: Well groomed, good eye contact, normal speech and thoughts    Diabetic Foot Exam - Simple   Simple Foot Form Diabetic Foot exam was performed with the following findings: Yes 08/20/2021 11:18 AM  Visual Inspection No deformities, no ulcerations, no other skin breakdown bilaterally: Yes Sensation Testing Intact to touch and monofilament testing bilaterally: Yes Pulse Check Posterior Tibialis and Dorsalis pulse intact bilaterally: Yes Comments      Results for orders placed or performed in visit on 08/12/21  TSH  Result Value Ref Range   TSH 1.23 0.40 - 4.50 mIU/L  PSA  Result Value Ref Range   PSA 1.05 < OR = 4.00 ng/mL  Lipid panel  Result Value Ref Range   Cholesterol 104 <200 mg/dL   HDL 37 (L) > OR = 40 mg/dL   Triglycerides 89 <150 mg/dL   LDL Cholesterol (Calc) 50 mg/dL (calc)   Total CHOL/HDL Ratio 2.8 <5.0 (calc)   Non-HDL Cholesterol (Calc) 67 <130 mg/dL (calc)  COMPLETE METABOLIC PANEL WITH GFR  Result Value Ref Range   Glucose, Bld 134 (H) 65 - 99 mg/dL   BUN 13 7 - 25 mg/dL   Creat 0.92 0.70 - 1.28 mg/dL   eGFR 87 > OR = 60 mL/min/1.58m   BUN/Creatinine Ratio NOT APPLICABLE 6 - 22 (calc)   Sodium 141 135 -  146 mmol/L   Potassium 4.3 3.5 - 5.3 mmol/L   Chloride 106 98 - 110 mmol/L   CO2 26 20 - 32 mmol/L   Calcium 8.8 8.6 - 10.3 mg/dL   Total Protein 6.3 6.1 - 8.1 g/dL   Albumin 4.3 3.6 - 5.1 g/dL   Globulin 2.0 1.9 - 3.7 g/dL (calc)   AG Ratio 2.2 1.0 - 2.5 (calc)   Total Bilirubin 0.7 0.2 - 1.2 mg/dL   Alkaline phosphatase (APISO) 89 35 - 144 U/L   AST 17 10 - 35 U/L   ALT 15 9 - 46 U/L  CBC with Differential/Platelet  Result Value Ref Range   WBC 7.8 3.8 - 10.8 Thousand/uL   RBC 4.36 4.20 - 5.80 Million/uL   Hemoglobin 13.1 (L) 13.2 - 17.1 g/dL   HCT 40.3 38.5 - 50.0 %   MCV 92.4 80.0 - 100.0 fL   MCH 30.0 27.0 - 33.0 pg   MCHC 32.5 32.0 - 36.0 g/dL   RDW 12.3 11.0 - 15.0 %   Platelets 211 140 - 400 Thousand/uL   MPV 9.6 7.5 - 12.5 fL   Neutro Abs 5,351 1,500 - 7,800 cells/uL   Lymphs Abs 1,537 850 - 3,900 cells/uL   Absolute Monocytes 616 200 - 950 cells/uL   Eosinophils Absolute 250 15 - 500 cells/uL   Basophils Absolute 47 0 - 200 cells/uL   Neutrophils Relative % 68.6 %   Total Lymphocyte 19.7 %   Monocytes Relative 7.9 %   Eosinophils Relative 3.2 %   Basophils Relative 0.6 %  Hemoglobin A1c  Result Value Ref Range   Hgb A1c MFr Bld 6.7 (H) <5.7 % of total Hgb   Mean Plasma Glucose 146 mg/dL   eAG (mmol/L) 8.1 mmol/L      Assessment & Plan:   Problem List Items Addressed This Visit     Type 2 diabetes mellitus with other specified complication ([place]    Well-controlled DM with A1c 6.7 at goal, stable No known complications or hypoglycemia. Complications - other including hyperlipidemia and vascular disease CAD, GERD - increases risk of future cardiovascular complications   Plan 1. No DM medication required. Diet controlled 2. Encourage improved lifestyle - low carb, low sugar diet, reduce portion size, continue improving regular exercise 3. Check CBG , bring log to next visit for review 4. Continue ASA, ARB, Statin DM Foot  Yearly exam w/ [facility]       Paroxysmal atrial fibrillation (HCC)    Stable Followed by Cardiology On anti-platelet brilinta and aspirin - he can discuss with cardiology on dose adjust if needed On rate control      Hyperlipidemia associated with type 2 diabetes mellitus ([place]    Controlled cholesterol on statin and lifestyle Last lipid panel 07/2021 Known CAD  Plan: 1. Continue current meds - Atorvastating 829mdaily 2. Continue DAPT - Brilinta / ASA 8151mor primary ASCVD risk reduction 3. Encourage improved lifestyle - low carb/cholesterol, reduce portion size, continue improving regular exercise      Essential (primary) hypertension    Controlled HTN No known complications  Plan: 1. Continue  Carvedilol 12.6m BID -  indication with s/p MI - Continue on Losartan 211mdaily - Encouraged low sodium diet, regular exercise - Monitor BP at home      Benign prostatic hyperplasia with lower urinary tract symptoms    Limited improvement LUTS w/ higher dose Tamsulosin. - Last PSA 0.4 > 0.6 > 0.64 (07/2020) Now up to 1.05 (10/22) Fam hx brother has prostate CA - Last DRE mild enlarged prostate, unremarkable (07/10/18)  Plan: 1. Continue tamsulosin 0.43m31maily consider Finasteride, he declines we discussed risk of impacting PSA and he has fam history prostate CA brother. - Offered Urology consultation/referral if when ready, reconsider      Other Visit Diagnoses     Annual physical exam    -  Primary   Subacute rhinosinusitis       Relevant Medications   fluticasone (FLONASE) 50 MCG/ACT nasal spray       Updated Health Maintenance information Already had Flu Shot Consider COVID Booster Reviewed recent lab results with patient Encouraged improvement to lifestyle with diet and exercise Goal of weight loss   Meds ordered this encounter  Medications   fluticasone (FLONASE) 50 MCG/ACT nasal spray    Sig: Place 2 sprays into both nostrils daily. Use for 4-6 weeks then stop and use seasonally  or as needed.    Dispense:  16 g    Refill:  3    Follow up plan: Return in about 6 months (around 02/17/2022) for 6 month NON fasting lab then 1 week later DM, HTN, BPH updates.  Future lab 02/14/22 A1c + PSA 6 months  AleNobie PutnamO [place] 08/20/2021, 11:02 AM

## 2021-08-20 NOTE — Assessment & Plan Note (Signed)
Controlled cholesterol on statin and lifestyle Last lipid panel 07/2021 Known CAD  Plan: 1. Continue current meds - Atorvastating 80mg  daily 2. Continue DAPT - Brilinta / ASA 81mg  for primary ASCVD risk reduction 3. Encourage improved lifestyle - low carb/cholesterol, reduce portion size, continue improving regular exercise

## 2021-08-20 NOTE — Assessment & Plan Note (Signed)
Stable Followed by Cardiology On anti-platelet brilinta and aspirin - he can discuss with cardiology on dose adjust if needed On rate control

## 2021-08-20 NOTE — Assessment & Plan Note (Signed)
Well-controlled DM with A1c 6.7 at goal, stable No known complications or hypoglycemia. Complications - other including hyperlipidemia and vascular disease CAD, GERD - increases risk of future cardiovascular complications   Plan 1. No DM medication required. Diet controlled 2. Encourage improved lifestyle - low carb, low sugar diet, reduce portion size, continue improving regular exercise 3. Check CBG , bring log to next visit for review 4. Continue ASA, ARB, Statin DM Foot  Yearly exam w/ [facility] 

## 2021-08-20 NOTE — Patient Instructions (Addendum)
Thank you for coming to the office today.  Start Zyrtec OTC for allergies drainage - if this doesn't help can start nasal spray IF needed - Start nasal steroid Flonase 2 sprays in each nostril daily for 4-6 weeks, may repeat course seasonally or as needed  Lab results look great overall. Cholesterol and sugar controlled, kidney function normal.  PSA 1.05, mild elevated from 0.6 last time. Keep on Tamsulosin. If you decide to change med we would likely refer to Urologist for consultation.  DUE for NON FASTING BLOOD WORK   SCHEDULE "Lab Only" visit in the morning at the clinic for lab draw in 6 MONTHS   - Make sure Lab Only appointment is at about 1 week before your next appointment, so that results will be available  For Lab Results, once available within 2-3 days of blood draw, you can can log in to MyChart online to view your results and a brief explanation. Also, we can discuss results at next follow-up visit.   Please schedule a Follow-up Appointment to: Return in about 6 months (around 02/17/2022) for 6 month NON fasting lab then 1 week later DM, HTN, BPH updates.  If you have any other questions or concerns, please feel free to call the office or send a message through [place]. You may also schedule an earlier appointment if necessary.  Additionally, you may be receiving a survey about your experience at our office within a few days to 1 week by e-mail or mail. We value your feedback.  Nobie Putnam, DO [place]

## 2021-09-13 ENCOUNTER — Ambulatory Visit: Payer: PPO

## 2021-10-01 ENCOUNTER — Other Ambulatory Visit: Payer: Self-pay

## 2021-10-01 ENCOUNTER — Ambulatory Visit (INDEPENDENT_AMBULATORY_CARE_PROVIDER_SITE_OTHER): Payer: PPO

## 2021-10-01 VITALS — BP 104/66 | HR 63 | Temp 97.9°F | Ht 69.0 in | Wt 183.6 lb

## 2021-10-01 DIAGNOSIS — E785 Hyperlipidemia, unspecified: Secondary | ICD-10-CM | POA: Insufficient documentation

## 2021-10-01 DIAGNOSIS — Z0000 Encounter for general adult medical examination without abnormal findings: Secondary | ICD-10-CM

## 2021-10-01 DIAGNOSIS — I10 Essential (primary) hypertension: Secondary | ICD-10-CM | POA: Insufficient documentation

## 2021-10-01 DIAGNOSIS — Z9189 Other specified personal risk factors, not elsewhere classified: Secondary | ICD-10-CM | POA: Insufficient documentation

## 2021-10-01 NOTE — Patient Instructions (Signed)
Barry [name] , Thank you for taking time to come for your Medicare Wellness Visit. I appreciate your ongoing commitment to your health goals. Please review the following plan we discussed and let me know if I can assist you in the future.   Screening recommendations/referrals: Colonoscopy: 06/29/14 Recommended yearly ophthalmology/optometry visit for glaucoma screening and checkup Recommended yearly dental visit for hygiene and checkup  Vaccinations: Influenza vaccine: 07/21/21 Pneumococcal vaccine: 05/07/14 Tdap vaccine: 05/18/11 Shingles vaccine: n/d   Covid-19: 11/29/19, 12/20/19  Advanced directives: no  Conditions/risks identified: none  Next appointment: Follow up in one year for your annual wellness visit. 10/07/22 @ 9am  Preventive Care 75 Years and Older, Male Preventive care refers to lifestyle choices and visits with your health care provider that can promote health and wellness. What does preventive care include? A yearly physical exam. This is also called an annual well check. Dental exams once or twice a year. Routine eye exams. Ask your health care provider how often you should have your eyes checked. Personal lifestyle choices, including: Daily care of your teeth and gums. Regular physical activity. Eating a healthy diet. Avoiding tobacco and drug use. Limiting alcohol use. Practicing safe sex. Taking low doses of aspirin every day. Taking vitamin and mineral supplements as recommended by your health care provider. What happens during an annual well check? The services and screenings done by your health care provider during your annual well check will depend on your age, overall health, lifestyle risk factors, and family history of disease. Counseling  Your health care provider may ask you questions about your: Alcohol use. Tobacco use. Drug use. Emotional well-being. Home and relationship well-being. Sexual activity. Eating habits. History of falls. Memory and  ability to understand (cognition). Work and work Statistician. Screening  You may have the following tests or measurements: Height, weight, and BMI. Blood pressure. Lipid and cholesterol levels. These may be checked every 5 years, or more frequently if you are over 28 years old. Skin check. Lung cancer screening. You may have this screening every year starting at age 48 if you have a 30-pack-year history of smoking and currently smoke or have quit within the past 15 years. Fecal occult blood test (FOBT) of the stool. You may have this test every year starting at age 75. Flexible sigmoidoscopy or colonoscopy. You may have a sigmoidoscopy every 5 years or a colonoscopy every 10 years starting at age 75. Prostate cancer screening. Recommendations will vary depending on your family history and other risks. Hepatitis C blood test. Hepatitis B blood test. Sexually transmitted disease (STD) testing. Diabetes screening. This is done by checking your blood sugar (glucose) after you have not eaten for a while (fasting). You may have this done every 1-3 years. Abdominal aortic aneurysm (AAA) screening. You may need this if you are a current or former smoker. Osteoporosis. You may be screened starting at age 81 if you are at high risk. Talk with your health care provider about your test results, treatment options, and if necessary, the need for more tests. Vaccines  Your health care provider may recommend certain vaccines, such as: Influenza vaccine. This is recommended every year. Tetanus, diphtheria, and acellular pertussis (Tdap, Td) vaccine. You may need a Td booster every 10 years. Zoster vaccine. You may need this after age 61. Pneumococcal 13-valent conjugate (PCV13) vaccine. One dose is recommended after age 13. Pneumococcal polysaccharide (PPSV23) vaccine. One dose is recommended after age 27. Talk to your health care provider about which screenings  and vaccines you need and how often you need  them. This information is not intended to replace advice given to you by your health care provider. Make sure you discuss any questions you have with your health care provider. Document Released: 10/30/2015 Document Revised: 06/22/2016 Document Reviewed: 08/04/2015 Elsevier Interactive Patient Education  2017 [employer] Prevention in the Home Falls can cause injuries. They can happen to people of all ages. There are many things you can do to make your home safe and to help prevent falls. What can I do on the outside of my home? Regularly fix the edges of walkways and driveways and fix any cracks. Remove anything that might make you trip as you walk through a door, such as a raised step or threshold. Trim any bushes or trees on the path to your home. Use bright outdoor lighting. Clear any walking paths of anything that might make someone trip, such as rocks or tools. Regularly check to see if handrails are loose or broken. Make sure that both sides of any steps have handrails. Any raised decks and porches should have guardrails on the edges. Have any leaves, snow, or ice cleared regularly. Use sand or salt on walking paths during winter. Clean up any spills in your garage right away. This includes oil or grease spills. What can I do in the bathroom? Use night lights. Install grab bars by the toilet and in the tub and shower. Do not use towel bars as grab bars. Use non-skid mats or decals in the tub or shower. If you need to sit down in the shower, use a plastic, non-slip stool. Keep the floor dry. Clean up any water that spills on the floor as soon as it happens. Remove soap buildup in the tub or shower regularly. Attach bath mats securely with double-sided non-slip rug tape. Do not have throw rugs and other things on the floor that can make you trip. What can I do in the bedroom? Use night lights. Make sure that you have a light by your bed that is easy to reach. Do not use  any sheets or blankets that are too big for your bed. They should not hang down onto the floor. Have a firm chair that has side arms. You can use this for support while you get dressed. Do not have throw rugs and other things on the floor that can make you trip. What can I do in the kitchen? Clean up any spills right away. Avoid walking on wet floors. Keep items that you use a lot in easy-to-reach places. If you need to reach something above you, use a strong step stool that has a grab bar. Keep electrical cords out of the way. Do not use floor polish or wax that makes floors slippery. If you must use wax, use non-skid floor wax. Do not have throw rugs and other things on the floor that can make you trip. What can I do with my stairs? Do not leave any items on the stairs. Make sure that there are handrails on both sides of the stairs and use them. Fix handrails that are broken or loose. Make sure that handrails are as long as the stairways. Check any carpeting to make sure that it is firmly attached to the stairs. Fix any carpet that is loose or worn. Avoid having throw rugs at the top or bottom of the stairs. If you do have throw rugs, attach them to the floor with carpet tape.  Make sure that you have a light switch at the top of the stairs and the bottom of the stairs. If you do not have them, ask someone to add them for you. What else can I do to help prevent falls? Wear shoes that: Do not have high heels. Have rubber bottoms. Are comfortable and fit you well. Are closed at the toe. Do not wear sandals. If you use a stepladder: Make sure that it is fully opened. Do not climb a closed stepladder. Make sure that both sides of the stepladder are locked into place. Ask someone to hold it for you, if possible. Clearly mark and make sure that you can see: Any grab bars or handrails. First and last steps. Where the edge of each step is. Use tools that help you move around (mobility aids)  if they are needed. These include: Canes. Walkers. Scooters. Crutches. Turn on the lights when you go into a dark area. Replace any light bulbs as soon as they burn out. Set up your furniture so you have a clear path. Avoid moving your furniture around. If any of your floors are uneven, fix them. If there are any pets around you, be aware of where they are. Review your medicines with your doctor. Some medicines can make you feel dizzy. This can increase your chance of falling. Ask your doctor what other things that you can do to help prevent falls. This information is not intended to replace advice given to you by your health care provider. Make sure you discuss any questions you have with your health care provider. Document Released: 07/30/2009 Document Revised: 03/10/2016 Document Reviewed: 11/07/2014 Elsevier Interactive Patient Education  2017 [employer].

## 2021-10-01 NOTE — Progress Notes (Signed)
Subjective:   Barry [name] is a 75 y.o. male who presents for Medicare Annual/Subsequent preventive examination.  Review of Systems           Objective:    Today's Vitals   10/01/21 0855 10/01/21 0857  BP: 104/66   Pulse: 63   Temp: 97.9 F (36.6 C)   TempSrc: Oral   SpO2: 97%   Weight: 183 lb 9.6 oz (83.3 kg)   Height: 5\' 9"  (1.753 m)   PainSc:  0-No pain   Body mass index is 27.11 kg/m.  Advanced Directives 09/08/2020 05/15/2019 05/08/2019 08/14/2018 02/05/2018 01/16/2018 06/27/2017  Does Patient Have a Medical Advance Directive? No No No No No No No  Would patient like information on creating a medical advance directive? - No - Patient declined - No - Patient declined No - Patient declined No - Patient declined No - Patient declined    Current Medications (verified) Outpatient Encounter Medications as of 10/01/2021  Medication Sig   aspirin 81 MG tablet Take 1 tablet (81 mg total) by mouth daily.   atenolol (TENORMIN) 50 MG tablet Take 1 tablet by mouth daily.   atorvastatin (LIPITOR) 80 MG tablet Take 1 tablet (80 mg total) by mouth daily at 6 PM.   carvedilol (COREG) 12.5 MG tablet Take 1 tablet (12.5 mg total) by mouth 2 (two) times daily.   Famotidine (PEPCID AC PO) Take 1 tablet by mouth as needed.   FLUAD QUADRIVALENT 0.5 ML injection    fluticasone (FLONASE) 50 MCG/ACT nasal spray Place 2 sprays into both nostrils daily. Use for 4-6 weeks then stop and use seasonally or as needed.   tamsulosin (FLOMAX) 0.4 MG CAPS capsule Take 1 capsule (0.4 mg total) by mouth daily.   ticagrelor (BRILINTA) 60 MG TABS tablet Take 1 tablet (60 mg total) by mouth 2 (two) times daily.   valsartan (DIOVAN) 40 MG tablet Take 1 tablet (40 mg total) by mouth daily.   No facility-administered encounter medications on file as of 10/01/2021.    Allergies (verified) Lisinopril   History: Past Medical History:  Diagnosis Date   Arthritis    BPH (benign prostatic hypertrophy)     Coronary artery disease    Dyspnea    d/t heart problem.  worsens in hot or cold extremes   GERD (gastroesophageal reflux disease)    Hypertension    Myocardial infarction [facility])    April 2019. 1 stent placed   Past Surgical History:  Procedure Laterality Date   CATARACT EXTRACTION W/PHACO Right 04/12/2016   Procedure: CATARACT EXTRACTION PHACO AND INTRAOCULAR LENS PLACEMENT ([place]);  Surgeon: Barry Robson, MD;  Location: [facility] ORS;  Service: Ophthalmology;  Laterality: Right;  [place]' 01:05AP% 26.6CDE 17.63fluid pack lot # [id number]   CORONARY/GRAFT ACUTE MI REVASCULARIZATION N/A 01/16/2018   Procedure: Coronary/Graft Acute MI Revascularization;  Surgeon: Barry Bush, MD;  Location: [place] CV LAB;  Service: Cardiovascular;  Laterality: N/A;   EYE SURGERY     KNEE SURGERY Right 09/21/2012   arthroscopy   LEFT HEART CATH AND CORONARY ANGIOGRAPHY N/A 01/16/2018   Procedure: LEFT HEART CATH AND CORONARY ANGIOGRAPHY;  Surgeon: Barry Bush, MD;  Location: [place] [facility];  Service: Cardiovascular;  Laterality: N/A;   TOTAL KNEE ARTHROPLASTY Right 05/15/2019   Procedure: TOTAL KNEE ARTHROPLASTY;  Surgeon: Barry Sheehan, MD;  Location: [facility] ORS;  Service: Orthopedics;  Laterality: Right;   Family History  Problem Relation Age of Onset   Diabetes Mother  Stroke Mother    Prostate cancer Brother    Melanoma Brother    Hyperlipidemia Paternal Aunt    Social History   Socioeconomic History   Marital status: Married    Spouse name: Baldo Ash   Number of children: Not on file   Years of education: 12th grade   Highest education level: High school graduate  Occupational History   Occupation: retired  Tobacco Use   Smoking status: Former    Types: Cigars    Quit date: 05/07/1974    Years since quitting: 47.4   Smokeless tobacco: Former    Types: Chew    Quit date: 10/17/1993  Vaping Use   Vaping Use: Never used  Substance and Sexual Activity   Alcohol use: No    Drug use: No   Sexual activity: Not on file  Other Topics Concern   Not on file  Social History Narrative   Not on file   Social Determinants of Health   Financial Resource Strain: Not on file  Food Insecurity: Not on file  Transportation Needs: Not on file  Physical Activity: Not on file  Stress: Not on file  Social Connections: Not on file    Tobacco Counseling Counseling given: Not Answered   Clinical Intake:  Pre-visit preparation completed: Yes  Pain : No/denies pain Pain Score: 0-No pain     Nutritional Status: BMI 25 -29 Overweight Nutritional Risks: None Diabetes: No  How often do you need to have someone help you when you read instructions, pamphlets, or other written materials from your doctor or pharmacy?: 1 - Never  Diabetic?no, per pt  Interpreter Needed?: No  Information entered by :: Kirke Shaggy, LPN   Activities of Daily Living In your present state of health, do you have any difficulty performing the following activities: 02/09/2021  Hearing? N  Vision? N  Difficulty concentrating or making decisions? N  Walking or climbing stairs? N  Dressing or bathing? N  Doing errands, shopping? N  Some recent data might be hidden    Patient Care Team: Olin Hauser, DO as PCP - General (Family Medicine) Rockey Situ Kathlene November, MD as PCP - Cardiology (Cardiology) Minna Merritts, MD as Consulting Physician (Cardiology)  Indicate any recent Medical Services you may have received from other than [facility] providers in the past year (date may be approximate).     Assessment:   This is a routine wellness examination for Barry [name].  Hearing/Vision screen No results found.  Dietary issues and exercise activities discussed:     Goals Addressed   None    Depression Screen PHQ 2/9 Scores 08/20/2021 02/09/2021 09/08/2020 08/11/2020 06/29/2020 09/03/2019 07/22/2019  PHQ - 2 Score 0 0 0 0 0 0 0  PHQ- 9 Score 1 2 - - - - -    Fall Risk Fall Risk   08/20/2021 02/09/2021 09/08/2020 08/11/2020 06/29/2020  Falls in the past year? 0 0 0 0 0  Comment - - - - -  Number falls in past yr: 0 0 - 0 0  Injury with Fall? 0 0 - 0 0  Risk for fall due to : - - Medication side effect - -  Follow up Falls evaluation completed Falls evaluation completed Falls evaluation completed;Education provided;Falls prevention discussed Falls evaluation completed Falls evaluation completed    FALL RISK PREVENTION PERTAINING TO THE HOME:  Any stairs in or around the home? Yes  If so, are there any without handrails? No  Home free of loose throw  rugs in walkways, pet beds, electrical cords, etc? Yes  Adequate lighting in your home to reduce risk of falls? Yes   ASSISTIVE DEVICES UTILIZED TO PREVENT FALLS:  Life alert? No  Use of a cane, walker or w/c? No  Grab bars in the bathroom? Yes  Shower chair or bench in shower? No  Elevated toilet seat or a handicapped toilet? Yes   TIMED UP AND GO:  Was the test performed? Yes .  Length of time to ambulate 10 feet: 6 sec.   Gait slow and steady without use of assistive device  Cognitive Function: MMSE - Mini Mental State Exam 05/19/2015  Orientation to time 5  Orientation to Place 5  Registration 3  Attention/ Calculation 5  Recall 3  Language- name 2 objects 2  Language- repeat 1  Language- follow 3 step command 3  Language- read & follow direction 1  Write a sentence 1  Copy design 1  Total score 30     6CIT Screen 09/08/2020 08/14/2018 06/27/2017  What Year? 0 points 0 points 0 points  What month? 0 points 0 points 0 points  What time? 0 points 0 points 0 points  Count back from 20 0 points 0 points 0 points  Months in reverse 0 points 0 points 0 points  Repeat phrase 0 points 2 points 2 points  Total Score 0 2 2    Immunizations Immunization History  Administered Date(s) Administered   Influenza, High Dose Seasonal PF 07/28/2015, 08/14/2018, 07/21/2021   Influenza,inj,quad, With  Preservative 07/31/2017, 07/18/2019   Influenza-Unspecified 06/17/2010, 07/18/2011, 06/17/2014, 07/20/2017, 07/17/2020   PFIZER(Purple Top)SARS-COV-2 Vaccination 11/29/2019, 12/20/2019   Pneumococcal Conjugate-13 05/07/2014   Pneumococcal Polysaccharide-23 10/12/2011   Pneumococcal-Unspecified 09/17/2011   Td 05/18/2011   Tdap 02/15/2008, 05/17/2008    TDAP status: Due, Education has been provided regarding the importance of this vaccine. Advised may receive this vaccine at local pharmacy or Health Dept. Aware to provide a copy of the vaccination record if obtained from local pharmacy or Health Dept. Verbalized acceptance and understanding.  Flu Vaccine status: Up to date  Pneumococcal vaccine status: Up to date  Covid-19 vaccine status: Completed vaccines  Qualifies for Shingles Vaccine? Yes   Zostavax completed No   Shingrix Completed?: No.    Education has been provided regarding the importance of this vaccine. Patient has been advised to call insurance company to determine out of pocket expense if they have not yet received this vaccine. Advised may also receive vaccine at local pharmacy or Health Dept. Verbalized acceptance and understanding.  Screening Tests Health Maintenance  Topic Date Due   Zoster Vaccines- Shingrix (1 of 2) Never done   OPHTHALMOLOGY EXAM  11/20/2019   COVID-19 Vaccine (3 - Booster for Pfizer series) 02/14/2020   TETANUS/TDAP  05/17/2021   HEMOGLOBIN A1C  02/13/2022   FOOT EXAM  08/20/2022   COLONOSCOPY (Pts 45-43yrs Insurance coverage will need to be confirmed)  06/29/2024   Pneumonia Vaccine 39+ Years old  Completed   INFLUENZA VACCINE  Completed   Hepatitis C Screening  Completed   HPV VACCINES  Aged Out    Health Maintenance  Health Maintenance Due  Topic Date Due   Zoster Vaccines- Shingrix (1 of 2) Never done   OPHTHALMOLOGY EXAM  11/20/2019   COVID-19 Vaccine (3 - Booster for Pfizer series) 02/14/2020   TETANUS/TDAP  05/17/2021     Colorectal cancer screening: Type of screening: Colonoscopy. Completed 06/29/14. Repeat every 10 years  Lung Cancer  Screening: (Low Dose CT Chest recommended if Age 34-80 years, 30 pack-year currently smoking OR have quit w/in 15years.) does not qualify.    Additional Screening:  Hepatitis C Screening: does qualify; Completed 07/15/19  Vision Screening: Recommended annual ophthalmology exams for early detection of glaucoma and other disorders of the eye. Is the patient up to date with their annual eye exam?  Yes  Who is the provider or what is the name of the office in which the patient attends annual eye exams? [facility] If pt is not established with a provider, would they like to be referred to a provider to establish care? No .   Dental Screening: Recommended annual dental exams for proper oral hygiene  Community Resource Referral / Chronic Care Management: CRR required this visit?  No   CCM required this visit?  No      Plan:     I have personally reviewed and noted the following in the patients chart:   Medical and social history Use of alcohol, tobacco or illicit drugs  Current medications and supplements including opioid prescriptions. Patient is not currently taking opioid prescriptions. Functional ability and status Nutritional status Physical activity Advanced directives List of other physicians Hospitalizations, surgeries, and ER visits in previous 12 months Vitals Screenings to include cognitive, depression, and falls Referrals and appointments  In addition, I have reviewed and discussed with patient certain preventive protocols, quality metrics, and best practice recommendations. A written personalized care plan for preventive services as well as general preventive health recommendations were provided to patient.     Dionisio David, LPN   21/22/4825   Nurse Notes: none

## 2021-10-11 ENCOUNTER — Emergency Department
Admission: EM | Admit: 2021-10-11 | Discharge: 2021-10-11 | Disposition: A | Discharge status: Home / Self Care | Payer: PPO | Attending: Emergency Medicine | Admitting: Emergency Medicine

## 2021-10-11 ENCOUNTER — Other Ambulatory Visit: Payer: Self-pay

## 2021-10-11 DIAGNOSIS — Z87891 Personal history of nicotine dependence: Secondary | ICD-10-CM | POA: Diagnosis not present

## 2021-10-11 DIAGNOSIS — I10 Essential (primary) hypertension: Secondary | ICD-10-CM | POA: Insufficient documentation

## 2021-10-11 DIAGNOSIS — Z79899 Other long term (current) drug therapy: Secondary | ICD-10-CM | POA: Insufficient documentation

## 2021-10-11 DIAGNOSIS — E119 Type 2 diabetes mellitus without complications: Secondary | ICD-10-CM | POA: Insufficient documentation

## 2021-10-11 DIAGNOSIS — Z955 Presence of coronary angioplasty implant and graft: Secondary | ICD-10-CM | POA: Diagnosis not present

## 2021-10-11 DIAGNOSIS — Z7982 Long term (current) use of aspirin: Secondary | ICD-10-CM | POA: Diagnosis not present

## 2021-10-11 DIAGNOSIS — Z96651 Presence of right artificial knee joint: Secondary | ICD-10-CM | POA: Diagnosis not present

## 2021-10-11 DIAGNOSIS — I2510 Atherosclerotic heart disease of native coronary artery without angina pectoris: Secondary | ICD-10-CM | POA: Diagnosis not present

## 2021-10-11 DIAGNOSIS — R040 Epistaxis: Secondary | ICD-10-CM | POA: Diagnosis not present

## 2021-10-11 LAB — CBC
HCT: 38.8 % — ABNORMAL LOW (ref 39.0–52.0)
Hemoglobin: 13.3 g/dL (ref 13.0–17.0)
MCH: 30.3 pg (ref 26.0–34.0)
MCHC: 34.3 g/dL (ref 30.0–36.0)
MCV: 88.4 fL (ref 80.0–100.0)
Platelets: 210 10*3/uL (ref 150–400)
RBC: 4.39 MIL/uL (ref 4.22–5.81)
RDW: 12.3 % (ref 11.5–15.5)
WBC: 8.8 10*3/uL (ref 4.0–10.5)
nRBC: 0 % (ref 0.0–0.2)

## 2021-10-11 MED ORDER — OXYMETAZOLINE HCL 0.05 % NA SOLN
1.0000 | Freq: Once | NASAL | Status: AC
  Administered 2021-10-11: 05:00:00 1 via NASAL
  Filled 2021-10-11: qty 30

## 2021-10-11 NOTE — ED Notes (Signed)
Pt verbalizes understanding of d/c instructions and follow up. 

## 2021-10-11 NOTE — ED Triage Notes (Addendum)
Pt arrived via pov, ambulatory to triage with daughter. Pt reports nose bleed starting around 0300 waking him uo from sleep. Pt denies hx of similar. Reports hx of HTN with established cardiologist, takes brilinta. Denies dizziness, of nausea at this time. NAD noted at this time.

## 2021-10-11 NOTE — Discharge Instructions (Addendum)
If you begin to have a nosebleed, use 1 squirt of Afrin and apply the nose clamps.  Use a small amount of Vaseline in your nose every night to prevent bleeding.  Return emergency department for worsening

## 2021-10-11 NOTE — ED Notes (Signed)
Pt reports nose bleed for several hours. Pt states the bleeding woke him this am. Pt takes blood thinners as well. Provider in room to assess pt,

## 2021-10-11 NOTE — ED Notes (Signed)
Nose clamp placed after afrin administered

## 2021-10-11 NOTE — ED Provider Notes (Signed)
[facility] Emergency Department Provider Note  ____________________________________________   Event Date/Time   First MD Initiated Contact with Patient 10/11/21 [id number]     (approximate)  I have reviewed the triage vital signs and the nursing notes.   HISTORY  Chief Complaint Epistaxis    HPI Barry [name] is a 75 y.o. male presents emergency department with nosebleed.  Patient states started about 3 AM when he woke up and noticed that there was bleeding.  Patient states he does snore very badly.  Is on blood thinner and aspirin today.  Past Medical History:  Diagnosis Date   Arthritis    BPH (benign prostatic hypertrophy)    Coronary artery disease    Dyspnea    d/t heart problem.  worsens in hot or cold extremes   GERD (gastroesophageal reflux disease)    Hypertension    Myocardial infarction [facility])    April 2019. 1 stent placed    Patient Active Problem List   Diagnosis Date Noted   Other personal history presenting hazards to health 10/01/2021   Essential hypertension 10/01/2021   Other and unspecified hyperlipidemia 10/01/2021   History of total knee arthroplasty 05/21/2019   Osteoarthritis of right knee 05/15/2019   Paroxysmal atrial fibrillation ([place]) 01/27/2018   Ischemic cardiomyopathy 01/27/2018   History of ST elevation myocardial infarction (STEMI) 01/16/2018   Rosacea 07/04/2017   Screening for prostate cancer 07/04/2017   Arthritis 06/06/2016   Type 2 diabetes mellitus with other specified complication ([place]) 79/89/2119   Osteoarthritis of knee 03/25/2016   Cerumen impaction 04/08/2015   Essential (primary) hypertension 04/08/2015   Difficulty hearing 04/08/2015   Gastric reflux 04/08/2015   Family history of malignant neoplasm of prostate 04/08/2015   Hemorrhoid 04/08/2015   Hyperlipidemia associated with type 2 diabetes mellitus ([place]) 04/08/2015   Pedal edema 04/08/2015   Blood in the urine 02/16/2015   Benign  prostatic hyperplasia with lower urinary tract symptoms 02/16/2015   Intermittent urinary stream 02/16/2015   Urinary hesitancy 02/16/2015    Past Surgical History:  Procedure Laterality Date   CATARACT EXTRACTION W/PHACO Right 04/12/2016   Procedure: CATARACT EXTRACTION PHACO AND INTRAOCULAR LENS PLACEMENT ([place]);  Surgeon: Birder Robson, MD;  Location: [facility] ORS;  Service: Ophthalmology;  Laterality: Right;  [place]' 01:05AP% 26.6CDE 17.73fluid pack lot # [id number]   CORONARY/GRAFT ACUTE MI REVASCULARIZATION N/A 01/16/2018   Procedure: Coronary/Graft Acute MI Revascularization;  Surgeon: Nelva Bush, MD;  Location: [place] CV LAB;  Service: Cardiovascular;  Laterality: N/A;   EYE SURGERY     KNEE SURGERY Right 09/21/2012   arthroscopy   LEFT HEART CATH AND CORONARY ANGIOGRAPHY N/A 01/16/2018   Procedure: LEFT HEART CATH AND CORONARY ANGIOGRAPHY;  Surgeon: Nelva Bush, MD;  Location: [place] CV LAB;  Service: Cardiovascular;  Laterality: N/A;   TOTAL KNEE ARTHROPLASTY Right 05/15/2019   Procedure: TOTAL KNEE ARTHROPLASTY;  Surgeon: Lovell Sheehan, MD;  Location: [facility] ORS;  Service: Orthopedics;  Laterality: Right;    Prior to Admission medications   Medication Sig Start Date End Date Taking? Authorizing Provider  aspirin 81 MG tablet Take 1 tablet (81 mg total) by mouth daily. 04/30/18   Minna Merritts, MD  atenolol (TENORMIN) 50 MG tablet Take 1 tablet by mouth daily. 03/23/11   [provider]  atorvastatin (LIPITOR) 80 MG tablet Take 1 tablet (80 mg total) by mouth daily at 6 PM. 10/12/20   Gollan, Kathlene November, MD  carvedilol (COREG) 12.5 MG tablet  Take 1 tablet (12.5 mg total) by mouth 2 (two) times daily. 10/12/20   Minna Merritts, MD  Famotidine (PEPCID AC PO) Take 1 tablet by mouth as needed.    [provider]  FLUAD QUADRIVALENT 0.5 ML injection  07/21/21   [provider]  fluticasone (FLONASE) 50 MCG/ACT nasal spray Place 2 sprays into  both nostrils daily. Use for 4-6 weeks then stop and use seasonally or as needed. 08/20/21   Karamalegos, Devonne Doughty, DO  tamsulosin (FLOMAX) 0.4 MG CAPS capsule Take 1 capsule (0.4 mg total) by mouth daily. 02/09/21   Karamalegos, Devonne Doughty, DO  ticagrelor (BRILINTA) 60 MG TABS tablet Take 1 tablet (60 mg total) by mouth 2 (two) times daily. 10/12/20   Minna Merritts, MD  valsartan (DIOVAN) 40 MG tablet Take 1 tablet (40 mg total) by mouth daily. 11/06/20   Minna Merritts, MD    Allergies Lisinopril  Family History  Problem Relation Age of Onset   Diabetes Mother    Stroke Mother    Prostate cancer Brother    Melanoma Brother    Hyperlipidemia Paternal Aunt     Social History Social History   Tobacco Use   Smoking status: Former    Types: Cigars    Quit date: 05/07/1974    Years since quitting: 47.4   Smokeless tobacco: Former    Types: Chew    Quit date: 10/17/1993  Vaping Use   Vaping Use: Never used  Substance Use Topics   Alcohol use: No   Drug use: No    Review of Systems  Constitutional: No fever/chills Eyes: No visual changes. ENT: No sore throat. Respiratory: Denies cough Cardiovascular: Denies chest pain Gastrointestinal: Denies abdominal pain Genitourinary: Negative for dysuria. Musculoskeletal: Negative for back pain. Skin: Negative for rash. Psychiatric: no mood changes,     ____________________________________________   PHYSICAL EXAM:  VITAL SIGNS: ED Triage Vitals  Enc Vitals Group     BP 10/11/21 0513 120/74     Pulse Rate 10/11/21 0513 72     Resp 10/11/21 0513 17     Temp 10/11/21 0513 98.3 F (36.8 C)     Temp Source 10/11/21 0513 Oral     SpO2 10/11/21 0513 94 %     Weight 10/11/21 0508 182 lb (82.6 kg)     Height 10/11/21 0508 5\' 9"  (1.753 m)     Head Circumference --      Peak Flow --      Pain Score 10/11/21 0508 0     Pain Loc --      Pain Edu? --      Excl. in [place]? --     Constitutional: Alert and oriented. Well  appearing and in no acute distress. Eyes: Conjunctivae are normal.  Head: Atraumatic. Nose: No congestion/rhinnorhea.  No active bleeding, no clots noted in the nare Mouth/Throat: Mucous membranes are moist.   Neck:  supple no lymphadenopathy noted Cardiovascular: Normal rate, regular rhythm. Heart sounds are normal Respiratory: Normal respiratory effort.  No retractions, lungs c t a  GU: deferred Musculoskeletal: FROM all extremities, warm and well perfused Neurologic:  Normal speech and language.  Skin:  Skin is warm, dry and intact. No rash noted. Psychiatric: Mood and affect are normal. Speech and behavior are normal.  ____________________________________________   LABS (all labs ordered are listed, but only abnormal results are displayed)  Labs Reviewed  CBC - Abnormal; Notable for the following components:  Result Value   HCT 38.8 (*)    All other components within normal limits   ____________________________________________   ____________________________________________  RADIOLOGY    ____________________________________________   PROCEDURES  Procedure(s) performed: No  Procedures    ____________________________________________   INITIAL IMPRESSION / ASSESSMENT AND PLAN / ED COURSE  Pertinent labs & imaging results that were available during my care of the patient were reviewed by me and considered in my medical decision making (see chart for details).   Patient 75 year old male presents with nosebleed.  See HPI.  Physical exam shows patient appears stable.  No active bleeding is noted.  CBC is normal, platelets are normal  Patient was given reassurance.  He is to keep the nose clamps we have provided for him.  He can use squirt of Afrin and applied the nose clamps if he begins to bleed at home.  We did discuss use of Vaseline in the nose due to the dry heat.  Patient is agreeable treatment plan.  Discharged stable condition.     Barry [name] was evaluated in Emergency Department on 10/11/2021 for the symptoms described in the history of present illness. He was evaluated in the context of the global COVID-19 pandemic, which necessitated consideration that the patient might be at risk for infection with the SARS-CoV-2 virus that causes COVID-19. Institutional protocols and algorithms that pertain to the evaluation of patients at risk for COVID-19 are in a state of rapid change based on information released by regulatory bodies including the CDC and federal and state organizations. These policies and algorithms were followed during the patient's care in the ED.    As part of my medical decision making, I reviewed the following data within the [place] History obtained from family, Nursing notes reviewed and incorporated, Labs reviewed , Old chart reviewed, Notes from prior ED visits, and [place] Controlled Substance Database  ____________________________________________   FINAL CLINICAL IMPRESSION(S) / ED DIAGNOSES  Final diagnoses:  Acute anterior epistaxis      NEW MEDICATIONS STARTED DURING THIS VISIT:  New Prescriptions   No medications on file     Note:  This document was prepared using Dragon voice recognition software and may include unintentional dictation errors.    Versie Starks, PA-C 10/11/21 0240    Nena Polio, MD 10/11/21 [phone]

## 2021-10-18 NOTE — Progress Notes (Signed)
Cardiology Office Note  Date:  10/19/2021   ID:  Barry [name], Barry [name] June 12, 1946, MRN [id number]  PCP:  Olin Hauser, DO   Chief Complaint  Patient presents with   12 month follow up     "Doing well." Medications reviewed by the patient verbally.     HPI:  76 year old gentleman with history of  CAD, prior PCI to mid RCA 01/2018 hypertension,  hyperlipidemia,  arthritis,  Anxiety  Chronic right knee pain,  Diabetes type 2, HBA1C 6.4 Ejection fraction 45 to 50% April 2019 Intolerance to several blood pressure medications as detailed below who presents for follow-up of his coronary disease,  STEMI April 2019, proximal to mid RCA April 2019  Last seen in clinic by myself dec 2021 Recent events discussed with him Episode of epistaxis, in the ER 10/11/21 Afrin and clip, discharged has follow-up with ENT Seem to happen around the very cold weather around Christmas  In the past has insisted on taking aspirin and Brilinta 60 twice daily Was very reluctant to come off either on prior office visits No chest pain or SOB Remote stenting  Exercising less Bike is broken  History of chronic shortness of breath Prior work-up including echocardiogram December 2020, normal ejection fraction EF 55%  EKG personally reviewed by myself on todays visit Normal sinus rhythm rate 67 bpm no significant ST-T wave changes  Other past medical history reviewed Paroxysmal atrial fibrillation noted prior to PCI, details unclear  Echocardiogram - Left ventricle: The cavity size was normal. Wall thickness was   increased in a pattern of mild LVH. Systolic function was mildly   reduced. The estimated ejection fraction was in the range of 45%   to 50%. There is hypokinesis of the basal-midinferolateral and   inferior myocardium. Left ventricular diastolic function   parameters were normal for the patient&'s age. - Aortic valve: There was trivial regurgitation. - Right ventricle: The  cavity size was normal. Systolic function   was mildly to moderately reduced.  January 16, 2018 chest pain, d/c on 01/18/2018 Cardiac catheterization performed inferior STEMI thrombotic occlusion to the proximal RCA moderate nonobstructive coronary disease of LAD and left circumflex Proximal to mid LAD is aneurysmal Drug-eluting stent placed to proximal to mid RCA 3.0 x 38 mm  Peak troponin 54 on January 17, 2018  Previous side effects to various medications Amlodipine, he developed swelling HCTZ, reported having dry skin, dry mouth Lisinopril, developed a cough Atenolol: did not work Coreg: ok so far  Family history of diabetes, MI Mom with CVA Dad with alzheimers  PMH:   has a past medical history of Arthritis, BPH (benign prostatic hypertrophy), Coronary artery disease, Dyspnea, GERD (gastroesophageal reflux disease), Hypertension, and Myocardial infarction ([place]).  PSH:    Past Surgical History:  Procedure Laterality Date   CATARACT EXTRACTION W/PHACO Right 04/12/2016   Procedure: CATARACT EXTRACTION PHACO AND INTRAOCULAR LENS PLACEMENT ([facility]);  Surgeon: Birder Robson, MD;  Location: [facility] ORS;  Service: Ophthalmology;  Laterality: Right;  [place]' 01:05AP% 26.6CDE 17.41fluid pack lot # [id number]   CORONARY/GRAFT ACUTE MI REVASCULARIZATION N/A 01/16/2018   Procedure: Coronary/Graft Acute MI Revascularization;  Surgeon: Nelva Bush, MD;  Location: [place] CV LAB;  Service: Cardiovascular;  Laterality: N/A;   EYE SURGERY     KNEE SURGERY Right 09/21/2012   arthroscopy   LEFT HEART CATH AND CORONARY ANGIOGRAPHY N/A 01/16/2018   Procedure: LEFT HEART CATH AND CORONARY ANGIOGRAPHY;  Surgeon: Nelva Bush, MD;  Location: [place] CV LAB;  Service: Cardiovascular;  Laterality: N/A;   TOTAL KNEE ARTHROPLASTY Right 05/15/2019   Procedure: TOTAL KNEE ARTHROPLASTY;  Surgeon: Lovell Sheehan, MD;  Location: [facility] ORS;  Service: Orthopedics;  Laterality: Right;    Current Outpatient  Medications  Medication Sig Dispense Refill   aspirin 81 MG tablet Take 1 tablet (81 mg total) by mouth daily. 30 tablet    atorvastatin (LIPITOR) 80 MG tablet Take 1 tablet (80 mg total) by mouth daily at 6 PM. 90 tablet 3   carvedilol (COREG) 12.5 MG tablet Take 1 tablet (12.5 mg total) by mouth 2 (two) times daily. 180 tablet 3   Famotidine (PEPCID AC PO) Take 1 tablet by mouth as needed.     FLUAD QUADRIVALENT 0.5 ML injection      fluticasone (FLONASE) 50 MCG/ACT nasal spray Place 2 sprays into both nostrils daily. Use for 4-6 weeks then stop and use seasonally or as needed. 16 g 3   tamsulosin (FLOMAX) 0.4 MG CAPS capsule Take 1 capsule (0.4 mg total) by mouth daily. 90 capsule 3   ticagrelor (BRILINTA) 60 MG TABS tablet Take 1 tablet (60 mg total) by mouth 2 (two) times daily. 180 tablet 3   valsartan (DIOVAN) 40 MG tablet Take 1 tablet (40 mg total) by mouth daily. 90 tablet 3   No current facility-administered medications for this visit.    Allergies:   Lisinopril   Social History:  The patient  reports that he quit smoking about 47 years ago. His smoking use included cigars. He quit smokeless tobacco use about 28 years ago.  His smokeless tobacco use included chew. He reports that he does not drink alcohol and does not use drugs.   Family History:   family history includes Diabetes in his mother; Hyperlipidemia in his paternal aunt; Melanoma in his brother; Prostate cancer in his brother; Stroke in his mother.    Review of Systems: Review of Systems  Constitutional: Negative.   HENT: Negative.    Respiratory:  Positive for shortness of breath.   Cardiovascular:  Positive for palpitations.  Gastrointestinal: Negative.   Musculoskeletal: Negative.   Neurological: Negative.   Psychiatric/Behavioral: Negative.    All other systems reviewed and are negative.  PHYSICAL EXAM: VS:  BP 120/60 (BP Location: Left Arm, Patient Position: Sitting, Cuff Size: Normal)    Pulse 67    Ht  5\' 9"  (1.753 m)    Wt 180 lb 8 oz (81.9 kg)    SpO2 98%    BMI 26.66 kg/m  , BMI Body mass index is 26.66 kg/m.  Constitutional:  oriented to person, place, and time. No distress.  HENT:  Head: Grossly normal Eyes:  no discharge. No scleral icterus.  Neck: No JVD, no carotid bruits  Cardiovascular: Regular rate and rhythm, no murmurs appreciated Pulmonary/Chest: Clear to auscultation bilaterally, no wheezes or rails Abdominal: Soft.  no distension.  no tenderness.  Musculoskeletal: Normal range of motion Neurological:  normal muscle tone. Coordination normal. No atrophy Skin: Skin warm and dry Psychiatric: normal affect, pleasant   Recent Labs: 08/16/2021: ALT 15; BUN 13; Creat 0.92; Potassium 4.3; Sodium 141; TSH 1.23 10/11/2021: Hemoglobin 13.3; Platelets 210    Lipid Panel Lab Results  Component Value Date   CHOL 104 08/16/2021   HDL 37 (L) 08/16/2021   LDLCALC 50 08/16/2021   TRIG 89 08/16/2021      Wt Readings from Last 3 Encounters:  10/19/21 180 lb 8 oz (81.9 kg)  10/11/21 182 lb (  82.6 kg)  10/01/21 183 lb 9.6 oz (83.3 kg)     ASSESSMENT AND PLAN:  Coronary artery disease with stable angina Prior STEMI presented April 2019 with STEMI occluded proximal RCA nonobstructive LAD disease, circumflex disease Echocardiogram normal ejection fraction December 2020 Long discussion with him concerning epistaxis, for now he would like to stay on Brilinta Recommended he could drop the aspirin  Essential (primary) hypertension - Blood pressure is well controlled on today's visit. No changes made to the medications.  Pure hypercholesterolemia -  Cholesterol is at goal on the current lipid regimen. No changes to the medications were made.  Diabetes mellitus due to underlying condition with hyperosmolarity without coma, without long-term current use of insulin (HCC) Recommended low carbohydrate diet, walking program Very sedentary  Shortness of breath Recommend he try  to keep his weight down, walking program  Chronic cough Possibly related to GERD   Total encounter time more than 25 minutes  Greater than 50% was spent in counseling and coordination of care with the patient    No orders of the defined types were placed in this encounter.    Signed, Esmond Plants, M.D., Ph.D. 10/19/2021  [place], [place]

## 2021-10-19 ENCOUNTER — Encounter: Payer: Self-pay | Admitting: Cardiovascular Disease

## 2021-10-19 ENCOUNTER — Ambulatory Visit: Payer: PPO | Admitting: Cardiovascular Disease

## 2021-10-19 ENCOUNTER — Other Ambulatory Visit: Payer: Self-pay

## 2021-10-19 VITALS — BP 120/60 | HR 67 | Ht 69.0 in | Wt 180.5 lb

## 2021-10-19 DIAGNOSIS — R0602 Shortness of breath: Secondary | ICD-10-CM

## 2021-10-19 DIAGNOSIS — I255 Ischemic cardiomyopathy: Secondary | ICD-10-CM | POA: Diagnosis not present

## 2021-10-19 DIAGNOSIS — I480 Paroxysmal atrial fibrillation: Secondary | ICD-10-CM

## 2021-10-19 DIAGNOSIS — I10 Essential (primary) hypertension: Secondary | ICD-10-CM | POA: Diagnosis not present

## 2021-10-19 DIAGNOSIS — E119 Type 2 diabetes mellitus without complications: Secondary | ICD-10-CM | POA: Diagnosis not present

## 2021-10-19 DIAGNOSIS — I25118 Atherosclerotic heart disease of native coronary artery with other forms of angina pectoris: Secondary | ICD-10-CM | POA: Diagnosis not present

## 2021-10-19 DIAGNOSIS — E7800 Pure hypercholesterolemia, unspecified: Secondary | ICD-10-CM

## 2021-10-19 MED ORDER — VALSARTAN 40 MG PO TABS: 40.0000 mg | ORAL_TABLET | Freq: Every day | ORAL | 3 refills | Status: DC

## 2021-10-19 MED ORDER — TICAGRELOR 60 MG PO TABS: 60.0000 mg | ORAL_TABLET | Freq: Two times a day (BID) | ORAL | 3 refills | Status: DC

## 2021-10-19 MED ORDER — ATORVASTATIN CALCIUM 80 MG PO TABS: 80.0000 mg | ORAL_TABLET | Freq: Every day | ORAL | 3 refills | Status: DC

## 2021-10-19 MED ORDER — CARVEDILOL 12.5 MG PO TABS: 12.5000 mg | ORAL_TABLET | Freq: Two times a day (BID) | ORAL | 3 refills | Status: DC

## 2021-10-19 NOTE — Patient Instructions (Addendum)
Medication Instructions:  Stop the asa Continue on brilinta  If you need a refill on your cardiac medications before your next appointment, please call your pharmacy.   Lab work: No new labs needed  Testing/Procedures: No new testing needed  Follow-Up: At [facility], you and your health needs are our priority.  As part of our continuing mission to provide you with exceptional heart care, we have created designated Provider Care Teams.  These Care Teams include your primary Cardiologist (physician) and Advanced Practice Providers (APPs -  Physician Assistants and Nurse Practitioners) who all work together to provide you with the care you need, when you need it.  You will need a follow up appointment in 12 months  Providers on your designated Care Team:   Murray Hodgkins, NP Christell Faith, PA-C Cadence Kathlen Mody, [place]  COVID-19 Vaccine Information can be found at: [email or web address].uk For questions related to vaccine distribution or appointments, please email [email or web address] or call [phone].

## 2022-02-11 DIAGNOSIS — H2512 Age-related nuclear cataract, left eye: Secondary | ICD-10-CM | POA: Diagnosis not present

## 2022-02-11 LAB — HM DIABETES EYE EXAM

## 2022-02-14 ENCOUNTER — Other Ambulatory Visit: Payer: PPO

## 2022-02-14 DIAGNOSIS — N401 Enlarged prostate with lower urinary tract symptoms: Secondary | ICD-10-CM

## 2022-02-14 DIAGNOSIS — E1169 Type 2 diabetes mellitus with other specified complication: Secondary | ICD-10-CM

## 2022-02-14 DIAGNOSIS — H2512 Age-related nuclear cataract, left eye: Secondary | ICD-10-CM | POA: Diagnosis not present

## 2022-02-14 DIAGNOSIS — R350 Frequency of micturition: Secondary | ICD-10-CM | POA: Diagnosis not present

## 2022-02-15 LAB — HEMOGLOBIN A1C
Hgb A1c MFr Bld: 6.6 % of total Hgb — ABNORMAL HIGH (ref <5.7)
Mean Plasma Glucose: 143 mg/dL
eAG (mmol/L): 7.9 mmol/L

## 2022-02-15 LAB — PSA: PSA: 1.19 ng/mL (ref <=4.00)

## 2022-02-17 DIAGNOSIS — H903 Sensorineural hearing loss, bilateral: Secondary | ICD-10-CM | POA: Diagnosis not present

## 2022-02-17 DIAGNOSIS — H6123 Impacted cerumen, bilateral: Secondary | ICD-10-CM | POA: Diagnosis not present

## 2022-02-17 DIAGNOSIS — R040 Epistaxis: Secondary | ICD-10-CM | POA: Diagnosis not present

## 2022-02-18 ENCOUNTER — Other Ambulatory Visit: Payer: Self-pay | Admitting: Family Medicine

## 2022-02-18 ENCOUNTER — Encounter: Payer: Self-pay | Admitting: Family Medicine

## 2022-02-18 ENCOUNTER — Ambulatory Visit (INDEPENDENT_AMBULATORY_CARE_PROVIDER_SITE_OTHER): Payer: PPO | Admitting: Family Medicine

## 2022-02-18 VITALS — BP 116/64 | HR 67 | Temp 97.5°F | Wt 175.0 lb

## 2022-02-18 DIAGNOSIS — N401 Enlarged prostate with lower urinary tract symptoms: Secondary | ICD-10-CM

## 2022-02-18 DIAGNOSIS — Z0000 Encounter for general adult medical examination without abnormal findings: Secondary | ICD-10-CM

## 2022-02-18 DIAGNOSIS — E1169 Type 2 diabetes mellitus with other specified complication: Secondary | ICD-10-CM

## 2022-02-18 DIAGNOSIS — E785 Hyperlipidemia, unspecified: Secondary | ICD-10-CM

## 2022-02-18 DIAGNOSIS — R350 Frequency of micturition: Secondary | ICD-10-CM | POA: Diagnosis not present

## 2022-02-18 DIAGNOSIS — I10 Essential (primary) hypertension: Secondary | ICD-10-CM | POA: Diagnosis not present

## 2022-02-18 MED ORDER — TAMSULOSIN HCL 0.4 MG PO CAPS: 0.4000 mg | ORAL_CAPSULE | Freq: Every day | ORAL | 3 refills | Status: DC

## 2022-02-18 NOTE — Patient Instructions (Addendum)
Thank you for coming to the office today. ? ?Double check Shingrix shingles vaccine record at home. If you had it before 07/2016 then you will need the new updated version. At pharmacy or here. ? ?Refilled Tamsulosin ? ?Recent Labs  ?  08/16/21 ?0808 02/14/22 ?2671  ?HGBA1C 6.7* 6.6*  ? ? ?PSA 1.19 (02/2022) last time was 1.05, if the PSA next time is 2 or higher we can refer to Urology. ? ? ?DUE for FASTING BLOOD WORK (no food or drink after midnight before the lab appointment, only water or coffee without cream/sugar on the morning of) ? ?SCHEDULE "Lab Only" visit in the morning at the clinic for lab draw in 6 MONTHS  ? ?- Make sure Lab Only appointment is at about 1 week before your next appointment, so that results will be available ? ?For Lab Results, once available within 2-3 days of blood draw, you can can log in to MyChart online to view your results and a brief explanation. Also, we can discuss results at next follow-up visit. ? ? ?Please schedule a Follow-up Appointment to: Return in about 6 months (around 08/21/2022) for 6 month fasting lab only then 1 week later Annual Physical. ? ?If you have any other questions or concerns, please feel free to call the office or send a message through [place]. You may also schedule an earlier appointment if necessary. ? ?Additionally, you may be receiving a survey about your experience at our office within a few days to 1 week by e-mail or mail. We value your feedback. ? ?Nobie Putnam, DO ?[place] ?

## 2022-02-18 NOTE — Assessment & Plan Note (Signed)
Limited improvement LUTS w/ higher dose Tamsulosin. ?- Last PSA 1.19 (01/2022) prior result 1.05 and prior to that 0.4 to 0.6 ?Fam hx brother has prostate CA age 76-70 ?- Last DRE mild enlarged prostate, unremarkable (07/10/18) ? ?Plan: ?1. Continue tamsulosin 0.'4mg'$  daily refilled ?Consider Finasteride, he declines we discussed risk of impacting PSA and he has fam history prostate CA brother. ?- Offered Urology consultation/referral if when ready, reconsider ? ?Repeat PSA 6 more months if elevated >2 will pursue referral, or sooner if chooses. Seems psa has stabilized. ?

## 2022-02-18 NOTE — Assessment & Plan Note (Signed)
Controlled HTN ?No known complications ? ?Plan: ?1. Continue Carvedilol 12.'5mg'$  BID -  indication with s/p MI ?- Continue on Valsartan '40mg'$  daily ?- Encouraged low sodium diet, regular exercise ?- Monitor BP at home ?

## 2022-02-18 NOTE — Progress Notes (Signed)
? ?Subjective:  ? ? Patient ID: Barry [name], male    DOB: Jul 28, 1946, 76 y.o.   MRN: [id number] ? ?Barry [name] is a 76 y.o. male presenting on 02/18/2022 for Benign Prostatic Hypertrophy ? ? ?HPI ? ?CHRONIC DM, Type 2: ?A1c 6.6, last time 6.7 ?Not checking CBG at home ?Meds: None - diet controlled ?Currently on Losartan ?Lifestyle: ?- Weight down 5 lbs ?- Diet (improved DM diet) ?- Exercise (active ?Denies hypoglycemia ?  ?CHRONIC HTN: ?Reports no new concerns. Home BP readings normal controlled, similar to before ?Current Meds - Carvedilol 12.'5mg'$  BID, Losartan '25mg'$  daily   ?Reports good compliance, took meds today. Tolerating well, w/o complaints. ?   ?  ?BPH with LUTS / Prostate Cancer Screening ?Last PSA 1.05 07/2021, (previous 0.62 1 year ago) ?- He is doing well on Flomax 0.'4mg'$  daily but tried x 2 dose in past without change ?Never on Finasteride but not interested today. ?Admits some worse dribbling and LUTS ?- Prior treatments including Rapaflo, Silodosin ?Denies hematuria, dysuria, abdominal pain or flank pain ? ? ?Health Maintenance: ?PSA 1.19 stable from prior 1.05 last 6 months. Prior to that 0.6. His brother has prostate cancer dx age 35. ? ? ?  02/18/2022  ? 10:09 AM 10/01/2021  ?  9:00 AM 08/20/2021  ? 10:45 AM  ?Depression screen PHQ 2/9  ?Decreased Interest 0 0 0  ?Down, Depressed, Hopeless 0 0 0  ?PHQ - 2 Score 0 0 0  ?Altered sleeping 0 0 0  ?Tired, decreased energy 0 0 0  ?Change in appetite 0 0 0  ?Feeling bad or failure about yourself  0 0 0  ?Trouble concentrating 0 0 0  ?Moving slowly or fidgety/restless 0 1 1  ?Suicidal thoughts 0 0 0  ?PHQ-9 Score 0 1 1  ?Difficult doing work/chores Not difficult at all Not difficult at all Not difficult at all  ? ? ?Social History  ? ?Tobacco Use  ? Smoking status: Former  ?  Types: Cigars  ?  Quit date: 05/07/1974  ?  Years since quitting: 47.8  ? Smokeless tobacco: Former  ?  Types: Chew  ?  Quit date: 10/17/1993  ?Vaping Use  ? Vaping Use: Never  used  ?Substance Use Topics  ? Alcohol use: No  ? Drug use: No  ? ? ?Review of Systems ?Per HPI unless specifically indicated above ? ?   ?Objective:  ?  ?BP 116/64 (BP Location: Left Arm, Patient Position: Sitting, Cuff Size: Large)   Pulse 67   Temp (!) 97.5 ?F (36.4 ?C) (Temporal)   Wt 175 lb (79.4 kg)   SpO2 98%   BMI 25.84 kg/m?   ?Wt Readings from Last 3 Encounters:  ?02/18/22 175 lb (79.4 kg)  ?10/19/21 180 lb 8 oz (81.9 kg)  ?10/11/21 182 lb (82.6 kg)  ?  ?Physical Exam ?Vitals and nursing note reviewed.  ?Constitutional:   ?   General: He is not in acute distress. ?   Appearance: Normal appearance. He is well-developed. He is not diaphoretic.  ?   Comments: Well-appearing, comfortable, cooperative  ?HENT:  ?   Head: Normocephalic and atraumatic.  ?Eyes:  ?   General:     ?   Right eye: No discharge.     ?   Left eye: No discharge.  ?   Conjunctiva/sclera: Conjunctivae normal.  ?Cardiovascular:  ?   Rate and Rhythm: Normal rate.  ?Pulmonary:  ?   Effort: Pulmonary effort is  normal.  ?Skin: ?   General: Skin is warm and dry.  ?   Findings: No erythema or rash.  ?Neurological:  ?   Mental Status: He is alert and oriented to person, place, and time.  ?Psychiatric:     ?   Mood and Affect: Mood normal.     ?   Behavior: Behavior normal.     ?   Thought Content: Thought content normal.  ?   Comments: Well groomed, good eye contact, normal speech and thoughts  ? ? ? ?Results for orders placed or performed in visit on 02/14/22  ?PSA  ?Result Value Ref Range  ? PSA 1.19 < OR = 4.00 ng/mL  ?Hemoglobin A1c  ?Result Value Ref Range  ? Hgb A1c MFr Bld 6.6 (H) <5.7 % of total Hgb  ? Mean Plasma Glucose 143 mg/dL  ? eAG (mmol/L) 7.9 mmol/L  ? ?   ?Assessment & Plan:  ? ?Problem List Items Addressed This Visit   ? ? Type 2 diabetes mellitus with other specified complication ([place]) - Primary  ? Essential hypertension  ?  Controlled HTN ?No known complications ? ?Plan: ?1. Continue Carvedilol 12.'5mg'$  BID -  indication  with s/p MI ?- Continue on Valsartan '40mg'$  daily ?- Encouraged low sodium diet, regular exercise ?- Monitor BP at home ? ?  ?  ? Benign prostatic hyperplasia with lower urinary tract symptoms  ?  Limited improvement LUTS w/ higher dose Tamsulosin. ?- Last PSA 1.19 (01/2022) prior result 1.05 and prior to that 0.4 to 0.6 ?Fam hx brother has prostate CA age 77-70 ?- Last DRE mild enlarged prostate, unremarkable (07/10/18) ? ?Plan: ?1. Continue tamsulosin 0.'4mg'$  daily refilled ?Consider Finasteride, he declines we discussed risk of impacting PSA and he has fam history prostate CA brother. ?- Offered Urology consultation/referral if when ready, reconsider ? ?Repeat PSA 6 more months if elevated >2 will pursue referral, or sooner if chooses. Seems psa has stabilized. ?  ?  ? Relevant Medications  ? tamsulosin (FLOMAX) 0.4 MG CAPS capsule  ?  ? ? ?Meds ordered this encounter  ?Medications  ? tamsulosin (FLOMAX) 0.4 MG CAPS capsule  ?  Sig: Take 1 capsule (0.4 mg total) by mouth daily.  ?  Dispense:  90 capsule  ?  Refill:  3  ? ? ? ?Follow up plan: ?Return in about 6 months (around 08/21/2022) for 6 month fasting lab only then 1 week later Annual Physical. ? ?Future labs ordered for 08/17/22 add PSA ? ? ?Nobie Putnam, DO ?[facility] ?[facility] ?02/18/2022, 10:28 AM ?

## 2022-02-21 ENCOUNTER — Telehealth: Payer: Self-pay | Admitting: Family Medicine

## 2022-02-21 NOTE — Telephone Encounter (Signed)
Darlene w/ [facility] calling to ask that you send pt's last labs, office notes or anything that will indicate that pt is diabetic. ?Pt is having cataract surgery 5/23 and they need this information ? ?Fax [phone] ? ?Cb :[phone] ?

## 2022-02-22 ENCOUNTER — Encounter: Payer: Self-pay | Admitting: Family Medicine

## 2022-02-28 ENCOUNTER — Encounter: Payer: Self-pay | Admitting: Ophthalmology

## 2022-03-02 ENCOUNTER — Encounter: Payer: Self-pay | Admitting: Urology

## 2022-03-02 ENCOUNTER — Ambulatory Visit: Payer: PPO | Admitting: Urology

## 2022-03-02 ENCOUNTER — Telehealth: Payer: Self-pay | Admitting: Urology

## 2022-03-02 VITALS — BP 117/67 | HR 76 | Ht 70.0 in | Wt 175.0 lb

## 2022-03-02 DIAGNOSIS — Z8042 Family history of malignant neoplasm of prostate: Secondary | ICD-10-CM | POA: Diagnosis not present

## 2022-03-02 DIAGNOSIS — N401 Enlarged prostate with lower urinary tract symptoms: Secondary | ICD-10-CM | POA: Diagnosis not present

## 2022-03-02 MED ORDER — FINASTERIDE 5 MG PO TABS: 5.0000 mg | ORAL_TABLET | Freq: Every day | ORAL | 3 refills | Status: DC

## 2022-03-02 NOTE — Progress Notes (Signed)
? ?03/02/2022 ?12:31 PM  ? ?Glyn Ade Langenbach ?02-21-1946 ?[id number] ? ?Referring provider: Olin Hauser, DO ?[address] ?[place],  [place] [address] ? ?Chief Complaint  ?Patient presents with  ? Follow-up  ? ? ?HPI: ?Kervens Roper is a 76 y.o. male who presents for prostate check.  He presents today with his daughter ? ?Previously seen by me at [facility] and last seen in 2018 ?He was on silodosin for BPH ?Presently on tamsulosin and has moderate voiding symptoms including frequency, urgency and nocturia x2 ?IPSS today 12/35 ?Daughter is concerned about his slowly rising PSA.  Brother diagnosed with prostate cancer age 70 ?Denies dysuria, gross hematuria ?No flank, abdominal or pelvic pain ? ? ? ? ?PMH: ?Past Medical History:  ?Diagnosis Date  ? Arthritis   ? BPH (benign prostatic hypertrophy)   ? Coronary artery disease   ? Dyspnea   ? d/t heart problem.  worsens in hot or cold extremes  ? GERD (gastroesophageal reflux disease)   ? Hypertension   ? Myocardial infarction [facility])   ? April 2019. 1 stent placed  ? Wears dentures   ? partial upper  ? ? ?Surgical History: ?Past Surgical History:  ?Procedure Laterality Date  ? CATARACT EXTRACTION W/PHACO Right 04/12/2016  ? Procedure: CATARACT EXTRACTION PHACO AND INTRAOCULAR LENS PLACEMENT (IOC);  Surgeon: Birder Robson, MD;  Location: [facility] ORS;  Service: Ophthalmology;  Laterality: Right;  [place]' 01:05AP% 26.6CDE 17.15fuid pack lot # [id number]  ? CORONARY/GRAFT ACUTE MI REVASCULARIZATION N/A 01/16/2018  ? Procedure: Coronary/Graft Acute MI Revascularization;  Surgeon: ENelva Bush MD;  [facility];  Service: Cardiovascular;  Laterality: N/A;  ? EYE SURGERY    ? KNEE SURGERY Right 09/21/2012  ? arthroscopy  ? LEFT HEART CATH AND CORONARY ANGIOGRAPHY N/A 01/16/2018  ? Procedure: LEFT HEART CATH AND CORONARY ANGIOGRAPHY;  Surgeon: ENelva Bush MD;  [facility];  Service: Cardiovascular;  Laterality: N/A;  ? TOTAL KNEE  ARTHROPLASTY Right 05/15/2019  ? Procedure: TOTAL KNEE ARTHROPLASTY;  Surgeon: BLovell Sheehan MD;  Location: [facility] ORS;  Service: Orthopedics;  Laterality: Right;  ? ? ?Home Medications:  ?Allergies as of 03/02/2022   ? ?   Reactions  ? Lisinopril Cough  ? ?  ? ?  ?Medication List  ?  ? ?  ? Accurate as of Mar 02, 2022 12:31 PM. If you have any questions, ask your nurse or doctor.  ?  ?  ? ?  ? ?atorvastatin 80 MG tablet ?Commonly known as: LIPITOR ?Take 1 tablet (80 mg total) by mouth daily at 6 PM. ?  ?carvedilol 12.5 MG tablet ?Commonly known as: COREG ?Take 1 tablet (12.5 mg total) by mouth 2 (two) times daily. ?What changed: how much to take ?  ?finasteride 5 MG tablet ?Commonly known as: PROSCAR ?Take 1 tablet (5 mg total) by mouth daily. ?Started by: SAbbie Sons MD ?  ?fluticasone 50 MCG/ACT nasal spray ?Commonly known as: FLONASE ?Place 2 sprays into both nostrils daily. Use for 4-6 weeks then stop and use seasonally or as needed. ?  ?PEPCID AC PO ?Take 1 tablet by mouth as needed. ?  ?tamsulosin 0.4 MG Caps capsule ?Commonly known as: FLOMAX ?Take 1 capsule (0.4 mg total) by mouth daily. ?  ?ticagrelor 60 MG Tabs tablet ?Commonly known as: BRILINTA ?Take 1 tablet (60 mg total) by mouth 2 (two) times daily. ?  ?valsartan 40 MG tablet ?Commonly known as: Diovan ?Take 1 tablet (  40 mg total) by mouth daily. ?  ? ?  ? ? ?Allergies:  ?Allergies  ?Allergen Reactions  ? Lisinopril Cough  ? ? ?Family History: ?Family History  ?Problem Relation Age of Onset  ? Diabetes Mother   ? Stroke Mother   ? Prostate cancer Brother   ? Melanoma Brother   ? Hyperlipidemia Paternal Aunt   ? ? ?Social History:  reports that he quit smoking about 47 years ago. His smoking use included cigars. He quit smokeless tobacco use about 28 years ago.  His smokeless tobacco use included chew. He reports that he does not drink alcohol and does not use drugs. ? ? ?Physical Exam: ?BP 117/67   Pulse 76   Ht '5\' 10"'$  (1.778 m)   Wt 175 lb  (79.4 kg)   BMI 25.11 kg/m?   ?Constitutional:  Alert and oriented, No acute distress. ?HEENT: [place] AT, moist mucus membranes.  Trachea midline, no masses. ?Cardiovascular: No clubbing, cyanosis, or edema. ?Respiratory: Normal respiratory effort, no increased work of breathing. ?GI: Abdomen is soft, nontender, nondistended, no abdominal masses ?GU: Prostate 45 g, smooth without nodules ?Psychiatric: Normal mood and affect. ? ? ? ?Assessment & Plan:   ? ?1.  BPH with LUTS ?Moderate lower urinary tract symptoms which he states are bothersome ?He states his symptoms are bothersome enough that he would be okay with taking additional medication.  We discussed 5-ARI inhibitors and Rx finasteride was sent to pharmacy.  We discussed this medication will take at least 4-6 months before he would see any symptomatic improvement ?Continue tamsulosin ? ?2.  Family history prostate cancer ?Benign DRE ?Patient and daughter reassured his slow PSA trend is not significant and we discussed that an abnormal PSA velocity is based on a >0.75 increase per year ? ? ? ?Abbie Sons, MD ? ?[facility] ?[address] ?[place], [place] [address] ?[phone] ? ?

## 2022-03-02 NOTE — Telephone Encounter (Signed)
Called patient and fix the medication to 90 pills  ?

## 2022-03-02 NOTE — Addendum Note (Signed)
Addended by: Chrystie Nose on: 03/02/2022 02:05 PM ? ? Modules accepted: Orders ? ?

## 2022-03-02 NOTE — Telephone Encounter (Signed)
Patient calling regarding, finasteride (PROSCAR) 5 MG tablet. He picked up Rx from [facility] and only had 9 tablets. He needs 90 days called in. Please call patient when this has been adjusted. ?

## 2022-03-07 NOTE — Discharge Instructions (Signed)

## 2022-03-08 ENCOUNTER — Other Ambulatory Visit: Payer: Self-pay

## 2022-03-08 ENCOUNTER — Encounter
Admission: RE | Disposition: A | Discharge status: Home / Self Care | Payer: Self-pay | Source: Home / Self Care | Attending: Ophthalmology

## 2022-03-08 ENCOUNTER — Ambulatory Visit
Admission: RE | Admit: 2022-03-08 | Discharge: 2022-03-08 | Disposition: A | Discharge status: Home / Self Care | Payer: PPO | Attending: Ophthalmology | Admitting: Ophthalmology

## 2022-03-08 ENCOUNTER — Ambulatory Visit: Payer: PPO | Admitting: Anesthesiology

## 2022-03-08 ENCOUNTER — Encounter: Payer: Self-pay | Admitting: Ophthalmology

## 2022-03-08 DIAGNOSIS — I252 Old myocardial infarction: Secondary | ICD-10-CM | POA: Insufficient documentation

## 2022-03-08 DIAGNOSIS — M1990 Unspecified osteoarthritis, unspecified site: Secondary | ICD-10-CM | POA: Diagnosis not present

## 2022-03-08 DIAGNOSIS — H2512 Age-related nuclear cataract, left eye: Secondary | ICD-10-CM | POA: Insufficient documentation

## 2022-03-08 DIAGNOSIS — I10 Essential (primary) hypertension: Secondary | ICD-10-CM | POA: Insufficient documentation

## 2022-03-08 DIAGNOSIS — H25812 Combined forms of age-related cataract, left eye: Secondary | ICD-10-CM | POA: Diagnosis not present

## 2022-03-08 DIAGNOSIS — Z87891 Personal history of nicotine dependence: Secondary | ICD-10-CM | POA: Diagnosis not present

## 2022-03-08 DIAGNOSIS — K219 Gastro-esophageal reflux disease without esophagitis: Secondary | ICD-10-CM | POA: Diagnosis not present

## 2022-03-08 DIAGNOSIS — N400 Enlarged prostate without lower urinary tract symptoms: Secondary | ICD-10-CM | POA: Diagnosis not present

## 2022-03-08 DIAGNOSIS — Z955 Presence of coronary angioplasty implant and graft: Secondary | ICD-10-CM | POA: Insufficient documentation

## 2022-03-08 HISTORY — PX: CATARACT EXTRACTION W/PHACO: SHX586

## 2022-03-08 HISTORY — DX: Presence of dental prosthetic device (complete) (partial): Z97.2

## 2022-03-08 SURGERY — PHACOEMULSIFICATION, CATARACT, WITH IOL INSERTION
Anesthesia: Monitor Anesthesia Care | Site: Eye | Laterality: Left

## 2022-03-08 MED ORDER — FENTANYL CITRATE (PF) 100 MCG/2ML IJ SOLN
INTRAMUSCULAR | Status: DC | PRN
  Administered 2022-03-08: 50 ug via INTRAVENOUS

## 2022-03-08 MED ORDER — ACETAMINOPHEN 500 MG PO TABS: 1000.0000 mg | ORAL_TABLET | Freq: Once | ORAL | Status: DC | PRN

## 2022-03-08 MED ORDER — BRIMONIDINE TARTRATE-TIMOLOL 0.2-0.5 % OP SOLN
OPHTHALMIC | Status: DC | PRN
  Administered 2022-03-08: 1 [drp] via OPHTHALMIC

## 2022-03-08 MED ORDER — MIDAZOLAM HCL 2 MG/2ML IJ SOLN
INTRAMUSCULAR | Status: DC | PRN
Start: 2022-03-08 — End: 2022-03-08
  Administered 2022-03-08: 2 mg via INTRAVENOUS

## 2022-03-08 MED ORDER — SIGHTPATH DOSE#1 NA CHONDROIT SULF-NA HYALURON 40-17 MG/ML IO SOLN
INTRAOCULAR | Status: DC | PRN
  Administered 2022-03-08: 1 mL via INTRAOCULAR

## 2022-03-08 MED ORDER — ONDANSETRON HCL 4 MG/2ML IJ SOLN: 4.0000 mg | Freq: Once | INTRAMUSCULAR | Status: DC | PRN

## 2022-03-08 MED ORDER — SIGHTPATH DOSE#1 BSS IO SOLN
INTRAOCULAR | Status: DC | PRN
  Administered 2022-03-08: 2 mL

## 2022-03-08 MED ORDER — SIGHTPATH DOSE#1 BSS IO SOLN
INTRAOCULAR | Status: DC | PRN
  Administered 2022-03-08: 15 mL via INTRAOCULAR

## 2022-03-08 MED ORDER — MOXIFLOXACIN HCL 0.5 % OP SOLN
OPHTHALMIC | Status: DC | PRN
  Administered 2022-03-08: 0.2 mL via OPHTHALMIC

## 2022-03-08 MED ORDER — SIGHTPATH DOSE#1 BSS IO SOLN
INTRAOCULAR | Status: DC | PRN
  Administered 2022-03-08: 81 mL via OPHTHALMIC

## 2022-03-08 MED ORDER — TETRACAINE HCL 0.5 % OP SOLN
1.0000 [drp] | OPHTHALMIC | Status: DC | PRN
Start: 2022-03-08 — End: 2022-03-08
  Administered 2022-03-08 (×2): 1 [drp] via OPHTHALMIC

## 2022-03-08 MED ORDER — ACETAMINOPHEN 160 MG/5ML PO SOLN: 975.0000 mg | Freq: Once | ORAL | Status: DC | PRN

## 2022-03-08 MED ORDER — LACTATED RINGERS IV SOLN: INTRAVENOUS | Status: DC

## 2022-03-08 MED ORDER — ARMC OPHTHALMIC DILATING DROPS
1.0000 "application " | OPHTHALMIC | Status: DC | PRN
  Administered 2022-03-08 (×3): 1 via OPHTHALMIC

## 2022-03-08 SURGICAL SUPPLY — 12 items
CANNULA ANT/CHMB 27G (MISCELLANEOUS) IMPLANT
CANNULA ANT/CHMB 27GA (MISCELLANEOUS) IMPLANT
CATARACT SUITE SIGHTPATH (MISCELLANEOUS) ×2 IMPLANT
FEE CATARACT SUITE SIGHTPATH (MISCELLANEOUS) ×1 IMPLANT
GLOVE SURG ENC TEXT LTX SZ8 (GLOVE) ×2 IMPLANT
GLOVE SURG TRIUMPH 8.0 PF LTX (GLOVE) ×2 IMPLANT
LENS IOL TECNIS EYHANCE 21.5 (Intraocular Lens) ×1 IMPLANT
NDL FILTER BLUNT 18X1 1/2 (NEEDLE) ×1 IMPLANT
NEEDLE FILTER BLUNT 18X 1/2SAF (NEEDLE) ×1
NEEDLE FILTER BLUNT 18X1 1/2 (NEEDLE) ×1 IMPLANT
SYR 3ML LL SCALE MARK (SYRINGE) ×2 IMPLANT
WATER STERILE IRR 250ML POUR (IV SOLUTION) ×2 IMPLANT

## 2022-03-08 NOTE — Anesthesia Procedure Notes (Signed)
Procedure Name: MAC Date/Time: 03/08/2022 11:35 AM Performed by: Jeannene Patella, CRNA Pre-anesthesia Checklist: Patient identified, Emergency Drugs available, Suction available, Timeout performed and Patient being monitored Patient Re-evaluated:Patient Re-evaluated prior to induction Oxygen Delivery Method: Nasal cannula Placement Confirmation: positive ETCO2

## 2022-03-08 NOTE — Transfer of Care (Signed)
Immediate Anesthesia Transfer of Care Note  Patient: Barry [name]  Procedure(s) Performed: CATARACT EXTRACTION PHACO AND INTRAOCULAR LENS PLACEMENT (IOC) LEFT (Left)  Patient Location: PACU  Anesthesia Type: MAC  Level of Consciousness: awake, alert  and patient cooperative  Airway and Oxygen Therapy: Patient Spontanous Breathing and Patient connected to supplemental oxygen  Post-op Assessment: Post-op Vital signs reviewed, Patient's Cardiovascular Status Stable, Respiratory Function Stable, Patent Airway and No signs of Nausea or vomiting  Post-op Vital Signs: Reviewed and stable  Complications: No notable events documented.

## 2022-03-08 NOTE — H&P (Signed)
[facility]   Primary Care Physician:  Olin Hauser, DO Ophthalmologist: Dr. George Ina  Pre-Procedure History & Physical: HPI:  Barry [name] is a 76 y.o. male here for cataract surgery.   Past Medical History:  Diagnosis Date   Arthritis    BPH (benign prostatic hypertrophy)    Coronary artery disease    Dyspnea    d/t heart problem.  worsens in hot or cold extremes   GERD (gastroesophageal reflux disease)    Hypertension    Myocardial infarction [facility])    April 2019. 1 stent placed   Wears dentures    partial upper    Past Surgical History:  Procedure Laterality Date   CATARACT EXTRACTION W/PHACO Right 04/12/2016   Procedure: CATARACT EXTRACTION PHACO AND INTRAOCULAR LENS PLACEMENT ([place]);  Surgeon: Birder Robson, MD;  Location: [facility] ORS;  Service: Ophthalmology;  Laterality: Right;  [place]' 01:05AP% 26.6CDE 17.54fuid pack lot # [id number]   CORONARY/GRAFT ACUTE MI REVASCULARIZATION N/A 01/16/2018   Procedure: Coronary/Graft Acute MI Revascularization;  Surgeon: ENelva Bush MD;  [facility];  Service: Cardiovascular;  Laterality: N/A;   EYE SURGERY     KNEE SURGERY Right 09/21/2012   arthroscopy   LEFT HEART CATH AND CORONARY ANGIOGRAPHY N/A 01/16/2018   Procedure: LEFT HEART CATH AND CORONARY ANGIOGRAPHY;  Surgeon: ENelva Bush MD;  [facility];  Service: Cardiovascular;  Laterality: N/A;   TOTAL KNEE ARTHROPLASTY Right 05/15/2019   Procedure: TOTAL KNEE ARTHROPLASTY;  Surgeon: BLovell Sheehan MD;  Location: [facility] ORS;  Service: Orthopedics;  Laterality: Right;    Prior to Admission medications   Medication Sig Start Date End Date Taking? Authorizing Provider  atorvastatin (LIPITOR) 80 MG tablet Take 1 tablet (80 mg total) by mouth daily at 6 PM. 10/19/21  Yes Gollan, TKathlene November MD  carvedilol (COREG) 12.5 MG tablet Take 1 tablet (12.5 mg total) by mouth 2 (two) times daily. Patient taking differently: Take  3.125 mg by mouth 2 (two) times daily. 10/19/21  Yes Gollan, TKathlene November MD  Famotidine (PEPCID AC PO) Take 1 tablet by mouth as needed.   Yes [provider]  finasteride (PROSCAR) 5 MG tablet Take 1 tablet (5 mg total) by mouth daily. 03/02/22  Yes Stoioff, SRonda Fairly MD  fluticasone (FLONASE) 50 MCG/ACT nasal spray Place 2 sprays into both nostrils daily. Use for 4-6 weeks then stop and use seasonally or as needed. 08/20/21  Yes Karamalegos, ADevonne Doughty DO  tamsulosin (FLOMAX) 0.4 MG CAPS capsule Take 1 capsule (0.4 mg total) by mouth daily. 02/18/22  Yes Karamalegos, ADevonne Doughty DO  ticagrelor (BRILINTA) 60 MG TABS tablet Take 1 tablet (60 mg total) by mouth 2 (two) times daily. 10/19/21  Yes GMinna Merritts MD  valsartan (DIOVAN) 40 MG tablet Take 1 tablet (40 mg total) by mouth daily. 10/19/21  Yes GMinna Merritts MD    Allergies as of 02/14/2022 - Review Complete 10/19/2021  Allergen Reaction Noted   Lisinopril Cough 01/29/2018    Family History  Problem Relation Age of Onset   Diabetes Mother    Stroke Mother    Prostate cancer Brother    Melanoma Brother    Hyperlipidemia Paternal Aunt     Social History   Socioeconomic History   Marital status: Married    Spouse name: [name]  Number of children: Not on file   Years of education: 12th grade   Highest education level: High school graduate  Occupational History   Occupation: retired  Tobacco Use   Smoking status: Former    Types: Cigars    Quit date: 05/07/1974    Years since quitting: 47.8   Smokeless tobacco: Former    Types: Chew    Quit date: 10/17/1993  Vaping Use   Vaping Use: Never used  Substance and Sexual Activity   Alcohol use: No   Drug use: No   Sexual activity: Not on file  Other Topics Concern   Not on file  Social History Narrative   Not on file   Social Determinants of Health   Financial Resource Strain: Low Risk    Difficulty of Paying Living Expenses: Not hard at all  Food  Insecurity: No Food Insecurity   Worried About Charity fundraiser in the Last Year: Never true   [place] in the Last Year: Never true  Transportation Needs: No Transportation Needs   Lack of Transportation (Medical): No   Lack of Transportation (Non-Medical): No  Physical Activity: Insufficiently Active   Days of Exercise per Week: 2 days   Minutes of Exercise per Session: 20 min  Stress: No Stress Concern Present   Feeling of Stress : Not at all  Social Connections: Moderately Integrated   Frequency of Communication with Friends and Family: Three times a week   Frequency of Social Gatherings with Friends and Family: Once a week   Attends Religious Services: More than 4 times per year   Active Member of [employer] or Organizations: No   Attends Music therapist: Never   Marital Status: Married  Human resources officer Violence: Not At Risk   Fear of Current or Ex-Partner: No   Emotionally Abused: No   Physically Abused: No   Sexually Abused: No    Review of Systems: See HPI, otherwise negative ROS  Physical Exam: BP (!) 142/85   Pulse (!) 58   Temp (!) 97.3 F (36.3 C) (Temporal)   Resp 18   Ht '5\' 10"'$  (1.778 m)   Wt 78.5 kg   SpO2 99%   BMI 24.82 kg/m  General:   Alert, cooperative in NAD Head:  Normocephalic and atraumatic. Respiratory:  Normal work of breathing. Cardiovascular:  RRR  Impression/Plan: Barry [name] is here for cataract surgery.  Risks, benefits, limitations, and alternatives regarding cataract surgery have been reviewed with the patient.  Questions have been answered.  All parties agreeable.   Birder Robson, MD  03/08/2022, 11:23 AM

## 2022-03-08 NOTE — Anesthesia Postprocedure Evaluation (Signed)
Anesthesia Post Note  Patient: Barry [name]  Procedure(s) Performed: CATARACT EXTRACTION PHACO AND INTRAOCULAR LENS PLACEMENT (IOC) LEFT 22.80 01:43.8 (Left: Eye)     Patient location during evaluation: PACU Anesthesia Type: MAC Level of consciousness: awake and alert Pain management: pain level controlled Vital Signs Assessment: post-procedure vital signs reviewed and stable Respiratory status: spontaneous breathing, nonlabored ventilation and respiratory function stable Cardiovascular status: stable and blood pressure returned to baseline Postop Assessment: no apparent nausea or vomiting Anesthetic complications: no   No notable events documented.  April Manson

## 2022-03-08 NOTE — Anesthesia Preprocedure Evaluation (Signed)
Anesthesia Evaluation  Patient identified by MRN, date of birth, ID band Patient awake    Reviewed: Allergy & Precautions, H&P , NPO status , Patient's Chart, lab work & pertinent test results, reviewed documented beta blocker date and time   Airway Mallampati: II  TM Distance: >3 FB Neck ROM: full    Dental no notable dental hx.    Pulmonary neg pulmonary ROS, former smoker,    Pulmonary exam normal breath sounds clear to auscultation       Cardiovascular Exercise Tolerance: Good hypertension, + Past MI and + Cardiac Stents  negative cardio ROS   Rhythm:regular Rate:Normal     Neuro/Psych negative neurological ROS  negative psych ROS   GI/Hepatic negative GI ROS, Neg liver ROS, GERD  ,  Endo/Other  negative endocrine ROSdiabetes, Type 2  Renal/GU negative Renal ROS   BPH negative genitourinary   Musculoskeletal  (+) Arthritis , Osteoarthritis,    Abdominal   Peds  Hematology negative hematology ROS (+)   Anesthesia Other Findings   Reproductive/Obstetrics negative OB ROS                             Anesthesia Physical Anesthesia Plan  ASA: 3  Anesthesia Plan: MAC   Post-op Pain Management:    Induction:   PONV Risk Score and Plan: 1 and TIVA, Midazolam and Treatment may vary due to age or medical condition  Airway Management Planned:   Additional Equipment:   Intra-op Plan:   Post-operative Plan:   Informed Consent: I have reviewed the patients History and Physical, chart, labs and discussed the procedure including the risks, benefits and alternatives for the proposed anesthesia with the patient or authorized representative who has indicated his/her understanding and acceptance.     Dental Advisory Given  Plan Discussed with: CRNA  Anesthesia Plan Comments:         Anesthesia Quick Evaluation

## 2022-03-08 NOTE — Op Note (Signed)
PREOPERATIVE DIAGNOSIS:  Nuclear sclerotic cataract of the left eye.   POSTOPERATIVE DIAGNOSIS:  Nuclear sclerotic cataract of the left eye.   OPERATIVE PROCEDURE:ORPROCALL@   SURGEON:  Birder Robson, MD.   ANESTHESIA:  Anesthesiologist: April Manson, MD CRNA: Jeannene Patella, CRNA  1.      Managed anesthesia care. 2.     0.38m of Shugarcaine was instilled following the paracentesis   COMPLICATIONS:  None.   TECHNIQUE:   Stop and chop   DESCRIPTION OF PROCEDURE:  The patient was examined and consented in the preoperative holding area where the aforementioned topical anesthesia was applied to the left eye and then brought back to the Operating Room where the left eye was prepped and draped in the usual sterile ophthalmic fashion and a lid speculum was placed. A paracentesis was created with the side port blade and the anterior chamber was filled with viscoelastic. A near clear corneal incision was performed with the steel keratome. A continuous curvilinear capsulorrhexis was performed with a cystotome followed by the capsulorrhexis forceps. Hydrodissection and hydrodelineation were carried out with BSS on a blunt cannula. The lens was removed in a stop and chop  technique and the remaining cortical material was removed with the irrigation-aspiration handpiece. The capsular bag was inflated with viscoelastic and the Technis [id number] lens was placed in the capsular bag without complication. The remaining viscoelastic was removed from the eye with the irrigation-aspiration handpiece. The wounds were hydrated. The anterior chamber was flushed with BSS and the eye was inflated to physiologic pressure. 0.143mVigamox was placed in the anterior chamber. The wounds were found to be water tight. The eye was dressed with Combigan. The patient was given protective glasses to wear throughout the day and a shield with which to sleep tonight. The patient was also given drops with which to begin a drop  regimen today and will follow-up with me in one day. Implant Name Type Inv. Item Serial No. Manufacturer Lot No. LRB No. Used Action  LENS IOL TECNIS EYHANCE 21.5 - [id number] Lens LENS IOL TECNIS EYHANCE 21.5 [id number]  Left 1 Implanted    Procedure(s): CATARACT EXTRACTION PHACO AND INTRAOCULAR LENS PLACEMENT (IOC) LEFT 22.80 01:43.8 (Left)  Electronically signed: WiBirder Robson/23/2023 11:51 AM

## 2022-03-09 ENCOUNTER — Encounter: Payer: Self-pay | Admitting: Ophthalmology

## 2022-03-30 DIAGNOSIS — Z961 Presence of intraocular lens: Secondary | ICD-10-CM | POA: Diagnosis not present

## 2022-08-17 ENCOUNTER — Other Ambulatory Visit: Payer: PPO

## 2022-08-17 ENCOUNTER — Other Ambulatory Visit: Payer: Self-pay

## 2022-08-17 DIAGNOSIS — Z0000 Encounter for general adult medical examination without abnormal findings: Secondary | ICD-10-CM

## 2022-08-17 DIAGNOSIS — E1169 Type 2 diabetes mellitus with other specified complication: Secondary | ICD-10-CM | POA: Diagnosis not present

## 2022-08-17 DIAGNOSIS — I10 Essential (primary) hypertension: Secondary | ICD-10-CM

## 2022-08-17 DIAGNOSIS — N401 Enlarged prostate with lower urinary tract symptoms: Secondary | ICD-10-CM

## 2022-08-17 DIAGNOSIS — E785 Hyperlipidemia, unspecified: Secondary | ICD-10-CM | POA: Diagnosis not present

## 2022-08-17 DIAGNOSIS — R350 Frequency of micturition: Secondary | ICD-10-CM | POA: Diagnosis not present

## 2022-08-18 LAB — CBC WITH DIFFERENTIAL/PLATELET
Absolute Monocytes: 527 cells/uL (ref 200–950)
Basophils Absolute: 62 cells/uL (ref 0–200)
Basophils Relative: 1 %
Eosinophils Absolute: 192 cells/uL (ref 15–500)
Eosinophils Relative: 3.1 %
HCT: 37.3 % — ABNORMAL LOW (ref 38.5–50.0)
Hemoglobin: 13 g/dL — ABNORMAL LOW (ref 13.2–17.1)
Lymphs Abs: 1401 cells/uL (ref 850–3900)
MCH: 31 pg (ref 27.0–33.0)
MCHC: 34.9 g/dL (ref 32.0–36.0)
MCV: 89 fL (ref 80.0–100.0)
MPV: 10.4 fL (ref 7.5–12.5)
Monocytes Relative: 8.5 %
Neutro Abs: 4018 cells/uL (ref 1500–7800)
Neutrophils Relative %: 64.8 %
Platelets: 184 10*3/uL (ref 140–400)
RBC: 4.19 10*6/uL — ABNORMAL LOW (ref 4.20–5.80)
RDW: 12.2 % (ref 11.0–15.0)
Total Lymphocyte: 22.6 %
WBC: 6.2 10*3/uL (ref 3.8–10.8)

## 2022-08-18 LAB — COMPLETE METABOLIC PANEL WITH GFR
AG Ratio: 1.9 (calc) (ref 1.0–2.5)
ALT: 14 U/L (ref 9–46)
AST: 17 U/L (ref 10–35)
Albumin: 4.2 g/dL (ref 3.6–5.1)
Alkaline phosphatase (APISO): 82 U/L (ref 35–144)
BUN: 15 mg/dL (ref 7–25)
CO2: 24 mmol/L (ref 20–32)
Calcium: 8.7 mg/dL (ref 8.6–10.3)
Chloride: 109 mmol/L (ref 98–110)
Creat: 0.84 mg/dL (ref 0.70–1.28)
Globulin: 2.2 g/dL (calc) (ref 1.9–3.7)
Glucose, Bld: 122 mg/dL — ABNORMAL HIGH (ref 65–99)
Potassium: 4.3 mmol/L (ref 3.5–5.3)
Sodium: 142 mmol/L (ref 135–146)
Total Bilirubin: 0.7 mg/dL (ref 0.2–1.2)
Total Protein: 6.4 g/dL (ref 6.1–8.1)
eGFR: 90 mL/min/{1.73_m2} (ref >=60)

## 2022-08-18 LAB — HEMOGLOBIN A1C
Hgb A1c MFr Bld: 6.7 % of total Hgb — ABNORMAL HIGH (ref <5.7)
Mean Plasma Glucose: 146 mg/dL
eAG (mmol/L): 8.1 mmol/L

## 2022-08-18 LAB — LIPID PANEL
Cholesterol: 94 mg/dL (ref <200)
HDL: 38 mg/dL — ABNORMAL LOW (ref >=40)
LDL Cholesterol (Calc): 43 mg/dL (calc)
Non-HDL Cholesterol (Calc): 56 mg/dL (calc) (ref <130)
Total CHOL/HDL Ratio: 2.5 (calc) (ref <5.0)
Triglycerides: 52 mg/dL (ref <150)

## 2022-08-18 LAB — PSA: PSA: 0.75 ng/mL (ref <=4.00)

## 2022-08-18 LAB — TSH: TSH: 1.18 mIU/L (ref 0.40–4.50)

## 2022-08-23 ENCOUNTER — Ambulatory Visit (INDEPENDENT_AMBULATORY_CARE_PROVIDER_SITE_OTHER): Payer: PPO | Admitting: Family Medicine

## 2022-08-23 ENCOUNTER — Encounter: Payer: Self-pay | Admitting: Family Medicine

## 2022-08-23 VITALS — BP 136/61 | HR 63 | Ht 70.0 in | Wt 175.0 lb

## 2022-08-23 DIAGNOSIS — N401 Enlarged prostate with lower urinary tract symptoms: Secondary | ICD-10-CM

## 2022-08-23 DIAGNOSIS — I480 Paroxysmal atrial fibrillation: Secondary | ICD-10-CM

## 2022-08-23 DIAGNOSIS — I10 Essential (primary) hypertension: Secondary | ICD-10-CM | POA: Diagnosis not present

## 2022-08-23 DIAGNOSIS — J019 Acute sinusitis, unspecified: Secondary | ICD-10-CM | POA: Diagnosis not present

## 2022-08-23 DIAGNOSIS — Z0000 Encounter for general adult medical examination without abnormal findings: Secondary | ICD-10-CM

## 2022-08-23 DIAGNOSIS — E785 Hyperlipidemia, unspecified: Secondary | ICD-10-CM | POA: Diagnosis not present

## 2022-08-23 DIAGNOSIS — E1169 Type 2 diabetes mellitus with other specified complication: Secondary | ICD-10-CM

## 2022-08-23 DIAGNOSIS — R350 Frequency of micturition: Secondary | ICD-10-CM

## 2022-08-23 MED ORDER — FLUTICASONE PROPIONATE 50 MCG/ACT NA SUSP: 2.0000 | Freq: Every day | NASAL | 3 refills | Status: DC

## 2022-08-23 NOTE — Patient Instructions (Addendum)
Thank you for coming to the office today.  Last Tetanus vaccine in 2012, you are due for Tdap vaccine at pharmacy if interested.  Future Shingrix vaccine if interested at pharmacy, 2 doses, 2-6 months apart.  PSA number has improved to 0.75 (previously 1.05 and 1.19)  We can remain off of Finasteride medication if not needed  Keep on Tamsulosin  Updated Flu Shot  Urine test today  Recent Labs    02/14/22 0848 08/17/22 0851  HGBA1C 6.6* 6.7*   Cholesterol is very well controlled  Keep on current.  Please schedule a Follow-up Appointment to: Return in about 6 months (around 02/21/2023) for 6 month DM A1c, HTN, BPH updates.  If you have any other questions or concerns, please feel free to call the office or send a message through [place]. You may also schedule an earlier appointment if necessary.  Additionally, you may be receiving a survey about your experience at our office within a few days to 1 week by e-mail or mail. We value your feedback.  Nobie Putnam, DO [place]

## 2022-08-23 NOTE — Assessment & Plan Note (Signed)
Seen by Urology PSA trended down, they started finasteride but he did not take for long Fam hx brother has prostate CA age 76-70 - Last DRE mild enlarged prostate, unremarkable (07/10/18)  Plan: 1. Continue tamsulosin 0.'4mg'$  daily refilled

## 2022-08-23 NOTE — Assessment & Plan Note (Signed)
Well-controlled DM with A1c 6.7 at goal, stable No known complications or hypoglycemia. Complications - other including hyperlipidemia and vascular disease CAD, GERD - increases risk of future cardiovascular complications   Plan 1. No DM medication required. Diet controlled 2. Encourage improved lifestyle - low carb, low sugar diet, reduce portion size, continue improving regular exercise 3. Check CBG , bring log to next visit for review 4. Continue ASA, ARB, Statin DM Foot  Yearly exam w/ [place]

## 2022-08-23 NOTE — Assessment & Plan Note (Signed)
Stable Followed by Cardiology On Brilinta now, and OFF Aspirin since 10/2021 On rate control

## 2022-08-23 NOTE — Assessment & Plan Note (Signed)
Controlled cholesterol on statin and lifestyle Last lipid panel 07/2021 Known CAD Off Aspirin  Plan: 1. Continue current meds - Atorvastating '80mg'$  daily 2. Continue Brilinta for primary ASCVD risk reduction 3. Encourage improved lifestyle - low carb/cholesterol, reduce portion size, continue improving regular exercise

## 2022-08-23 NOTE — Progress Notes (Signed)
Subjective:    Patient ID: Barry [name], male    DOB: [birth year], 76 y.o.   MRN: [id number]  Barry [name] is a 76 y.o. male presenting on 08/23/2022 for Annual Exam   HPI  Here for Annual Physical and Lab Review  CHRONIC DM, Type 2: A1c 6.6, last time 6.7 Not checking CBG at home Meds: None - diet controlled Currently on Losartan Lifestyle: - Weight down 5 lbs - Diet (improved DM diet) - Exercise (active Denies hypoglycemia   CHRONIC HTN: Reports no new concerns. Home BP readings normal controlled, similar to before Current Meds - Carvedilol 12.85m BID, Losartan 215mdaily   Reports good compliance, took meds today. Tolerating well, w/o complaints.     BPH with LUTS / Prostate Cancer Screening PSA trend see below, prior PSA trend 1.05 up to 1.19, now improved trend to 0.75 (07/2022) - He saw Dr StBernardo HeaterUA earlier 2023 and put on Finasteride but he did not take it long, side effect greasy skin, and didn't feel he needed it. The urologist was not worried and didn't think he needed to follow it - He is doing well on Flomax 0.44m70maily but tried x 2 dose in past without change Admits some worse dribbling and LUTS - Prior treatments including Rapaflo, Silodosin Denies hematuria, dysuria, abdominal pain or flank pain   CAD, history of STEMI / PAF / HLD Followed by Cardiology, Dr GolRockey Situst visit 10/2021 taken off Aspirin 81 On Brilinta, Carvedilol, Atorvastatin Denies any chest pain, dyspnea, anginal symptoms, palpitations   S/p R knee TKR History of Right Knee Pain, Arthritis S/p surgery 04/2019    Health Maintenance: PSA 0.75 (07/2022) - previous 1.19. from prior 1.05 last 6 months. Prior to that 0.6. His brother has prostate cancer dx age 68.64Flu Shot updated 07/22/22  Considering Shingrix / TDap     08/23/2022   11:12 AM 02/18/2022   10:09 AM 10/01/2021    9:00 AM  Depression screen PHQ 2/9  Decreased Interest 0 0 0  Down, Depressed, Hopeless 0  0 0  PHQ - 2 Score 0 0 0  Altered sleeping 1 0 0  Tired, decreased energy 1 0 0  Change in appetite 0 0 0  Feeling bad or failure about yourself  0 0 0  Trouble concentrating 0 0 0  Moving slowly or fidgety/restless 1 0 1  Suicidal thoughts 0 0 0  PHQ-9 Score 3 0 1  Difficult doing work/chores Not difficult at all Not difficult at all Not difficult at all    Past Medical History:  Diagnosis Date   Arthritis    BPH (benign prostatic hypertrophy)    Coronary artery disease    Dyspnea    d/t heart problem.  worsens in hot or cold extremes   GERD (gastroesophageal reflux disease)    Hypertension    [facility] 1 stent placed   Wears dentures    partial upper   Past Surgical History:  Procedure Laterality Date   CATARACT EXTRACTION W/PHACO Right 04/12/2016   Procedure: CATARACT EXTRACTION PHACO AND INTRAOCULAR LENS PLACEMENT ([place]geon: WilBirder RobsonD;  Location: [facility] ORS;  Service: Ophthalmology;  Laterality: Right;  [place]1:05AP% 26.6CDE [id number] pack lot # [id number] CATARACT EXTRACTION W/PHACO Left 03/08/2022   Procedure: CATARACT EXTRACTION PHACO AND INTRAOCULAR LENS PLACEMENT (IOC) LEFT 22.80 01:43.8;  Surgeon: PorfBirder Robson;  [place];  Laterality: Left;   CORONARY/GRAFT ACUTE MI REVASCULARIZATION N/A 01/16/2018   Procedure: Coronary/Graft Acute MI Revascularization;  Surgeon: Nelva Bush, MD;  Location: [place] [facility];  Service: Cardiovascular;  Laterality: N/A;   EYE SURGERY     KNEE SURGERY Right 09/21/2012   arthroscopy   LEFT HEART CATH AND CORONARY ANGIOGRAPHY N/A 01/16/2018   Procedure: LEFT HEART CATH AND CORONARY ANGIOGRAPHY;  Surgeon: Nelva Bush, MD;  Location: [place] [facility];  Service: Cardiovascular;  Laterality: N/A;   TOTAL KNEE ARTHROPLASTY Right 05/15/2019   Procedure: TOTAL KNEE ARTHROPLASTY;  Surgeon: Lovell Sheehan, MD;  Location: [facility] ORS;   Service: Orthopedics;  Laterality: Right;   Social History   Socioeconomic History   Marital status: Married    Spouse name: Baldo Ash   Number of children: Not on file   Years of education: 12th grade   Highest education level: High school graduate  Occupational History   Occupation: retired  Tobacco Use   Smoking status: Former    Types: Cigars    Quit date: 05/07/1974    Years since quitting: 48.3   Smokeless tobacco: Former    Types: Chew    Quit date: 10/17/1993  Vaping Use   Vaping Use: Never used  Substance and Sexual Activity   Alcohol use: No   Drug use: No   Sexual activity: Not on file  Other Topics Concern   Not on file  Social History Narrative   Not on file   Social Determinants of Health   Financial Resource Strain: Low Risk  (10/01/2021)   Overall Financial Resource Strain (CARDIA)    Difficulty of Paying Living Expenses: Not hard at all  Food Insecurity: No [place] (10/01/2021)   Hunger Vital Sign    Worried About Running Out of Food in the Last Year: Never true    [place] in the Last Year: Never true  Transportation Needs: No Transportation Needs (10/01/2021)   PRAPARE - Hydrologist (Medical): No    Lack of Transportation (Non-Medical): No  Physical Activity: Insufficiently Active (10/01/2021)   Exercise Vital Sign    Days of Exercise per Week: 2 days    Minutes of Exercise per Session: 20 min  Stress: No Stress Concern Present (10/01/2021)   [place]    Feeling of Stress : Not at all  Social Connections: Moderately Integrated (10/01/2021)   Social Connection and Isolation Panel [NHANES]    Frequency of Communication with Friends and Family: Three times a week    Frequency of Social Gatherings with Friends and Family: Once a week    Attends Religious Services: More than 4 times per year    Active Member of [employer] or Organizations: No     Attends Archivist Meetings: Never    Marital Status: Married  Human resources officer Violence: Not At Risk (10/01/2021)   Humiliation, Afraid, Rape, and Kick questionnaire    Fear of Current or Ex-Partner: No    Emotionally Abused: No    Physically Abused: No    Sexually Abused: No   Family History  Problem Relation Age of Onset   Diabetes Mother    Stroke Mother    Prostate cancer Brother    Melanoma Brother    Hyperlipidemia Paternal Aunt    Current Outpatient Medications on File Prior to Visit  Medication Sig   atorvastatin (LIPITOR) 80 MG tablet Take 1 tablet (80  mg total) by mouth daily at 6 PM.   carvedilol (COREG) 3.125 MG tablet Take 3.125 mg by mouth 2 (two) times daily with a meal.   Famotidine (PEPCID AC PO) Take 1 tablet by mouth as needed.   tamsulosin (FLOMAX) 0.4 MG CAPS capsule Take 1 capsule (0.4 mg total) by mouth daily.   ticagrelor (BRILINTA) 60 MG TABS tablet Take 1 tablet (60 mg total) by mouth 2 (two) times daily.   valsartan (DIOVAN) 40 MG tablet Take 1 tablet (40 mg total) by mouth daily.   No current facility-administered medications on file prior to visit.    Review of Systems  Constitutional:  Negative for activity change, appetite change, chills, diaphoresis, fatigue and fever.  HENT:  Negative for congestion and hearing loss.   Eyes:  Negative for visual disturbance.  Respiratory:  Negative for cough, chest tightness, shortness of breath and wheezing.   Cardiovascular:  Negative for chest pain, palpitations and leg swelling.  Gastrointestinal:  Negative for abdominal pain, constipation, diarrhea, nausea and vomiting.  Genitourinary:  Negative for dysuria, frequency and hematuria.  Musculoskeletal:  Negative for arthralgias and neck pain.  Skin:  Negative for rash.  Neurological:  Negative for dizziness, weakness, light-headedness, numbness and headaches.  Hematological:  Negative for adenopathy.  Psychiatric/Behavioral:  Negative for  behavioral problems, dysphoric mood and sleep disturbance.    Per HPI unless specifically indicated above      Objective:    BP 136/61   Pulse 63   Ht _0  (1.778 m)   Wt 175 lb (79.4 kg)   SpO2 98%   BMI 25.11 kg/m   Wt Readings from Last 3 Encounters:  08/23/22 175 lb (79.4 kg)  03/08/22 173 lb (78.5 kg)  03/02/22 175 lb (79.4 kg)    Physical Exam Vitals and nursing note reviewed.  Constitutional:      General: He is not in acute distress.    Appearance: He is well-developed. He is not diaphoretic.     Comments: Well-appearing, comfortable, cooperative  HENT:     Head: Normocephalic and atraumatic.  Eyes:     General:        Right eye: No discharge.        Left eye: No discharge.     Conjunctiva/sclera: Conjunctivae normal.     Pupils: Pupils are equal, round, and reactive to light.  Neck:     Thyroid: No thyromegaly.     Vascular: No carotid bruit.  Cardiovascular:     Rate and Rhythm: Normal rate and regular rhythm.     Pulses: Normal pulses.     Heart sounds: Normal heart sounds. No murmur heard. Pulmonary:     Effort: Pulmonary effort is normal. No respiratory distress.     Breath sounds: Normal breath sounds. No wheezing or rales.  Abdominal:     General: Bowel sounds are normal. There is no distension.     Palpations: Abdomen is soft. There is no mass.     Tenderness: There is no abdominal tenderness.  Musculoskeletal:        General: No tenderness. Normal range of motion.     Cervical back: Normal range of motion and neck supple.     Right lower leg: No edema.     Left lower leg: No edema.     Comments: Upper / Lower Extremities: - Normal muscle tone, strength bilateral upper extremities 5/5, lower extremities 5/5  Lymphadenopathy:     Cervical: No cervical adenopathy.  Skin:  General: Skin is warm and dry.     Findings: No erythema or rash.  Neurological:     Mental Status: He is alert and oriented to person, place, and time.     Comments:  Distal sensation intact to light touch all extremities  Psychiatric:        Mood and Affect: Mood normal.        Behavior: Behavior normal.        Thought Content: Thought content normal.     Comments: Well groomed, good eye contact, normal speech and thoughts     Diabetic Foot Exam - Simple   Simple Foot Form Diabetic Foot exam was performed with the following findings: Yes 08/23/2022 11:27 AM  Visual Inspection No deformities, no ulcerations, no other skin breakdown bilaterally: Yes Sensation Testing Intact to touch and monofilament testing bilaterally: Yes Pulse Check Posterior Tibialis and Dorsalis pulse intact bilaterally: Yes Comments    Recent Labs    02/14/22 0848 08/17/22 0851  HGBA1C 6.6* 6.7*     Results for orders placed or performed in visit on 08/17/22  TSH  Result Value Ref Range   TSH 1.18 0.40 - 4.50 mIU/L  PSA  Result Value Ref Range   PSA 0.75 < OR = 4.00 ng/mL  Hemoglobin A1c  Result Value Ref Range   Hgb A1c MFr Bld 6.7 (H) <5.7 % of total Hgb   Mean Plasma Glucose 146 mg/dL   eAG (mmol/L) 8.1 mmol/L  Lipid panel  Result Value Ref Range   Cholesterol 94 <200 mg/dL   HDL 38 (L) > OR = 40 mg/dL   Triglycerides 52 <150 mg/dL   LDL Cholesterol (Calc) 43 mg/dL (calc)   Total CHOL/HDL Ratio 2.5 <5.0 (calc)   Non-HDL Cholesterol (Calc) 56 <130 mg/dL (calc)  CBC with Differential/Platelet  Result Value Ref Range   WBC 6.2 3.8 - 10.8 Thousand/uL   RBC 4.19 (L) 4.20 - 5.80 Million/uL   Hemoglobin 13.0 (L) 13.2 - 17.1 g/dL   HCT 37.3 (L) 38.5 - 50.0 %   MCV 89.0 80.0 - 100.0 fL   MCH 31.0 27.0 - 33.0 pg   MCHC 34.9 32.0 - 36.0 g/dL   RDW 12.2 11.0 - 15.0 %   Platelets 184 140 - 400 Thousand/uL   MPV 10.4 7.5 - 12.5 fL   Neutro Abs 4,018 1,500 - 7,800 cells/uL   Lymphs Abs 1,401 850 - 3,900 cells/uL   Absolute Monocytes 527 200 - 950 cells/uL   Eosinophils Absolute 192 15 - 500 cells/uL   Basophils Absolute 62 0 - 200 cells/uL   Neutrophils  Relative % 64.8 %   Total Lymphocyte 22.6 %   Monocytes Relative 8.5 %   Eosinophils Relative 3.1 %   Basophils Relative 1.0 %  COMPLETE METABOLIC PANEL WITH GFR  Result Value Ref Range   Glucose, Bld 122 (H) 65 - 99 mg/dL   BUN 15 7 - 25 mg/dL   Creat 0.84 0.70 - 1.28 mg/dL   eGFR 90 > OR = 60 mL/min/1.38m   BUN/Creatinine Ratio SEE NOTE: 6 - 22 (calc)   Sodium 142 135 - 146 mmol/L   Potassium 4.3 3.5 - 5.3 mmol/L   Chloride 109 98 - 110 mmol/L   CO2 24 20 - 32 mmol/L   Calcium 8.7 8.6 - 10.3 mg/dL   Total Protein 6.4 6.1 - 8.1 g/dL   Albumin 4.2 3.6 - 5.1 g/dL   Globulin 2.2 1.9 - 3.7 g/dL (calc)  AG Ratio 1.9 1.0 - 2.5 (calc)   Total Bilirubin 0.7 0.2 - 1.2 mg/dL   Alkaline phosphatase (APISO) 82 35 - 144 U/L   AST 17 10 - 35 U/L   ALT 14 9 - 46 U/L      Assessment & Plan:   Problem List Items Addressed This Visit     Benign prostatic hyperplasia with lower urinary tract symptoms    Seen by Urology PSA trended down, they started finasteride but he did not take for long Fam hx brother has prostate CA age 69-70 - Last DRE mild enlarged prostate, unremarkable (07/10/18)  Plan: 1. Continue tamsulosin 0.1m daily refilled      Essential hypertension    Controlled HTN No known complications  Plan: 1. Continue Carvedilol 3.125 TWICE A DAY - Continue on Valsartan 464mdaily - Encouraged low sodium diet, regular exercise - Monitor BP at home      Relevant Medications   carvedilol (COREG) 3.125 MG tablet   Hyperlipidemia associated with type 2 diabetes mellitus (HCC)    Controlled cholesterol on statin and lifestyle Last lipid panel 07/2021 Known CAD Off Aspirin  Plan: 1. Continue current meds - Atorvastating 8085maily 2. Continue Brilinta for primary ASCVD risk reduction 3. Encourage improved lifestyle - low carb/cholesterol, reduce portion size, continue improving regular exercise      Relevant Medications   carvedilol (COREG) 3.125 MG tablet    Paroxysmal atrial fibrillation (HCC)    Stable Followed by Cardiology On Brilinta now, and OFF Aspirin since 10/2021 On rate control      Relevant Medications   carvedilol (COREG) 3.125 MG tablet   Type 2 diabetes mellitus with other specified complication (HCC)    Well-controlled DM with A1c 6.7 at goal, stable No known complications or hypoglycemia. Complications - other including hyperlipidemia and vascular disease CAD, GERD - increases risk of future cardiovascular complications   Plan 1. No DM medication required. Diet controlled 2. Encourage improved lifestyle - low carb, low sugar diet, reduce portion size, continue improving regular exercise 3. Check CBG , bring log to next visit for review 4. Continue ASA, ARB, Statin DM Foot  Yearly exam w/ [facility]      Relevant Orders   Urine Microalbumin w/creat. ratio   Other Visit Diagnoses     Annual physical exam    -  Primary   Subacute rhinosinusitis       Relevant Medications   fluticasone (FLONASE) 50 MCG/ACT nasal spray       Updated Health Maintenance information Reviewed recent lab results with patient Encouraged improvement to lifestyle with diet and exercise Goal of weight loss   Meds ordered this encounter  Medications   fluticasone (FLONASE) 50 MCG/ACT nasal spray    Sig: Place 2 sprays into both nostrils daily. Use for 4-6 weeks then stop and use seasonally or as needed.    Dispense:  16 g    Refill:  3      Follow up plan: Return in about 6 months (around 02/21/2023) for 6 month DM A1c, HTN, BPH updates.  AleNobie PutnamO [facility] 08/23/2022, 11:15 AM

## 2022-08-23 NOTE — Assessment & Plan Note (Signed)
Controlled HTN No known complications  Plan: 1. Continue Carvedilol 3.125 TWICE A DAY - Continue on Valsartan '40mg'$  daily - Encouraged low sodium diet, regular exercise - Monitor BP at home

## 2022-08-24 LAB — MICROALBUMIN / CREATININE URINE RATIO
Creatinine, Urine: 90 mg/dL (ref 20–320)
Microalb, Ur: <0.2 mg/dL

## 2022-09-12 ENCOUNTER — Encounter: Payer: Self-pay | Admitting: Cardiovascular Disease

## 2022-09-12 ENCOUNTER — Telehealth: Payer: Self-pay | Admitting: Cardiovascular Disease

## 2022-09-12 NOTE — Telephone Encounter (Signed)
Noted- as the patient is asymptomatic and has a follow up appointment scheduled for tomorrow morning at 8:40 am with Dr. Rockey Situ will not place a follow up call to the patient today.

## 2022-09-12 NOTE — Telephone Encounter (Signed)
Patient c/o Palpitations:  High priority if patient c/o lightheadedness, shortness of breath, or chest pain  How long have you had palpitations/irregular HR/ Afib? Are you having the symptoms now?  Per wife, patient feels fine, but BP monitor shows that HR is irregular  Are you currently experiencing lightheadedness, SOB or CP?  No   Do you have a history of afib (atrial fibrillation) or irregular heart rhythm?  No   Have you checked your BP or HR? (document readings if available):  Wife states BP has been fine. They scheduled an appointment for tomorrow, 8:40 AM with Dr. Rockey Situ. She states they plan to bring in BP readings at that time  Are you experiencing any other symptoms?  No

## 2022-09-13 ENCOUNTER — Ambulatory Visit (INDEPENDENT_AMBULATORY_CARE_PROVIDER_SITE_OTHER): Payer: PPO

## 2022-09-13 ENCOUNTER — Telehealth: Payer: Self-pay | Admitting: Cardiovascular Disease

## 2022-09-13 ENCOUNTER — Encounter: Payer: Self-pay | Admitting: Cardiovascular Disease

## 2022-09-13 ENCOUNTER — Ambulatory Visit: Payer: PPO | Attending: Cardiovascular Disease | Admitting: Cardiovascular Disease

## 2022-09-13 VITALS — BP 120/60 | HR 67 | Ht 69.0 in | Wt 182.4 lb

## 2022-09-13 DIAGNOSIS — R002 Palpitations: Secondary | ICD-10-CM | POA: Diagnosis not present

## 2022-09-13 DIAGNOSIS — I493 Ventricular premature depolarization: Secondary | ICD-10-CM | POA: Diagnosis not present

## 2022-09-13 MED ORDER — ATORVASTATIN CALCIUM 80 MG PO TABS: 80.0000 mg | ORAL_TABLET | Freq: Every day | ORAL | 3 refills | Status: DC

## 2022-09-13 MED ORDER — CLOPIDOGREL BISULFATE 75 MG PO TABS: 75.0000 mg | ORAL_TABLET | Freq: Every day | ORAL | 3 refills | Status: DC

## 2022-09-13 MED ORDER — VALSARTAN 40 MG PO TABS: 40.0000 mg | ORAL_TABLET | Freq: Every day | ORAL | 3 refills | Status: DC

## 2022-09-13 NOTE — Progress Notes (Addendum)
Cardiology Office Note  Date:  09/13/2022   ID:  Ellison, Rieth [birth year], MRN [id number]  PCP:  Olin Hauser, DO   Chief Complaint  Patient presents with   Irregular Heart Beat    Patient c/o dry cough and irregular heart beats. Medications reviewed by the patient verbally.     HPI:  76 year old gentleman with history of  CAD, prior PCI to mid RCA 01/2018 hypertension,  hyperlipidemia,  arthritis,  Anxiety  Chronic right knee pain,  Diabetes type 2, HBA1C 6.4 Ejection fraction 45 to 50% April 2019 Intolerance to several blood pressure medications as detailed below EF up to 55 to 60% in 12/20 Atrial fibrillation noted at start of catheterization April 2019, converted to normal sinus rhythm spontaneously who presents for follow-up of his coronary disease,  STEMI April 2019, proximal to mid RCA April 2019  Last seen by myself in clinic January 2023 He reports that BP monitor shows he is having periodic irregular heart rhythm Having PVCs on EKG today In general reports he is asymptomatic when blood pressure cuff reads a regular  Tolerating carvedilol, losartan Difficulty affording Brilinta  Uses his exercise bike at home, feels well without shortness of breath chest pain concerning for angina  Lab work reviewed Total cholesterol 94 LDL 43 A1c 6.7 Hemoglobin 13  Off asa secondary to nose bleeds  Episode of epistaxis, in the ER 10/11/21 Afrin and clip, discharged has follow-up with ENT Seem to happen around the very cold weather around Christmas  History of chronic shortness of breath Prior work-up including echocardiogram December 2020, normal ejection fraction EF 55%  EKG personally reviewed by myself on todays visit Normal sinus rhythm rate 67 bpm no significant ST-T wave changes with PVCs  Other past medical history reviewed Paroxysmal atrial fibrillation noted prior to PCI, converted spontaneously  Echocardiogram 2018 - Left ventricle:  The cavity size was normal. Wall thickness was   increased in a pattern of mild LVH. Systolic function was mildly   reduced. The estimated ejection fraction was in the range of 45%   to 50%. There is hypokinesis of the basal-midinferolateral and   inferior myocardium. Left ventricular diastolic function   parameters were normal for the patient&'s age. - Aortic valve: There was trivial regurgitation. - Right ventricle: The cavity size was normal. Systolic function   was mildly to moderately reduced.  January 16, 2018 chest pain, d/c on 01/18/2018 Cardiac catheterization performed inferior STEMI thrombotic occlusion to the proximal RCA moderate nonobstructive coronary disease of LAD and left circumflex Proximal to mid LAD is aneurysmal Drug-eluting stent placed to proximal to mid RCA 3.0 x 38 mm  Peak troponin 54 on January 17, 2018  Previous side effects to various medications Amlodipine, he developed swelling HCTZ, reported having dry skin, dry mouth Lisinopril, developed a cough Atenolol: did not work Coreg: ok so far  Family history of diabetes, MI Mom with CVA Dad with alzheimers  PMH:   has a past medical history of Arthritis, BPH (benign prostatic hypertrophy), Coronary artery disease, Dyspnea, GERD (gastroesophageal reflux disease), Hypertension, Myocardial infarction ([place]), and Wears dentures.  PSH:    Past Surgical History:  Procedure Laterality Date   CATARACT EXTRACTION W/PHACO Right 04/12/2016   Procedure: CATARACT EXTRACTION PHACO AND INTRAOCULAR LENS PLACEMENT (IOC);  Surgeon: Birder Robson, MD;  Location: [facility];  Service: Ophthalmology;  Laterality: Right;  [place]' 01:05AP% 26.6CDE 17.68fuid pack lot # [id number]   CATARACT EXTRACTION W/PHACO Left 03/08/2022   Procedure: CATARACT  EXTRACTION PHACO AND INTRAOCULAR LENS PLACEMENT (IOC) LEFT 22.80 01:43.8;  Surgeon: Birder Robson, MD;  Location: [facility];  Service: Ophthalmology;  Laterality: Left;    CORONARY/GRAFT ACUTE MI REVASCULARIZATION N/A 01/16/2018   Procedure: Coronary/Graft Acute MI Revascularization;  Surgeon: Nelva Bush, MD;  Location: [place] [facility];  Service: Cardiovascular;  Laterality: N/A;   EYE SURGERY     KNEE SURGERY Right 09/21/2012   arthroscopy   LEFT HEART CATH AND CORONARY ANGIOGRAPHY N/A 01/16/2018   Procedure: LEFT HEART CATH AND CORONARY ANGIOGRAPHY;  Surgeon: Nelva Bush, MD;  Location: [place] [facility];  Service: Cardiovascular;  Laterality: N/A;   TOTAL KNEE ARTHROPLASTY Right 05/15/2019   Procedure: TOTAL KNEE ARTHROPLASTY;  Surgeon: Lovell Sheehan, MD;  Location: [facility];  Service: Orthopedics;  Laterality: Right;    Current Outpatient Medications  Medication Sig Dispense Refill   atorvastatin (LIPITOR) 80 MG tablet Take 1 tablet (80 mg total) by mouth daily at 6 PM. 90 tablet 3   carvedilol (COREG) 12.5 MG tablet Take 12.5 mg by mouth 2 (two) times daily with a meal.     Famotidine (PEPCID AC PO) Take 1 tablet by mouth as needed.     fluticasone (FLONASE) 50 MCG/ACT nasal spray Place 2 sprays into both nostrils daily. Use for 4-6 weeks then stop and use seasonally or as needed. 16 g 3   tamsulosin (FLOMAX) 0.4 MG CAPS capsule Take 1 capsule (0.4 mg total) by mouth daily. 90 capsule 3   ticagrelor (BRILINTA) 60 MG TABS tablet Take 1 tablet (60 mg total) by mouth 2 (two) times daily. 180 tablet 3   valsartan (DIOVAN) 40 MG tablet Take 1 tablet (40 mg total) by mouth daily. 90 tablet 3   No current facility-administered medications for this visit.    Allergies:   Lisinopril   Social History:  The patient  reports that he quit smoking about 48 years ago. His smoking use included cigars. He quit smokeless tobacco use about 28 years ago.  His smokeless tobacco use included chew. He reports that he does not drink alcohol and does not use drugs.   Family History:   family history includes Diabetes in his mother; Hyperlipidemia in his  paternal aunt; Melanoma in his brother; Prostate cancer in his brother; Stroke in his mother.    Review of Systems: Review of Systems  Constitutional: Negative.   HENT: Negative.    Respiratory: Negative.    Cardiovascular:  Positive for palpitations.  Gastrointestinal: Negative.   Musculoskeletal: Negative.   Neurological: Negative.   Psychiatric/Behavioral: Negative.    All other systems reviewed and are negative.   PHYSICAL EXAM: VS:  BP 120/60 (BP Location: Left Arm, Patient Position: Sitting, Cuff Size: Normal)   Pulse 67   Ht '5\' 9"'$  (1.753 m)   Wt 182 lb 6 oz (82.7 kg)   SpO2 98%   BMI 26.93 kg/m  , BMI Body mass index is 26.93 kg/m.  Constitutional:  oriented to person, place, and time. No distress.  HENT:  Head: Grossly normal Eyes:  no discharge. No scleral icterus.  Neck: No JVD, no carotid bruits  Cardiovascular: Regular rate and rhythm, no murmurs appreciated Pulmonary/Chest: Clear to auscultation bilaterally, no wheezes or rails Abdominal: Soft.  no distension.  no tenderness.  Musculoskeletal: Normal range of motion Neurological:  normal muscle tone. Coordination normal. No atrophy Skin: Skin warm and dry Psychiatric: normal affect, pleasant  Recent Labs: 08/17/2022: ALT 14; BUN 15; Creat 0.84;  Hemoglobin 13.0; Platelets 184; Potassium 4.3; Sodium 142; TSH 1.18    Lipid Panel Lab Results  Component Value Date   CHOL 94 08/17/2022   HDL 38 (L) 08/17/2022   LDLCALC 43 08/17/2022   TRIG 52 08/17/2022      Wt Readings from Last 3 Encounters:  09/13/22 182 lb 6 oz (82.7 kg)  08/23/22 175 lb (79.4 kg)  03/08/22 173 lb (78.5 kg)     ASSESSMENT AND PLAN:  Coronary artery disease with stable angina Prior STEMI presented April 2019 with STEMI occluded proximal RCA nonobstructive LAD disease, circumflex disease Echocardiogram normal ejection fraction December 2020 Prior history of epistaxis in the winter season, aspirin previously held Difficulty  affording Brilinta we will change him to Plavix 75 mg daily, continue to hold aspirin  Essential (primary) hypertension - Blood pressure is well controlled on today's visit. No changes made to the medications.  Irregular rhythm Prior history atrial fibrillation noted at time of catheterization April 2019 Zio monitor ordered for further evaluation  Pure hypercholesterolemia -  Cholesterol is at goal on the current lipid regimen. No changes to the medications were made.  Diabetes mellitus due to underlying condition with hyperosmolarity without coma, without long-term current use of insulin (HCC) A1c well controlled  Shortness of breath Shortness of breath improving, uses his bike for regular exercise and conditioning  Chronic cough Possibly related to GERD Unable to exclude postnasal drip, on Flonase   Total encounter time more than 30 minutes  Greater than 50% was spent in counseling and coordination of care with the patient    No orders of the defined types were placed in this encounter.    Signed, Esmond Plants, M.D., Ph.D. 09/13/2022  [place], [place]

## 2022-09-13 NOTE — Telephone Encounter (Signed)
*  STAT* If patient is at the pharmacy, call can be transferred to refill team.   1. Which medications need to be refilled? (please list name of each medication and dose if known) valsartan (DIOVAN) 40 MG tablet atorvastatin (LIPITOR) 80 MG tablet  2. Which pharmacy/location (including street and city if local pharmacy) is medication to be sent to?  [place], [place] - [address]    3. Do they need a 30 day or 90 day supply? [place]

## 2022-09-13 NOTE — Patient Instructions (Addendum)
Medication Instructions:  When you run out of brilinta, Start plavix 75 mg daily  If you need a refill on your cardiac medications before your next appointment, please call your pharmacy.   Lab work: No new labs needed  Testing/Procedures: Your physician has recommended that you wear a 14 day Zio monitor.   This monitor is a medical device that records the heart's electrical activity. Doctors most often use these monitors to diagnose arrhythmias. Arrhythmias are problems with the speed or rhythm of the heartbeat. The monitor is a small device applied to your chest. You can wear one while you do your normal daily activities. While wearing this monitor if you have any symptoms to push the button and record what you felt. Once you have worn this monitor for the period of time provider prescribed (Usually 14 days), you will return the monitor device in the postage paid box. Once it is returned they will download the data collected and provide [place] with a report which the provider will then review and we will call you with those results. Important tips:  Avoid showering during the first 24 hours of wearing the monitor. Avoid excessive sweating to help maximize wear time. Do not submerge the device, no hot tubs, and no swimming pools. Keep any lotions or oils away from the patch. After 24 hours you may shower with the patch on. Take brief showers with your back facing the shower head.  Do not remove patch once it has been placed because that will interrupt data and decrease adhesive wear time. Push the button when you have any symptoms and write down what you were feeling. Once you have completed wearing your monitor, remove and place into box which has postage paid and place in your outgoing mailbox.  If for some reason you have misplaced your box then call our office and we can provide another box and/or mail it off for you.     Follow-Up: At [facility], you and your health needs are our  priority.  As part of our continuing mission to provide you with exceptional heart care, we have created designated Provider Care Teams.  These Care Teams include your primary Cardiologist (physician) and Advanced Practice Providers (APPs -  Physician Assistants and Nurse Practitioners) who all work together to provide you with the care you need, when you need it.  You will need a follow up appointment in 12 months  Providers on your designated Care Team:   Murray Hodgkins, NP Christell Faith, PA-C Cadence Kathlen Mody, [place]  COVID-19 Vaccine Information can be found at: [email or web address] For questions related to vaccine distribution or appointments, please email [email or web address] or call [phone].

## 2022-09-14 ENCOUNTER — Other Ambulatory Visit: Payer: Self-pay | Admitting: Cardiovascular Disease

## 2022-09-14 NOTE — Telephone Encounter (Signed)
*  STAT* If patient is at the pharmacy, call can be transferred to refill team.   1. Which medications need to be refilled? (please list name of each medication and dose if known) Carvedilol  2. Which pharmacy/location (including street and city if local pharmacy) is medication to be sent to? [place]  3. Do they need a 30 day or 90 day supply? 90 days and refills

## 2022-09-17 DIAGNOSIS — R002 Palpitations: Secondary | ICD-10-CM

## 2022-09-17 DIAGNOSIS — I493 Ventricular premature depolarization: Secondary | ICD-10-CM

## 2022-10-06 DIAGNOSIS — R002 Palpitations: Secondary | ICD-10-CM | POA: Diagnosis not present

## 2022-10-06 DIAGNOSIS — I493 Ventricular premature depolarization: Secondary | ICD-10-CM | POA: Diagnosis not present

## 2022-10-13 ENCOUNTER — Telehealth: Payer: Self-pay | Admitting: Cardiovascular Disease

## 2022-10-13 MED ORDER — CARVEDILOL 3.125 MG PO TABS: 3.1250 mg | ORAL_TABLET | Freq: Two times a day (BID) | ORAL | 3 refills | Status: DC

## 2022-10-13 NOTE — Telephone Encounter (Signed)
*  STAT* If patient is at the pharmacy, call can be transferred to refill team.   1. Which medications need to be refilled? (please list name of each medication and dose if known)  carvedilol (COREG) 3.125 MG tablet   2. Which pharmacy/location (including street and city if local pharmacy) is medication to be sent to? [place], [place] - [address]   3. Do they need a 30 day or 90 day supply? 90 day

## 2022-10-13 NOTE — Telephone Encounter (Signed)
Spoke with pharmacy and they are wanting to clarify what dose he should be taking. They report patient has been on Carvedilol 12.5 mg for years and then it was changed to 3.125 mg. They are wanting to confirm this change and do not see it mentioned in his chart. Please review and advise what dosage he should be taking. Currently he is taking the 12.5 mg. See below on this medication.

## 2022-10-13 NOTE — Telephone Encounter (Signed)
[facility] called to verify the medication that was sent in which was carvedilol (COREG) 3.125 MG tablet. Want to get a verbal to make sure dosage is correct

## 2022-10-16 ENCOUNTER — Other Ambulatory Visit: Payer: Self-pay | Admitting: Cardiovascular Disease

## 2022-10-16 MED ORDER — CARVEDILOL 12.5 MG PO TABS: 12.5000 mg | ORAL_TABLET | Freq: Two times a day (BID) | ORAL | 3 refills | Status: DC

## 2022-10-18 NOTE — Telephone Encounter (Signed)
Spoke with pharmacy and updated them on dosage he should be taking. They verbalized understanding with no further questions at this time   Minna Merritts, MD  Sent: Sun October 16, 2022 12:55 PM  To: [facility] Triage   Message  I believe he should be taking carvedilol 12.5 mg twice daily  The 3.125 mg dose first showed up when he saw primary care beginning of November 2023, I think it is a typo and was entered at the lower dose by mistake  He can go back up to the 12.5 twice daily  Thx  TGollan

## 2022-10-20 DIAGNOSIS — H43813 Vitreous degeneration, bilateral: Secondary | ICD-10-CM | POA: Diagnosis not present

## 2022-10-20 DIAGNOSIS — Z961 Presence of intraocular lens: Secondary | ICD-10-CM | POA: Diagnosis not present

## 2022-11-07 ENCOUNTER — Telehealth: Payer: Self-pay | Admitting: Cardiovascular Disease

## 2022-11-07 ENCOUNTER — Encounter: Payer: Self-pay | Admitting: Cardiovascular Disease

## 2022-11-07 NOTE — Telephone Encounter (Signed)
I did not need this encounter. The nurse had already talked to the patient

## 2022-11-07 NOTE — Telephone Encounter (Signed)
STAT if HR is under 50 or over 120 (normal HR is 60-100 beats per minute)  What is your heart rate? Few minutes ago hr - 37   Do you have a log of your heart rate readings (document readings)?  1/22: 37 waited 10 minutes went up to 47  1/16: 46 waited 10 minutes went up to 64 1/8:  69, 57   Do you have any other symptoms? No, pt denies any symptoms , but it is concerned that his hr is low.

## 2022-11-07 NOTE — Telephone Encounter (Signed)
Spoke with patient and he reports low heart rates. They were 37 then 47. Inquired if he had any symptoms such as dizziness or faint feeling. He declined stating that he had no symptoms. He then went and was riding his bike for 18 minutes and heart rate was then 67. Advised that I would send this over to Dr. Rockey Situ for his review and then we would be in touch. He verbalized understanding with no further questions at this time.

## 2022-11-07 NOTE — Telephone Encounter (Signed)
STAT if HR is under 50 or over 120 (normal HR is 60-100 beats per minute)  What is your heart rate? 37  Do you have a log of your heart rate readings (document readings)?   Do you have any other symptoms? No symptoms

## 2022-11-08 NOTE — Telephone Encounter (Signed)
Spoke with patients wife and reviewed providers reply with her. Instructed her to have him continue to monitor his blood pressures and heart rates along with any symptoms he may have. She verbalized understanding and was appreciative for the call back. No further needs or questions at this time.    Minna Merritts, MD  [facility] Triage Caller: Unspecified (Yesterday, 10:52 AM) We just had a monitor and lowest heart rate was 55 What in likely happening is the pulse meter or BP cuff is not measuring ectopy. There were frequent PVCs, these will not capture and it will look artificially low. When he has low rates, it is a sign of extra beats not counting Thx TGollan

## 2022-11-09 ENCOUNTER — Encounter: Payer: Self-pay | Admitting: Cardiovascular Disease

## 2022-11-09 NOTE — Telephone Encounter (Signed)
Error

## 2022-11-09 NOTE — Telephone Encounter (Addendum)
Pt's daughter called stating she is still concerned about pt's low HR. She reports pt HR was reading between 35-37 but reports pt is asymptomatic.  Nurse informed daughter of the below previous recommendations from Dr. Rockey Situ.  However, daughter stated she is very concerned.   Appointment scheduled for 1/26 for further recommendations  We just had a monitor and lowest heart rate was 55 What in likely happening is the pulse meter or BP cuff is not measuring ectopy. There were frequent PVCs, these will not capture and it will look artificially low. When he has low rates, it is a sign of extra beats not counting Thx TGollan

## 2022-11-09 NOTE — Progress Notes (Signed)
Cardiology Office Note    Date:  11/11/2022   ID:  Yu, Peggs [birth year], MRN [id number]  PCP:  Olin Hauser, DO  Cardiologist:  Ida Rogue, MD  Electrophysiologist:  None   Chief Complaint: Possible bradycardia  History of Present Illness:   Barry [name] is a 77 y.o. male with history of CAD with inferior ST elevation MI in 01/2018 status post PCI to the proximal/mid RCA complicated by transient atrial fibrillation, paroxysmal SVT, HFimpEF, frequent PVCs, HTN, HLD, DM2, arthritis, and anxiety who presents for evaluation of possible bradycardia.  He was admitted to the hospital in 01/2018 with an inferior ST elevation MI.  LHC showed thrombotic occlusion of the proximal RCA treated with successful PCI/DES to the proximal/mid RCA.  There was moderate, nonobstructive disease involving the LAD and LCx with the proximal/mid LAD noted to be aneurysmal.  LVEF 45 to 50% with basal and mid inferior akinesis.  Atrial fibrillation was noted at the start of the procedure was spontaneously converted to sinus rhythm after reestablishment of flow in the RCA.  Echo during the admission showed an EF of 45 to 50%, mild LVH, hypokinesis of the basal mid inferolateral and inferior myocardium, normal diastolic function, trivial aortic insufficiency and a mildly to moderately reduced RV systolic function with normal ventricular cavity size.  Outpatient cardiac monitoring in 01/2018 showed a predominant rhythm of sinus with an average rate of 80 bpm, 24 episodes of SVT/atrial tachycardia occurred with the fastest interval lasting 7 beats with a rate of 176 bpm and the longest interval lasting 18 beats with an average rate of 152 bpm, rare atrial and ventricular ectopy were noted representing a less than 1% burden.  Most recent echo from 09/2019 showed normalization of LV systolic function with an EF of 55 to 40%, grade 1 diastolic dysfunction, no regional wall motion abnormalities,  normal RV systolic function and ventricular cavity size, trivial mitral regurgitation, and borderline dilatation of the aortic root measuring 39 mm.  Most recently, he was seen in the office on 09/13/2022 reporting his home BP monitor was showing periodic irregular heart rate/rhythm.  EKG at that time showed frequent PVCs.  He was asymptomatic.  Subsequent Zio patch in 08/2022 showed a predominant rhythm of sinus with an average rate of 75 bpm with a range of 55 to 176 bpm, 25 episodes of SVT with the fastest interval lasting 4 beats with a maximal rate of 176 bpm and the longest interval lasting 12 beats with an average rate of 147 bpm.  There were frequent PVCs totaling 171,234 with a 12.3% burden, occasional ventricular couplets totaling 10,030 representing a 1.4% burden, and rare ventricular triplets along with rare PACs, atrial couplets, and atrial triplets.  1 patient triggered event was associated with sinus rhythm.  Patient contacted our office reporting heart rates in the mid to upper 30s bpm by his home monitor.  Patient's primary cardiologist reached out and indicated these readings were likely false given ventricular ectopy burden with recommendation to follow-up today.  He comes in accompanied by family member today and is doing well from a cardiac perspective, without symptoms of angina, dyspnea, palpitations, dizziness, presyncope, syncope, or cardiac decompensation.  No lower extremity swelling or progressive orthopnea.  Patient and family report he has had his BP cuff report heart rate readings of 35 and 37 bpm following a nap.  They indicate his heart rate has subsequently trended into the 60s to 70s bpm without intervention.  During  these episodes where his BP cuff indicates a low heart rate, he is completely asymptomatic.   Labs independently reviewed: 08/2022 - BUN 15, serum creatinine 0.84, potassium 4.3, albumin 4.2, AST/ALT normal, Hgb 13.0, PLT 184, TC 94, TG 52, HDL 38, LDL 43,  A1c 6.7, TSH normal 01/2018 - magnesium 2.0  Past Medical History:  Diagnosis Date   Arthritis    BPH (benign prostatic hypertrophy)    Coronary artery disease    Dyspnea    d/t heart problem.  worsens in hot or cold extremes   GERD (gastroesophageal reflux disease)    Hypertension    Myocardial infarction [facility])    April 2019. 1 stent placed   Wears dentures    partial upper    Past Surgical History:  Procedure Laterality Date   CATARACT EXTRACTION W/PHACO Right 04/12/2016   Procedure: CATARACT EXTRACTION PHACO AND INTRAOCULAR LENS PLACEMENT ([place]);  Surgeon: Birder Robson, MD;  Location: [facility] ORS;  Service: Ophthalmology;  Laterality: Right;  [place]' 01:05AP% 26.6CDE 17.56fuid pack lot # [id number]   CATARACT EXTRACTION W/PHACO Left 03/08/2022   Procedure: CATARACT EXTRACTION PHACO AND INTRAOCULAR LENS PLACEMENT ([facility]) LEFT 22.80 01:43.8;  Surgeon: PBirder Robson MD;  [place] [facility]  Service: Ophthalmology;  Laterality: Left;   CORONARY/GRAFT ACUTE MI REVASCULARIZATION N/A 01/16/2018   Procedure: Coronary/Graft Acute MI Revascularization;  Surgeon: ENelva Bush MD;  [facility];  Service: Cardiovascular;  Laterality: N/A;   EYE SURGERY     KNEE SURGERY Right 09/21/2012   arthroscopy   LEFT HEART CATH AND CORONARY ANGIOGRAPHY N/A 01/16/2018   Procedure: LEFT HEART CATH AND CORONARY ANGIOGRAPHY;  Surgeon: ENelva Bush MD;  [facility];  Service: Cardiovascular;  Laterality: N/A;   TOTAL KNEE ARTHROPLASTY Right 05/15/2019   Procedure: TOTAL KNEE ARTHROPLASTY;  Surgeon: BLovell Sheehan MD;  Location: [facility] ORS;  Service: Orthopedics;  Laterality: Right;    Current Medications: Current Meds  Medication Sig   atorvastatin (LIPITOR) 80 MG tablet Take 1 tablet (80 mg total) by mouth daily at 6 PM.   carvedilol (COREG) 12.5 MG tablet Take 1 tablet (12.5 mg total) by mouth 2 (two) times daily with a meal.   clopidogrel (PLAVIX) 75  MG tablet Take 1 tablet (75 mg total) by mouth daily.   Famotidine (PEPCID AC PO) Take 1 tablet by mouth as needed.   fluticasone (FLONASE) 50 MCG/ACT nasal spray Place 2 sprays into both nostrils daily. Use for 4-6 weeks then stop and use seasonally or as needed.   tamsulosin (FLOMAX) 0.4 MG CAPS capsule Take 1 capsule (0.4 mg total) by mouth daily.   valsartan (DIOVAN) 40 MG tablet Take 1 tablet (40 mg total) by mouth daily.    Allergies:   Lisinopril   Social History   Socioeconomic History   Marital status: Married    Spouse name: [name]  Number of children: Not on file   Years of education: 12th grade   Highest education level: High school graduate  Occupational History   Occupation: retired  Tobacco Use   Smoking status: Former    Types: Cigars    Quit date: 05/07/1974    Years since quitting: 48.5   Smokeless tobacco: Former    Types: Chew    Quit date: 10/17/1993  Vaping Use   Vaping Use: Never used  Substance and Sexual Activity   Alcohol use: No   Drug use: No   Sexual activity: Not on file  Other  Topics Concern   Not on file  Social History Narrative   Not on file   Social Determinants of Health   Financial Resource Strain: Low Risk  (10/01/2021)   Overall Financial Resource Strain (CARDIA)    Difficulty of Paying Living Expenses: Not hard at all  Food Insecurity: No Food Insecurity (10/01/2021)   Hunger Vital Sign    Worried About Running Out of Food in the Last Year: Never true    Ran Out of Food in the Last Year: Never true  Transportation Needs: No Transportation Needs (10/01/2021)   PRAPARE - Hydrologist (Medical): No    Lack of Transportation (Non-Medical): No  Physical Activity: Insufficiently Active (10/01/2021)   Exercise Vital Sign    Days of Exercise per Week: 2 days    Minutes of Exercise per Session: 20 min  Stress: No Stress Concern Present (10/01/2021)   [place]    Feeling of Stress : Not at all  Social Connections: Moderately Integrated (10/01/2021)   Social Connection and Isolation Panel [NHANES]    Frequency of Communication with Friends and Family: Three times a week    Frequency of Social Gatherings with Friends and Family: Once a week    Attends Religious Services: More than 4 times per year    Active Member of [employer] or Organizations: No    Attends Music therapist: Never    Marital Status: Married     Family History:  The patient's family history includes Diabetes in his mother; Hyperlipidemia in his paternal aunt; Melanoma in his brother; Prostate cancer in his brother; Stroke in his mother.  ROS:   12-point review of systems is negative unless otherwise noted in HPI.   EKGs/Labs/Other Studies Reviewed:    Studies reviewed were summarized above. The additional studies were reviewed today:  Zio patch 08/2022: Patch Wear Time:  13 days and 23 hours (2023-12-02T15:09:22-0500 to 2023-12-16T15:09:18-0500)   Normal sinus rhythm Patient had a min HR of 55 bpm, max HR of 176 bpm, and avg HR of 75 bpm.    25 Supraventricular Tachycardia runs occurred, the run with the fastest interval lasting 4 beats with a max rate of 176 bpm, the longest lasting 12 beats with an avg rate of 147 bpm.  (Not patient triggered)   Isolated SVEs were rare (<1.0%), SVE Couplets were rare (<1.0%), and SVE Triplets were rare (<1.0%).  Isolated VEs were frequent (12.3%, 171234), VE Couplets were occasional (1.4%, 10030), and VE  Triplets were rare (<1.0%, 402). Ventricular Bigeminy and Trigeminy were present.    Patient triggered event (1)  associated with normal sinus rhythm. __________  2D echo 09/25/2019: 1. Left ventricular ejection fraction, by visual estimation, is 55 to  60%. The left ventricle has normal function. There is no left ventricular  hypertrophy.   2. Left ventricular diastolic parameters are  consistent with Grade I  diastolic dysfunction (impaired relaxation).   3. The left ventricle has no regional wall motion abnormalities.   4. Global right ventricle has normal systolic function.The right  ventricular size is normal. No increase in right ventricular wall  thickness.   5. Left atrial size was normal.   6. Right atrial size was normal.   7. The mitral valve is normal in structure. Trivial mitral valve  regurgitation. No evidence of mitral stenosis.   8. The tricuspid valve is normal in structure. Tricuspid valve  regurgitation is not  demonstrated.   9. The aortic valve is normal in structure. Aortic valve regurgitation is  trivial. No evidence of aortic valve sclerosis or stenosis.  10. The pulmonic valve was not well visualized. Pulmonic valve  regurgitation is not visualized.  11. Aortic dilatation noted.  12. There is borderline dilatation of the aortic root measuring 39 mm.  13. The inferior vena cava is normal in size with greater than 50%  respiratory variability, suggesting right atrial pressure of 3 mmHg.  __________  Carotid artery ultrasound 02/12/2018: Final Interpretation:  Right Carotid: Velocities in the right ICA are consistent with a 1-39% stenosis.   Left Carotid: Velocities in the left ICA are consistent with a 1-39% stenosis.   Vertebrals: Bilateral vertebral arteries demonstrate antegrade flow.  Subclavians: Normal flow hemodynamics were seen in bilateral subclavian arteries.  __________  [facility] Re[facility] patch 01/2018: Normal sinus rhythm Avg HR of 80 bpm.    24 Supraventricular Tachycardia/atrial tachycardia  runs occurred,  the run with the fastest interval lasting 7 beats with a max rate of 176 bpm,  the longest lasting 18 beats with an avg rate of 152 bpm.    Isolated SVEs were rare (<1.0%), SVE Couplets were rare (<1.0%), and SVE Triplets were rare (<1.0%). Isolated VEs were rare (<1.0%), VE Couplets were rare (<1.0%), and no VE Triplets were  present.   No patient triggered events noted __________  2D echo 01/16/2018: - Left ventricle: The cavity size was normal. Wall thickness was    increased in a pattern of mild LVH. Systolic function was mildly    reduced. The estimated ejection fraction was in the range of 45%    to 50%. There is hypokinesis of the basal-midinferolateral and    inferior myocardium. Left ventricular diastolic function    parameters were normal for the patient&'s age.  - Aortic valve: There was trivial regurgitation.  - Right ventricle: The cavity size was normal. Systolic function    was mildly to moderately reduced.  __________  [facility] 01/16/2018: Conclusions: Inferior STEMI with thrombotic occlusion of the proximal RCA. Moderate, non-obstructive coronary artery disease involving the LAD and LCx. The proximal/mid LAD is aneurysmal. Mildly reduced left ventricular contraction with basal and mid inferior akinesis. LVEF 45-50%. Mildly elevated left ventricular filling pressure (LVEDP ~20 mmHg). Successful primary PCI to proximal/mid RCA using a Xience Sierra 3.0 x 38 mm drug-eluting stent with 0% residual stenosis and TIMI-3 flow. Atrial fibrillation noted at start of procedure, which spontaneously converted to sinus rhythm after reestablishment of flow in the right coronary artery.   Recommendations: Continue tirofiban for 6 hours. Load with ticagrelor 180 mg x 1 when patient arrives in the ICU and nausea has resolved. Continue dual antiplatelet therapy for at least 12 months. Aggressive secondary prevention, including escalation of statin therapy. Obtain transthoracic echocardiogram. Medical therapy for non-critical left coronary artery disease.    EKG:  EKG is ordered today.  The EKG ordered today demonstrates NSR, 68 bpm, no acute ST-T changes  Recent Labs: 08/17/2022: ALT 14; BUN 15; Creat 0.84; Hemoglobin 13.0; Platelets 184; Potassium 4.3; Sodium 142; TSH 1.18  Recent Lipid Panel    Component  Value Date/Time   CHOL 94 08/17/2022 0851   CHOL 154 11/30/2015 0937   TRIG 52 08/17/2022 0851   HDL 38 (L) 08/17/2022 0851   HDL 43 11/30/2015 0937   CHOLHDL 2.5 08/17/2022 0851   VLDL 26 01/16/2018 1318   LDLCALC 43 08/17/2022 0851    PHYSICAL EXAM:  VS:  BP 122/60 (BP Location: Left Arm, Patient Position: Sitting, Cuff Size: Normal)   Pulse 68   Ht '5\' 9"'$  (1.753 m)   Wt 179 lb 6 oz (81.4 kg)   SpO2 96%   BMI 26.49 kg/m   BMI: Body mass index is 26.49 kg/m.  Physical Exam Vitals reviewed.  Constitutional:      Appearance: He is well-developed.  HENT:     Head: Normocephalic and atraumatic.  Eyes:     General:        Right eye: No discharge.        Left eye: No discharge.  Neck:     Vascular: No JVD.  Cardiovascular:     Rate and Rhythm: Normal rate and regular rhythm.     Heart sounds: Normal heart sounds, S1 normal and S2 normal. Heart sounds not distant. No midsystolic click and no opening snap. No murmur heard.    No friction rub.  Pulmonary:     Effort: Pulmonary effort is normal. No respiratory distress.     Breath sounds: Normal breath sounds. No decreased breath sounds, wheezing or rales.  Chest:     Chest wall: No tenderness.  Abdominal:     General: There is no distension.  Musculoskeletal:     Cervical back: Normal range of motion.     Right lower leg: No edema.     Left lower leg: No edema.  Skin:    General: Skin is warm and dry.     Nails: There is no clubbing.  Neurological:     Mental Status: He is alert and oriented to person, place, and time.  Psychiatric:        Speech: Speech normal.        Behavior: Behavior normal.        Thought Content: Thought content normal.        Judgment: Judgment normal.     Wt Readings from Last 3 Encounters:  11/11/22 179 lb 6 oz (81.4 kg)  09/13/22 182 lb 6 oz (82.7 kg)  08/23/22 175 lb (79.4 kg)     ASSESSMENT & PLAN:   Pseudobradycardia/Frequent PVCs: Patient and family report heart rate of 35  and 37 bpm by home BP cuff, at which time patient is completely asymptomatic.  Recent Zio patch showed a minimum heart rate of 55 bpm with frequent PVCs representing a greater than 12% burden.  I suspect the patient is having episodes of ventricular bigeminy in the setting of frequent PVCs leading to falsely low automated BP cuff heart rate readings.  Recent Zio patch showed no evidence of rates in the 30s bpm for evidence of high-grade AV block/prolonged pauses.  Repeat Zio patch to ensure there have been no changes in conduction with newly reported bradycardic rates.  Obtain echo to evaluate for any new structural abnormalities.  Check magnesium, BMP, and TSH.  Continue current dose of carvedilol 12.5 mg twice daily.  If he is again found to have significant PVC burden and workup is unrevealing, would transition carvedilol to metoprolol.  Hemodynamically stable.  CAD involving the native coronary arteries without angina: He is doing well and is without symptoms of angina or cardiac decompensation.  Continue aggressive risk factor modification and secondary prevention including clopidogrel, atorvastatin, carvedilol, and valsartan.  Pending findings on repeat Zio patch and echo, may need to consider ischemic evaluation as a part of workup for frequent PVCs.  HFimpEF: He appears euvolemic and well compensated on valsartan and carvedilol.  Not requiring a standing loop diuretic.  Obtain echo as outlined above.  PSVT: No sustained episodes.  Asymptomatic.  Remains on carvedilol.  [place] A-fib: Noted during inferior STEMI with spontaneous conversion to sinus rhythm following revascularization of the RCA.  Subsequent outpatient cardiac monitoring has shown no further evidence of A-fib.  In this setting, has not been maintained on [place].  Should he documented evidence of recurrence of A-fib [place] will need to be revisited at that time.  HTN: Blood pressure is well-controlled in the office today.  He remains on  carvedilol and valsartan.  HLD: LDL 43 in 08/2022 with normal AST/ALT at that time.   Disposition: F/u with Dr. Rockey Situ or an APP in 6 weeks.   Medication Adjustments/Labs and Tests Ordered: Current medicines are reviewed at length with the patient today.  Concerns regarding medicines are outlined above. Medication changes, Labs and Tests ordered today are summarized above and listed in the Patient Instructions accessible in Encounters.   SignedChristell Faith, PA-C 11/11/2022 12:23 PM     [facility] [address] [place], [place] [address] [phone]

## 2022-11-09 NOTE — Telephone Encounter (Signed)
Pt's daughter states that she is still a bit concerned in regards to her father low HR. She says that yesterday's readings were at normal range, but before that and today they have still been reading between 35-37 HR.  She has made an appt with P.A.Fabienne Bruns, P.A. 1/29, but would like a call back to make sure these readings are still okay.

## 2022-11-11 ENCOUNTER — Ambulatory Visit (INDEPENDENT_AMBULATORY_CARE_PROVIDER_SITE_OTHER): Payer: PPO

## 2022-11-11 ENCOUNTER — Other Ambulatory Visit
Admission: RE | Admit: 2022-11-11 | Discharge: 2022-11-11 | Disposition: A | Discharge status: Home / Self Care | Payer: PPO | Source: Ambulatory Visit | Attending: Physician Assistant | Admitting: Physician Assistant

## 2022-11-11 ENCOUNTER — Ambulatory Visit: Payer: PPO | Attending: Physician Assistant | Admitting: Physician Assistant

## 2022-11-11 ENCOUNTER — Encounter: Payer: Self-pay | Admitting: Physician Assistant

## 2022-11-11 VITALS — BP 122/60 | HR 68 | Ht 69.0 in | Wt 179.4 lb

## 2022-11-11 DIAGNOSIS — I5022 Chronic systolic (congestive) heart failure: Secondary | ICD-10-CM | POA: Diagnosis not present

## 2022-11-11 DIAGNOSIS — I493 Ventricular premature depolarization: Secondary | ICD-10-CM | POA: Diagnosis not present

## 2022-11-11 DIAGNOSIS — I2510 Atherosclerotic heart disease of native coronary artery without angina pectoris: Secondary | ICD-10-CM

## 2022-11-11 DIAGNOSIS — I471 Supraventricular tachycardia, unspecified: Secondary | ICD-10-CM

## 2022-11-11 DIAGNOSIS — E785 Hyperlipidemia, unspecified: Secondary | ICD-10-CM | POA: Diagnosis not present

## 2022-11-11 DIAGNOSIS — I4891 Unspecified atrial fibrillation: Secondary | ICD-10-CM

## 2022-11-11 DIAGNOSIS — E1169 Type 2 diabetes mellitus with other specified complication: Secondary | ICD-10-CM | POA: Insufficient documentation

## 2022-11-11 DIAGNOSIS — I10 Essential (primary) hypertension: Secondary | ICD-10-CM

## 2022-11-11 LAB — BASIC METABOLIC PANEL
Anion gap: 8 (ref 5–15)
BUN: 20 mg/dL (ref 8–23)
CO2: 24 mmol/L (ref 22–32)
Calcium: 8.9 mg/dL (ref 8.9–10.3)
Chloride: 109 mmol/L (ref 98–111)
Creatinine, Ser: 0.88 mg/dL (ref 0.61–1.24)
GFR, Estimated: >60 mL/min (ref >60)
Glucose, Bld: 113 mg/dL — ABNORMAL HIGH (ref 70–99)
Potassium: 3.9 mmol/L (ref 3.5–5.1)
Sodium: 141 mmol/L (ref 135–145)

## 2022-11-11 LAB — TSH: TSH: 1.231 u[IU]/mL (ref 0.350–4.500)

## 2022-11-11 LAB — MAGNESIUM: Magnesium: 2.3 mg/dL (ref 1.7–2.4)

## 2022-11-11 NOTE — Patient Instructions (Signed)
Medication Instructions:  Your physician recommends that you continue on your current medications as directed. Please refer to the Current Medication list given to you today.  *If you need a refill on your cardiac medications before your next appointment, please call your pharmacy*   Lab Work: BMP, TSH, and magnesium level - Please go to the [facility]. You will check in at the front desk to the right as you walk into the atrium. Valet Parking is offered if needed. - No appointment needed. You may go any day between 7 am and 6 pm.  If you have labs (blood work) drawn today and your tests are completely normal, you will receive your results only by: [place] (if you have MyChart) OR A paper copy in the mail If you have any lab test that is abnormal or we need to change your treatment, we will call you to review the results.   Testing/Procedures: Your physician has requested that you have an echocardiogram. Echocardiography is a painless test that uses sound waves to create images of your heart. It provides your doctor with information about the size and shape of your heart and how well your heart's chambers and valves are working. This procedure takes approximately one hour. There are no restrictions for this procedure. Please do NOT wear cologne, perfume, aftershave, or lotions (deodorant is allowed). Please arrive 15 minutes prior to your appointment time.  Your physician has recommended that you wear a Zio monitor.   This monitor is a medical device that records the heart's electrical activity. Doctors most often use these monitors to diagnose arrhythmias. Arrhythmias are problems with the speed or rhythm of the heartbeat. The monitor is a small device applied to your chest. You can wear one while you do your normal daily activities. While wearing this monitor if you have any symptoms to push the button and record what you felt. Once you have worn this monitor for the period of  time provider prescribed (Usually 14 days), you will return the monitor device in the postage paid box. Once it is returned they will download the data collected and provide [place] with a report which the provider will then review and we will call you with those results. Important tips:  Avoid showering during the first 24 hours of wearing the monitor. Avoid excessive sweating to help maximize wear time. Do not submerge the device, no hot tubs, and no swimming pools. Keep any lotions or oils away from the patch. After 24 hours you may shower with the patch on. Take brief showers with your back facing the shower head.  Do not remove patch once it has been placed because that will interrupt data and decrease adhesive wear time. Push the button when you have any symptoms and write down what you were feeling. Once you have completed wearing your monitor, remove and place into box which has postage paid and place in your outgoing mailbox.  If for some reason you have misplaced your box then call our office and we can provide another box and/or mail it off for you.  Follow-Up: At [facility], you and your health needs are our priority.  As part of our continuing mission to provide you with exceptional heart care, we have created designated Provider Care Teams.  These Care Teams include your primary Cardiologist (physician) and Advanced Practice Providers (APPs -  Physician Assistants and Nurse Practitioners) who all work together to provide you with the care you need, when  you need it.  We recommend signing up for the patient portal called "MyChart".  Sign up information is provided on this After Visit Summary.  MyChart is used to connect with patients for Virtual Visits (Telemedicine).  Patients are able to view lab/test results, encounter notes, upcoming appointments, etc.  Non-urgent messages can be sent to your provider as well.   To learn more about what you can do with MyChart, go to  [email or web address].    Your next appointment:   6 weeks  Provider:   You may see Ida Rogue, MD or one of the following Advanced Practice Providers on your designated Care Team:   Christell Faith, [place]

## 2022-11-14 ENCOUNTER — Ambulatory Visit: Payer: PPO | Admitting: Medical

## 2022-11-15 DIAGNOSIS — I493 Ventricular premature depolarization: Secondary | ICD-10-CM | POA: Diagnosis not present

## 2022-11-26 ENCOUNTER — Encounter: Payer: Self-pay | Admitting: Emergency Medicine

## 2022-11-26 ENCOUNTER — Emergency Department
Admission: EM | Admit: 2022-11-26 | Discharge: 2022-11-26 | Disposition: A | Discharge status: Home / Self Care | Payer: PPO | Source: Home / Self Care | Attending: Emergency Medicine | Admitting: Emergency Medicine

## 2022-11-26 DIAGNOSIS — I10 Essential (primary) hypertension: Secondary | ICD-10-CM | POA: Diagnosis not present

## 2022-11-26 DIAGNOSIS — D696 Thrombocytopenia, unspecified: Secondary | ICD-10-CM | POA: Diagnosis not present

## 2022-11-26 DIAGNOSIS — I5032 Chronic diastolic (congestive) heart failure: Secondary | ICD-10-CM | POA: Diagnosis not present

## 2022-11-26 DIAGNOSIS — I959 Hypotension, unspecified: Secondary | ICD-10-CM | POA: Diagnosis not present

## 2022-11-26 DIAGNOSIS — J9601 Acute respiratory failure with hypoxia: Secondary | ICD-10-CM | POA: Diagnosis not present

## 2022-11-26 DIAGNOSIS — I480 Paroxysmal atrial fibrillation: Secondary | ICD-10-CM | POA: Diagnosis not present

## 2022-11-26 DIAGNOSIS — R110 Nausea: Secondary | ICD-10-CM | POA: Diagnosis not present

## 2022-11-26 DIAGNOSIS — E119 Type 2 diabetes mellitus without complications: Secondary | ICD-10-CM | POA: Diagnosis not present

## 2022-11-26 DIAGNOSIS — Z833 Family history of diabetes mellitus: Secondary | ICD-10-CM | POA: Diagnosis not present

## 2022-11-26 DIAGNOSIS — E785 Hyperlipidemia, unspecified: Secondary | ICD-10-CM | POA: Diagnosis not present

## 2022-11-26 DIAGNOSIS — R0600 Dyspnea, unspecified: Secondary | ICD-10-CM | POA: Diagnosis not present

## 2022-11-26 DIAGNOSIS — K219 Gastro-esophageal reflux disease without esophagitis: Secondary | ICD-10-CM | POA: Diagnosis not present

## 2022-11-26 DIAGNOSIS — N400 Enlarged prostate without lower urinary tract symptoms: Secondary | ICD-10-CM | POA: Diagnosis not present

## 2022-11-26 DIAGNOSIS — Z808 Family history of malignant neoplasm of other organs or systems: Secondary | ICD-10-CM | POA: Diagnosis not present

## 2022-11-26 DIAGNOSIS — J209 Acute bronchitis, unspecified: Secondary | ICD-10-CM | POA: Diagnosis not present

## 2022-11-26 DIAGNOSIS — N390 Urinary tract infection, site not specified: Secondary | ICD-10-CM | POA: Diagnosis not present

## 2022-11-26 DIAGNOSIS — Z888 Allergy status to other drugs, medicaments and biological substances status: Secondary | ICD-10-CM | POA: Diagnosis not present

## 2022-11-26 DIAGNOSIS — I2510 Atherosclerotic heart disease of native coronary artery without angina pectoris: Secondary | ICD-10-CM | POA: Diagnosis not present

## 2022-11-26 DIAGNOSIS — R0689 Other abnormalities of breathing: Secondary | ICD-10-CM | POA: Diagnosis not present

## 2022-11-26 DIAGNOSIS — Z87891 Personal history of nicotine dependence: Secondary | ICD-10-CM | POA: Diagnosis not present

## 2022-11-26 DIAGNOSIS — E876 Hypokalemia: Secondary | ICD-10-CM | POA: Diagnosis not present

## 2022-11-26 DIAGNOSIS — Z79899 Other long term (current) drug therapy: Secondary | ICD-10-CM | POA: Diagnosis not present

## 2022-11-26 DIAGNOSIS — Z7902 Long term (current) use of antithrombotics/antiplatelets: Secondary | ICD-10-CM | POA: Diagnosis not present

## 2022-11-26 DIAGNOSIS — I252 Old myocardial infarction: Secondary | ICD-10-CM | POA: Diagnosis not present

## 2022-11-26 DIAGNOSIS — Z96651 Presence of right artificial knee joint: Secondary | ICD-10-CM | POA: Diagnosis not present

## 2022-11-26 DIAGNOSIS — R079 Chest pain, unspecified: Secondary | ICD-10-CM | POA: Diagnosis not present

## 2022-11-26 DIAGNOSIS — Z823 Family history of stroke: Secondary | ICD-10-CM | POA: Diagnosis not present

## 2022-11-26 DIAGNOSIS — A419 Sepsis, unspecified organism: Secondary | ICD-10-CM | POA: Diagnosis not present

## 2022-11-26 DIAGNOSIS — Z8042 Family history of malignant neoplasm of prostate: Secondary | ICD-10-CM | POA: Diagnosis not present

## 2022-11-26 DIAGNOSIS — N3001 Acute cystitis with hematuria: Secondary | ICD-10-CM | POA: Insufficient documentation

## 2022-11-26 DIAGNOSIS — Z1152 Encounter for screening for COVID-19: Secondary | ICD-10-CM | POA: Diagnosis not present

## 2022-11-26 DIAGNOSIS — D6959 Other secondary thrombocytopenia: Secondary | ICD-10-CM | POA: Diagnosis not present

## 2022-11-26 DIAGNOSIS — J9811 Atelectasis: Secondary | ICD-10-CM | POA: Diagnosis not present

## 2022-11-26 DIAGNOSIS — I110 Hypertensive heart disease with heart failure: Secondary | ICD-10-CM | POA: Diagnosis not present

## 2022-11-26 DIAGNOSIS — Z955 Presence of coronary angioplasty implant and graft: Secondary | ICD-10-CM | POA: Diagnosis not present

## 2022-11-26 DIAGNOSIS — N3000 Acute cystitis without hematuria: Secondary | ICD-10-CM | POA: Diagnosis not present

## 2022-11-26 LAB — URINALYSIS, ROUTINE W REFLEX MICROSCOPIC
Bilirubin Urine: NEGATIVE
Glucose, UA: NEGATIVE mg/dL
Ketones, ur: NEGATIVE mg/dL
Nitrite: NEGATIVE
Protein, ur: 30 mg/dL — AB
Specific Gravity, Urine: 1.023 (ref 1.005–1.030)
WBC, UA: >50 WBC/hpf (ref 0–5)
pH: 5 (ref 5.0–8.0)

## 2022-11-26 MED ORDER — CEFDINIR 300 MG PO CAPS: 300.0000 mg | ORAL_CAPSULE | Freq: Two times a day (BID) | ORAL | 0 refills | Status: DC

## 2022-11-26 MED ORDER — CEFDINIR 300 MG PO CAPS
300.0000 mg | ORAL_CAPSULE | Freq: Once | ORAL | Status: AC
  Administered 2022-11-26: 300 mg via ORAL
  Filled 2022-11-26: qty 1

## 2022-11-26 NOTE — ED Triage Notes (Signed)
Pt presents via POV with complaints of urinary frequency started at midnight. Per family, the patient has used the restroom ~4-5 times since midnight which is unlike him. Denies fevers, chills, N/V/D, dysuria, retention, CP or SOB.

## 2022-11-26 NOTE — ED Provider Notes (Signed)
[facility] Provider Note   Event Date/Time   First MD Initiated Contact with Patient 11/26/22 [phone]     (approximate) History  Urinary Frequency  HPI Barry [name] is a 77 y.o. male with a stated past medical history of type 2 diabetes, paroxysmal atrial fibrillation, GERD, and hypertension who presents for urinary frequency that has been present over the last 2 days.  Patient states that he has been approximately 4-5 times tonight.  Patient denies any other exacerbating or relieving symptoms. ROS: Patient currently denies any vision changes, tinnitus, difficulty speaking, facial droop, sore throat, chest pain, shortness of breath, abdominal pain, nausea/vomiting/diarrhea, or weakness/numbness/paresthesias in any extremity   Physical Exam  Triage Vital Signs: ED Triage Vitals  Enc Vitals Group     BP      Pulse      Resp      Temp      Temp src      SpO2      Weight      Height      Head Circumference      Peak Flow      Pain Score      Pain Loc      Pain Edu?      Excl. in [place]?    Most recent vital signs: Vitals:   11/26/22 0449  BP: (!) 142/67  Pulse: 70  Resp: 20  Temp: 98.4 F (36.9 C)  SpO2: 94%   General: Awake, oriented x4. CV:  Good peripheral perfusion.  Resp:  Normal effort.  Abd:  No distention.  Other:  Elderly Caucasian male laying in bed in no acute distress ED Results / Procedures / Treatments  Labs (all labs ordered are listed, but only abnormal results are displayed) Labs Reviewed  URINALYSIS, ROUTINE W REFLEX MICROSCOPIC - Abnormal; Notable for the following components:      Result Value   Color, Urine YELLOW (*)    APPearance CLOUDY (*)    Hgb urine dipstick MODERATE (*)    Protein, ur 30 (*)    Leukocytes,Ua LARGE (*)    Bacteria, UA RARE (*)    All other components within normal limits   PROCEDURES: Critical Care performed: No Procedures MEDICATIONS ORDERED IN ED: Medications  cefdinir (OMNICEF)  capsule 300 mg (has no administration in time range)   IMPRESSION / MDM / ASSESSMENT AND PLAN / ED COURSE  I reviewed the triage vital signs and the nursing notes.                             The patient is on the cardiac monitor to evaluate for evidence of arrhythmia and/or significant heart rate changes. Patient's presentation is most consistent with acute presentation with potential threat to life or bodily function. No e/o epididymo-orchitis on exam and low suspicion for rectal abscess, prostatitis, other GU deep space infection, gonorrhea/chlamydia. Unlikely Infected Urolithiasis, AAA, cholecystitis, pancreatitis, SBO, appendicitis, or other acute abdomen. Workup: UA: None Rx: Cefdinir 300 mg twice daily x5 days  Disposition: Discharge home. SRP discussed. Advise follow up with primary care provider within 24-72 hours.   FINAL CLINICAL IMPRESSION(S) / ED DIAGNOSES   Final diagnoses:  Urinary tract infection with hematuria, site unspecified   Rx / DC Orders   ED Discharge Orders          Ordered    cefdinir (OMNICEF) 300 MG capsule  2 times daily  11/26/22 0521           Note:  This document was prepared using Dragon voice recognition software and may include unintentional dictation errors.   Naaman Plummer, MD 11/26/22 [phone]

## 2022-11-27 ENCOUNTER — Other Ambulatory Visit: Payer: Self-pay

## 2022-11-27 ENCOUNTER — Emergency Department: Payer: PPO

## 2022-11-27 ENCOUNTER — Inpatient Hospital Stay
Admission: EM | Admit: 2022-11-27 | Discharge: 2022-11-28 | DRG: 202 | Disposition: A | Discharge status: Home / Self Care | Payer: PPO | Attending: Internal Medicine | Admitting: Internal Medicine

## 2022-11-27 DIAGNOSIS — J209 Acute bronchitis, unspecified: Secondary | ICD-10-CM | POA: Diagnosis present

## 2022-11-27 DIAGNOSIS — N3000 Acute cystitis without hematuria: Secondary | ICD-10-CM | POA: Diagnosis not present

## 2022-11-27 DIAGNOSIS — E119 Type 2 diabetes mellitus without complications: Secondary | ICD-10-CM | POA: Diagnosis present

## 2022-11-27 DIAGNOSIS — Z1152 Encounter for screening for COVID-19: Secondary | ICD-10-CM

## 2022-11-27 DIAGNOSIS — I5032 Chronic diastolic (congestive) heart failure: Secondary | ICD-10-CM | POA: Diagnosis present

## 2022-11-27 DIAGNOSIS — Z8042 Family history of malignant neoplasm of prostate: Secondary | ICD-10-CM

## 2022-11-27 DIAGNOSIS — E785 Hyperlipidemia, unspecified: Secondary | ICD-10-CM | POA: Diagnosis present

## 2022-11-27 DIAGNOSIS — Z7902 Long term (current) use of antithrombotics/antiplatelets: Secondary | ICD-10-CM | POA: Diagnosis not present

## 2022-11-27 DIAGNOSIS — I10 Essential (primary) hypertension: Secondary | ICD-10-CM | POA: Diagnosis not present

## 2022-11-27 DIAGNOSIS — Z833 Family history of diabetes mellitus: Secondary | ICD-10-CM

## 2022-11-27 DIAGNOSIS — Z96651 Presence of right artificial knee joint: Secondary | ICD-10-CM | POA: Diagnosis present

## 2022-11-27 DIAGNOSIS — D6959 Other secondary thrombocytopenia: Secondary | ICD-10-CM | POA: Diagnosis present

## 2022-11-27 DIAGNOSIS — N390 Urinary tract infection, site not specified: Secondary | ICD-10-CM | POA: Diagnosis present

## 2022-11-27 DIAGNOSIS — Z823 Family history of stroke: Secondary | ICD-10-CM

## 2022-11-27 DIAGNOSIS — Z79899 Other long term (current) drug therapy: Secondary | ICD-10-CM

## 2022-11-27 DIAGNOSIS — Z87891 Personal history of nicotine dependence: Secondary | ICD-10-CM | POA: Diagnosis not present

## 2022-11-27 DIAGNOSIS — I252 Old myocardial infarction: Secondary | ICD-10-CM | POA: Diagnosis not present

## 2022-11-27 DIAGNOSIS — D696 Thrombocytopenia, unspecified: Secondary | ICD-10-CM | POA: Diagnosis not present

## 2022-11-27 DIAGNOSIS — K219 Gastro-esophageal reflux disease without esophagitis: Secondary | ICD-10-CM | POA: Diagnosis present

## 2022-11-27 DIAGNOSIS — Z955 Presence of coronary angioplasty implant and graft: Secondary | ICD-10-CM

## 2022-11-27 DIAGNOSIS — N400 Enlarged prostate without lower urinary tract symptoms: Secondary | ICD-10-CM | POA: Diagnosis present

## 2022-11-27 DIAGNOSIS — J9601 Acute respiratory failure with hypoxia: Principal | ICD-10-CM

## 2022-11-27 DIAGNOSIS — Z888 Allergy status to other drugs, medicaments and biological substances status: Secondary | ICD-10-CM | POA: Diagnosis not present

## 2022-11-27 DIAGNOSIS — I25118 Atherosclerotic heart disease of native coronary artery with other forms of angina pectoris: Secondary | ICD-10-CM | POA: Diagnosis present

## 2022-11-27 DIAGNOSIS — I480 Paroxysmal atrial fibrillation: Secondary | ICD-10-CM | POA: Diagnosis present

## 2022-11-27 DIAGNOSIS — I110 Hypertensive heart disease with heart failure: Secondary | ICD-10-CM | POA: Diagnosis present

## 2022-11-27 DIAGNOSIS — E876 Hypokalemia: Secondary | ICD-10-CM | POA: Diagnosis present

## 2022-11-27 DIAGNOSIS — Z808 Family history of malignant neoplasm of other organs or systems: Secondary | ICD-10-CM | POA: Diagnosis not present

## 2022-11-27 DIAGNOSIS — I2510 Atherosclerotic heart disease of native coronary artery without angina pectoris: Secondary | ICD-10-CM | POA: Diagnosis present

## 2022-11-27 LAB — RESPIRATORY PANEL BY PCR

## 2022-11-27 LAB — PROTIME-INR
INR: 1.3 — ABNORMAL HIGH (ref 0.8–1.2)
Prothrombin Time: 16.4 seconds — ABNORMAL HIGH (ref 11.4–15.2)

## 2022-11-27 LAB — CBC WITH DIFFERENTIAL/PLATELET
Abs Immature Granulocytes: 0.05 10*3/uL (ref 0.00–0.07)
Basophils Absolute: 0 10*3/uL (ref 0.0–0.1)
Basophils Relative: 0 %
Eosinophils Absolute: 0 10*3/uL (ref 0.0–0.5)
Eosinophils Relative: 0 %
HCT: 34.2 % — ABNORMAL LOW (ref 39.0–52.0)
Hemoglobin: 11.5 g/dL — ABNORMAL LOW (ref 13.0–17.0)
Immature Granulocytes: 0 %
Lymphocytes Relative: 5 %
Lymphs Abs: 0.6 10*3/uL — ABNORMAL LOW (ref 0.7–4.0)
MCH: 30.7 pg (ref 26.0–34.0)
MCHC: 33.6 g/dL (ref 30.0–36.0)
MCV: 91.2 fL (ref 80.0–100.0)
Monocytes Absolute: 0.4 10*3/uL (ref 0.1–1.0)
Monocytes Relative: 4 %
Neutro Abs: 10.7 10*3/uL — ABNORMAL HIGH (ref 1.7–7.7)
Neutrophils Relative %: 91 %
Platelets: 126 10*3/uL — ABNORMAL LOW (ref 150–400)
RBC: 3.75 MIL/uL — ABNORMAL LOW (ref 4.22–5.81)
RDW: 12.5 % (ref 11.5–15.5)
WBC: 11.8 10*3/uL — ABNORMAL HIGH (ref 4.0–10.5)
nRBC: 0 % (ref 0.0–0.2)

## 2022-11-27 LAB — TECHNOLOGIST SMEAR REVIEW: Plt Morphology: NORMAL

## 2022-11-27 LAB — URINALYSIS, ROUTINE W REFLEX MICROSCOPIC
Bilirubin Urine: NEGATIVE
Glucose, UA: NEGATIVE mg/dL
Ketones, ur: NEGATIVE mg/dL
Nitrite: NEGATIVE
Protein, ur: NEGATIVE mg/dL
Specific Gravity, Urine: 1.025 (ref 1.005–1.030)
pH: 5 (ref 5.0–8.0)

## 2022-11-27 LAB — RESP PANEL BY RT-PCR (RSV, FLU A&B, COVID)  RVPGX2
Influenza A by PCR: NEGATIVE
Influenza B by PCR: NEGATIVE
Resp Syncytial Virus by PCR: NEGATIVE
SARS Coronavirus 2 by RT PCR: NEGATIVE

## 2022-11-27 LAB — MAGNESIUM: Magnesium: 1.7 mg/dL (ref 1.7–2.4)

## 2022-11-27 LAB — COMPREHENSIVE METABOLIC PANEL
ALT: 43 U/L (ref 0–44)
AST: 42 U/L — ABNORMAL HIGH (ref 15–41)
Albumin: 3.5 g/dL (ref 3.5–5.0)
Alkaline Phosphatase: 71 U/L (ref 38–126)
Anion gap: 9 (ref 5–15)
BUN: 24 mg/dL — ABNORMAL HIGH (ref 8–23)
CO2: 21 mmol/L — ABNORMAL LOW (ref 22–32)
Calcium: 7.7 mg/dL — ABNORMAL LOW (ref 8.9–10.3)
Chloride: 105 mmol/L (ref 98–111)
Creatinine, Ser: 0.98 mg/dL (ref 0.61–1.24)
GFR, Estimated: >60 mL/min (ref >60)
Glucose, Bld: 159 mg/dL — ABNORMAL HIGH (ref 70–99)
Potassium: 3.3 mmol/L — ABNORMAL LOW (ref 3.5–5.1)
Sodium: 135 mmol/L (ref 135–145)
Total Bilirubin: 2.2 mg/dL — ABNORMAL HIGH (ref 0.3–1.2)
Total Protein: 5.9 g/dL — ABNORMAL LOW (ref 6.5–8.1)

## 2022-11-27 LAB — EXPECTORATED SPUTUM ASSESSMENT W GRAM STAIN, RFLX TO RESP C

## 2022-11-27 LAB — BRAIN NATRIURETIC PEPTIDE: B Natriuretic Peptide: 114.3 pg/mL — ABNORMAL HIGH (ref 0.0–100.0)

## 2022-11-27 LAB — LACTIC ACID, PLASMA
Lactic Acid, Venous: 1.2 mmol/L (ref 0.5–1.9)
Lactic Acid, Venous: 1.1 mmol/L (ref 0.5–1.9)

## 2022-11-27 LAB — LACTATE DEHYDROGENASE: LDH: 126 U/L (ref 98–192)

## 2022-11-27 MED ORDER — CARVEDILOL 12.5 MG PO TABS
12.5000 mg | ORAL_TABLET | Freq: Two times a day (BID) | ORAL | Status: DC
  Administered 2022-11-27 – 2022-11-28 (×2): 12.5 mg via ORAL
  Filled 2022-11-27: qty 2
  Filled 2022-11-27: qty 1

## 2022-11-27 MED ORDER — TAMSULOSIN HCL 0.4 MG PO CAPS
0.4000 mg | ORAL_CAPSULE | Freq: Every day | ORAL | Status: DC
  Administered 2022-11-27 – 2022-11-28 (×2): 0.4 mg via ORAL
  Filled 2022-11-27 (×2): qty 1

## 2022-11-27 MED ORDER — ONDANSETRON HCL 4 MG PO TABS: 4.0000 mg | ORAL_TABLET | Freq: Four times a day (QID) | ORAL | Status: DC | PRN

## 2022-11-27 MED ORDER — IOHEXOL 350 MG/ML SOLN
75.0000 mL | Freq: Once | INTRAVENOUS | Status: AC | PRN
  Administered 2022-11-27: 75 mL via INTRAVENOUS

## 2022-11-27 MED ORDER — ACETAMINOPHEN 325 MG PO TABS: 650.0000 mg | ORAL_TABLET | Freq: Four times a day (QID) | ORAL | Status: DC | PRN

## 2022-11-27 MED ORDER — AZITHROMYCIN 500 MG PO TABS
500.0000 mg | ORAL_TABLET | Freq: Every day | ORAL | Status: AC
  Administered 2022-11-27: 500 mg via ORAL
  Filled 2022-11-27: qty 1

## 2022-11-27 MED ORDER — SODIUM CHLORIDE 0.9 % IV SOLN: INTRAVENOUS | Status: DC

## 2022-11-27 MED ORDER — SODIUM CHLORIDE 0.9 % IV SOLN
1.0000 g | INTRAVENOUS | Status: DC
  Administered 2022-11-27 – 2022-11-28 (×2): 1 g via INTRAVENOUS
  Filled 2022-11-27 (×2): qty 10

## 2022-11-27 MED ORDER — IPRATROPIUM-ALBUTEROL 0.5-2.5 (3) MG/3ML IN SOLN
3.0000 mL | Freq: Once | RESPIRATORY_TRACT | Status: AC
  Administered 2022-11-27: 3 mL via RESPIRATORY_TRACT
  Filled 2022-11-27: qty 3

## 2022-11-27 MED ORDER — FAMOTIDINE 20 MG PO TABS: 40.0000 mg | ORAL_TABLET | Freq: Every day | ORAL | Status: DC | PRN

## 2022-11-27 MED ORDER — MAGNESIUM HYDROXIDE 400 MG/5ML PO SUSP: 30.0000 mL | Freq: Every day | ORAL | Status: DC | PRN

## 2022-11-27 MED ORDER — ENOXAPARIN SODIUM 40 MG/0.4ML IJ SOSY
40.0000 mg | PREFILLED_SYRINGE | INTRAMUSCULAR | Status: DC
  Administered 2022-11-27 – 2022-11-28 (×2): 40 mg via SUBCUTANEOUS
  Filled 2022-11-27 (×2): qty 0.4

## 2022-11-27 MED ORDER — ATORVASTATIN CALCIUM 20 MG PO TABS
80.0000 mg | ORAL_TABLET | Freq: Every day | ORAL | Status: DC
  Administered 2022-11-27: 80 mg via ORAL
  Filled 2022-11-27: qty 4

## 2022-11-27 MED ORDER — METHYLPREDNISOLONE SODIUM SUCC 125 MG IJ SOLR: 125.0000 mg | Freq: Every day | INTRAMUSCULAR | Status: DC

## 2022-11-27 MED ORDER — IRBESARTAN 150 MG PO TABS
75.0000 mg | ORAL_TABLET | Freq: Every day | ORAL | Status: DC
  Administered 2022-11-27 – 2022-11-28 (×2): 75 mg via ORAL
  Filled 2022-11-27 (×2): qty 1

## 2022-11-27 MED ORDER — SODIUM CHLORIDE 0.9 % IV BOLUS
1000.0000 mL | Freq: Once | INTRAVENOUS | Status: AC
  Administered 2022-11-27: 1000 mL via INTRAVENOUS

## 2022-11-27 MED ORDER — HYDRALAZINE HCL 20 MG/ML IJ SOLN: 5.0000 mg | INTRAMUSCULAR | Status: DC | PRN

## 2022-11-27 MED ORDER — IPRATROPIUM-ALBUTEROL 0.5-2.5 (3) MG/3ML IN SOLN
3.0000 mL | Freq: Four times a day (QID) | RESPIRATORY_TRACT | Status: DC
  Administered 2022-11-27 – 2022-11-28 (×4): 3 mL via RESPIRATORY_TRACT
  Filled 2022-11-27 (×4): qty 3

## 2022-11-27 MED ORDER — CLOPIDOGREL BISULFATE 75 MG PO TABS
75.0000 mg | ORAL_TABLET | Freq: Every day | ORAL | Status: DC
  Administered 2022-11-27 – 2022-11-28 (×2): 75 mg via ORAL
  Filled 2022-11-27 (×2): qty 1

## 2022-11-27 MED ORDER — ACETAMINOPHEN 650 MG RE SUPP: 650.0000 mg | Freq: Four times a day (QID) | RECTAL | Status: DC | PRN

## 2022-11-27 MED ORDER — POTASSIUM CHLORIDE CRYS ER 20 MEQ PO TBCR
40.0000 meq | EXTENDED_RELEASE_TABLET | Freq: Once | ORAL | Status: AC
  Administered 2022-11-27: 40 meq via ORAL
  Filled 2022-11-27: qty 2

## 2022-11-27 MED ORDER — DM-GUAIFENESIN ER 30-600 MG PO TB12: 1.0000 | ORAL_TABLET | Freq: Two times a day (BID) | ORAL | Status: DC | PRN

## 2022-11-27 MED ORDER — METHYLPREDNISOLONE SODIUM SUCC 40 MG IJ SOLR
40.0000 mg | Freq: Two times a day (BID) | INTRAMUSCULAR | Status: DC
  Administered 2022-11-27 – 2022-11-28 (×2): 40 mg via INTRAVENOUS
  Filled 2022-11-27 (×2): qty 1

## 2022-11-27 MED ORDER — TRAZODONE HCL 50 MG PO TABS: 25.0000 mg | ORAL_TABLET | Freq: Every evening | ORAL | Status: DC | PRN

## 2022-11-27 MED ORDER — ALBUTEROL SULFATE (2.5 MG/3ML) 0.083% IN NEBU: 2.5000 mg | INHALATION_SOLUTION | RESPIRATORY_TRACT | Status: DC | PRN

## 2022-11-27 MED ORDER — ONDANSETRON HCL 4 MG/2ML IJ SOLN: 4.0000 mg | Freq: Four times a day (QID) | INTRAMUSCULAR | Status: DC | PRN

## 2022-11-27 MED ORDER — AZITHROMYCIN 250 MG PO TABS
250.0000 mg | ORAL_TABLET | Freq: Every day | ORAL | Status: DC
  Administered 2022-11-28: 250 mg via ORAL
  Filled 2022-11-27: qty 1

## 2022-11-27 MED ORDER — METHYLPREDNISOLONE SODIUM SUCC 125 MG IJ SOLR
125.0000 mg | Freq: Once | INTRAMUSCULAR | Status: AC
  Administered 2022-11-27: 125 mg via INTRAVENOUS
  Filled 2022-11-27: qty 2

## 2022-11-27 NOTE — ED Notes (Signed)
Patient transported to x-ray. ?

## 2022-11-27 NOTE — ED Notes (Signed)
Patient returned from CT

## 2022-11-27 NOTE — H&P (Signed)
History and Physical    Barry [name] [id number] DOB: 1946-08-15 DOA: 11/27/2022  Referring MD/NP/PA:   PCP: Olin Hauser, DO   Patient coming from:  The patient is coming from home.  At baseline, pt is independent for most of ADL.        Chief Complaint: Increased urinary frequency, shortness breath, chills  HPI: Barry [name] is a 77 y.o. male with medical history significant of hypertension, hyperlipidemia, dCHF, A-fib not on anticoagulants, BPH, who presents with increased urinary frequency, shortness breath, chills.  Patient states that he has increased urinary frequency for several days.  Patient was seen in the ED and diagnosed with as UTI, started on cefdinir on 11/26/2022.  He continues to have increased urinary frequency, 4 to 5 times each day, denies dysuria or burning with urination.  Patient has chills, but no fever.  Patient developed cough and shortness of breath which has been progressively worsening.  Patient coughs up little mucus, denies chest pain.  Patient is not using oxygen normally, but was found to have oxygen desaturation to 89% on room air, initially started on 15 L nonrebreather, then decrease to 2 L oxygen with 95% of saturation.  Patient also reports nausea and with few times of nonbilious nonbloody vomiting.  Patient has loose stool, no active diarrhea.  Denies abdominal pain.  When I saw patient in ED, patient is on 2 L oxygen with 95% of saturation, no respiratory distress.  ED physician documented that patient initially had mild end expiratory wheezing which has resolved after treated with bronchodilators and Solu-Medrol in ED.  Data reviewed independently and ED Course: pt was found to have WBC 11.8, BMP 114, GFR> 60, potassium 0.3, negative PCR for COVID, flu and RSV.  Temperature normal, blood pressure 131/67, heart rate 85, RR 21, 19.  Chest x-ray negative.  CTA negative for PE.  Patient is admitted to telemetry bed as  inpatient  CTA: 1. No pulmonary embolism. 2. Moderate multi-vessel coronary artery calcification. 3. Findings in keeping with mild airway inflammation.  EKG: I have personally reviewed.  Sinus rhythm, QTc 409, low voltage, nonspecific T wave change   Review of Systems:   General: no fevers, has chills, no body weight gain, has poor appetite, has fatigue HEENT: no blurry vision, hearing changes or sore throat Respiratory: has dyspnea, coughing CV: no chest pain, no palpitations GI: no nausea, vomiting, abdominal pain, diarrhea, constipation GU: no dysuria, burning on urination, has increased urinary frequency, no hematuria  Ext: no leg edema Neuro: no unilateral weakness, numbness, or tingling, no vision change or hearing loss Skin: no rash, no skin tear. MSK: No muscle spasm, no deformity, no limitation of range of movement in spin Heme: No easy bruising.  Travel history: No recent long distant travel.   Allergy:  Allergies  Allergen Reactions   Lisinopril Cough    Past Medical History:  Diagnosis Date   Arthritis    BPH (benign prostatic hypertrophy)    Coronary artery disease    Dyspnea    d/t heart problem.  worsens in hot or cold extremes   GERD (gastroesophageal reflux disease)    Hypertension    Myocardial infarction [facility])    April 2019. 1 stent placed   Wears dentures    partial upper    Past Surgical History:  Procedure Laterality Date   CATARACT EXTRACTION W/PHACO Right 04/12/2016   Procedure: CATARACT EXTRACTION PHACO AND INTRAOCULAR LENS PLACEMENT ([place]);  Surgeon: Birder Robson, MD;  Location: [facility] ORS;  Service: Ophthalmology;  Laterality: Right;  [place]' 01:05AP% 26.6CDE 17.81fuid pack lot # [id number]   CATARACT EXTRACTION W/PHACO Left 03/08/2022   Procedure: CATARACT EXTRACTION PHACO AND INTRAOCULAR LENS PLACEMENT ([facility]) LEFT 22.80 01:43.8;  Surgeon: PBirder Robson MD;  [place]  Service: Ophthalmology;  Laterality: Left;    CORONARY/GRAFT ACUTE MI REVASCULARIZATION N/A 01/16/2018   Procedure: Coronary/Graft Acute MI Revascularization;  Surgeon: ENelva Bush MD;  [facility];  Service: Cardiovascular;  Laterality: N/A;   EYE SURGERY     KNEE SURGERY Right 09/21/2012   arthroscopy   LEFT HEART CATH AND CORONARY ANGIOGRAPHY N/A 01/16/2018   Procedure: LEFT HEART CATH AND CORONARY ANGIOGRAPHY;  Surgeon: ENelva Bush MD;  [place] [facility];  Service: Cardiovascular;  Laterality: N/A;   TOTAL KNEE ARTHROPLASTY Right 05/15/2019   Procedure: TOTAL KNEE ARTHROPLASTY;  Surgeon: BLovell Sheehan MD;  Location: [facility] ORS;  Service: Orthopedics;  Laterality: Right;    Social History:  reports that he quit smoking about 48 years ago. His smoking use included cigars. He quit smokeless tobacco use about 29 years ago.  His smokeless tobacco use included chew. He reports that he does not drink alcohol and does not use drugs.  Family History:  Family History  Problem Relation Age of Onset   Diabetes Mother    Stroke Mother    Prostate cancer Brother    Melanoma Brother    Hyperlipidemia Paternal Aunt      Prior to Admission medications   Medication Sig Start Date End Date Taking? Authorizing Provider  atorvastatin (LIPITOR) 80 MG tablet Take 1 tablet (80 mg total) by mouth daily at 6 PM. 09/13/22  Yes Gollan, TKathlene November MD  carvedilol (COREG) 12.5 MG tablet Take 1 tablet (12.5 mg total) by mouth 2 (two) times daily with a meal. 10/16/22  Yes Gollan, TKathlene November MD  cefdinir (OMNICEF) 300 MG capsule Take 1 capsule (300 mg total) by mouth 2 (two) times daily for 5 days. 11/26/22 12/01/22 Yes BNaaman Plummer MD  clopidogrel (PLAVIX) 75 MG tablet Take 1 tablet (75 mg total) by mouth daily. 09/13/22  Yes Gollan, TKathlene November MD  tamsulosin (FLOMAX) 0.4 MG CAPS capsule Take 1 capsule (0.4 mg total) by mouth daily. 02/18/22  Yes Karamalegos, ADevonne Doughty DO  valsartan (DIOVAN) 40 MG tablet Take 1  tablet (40 mg total) by mouth daily. 09/13/22  Yes Gollan, TKathlene November MD  Famotidine (PEPCID AC PO) Take 1 tablet by mouth as needed.    [provider]  fluticasone (FLONASE) 50 MCG/ACT nasal spray Place 2 sprays into both nostrils daily. Use for 4-6 weeks then stop and use seasonally or as needed. 08/23/22   KOlin Hauser DO    Physical Exam: Vitals:   11/27/22 1026 11/27/22 1030 11/27/22 1130 11/27/22 1200  BP:  134/74 (!) 145/74 (!) 163/68  Pulse:  68 67 (!) 55  Resp:  17 20 20  $ Temp: 98.2 F (36.8 C)     TempSrc: Oral     SpO2:  94% 94% 97%  Weight:      Height:       General: Not in acute distress HEENT:       Eyes: PERRL, EOMI, no scleral icterus.       ENT: No discharge from the ears and nose, no pharynx injection, no tonsillar enlargement.        Neck: No JVD, no bruit, no mass felt.  Heme: No neck lymph node enlargement. Cardiac: S1/S2, RRR, No murmurs, No gallops or rubs. Respiratory: has coarse breathing sound bilaterally, no rhonchi or wheezing, or crackles. GI: Soft, nondistended, nontender, no rebound pain, no organomegaly, BS present. GU: No hematuria Ext: No pitting leg edema bilaterally. 1+DP/PT pulse bilaterally. Musculoskeletal: No joint deformities, No joint redness or warmth, no limitation of ROM in spin. Skin: No rashes.  Neuro: Alert, oriented X3, cranial nerves II-XII grossly intact, moves all extremities normally.  Psych: Patient is not psychotic, no suicidal or hemocidal ideation.  Labs on Admission: I have personally reviewed following labs and imaging studies  CBC: Recent Labs  Lab 11/27/22 0147  WBC 11.8*  NEUTROABS 10.7*  HGB 11.5*  HCT 34.2*  MCV 91.2  PLT 123XX123*   Basic Metabolic Panel: Recent Labs  Lab 11/27/22 0147  NA 135  K 3.3*  CL 105  CO2 21*  GLUCOSE 159*  BUN 24*  CREATININE 0.98  CALCIUM 7.7*  MG 1.7   GFR: Estimated Creatinine Clearance: 64.1 mL/min (by C-G formula based on SCr of 0.98  mg/dL). Liver Function Tests: Recent Labs  Lab 11/27/22 0147  AST 42*  ALT 43  ALKPHOS 71  BILITOT 2.2*  PROT 5.9*  ALBUMIN 3.5   No results for input(s): "LIPASE", "AMYLASE" in the last 168 hours. No results for input(s): "AMMONIA" in the last 168 hours. Coagulation Profile: Recent Labs  Lab 11/27/22 0147  INR 1.3*   Cardiac Enzymes: No results for input(s): "CKTOTAL", "CKMB", "CKMBINDEX", "TROPONINI" in the last 168 hours. BNP (last 3 results) No results for input(s): "PROBNP" in the last 8760 hours. HbA1C: No results for input(s): "HGBA1C" in the last 72 hours. CBG: No results for input(s): "GLUCAP" in the last 168 hours. Lipid Profile: No results for input(s): "CHOL", "HDL", "LDLCALC", "TRIG", "CHOLHDL", "LDLDIRECT" in the last 72 hours. Thyroid Function Tests: No results for input(s): "TSH", "T4TOTAL", "FREET4", "T3FREE", "THYROIDAB" in the last 72 hours. Anemia Panel: No results for input(s): "VITAMINB12", "FOLATE", "FERRITIN", "TIBC", "IRON", "RETICCTPCT" in the last 72 hours. Urine analysis:    Component Value Date/Time   COLORURINE YELLOW (A) 11/27/2022 0408   APPEARANCEUR HAZY (A) 11/27/2022 0408   APPEARANCEUR CLEAR 02/13/2015 1356   LABSPEC 1.025 11/27/2022 0408   LABSPEC >=1.030 02/13/2015 1356   PHURINE 5.0 11/27/2022 0408   GLUCOSEU NEGATIVE 11/27/2022 0408   GLUCOSEU NEGATIVE 02/13/2015 1356   HGBUR MODERATE (A) 11/27/2022 0408   BILIRUBINUR NEGATIVE 11/27/2022 0408   BILIRUBINUR NEGATIVE 02/13/2015 1356   KETONESUR NEGATIVE 11/27/2022 0408   PROTEINUR NEGATIVE 11/27/2022 0408   NITRITE NEGATIVE 11/27/2022 0408   LEUKOCYTESUR SMALL (A) 11/27/2022 0408   LEUKOCYTESUR NEGATIVE 02/13/2015 1356   Sepsis Labs: @LABRCNTIP$ (procalcitonin:4,lacticidven:4) ) Recent Results (from the past 240 hour(s))  Culture, blood (Routine x 2)     Status: None (Preliminary result)   Collection Time: 11/27/22  1:47 AM   Specimen: BLOOD RIGHT ARM  Result Value Ref  Range Status   Specimen Description BLOOD RIGHT ARM  Final   Special Requests   Final    BOTTLES DRAWN AEROBIC AND ANAEROBIC Blood Culture adequate volume   Culture   Final    NO GROWTH < 12 HOURS Performed at [facility], [address] [place], [place] [address]    Report Status PENDING  Incomplete  Culture, blood (Routine x 2)     Status: None (Preliminary result)   Collection Time: 11/27/22  1:47 AM   Specimen: BLOOD LEFT HAND  Result Value Ref Range Status   Specimen Description BLOOD LEFT HAND  Final   Special Requests   Final    BOTTLES DRAWN AEROBIC AND ANAEROBIC Blood Culture adequate volume   Culture   Final    NO GROWTH < 12 HOURS Performed at [facility], [address], [place], [place] [address]    Report Status PENDING  Incomplete  Resp panel by RT-PCR (RSV, Flu A&B, Covid) Anterior Nasal Swab     Status: None   Collection Time: 11/27/22  1:49 AM   Specimen: Anterior Nasal Swab  Result Value Ref Range Status   SARS Coronavirus 2 by RT PCR NEGATIVE NEGATIVE Final    Comment: (NOTE) SARS-CoV-2 target nucleic acids are NOT DETECTED.  The SARS-CoV-2 RNA is generally detectable in upper respiratory specimens during the acute phase of infection. The lowest concentration of SARS-CoV-2 viral copies this assay can detect is 138 copies/mL. A negative result does not preclude SARS-Cov-2 infection and should not be used as the sole basis for treatment or other patient management decisions. A negative result may occur with  improper specimen collection/handling, submission of specimen other than nasopharyngeal swab, presence of viral mutation(s) within the areas targeted by this assay, and inadequate number of viral copies(<138 copies/mL). A negative result must be combined with clinical observations, patient history, and epidemiological information. The expected result is Negative.  Fact Sheet for Patients:   EntrepreneurPulse.com.au  Fact Sheet for Healthcare Providers:  IncredibleEmployment.be  This test is no t yet approved or cleared by the [place] FDA and  has been authorized for detection and/or diagnosis of SARS-CoV-2 by FDA under an Emergency Use Authorization (EUA). This EUA will remain  in effect (meaning this test can be used) for the duration of the COVID-19 declaration under Section 564(b)(1) of the Act, 21 U.S.C.section 360bbb-3(b)(1), unless the authorization is terminated  or revoked sooner.       Influenza A by PCR NEGATIVE NEGATIVE Final   Influenza B by PCR NEGATIVE NEGATIVE Final    Comment: (NOTE) The Xpert Xpress SARS-CoV-2/FLU/RSV plus assay is intended as an aid in the diagnosis of influenza from Nasopharyngeal swab specimens and should not be used as a sole basis for treatment. Nasal washings and aspirates are unacceptable for Xpert Xpress SARS-CoV-2/FLU/RSV testing.  Fact Sheet for Patients: EntrepreneurPulse.com.au  Fact Sheet for Healthcare Providers: IncredibleEmployment.be  This test is not yet approved or cleared by the [place] FDA and has been authorized for detection and/or diagnosis of SARS-CoV-2 by FDA under an Emergency Use Authorization (EUA). This EUA will remain in effect (meaning this test can be used) for the duration of the COVID-19 declaration under Section 564(b)(1) of the Act, 21 U.S.C. section 360bbb-3(b)(1), unless the authorization is terminated or revoked.     Resp Syncytial Virus by PCR NEGATIVE NEGATIVE Final    Comment: (NOTE) Fact Sheet for Patients: EntrepreneurPulse.com.au  Fact Sheet for Healthcare Providers: IncredibleEmployment.be  This test is not yet approved or cleared by the [place] FDA and has been authorized for detection and/or diagnosis of SARS-CoV-2 by FDA under an Emergency Use  Authorization (EUA). This EUA will remain in effect (meaning this test can be used) for the duration of the COVID-19 declaration under Section 564(b)(1) of the Act, 21 U.S.C. section 360bbb-3(b)(1), unless the authorization is terminated or revoked.  Performed at [facility], [address], [place], [place] [address]      Radiological Exams on Admission: CT Angio Chest PE W/Cm &/  Or Wo Cm  Result Date: 11/27/2022 CLINICAL DATA:  Pulmonary embolism (PE) suspected, high prob. Dyspnea, chest pain, vomiting EXAM: CT ANGIOGRAPHY CHEST WITH CONTRAST TECHNIQUE: Multidetector CT imaging of the chest was performed using the standard protocol during bolus administration of intravenous contrast. Multiplanar CT image reconstructions and MIPs were obtained to evaluate the vascular anatomy. RADIATION DOSE REDUCTION: This exam was performed according to the departmental dose-optimization program which includes automated exposure control, adjustment of the mA and/or kV according to patient size and/or use of iterative reconstruction technique. CONTRAST:  18m OMNIPAQUE IOHEXOL 350 MG/ML SOLN COMPARISON:  None Available. FINDINGS: Cardiovascular: Moderate multi-vessel coronary artery calcification. Global cardiac size within normal limits. No pericardial effusion. Central pulmonary arteries are of normal caliber. No intraluminal filling defect identified to suggest acute pulmonary embolism. The thoracic aorta is unremarkable. Mediastinum/Nodes: No enlarged mediastinal, hilar, or axillary lymph nodes. Thyroid gland, trachea, and esophagus demonstrate no significant findings. Lungs/Pleura: There is central bronchial wall thickening as well as scattered areas of peripheral airway impaction within the lower lobes bilaterally, most prevalent within the posterior basal segment of the right lower lobe in keeping with changes of airway inflammation. Mild bibasilar dependent atelectasis. No pneumothorax or pleural  effusion. No central obstructing lesion. Upper Abdomen: No acute abnormality. Musculoskeletal: No acute bone abnormality. No lytic or blastic bone lesion. Osseous structures are age-appropriate. Review of the MIP images confirms the above findings. IMPRESSION: 1. No pulmonary embolism. 2. Moderate multi-vessel coronary artery calcification. 3. Findings in keeping with mild airway inflammation. Electronically Signed   By: AFidela SalisburyM.D.   On: 11/27/2022 04:00   DG Chest 2 View  Result Date: 11/27/2022 CLINICAL DATA:  Sepsis, dyspnea EXAM: CHEST - 2 VIEW COMPARISON:  None Available. FINDINGS: The heart size and mediastinal contours are within normal limits. Both lungs are clear. The visualized skeletal structures are unremarkable. IMPRESSION: No active cardiopulmonary disease. Electronically Signed   By: AFidela SalisburyM.D.   On: 11/27/2022 02:22      Assessment/Plan Principal Problem:   Acute bronchitis Active Problems:   Paroxysmal atrial fibrillation (HCC)   Essential hypertension   CAD (coronary artery disease)   Chronic diastolic CHF (congestive heart failure) (HCC)   BPH (benign prostatic hyperplasia)   UTI (urinary tract infection)   Hypokalemia   Thrombocytopenia (HCC)   Assessment and Plan:  Acute bronchitis: Patient likely has acute bronchitis.  Initially patient had mild and expiratory wheezing per ED physician's documentation.  CTA negative for PE, but showed mild airway inflammation.  Patient has 2 L new oxygen requirement currently.  - will admit to tele bed as inpatient -Bronchodilators -Solu-Medrol 40 mg IV bid -Z pak and IV Rocephin -Mucinex for cough  -Incentive spirometry -sputum culture -Nasal cannula oxygen as needed to maintain O2 saturation 93% or greater -check RVP  Paroxysmal atrial [place]: pt is on plavix. HR 85 now -Continue Coreg  Essential hypertension -IV hydralazine as needed -Irbesartan (taking Diovan at home)  CAD (coronary  artery disease): no CP -lipitor and plavix  Chronic diastolic CHF (congestive heart failure) ([facility]: Patient does not have leg edema or JVD.  BNP 114, does not seem to have CHF exacerbation. -Watch volume status closely  BPH (benign prostatic hyperplasia) -Flomax  UTI (urinary tract infection) -On IV Rocephin -Follow-up urine culture  Hypokalemia: Potassium 3.3 -Repleted potassium -Check magnesium level --> 1.7  [place]rombocyt[place] ([place] Platelet 126.  Mental status normal.  Likely due to ongoing infection. -Check LDH -Follow-up peripheral smear  DVT ppx: SQ Lovenox  Code Status: Full code  Family Communication:   Yes, patient's daughter    at bed side.        Disposition Plan:  Anticipate discharge back to previous environment  Consults called:  none  Admission status and Level of care: Telemetry Medical:    as inpt      Dispo: The patient is from: Home              Anticipated d/c is to: Home              Anticipated d/c date is: 2 days              Patient currently is not medically stable to d/c.    Severity of Illness:  The appropriate patient status for this patient is INPATIENT. Inpatient status is judged to be reasonable and necessary in order to provide the required intensity of service to ensure the patient's safety. The patient's presenting symptoms, physical exam findings, and initial radiographic and laboratory data in the context of their chronic comorbidities is felt to place them at high risk for further clinical deterioration. Furthermore, it is not anticipated that the patient will be medically stable for discharge from the hospital within 2 midnights of admission.   * I certify that at the point of admission it is my clinical judgment that the patient will require inpatient hospital care spanning beyond 2 midnights from the point of admission due to high intensity of service, high risk for further deterioration and high frequency of surveillance  required.*       Date of Service 11/27/2022    Ivor Costa [facility]   If 7PM-7AM, please contact night-coverage [email or web address] 11/27/2022, 12:25 PM

## 2022-11-27 NOTE — ED Notes (Signed)
Patient ambulated independently to restroom.

## 2022-11-27 NOTE — ED Triage Notes (Signed)
Patient arrives via EMS from home for cc of SOB and generalized pain. Patient seen here yesterday for UTI, was given abx and discharged. Patient now complains of emesis today. Spo2 89% on RA, patient arrives on 15L NRB.  A+OX4, 97% on 4L in ER.

## 2022-11-27 NOTE — ED Notes (Signed)
Patient reminded of need for urine sample, states unable to urinate at this time.

## 2022-11-27 NOTE — ED Notes (Signed)
Patient transported to CT 

## 2022-11-27 NOTE — ED Notes (Signed)
Patient returned from xray.

## 2022-11-27 NOTE — ED Notes (Signed)
ED Provider at bedside. 

## 2022-11-27 NOTE — ED Provider Notes (Signed)
[facility] Provider Note   Event Date/Time   First MD Initiated Contact with Patient 11/27/22 [phone]     (approximate) History  Shortness of Breath and Weakness  HPI Barry [name] is a 77 y.o. male with a stated past medical history of type 2 diabetes, paroxysmal atrial fibrillation, GERD, and hypertension who presents for shortness of breath and generalized weakness that has been present over the last 2 days but worsened over the last 24 hours.  Patient was recently seen yesterday and diagnosed with a UTI after presenting with similar symptoms however patient denied shortness of breath at that time.  Patient states that since starting antibiotics he has had worsening shortness of breath, dyspnea on exertion, and when trying to sleep tonight had an episode of significant dyspnea before calling EMS.  EMS found patient's oxygen saturation to be in the upper 80s and placed patient on 4 L nasal cannula with improvement to the mid 90s. ROS: Patient currently denies any vision changes, tinnitus, difficulty speaking, facial droop, sore throat, chest pain,  abdominal pain, nausea/vomiting/diarrhea, dysuria, or weakness/numbness/paresthesias in any extremity   Physical Exam  Triage Vital Signs: ED Triage Vitals  Enc Vitals Group     BP 11/27/22 0141 (!) 118/55     Pulse Rate 11/27/22 0141 85     Resp 11/27/22 0141 20     Temp 11/27/22 0141 99.5 F (37.5 C)     Temp Source 11/27/22 0141 Oral     SpO2 11/27/22 0141 98 %     Weight 11/27/22 0142 184 lb 1.6 oz (83.5 kg)     Height 11/27/22 0142 5' 9"$  (1.753 m)     Head Circumference --      Peak Flow --      Pain Score 11/27/22 0142 0     Pain Loc --      Pain Edu? --      Excl. in [place]? --    Most recent vital signs: Vitals:   11/28/22 0753 11/28/22 0802  BP: (!) 145/74   Pulse: 70   Resp: 18   Temp: (!) 97.4 F (36.3 C)   SpO2: 98% 99%   General: Awake, oriented x4. CV:  Good peripheral perfusion.   Resp:  Normal effort.  Mild end expiratory wheezing Abd:  No distention.  Other:  Elderly Caucasian well-developed, well-nourished male laying in bed in no acute distress ED Results / Procedures / Treatments  Labs (all labs ordered are listed, but only abnormal results are displayed) Labs Reviewed  URINE CULTURE - Abnormal; Notable for the following components:      Result Value   Culture   (*)    Value: <10,000 COLONIES/mL INSIGNIFICANT GROWTH Performed at [facility], [address] [place], [place] [address]    All other components within normal limits  COMPREHENSIVE METABOLIC PANEL - Abnormal; Notable for the following components:   Potassium 3.3 (*)    CO2 21 (*)    Glucose, Bld 159 (*)    BUN 24 (*)    Calcium 7.7 (*)    Total Protein 5.9 (*)    AST 42 (*)    Total Bilirubin 2.2 (*)    All other components within normal limits  CBC WITH DIFFERENTIAL/PLATELET - Abnormal; Notable for the following components:   WBC 11.8 (*)    RBC 3.75 (*)    Hemoglobin 11.5 (*)    HCT 34.2 (*)    Platelets 126 (*)  Neutro Abs 10.7 (*)    Lymphs Abs 0.6 (*)    All other components within normal limits  PROTIME-INR - Abnormal; Notable for the following components:   Prothrombin Time 16.4 (*)    INR 1.3 (*)    All other components within normal limits  URINALYSIS, ROUTINE W REFLEX MICROSCOPIC - Abnormal; Notable for the following components:   Color, Urine YELLOW (*)    APPearance HAZY (*)    Hgb urine dipstick MODERATE (*)    Leukocytes,Ua SMALL (*)    Bacteria, UA RARE (*)    All other components within normal limits  BRAIN NATRIURETIC PEPTIDE - Abnormal; Notable for the following components:   B Natriuretic Peptide 114.3 (*)    All other components within normal limits  BASIC METABOLIC PANEL - Abnormal; Notable for the following components:   CO2 21 (*)    Glucose, Bld 244 (*)    Calcium 8.0 (*)    All other components within normal limits  CBC - Abnormal; Notable  for the following components:   WBC 14.1 (*)    RBC 3.89 (*)    Hemoglobin 11.8 (*)    HCT 34.5 (*)    Platelets 139 (*)    All other components within normal limits  CULTURE, BLOOD (ROUTINE X 2)  CULTURE, BLOOD (ROUTINE X 2)  RESP PANEL BY RT-PCR (RSV, FLU A&B, COVID)  [id number]  EXPECTORATED SPUTUM ASSESSMENT W GRAM STAIN, RFLX TO RESP C  RESPIRATORY PANEL BY PCR  EXPECTORATED SPUTUM ASSESSMENT W GRAM STAIN, RFLX TO RESP C  LACTIC ACID, PLASMA  LACTIC ACID, PLASMA  MAGNESIUM  LACTATE DEHYDROGENASE  TECHNOLOGIST SMEAR REVIEW   EKG ED ECG REPORT I, Naaman Plummer, the attending physician, personally viewed and interpreted this ECG. Date: 11/27/2022 EKG Time: 0145 Rate: 75 Rhythm: normal sinus rhythm QRS Axis: normal Intervals: normal ST/T Wave abnormalities: normal Narrative Interpretation: no evidence of acute ischemia RADIOLOGY ED MD interpretation: CT angiography of the chest interpreted independently by me and shows no evidence of acute abnormalities.  There is mild airway inflammation  2 view chest x-ray interpreted by me shows no evidence of acute abnormalities including no pneumonia, pneumothorax, or widened mediastinum -Agree with radiology assessment Official radiology report(s): No results found. PROCEDURES: Critical Care performed: Yes, see critical care procedure note(s) .1-3 Lead EKG Interpretation  Performed by: Naaman Plummer, MD Authorized by: Naaman Plummer, MD     Interpretation: normal     ECG rate:  71   ECG rate assessment: normal     Rhythm: sinus rhythm     Ectopy: none     Conduction: normal   CRITICAL CARE Performed by: Naaman Plummer  Total critical care time: 70 minutes  Critical care time was exclusive of separately billable procedures and treating other patients.  Critical care was necessary to treat or prevent imminent or life-threatening deterioration.  Critical care was time spent personally by me on the following activities:  development of treatment plan with patient and/or surrogate as well as nursing, discussions with consultants, evaluation of patient's response to treatment, examination of patient, obtaining history from patient or surrogate, ordering and performing treatments and interventions, ordering and review of laboratory studies, ordering and review of radiographic studies, pulse oximetry and re-evaluation of patient's condition.  MEDICATIONS ORDERED IN ED: Medications  sodium chloride 0.9 % bolus 1,000 mL (0 mLs Intravenous Stopped 11/27/22 0431)  iohexol (OMNIPAQUE) 350 MG/ML injection 75 mL (75 mLs Intravenous Contrast Given 11/27/22  [id number])  ipratropium-albuterol (DUONEB) 0.5-2.5 (3) MG/3ML nebulizer solution 3 mL (3 mLs Nebulization Given 11/27/22 0430)  methylPREDNISolone sodium succinate (SOLU-MEDROL) 125 mg/2 mL injection 125 mg (125 mg Intravenous Given 11/27/22 0431)  potassium chloride SA (KLOR-CON M) CR tablet 40 mEq (40 mEq Oral Given 11/27/22 0913)  azithromycin (ZITHROMAX) tablet 500 mg (500 mg Oral Given 11/27/22 0912)  fosfomycin (MONUROL) packet 3 g (3 g Oral Given 11/28/22 1243)   IMPRESSION / MDM / ASSESSMENT AND PLAN / ED COURSE  I reviewed the triage vital signs and the nursing notes.                             The patient is on the cardiac monitor to evaluate for evidence of arrhythmia and/or significant heart rate changes. Patient's presentation is most consistent with acute presentation with potential threat to life or bodily function.  This patient presents to the ED for concern of shortness of breath and hypoxia, this involves an extensive number of treatment options, and is a complaint that carries with it a high risk of complications and morbidity.  The differential diagnosis includes upper respiratory infection, PE, ACS, pneumothorax Co morbidities that complicate the patient evaluation  Hypertension Additional history obtained:  Additional history obtained from  patient  External records from outside source obtained and reviewed including ER note from yesterday Lab Tests:  I Ordered, and personally interpreted labs.  The pertinent results include: BNP 114, potassium 3.3 Imaging Studies ordered:  I ordered imaging studies including chest x-ray and CT angiography of the chest  I independently visualized and interpreted imaging which showed no evidence of acute abnormalities  I agree with the radiologist interpretation Cardiac Monitoring: / EKG:  The patient was maintained on a cardiac monitor.  I personally viewed and interpreted the cardiac monitored which showed an underlying rhythm of: Normal sinus rhythm Consultations Obtained:  I requested consultation with the hospitalist,  and discussed lab and imaging findings as well as pertinent plan - they recommend: Admission Problem List / ED Course / Critical interventions / Medication management  Acute hypoxic respiratory failure  I ordered medication including supplemental oxygen for hypoxia  Reevaluation of the patient after these medicines showed that the patient improved  I have reviewed the patients home medicines and have made adjustments as needed Dispo: Admit to medicine       FINAL CLINICAL IMPRESSION(S) / ED DIAGNOSES   Final diagnoses:  Acute respiratory failure with hypoxia ([place])   Rx / DC Orders   ED Discharge Orders          Ordered    azithromycin (ZITHROMAX) 250 MG tablet        11/28/22 1134    dextromethorphan-guaiFENesin (MUCINEX DM) 30-600 MG 12hr tablet  2 times daily PRN        11/28/22 1134    Increase activity slowly        11/28/22 1134    Diet - low sodium heart healthy        11/28/22 1134    Discharge instructions       Comments: It was pleasure taking care of you. You have been given Zithromax for 3 more days, start taking from tomorrow. We did not give you any steroid as there was no wheezing and your concern of increased blood sugar levels  which is a common side effect. You was given a dose of fosfomycin for concern of UTI, no need any  further antibiotics for that reason. Keep yourself well-hydrated and continue taking rest of your home medications. Follow-up with your doctors closely for further recommendations.   11/28/22 1134           Note:  This document was prepared using Dragon voice recognition software and may include unintentional dictation errors.   Naaman Plummer, MD 11/29/22 [phone]

## 2022-11-27 NOTE — ED Notes (Signed)
Patient updated on status. Denies questions/needs at this time.

## 2022-11-27 NOTE — ED Notes (Signed)
Patient eating breakfast tray at this time. 

## 2022-11-28 DIAGNOSIS — J209 Acute bronchitis, unspecified: Secondary | ICD-10-CM | POA: Diagnosis not present

## 2022-11-28 DIAGNOSIS — I5032 Chronic diastolic (congestive) heart failure: Secondary | ICD-10-CM | POA: Diagnosis not present

## 2022-11-28 DIAGNOSIS — I10 Essential (primary) hypertension: Secondary | ICD-10-CM | POA: Diagnosis not present

## 2022-11-28 DIAGNOSIS — I480 Paroxysmal atrial fibrillation: Secondary | ICD-10-CM | POA: Diagnosis not present

## 2022-11-28 LAB — URINE CULTURE: Culture: <10000 — AB

## 2022-11-28 LAB — CBC
HCT: 34.5 % — ABNORMAL LOW (ref 39.0–52.0)
Hemoglobin: 11.8 g/dL — ABNORMAL LOW (ref 13.0–17.0)
MCH: 30.3 pg (ref 26.0–34.0)
MCHC: 34.2 g/dL (ref 30.0–36.0)
MCV: 88.7 fL (ref 80.0–100.0)
Platelets: 139 10*3/uL — ABNORMAL LOW (ref 150–400)
RBC: 3.89 MIL/uL — ABNORMAL LOW (ref 4.22–5.81)
RDW: 12.3 % (ref 11.5–15.5)
WBC: 14.1 10*3/uL — ABNORMAL HIGH (ref 4.0–10.5)
nRBC: 0 % (ref 0.0–0.2)

## 2022-11-28 LAB — BASIC METABOLIC PANEL
Anion gap: 9 (ref 5–15)
BUN: 23 mg/dL (ref 8–23)
CO2: 21 mmol/L — ABNORMAL LOW (ref 22–32)
Calcium: 8 mg/dL — ABNORMAL LOW (ref 8.9–10.3)
Chloride: 109 mmol/L (ref 98–111)
Creatinine, Ser: 0.95 mg/dL (ref 0.61–1.24)
GFR, Estimated: >60 mL/min (ref >60)
Glucose, Bld: 244 mg/dL — ABNORMAL HIGH (ref 70–99)
Potassium: 3.5 mmol/L (ref 3.5–5.1)
Sodium: 139 mmol/L (ref 135–145)

## 2022-11-28 MED ORDER — AZITHROMYCIN 250 MG PO TABS: ORAL_TABLET | ORAL | 0 refills | Status: DC

## 2022-11-28 MED ORDER — DM-GUAIFENESIN ER 30-600 MG PO TB12: 1.0000 | ORAL_TABLET | Freq: Two times a day (BID) | ORAL | 0 refills | Status: DC | PRN

## 2022-11-28 MED ORDER — FOSFOMYCIN TROMETHAMINE 3 G PO PACK
3.0000 g | PACK | Freq: Once | ORAL | Status: AC
  Administered 2022-11-28: 3 g via ORAL
  Filled 2022-11-28: qty 3

## 2022-11-28 NOTE — Hospital Course (Addendum)
Taken from H&P.  Barry [name] is a 77 y.o. male with medical history significant of hypertension, hyperlipidemia, dCHF, A-fib not on anticoagulants, BPH, who presents with increased urinary frequency, shortness breath, chills.   Patient states that he has increased urinary frequency for several days.  Patient was seen in the ED and diagnosed with as UTI, started on cefdinir on 11/26/2022.  He continues to have increased urinary frequency, 4 to 5 times each day, denies dysuria or burning with urination.  Patient has chills, but no fever.  Patient developed worsening shortness of breath and cough. was found to have oxygen desaturation to 89% on room air, initially started on 15 L nonrebreather, then decrease to 2 L oxygen with 95% of saturation.  Patient also reports nausea and with few times of nonbilious nonbloody vomiting.  Patient has loose stool, no active diarrhea.   Data reviewed independently and ED Course: pt was found to have WBC 11.8, BMP 114, GFR> 60, potassium 0.3, negative PCR for COVID, flu and RSV.  Temperature normal, blood pressure 131/67, heart rate 85, RR 21, 19.  Chest x-ray negative.  CTA negative for PE.  Patient is admitted to telemetry bed as inpatient   CTA: 1. No pulmonary embolism. 2. Moderate multi-vessel coronary artery calcification. 3. Findings in keeping with mild airway inflammation.   EKG:  Sinus rhythm, QTc 409, low voltage, nonspecific T wave change.  2/12: Vitals mostly stable, saturating well on room air, respiratory viral panel negative.  Normal blood cells morphology, LDH normal, BNP mildly elevated at 114, preliminary blood cultures negative in 1 day.  Urine cultures with insignificant growth but patient was already on antibiotics before they were taken.  Patient was concern of not feeling well after taking cefdinir.  He received ceftriaxone while in the hospital and was given a dose of fosfomycin due to urinary symptoms.  Patient has a cardiac monitor  in place, he was concern of variability in his heart rate, advised to follow-up with his cardiologist as he might be having sick sinus syndrome.  He was also concerned about elevated blood glucose level.  He is not on any antidiabetic medications.  There was no wheezing and he was saturating well on room air.  We did not continue steroid and he was given 3 more days of Zithromax for its anti-inflammatory effect on lungs.  Patient will continue with rest of his home medications and need to have a close follow-up with his providers for further recommendations.

## 2022-11-28 NOTE — Discharge Summary (Signed)
Physician Discharge Summary   Patient: Barry [name] MRN: [id number] DOB: Jan 06, 1946  Admit date:     11/27/2022  Discharge date: 11/28/22  Discharge Physician: Lorella Nimrod   PCP: Olin Hauser, DO   Recommendations at discharge:  Please obtain CBC and BMP in 1 week Follow-up with cardiology for concern of variable heart rate Follow-up with primary care provider  Discharge Diagnoses: Principal Problem:   Acute bronchitis Active Problems:   Paroxysmal atrial fibrillation (HCC)   Essential hypertension   CAD (coronary artery disease)   Chronic diastolic CHF (congestive heart failure) (HCC)   BPH (benign prostatic hyperplasia)   UTI (urinary tract infection)   Hypokalemia   Thrombocytopenia [facility])   Hospital Course: Taken from H&P.  Barry [name] is a 77 y.o. male with medical history significant of hypertension, hyperlipidemia, dCHF, A-fib not on anticoagulants, BPH, who presents with increased urinary frequency, shortness breath, chills.   Patient states that he has increased urinary frequency for several days.  Patient was seen in the ED and diagnosed with as UTI, started on cefdinir on 11/26/2022.  He continues to have increased urinary frequency, 4 to 5 times each day, denies dysuria or burning with urination.  Patient has chills, but no fever.  Patient developed worsening shortness of breath and cough. was found to have oxygen desaturation to 89% on room air, initially started on 15 L nonrebreather, then decrease to 2 L oxygen with 95% of saturation.  Patient also reports nausea and with few times of nonbilious nonbloody vomiting.  Patient has loose stool, no active diarrhea.   Data reviewed independently and ED Course: pt was found to have WBC 11.8, BMP 114, GFR> 60, potassium 0.3, negative PCR for COVID, flu and RSV.  Temperature normal, blood pressure 131/67, heart rate 85, RR 21, 19.  Chest x-ray negative.  CTA negative for PE.  Patient is admitted to  telemetry bed as inpatient   CTA: 1. No pulmonary embolism. 2. Moderate multi-vessel coronary artery calcification. 3. Findings in keeping with mild airway inflammation.   EKG:  Sinus rhythm, QTc 409, low voltage, nonspecific T wave change.  2/12: Vitals mostly stable, saturating well on room air, respiratory viral panel negative.  Normal blood cells morphology, LDH normal, BNP mildly elevated at 114, preliminary blood cultures negative in 1 day.  Urine cultures with insignificant growth but patient was already on antibiotics before they were taken.  Patient was concern of not feeling well after taking cefdinir.  He received ceftriaxone while in the hospital and was given a dose of fosfomycin due to urinary symptoms.  Patient has a cardiac monitor in place, he was concern of variability in his heart rate, advised to follow-up with his cardiologist as he might be having sick sinus syndrome.  He was also concerned about elevated blood glucose level.  He is not on any antidiabetic medications.  There was no wheezing and he was saturating well on room air.  We did not continue steroid and he was given 3 more days of Zithromax for its anti-inflammatory effect on lungs.  Patient will continue with rest of his home medications and need to have a close follow-up with his providers for further recommendations.   Consultants: None Procedures performed: None Disposition: Home Diet recommendation:  Discharge Diet Orders (From admission, onward)     Start     Ordered   11/28/22 0000  Diet - low sodium heart healthy        11/28/22 1134  Cardiac and Carb modified diet DISCHARGE MEDICATION: Allergies as of 11/28/2022       Reactions   Lisinopril Cough        Medication List     STOP taking these medications    cefdinir 300 MG capsule Commonly known as: OMNICEF       TAKE these medications    atorvastatin 80 MG tablet Commonly known as: LIPITOR Take 1 tablet (80 mg  total) by mouth daily at 6 PM.   azithromycin 250 MG tablet Commonly known as: ZITHROMAX Take 1 tablet daily for next 3 days. Start taking on: November 29, 2022   carvedilol 12.5 MG tablet Commonly known as: COREG Take 1 tablet (12.5 mg total) by mouth 2 (two) times daily with a meal.   clopidogrel 75 MG tablet Commonly known as: PLAVIX Take 1 tablet (75 mg total) by mouth daily.   dextromethorphan-guaiFENesin 30-600 MG 12hr tablet Commonly known as: MUCINEX DM Take 1 tablet by mouth 2 (two) times daily as needed for cough.   fluticasone 50 MCG/ACT nasal spray Commonly known as: FLONASE Place 2 sprays into both nostrils daily. Use for 4-6 weeks then stop and use seasonally or as needed.   PEPCID AC PO Take 1 tablet by mouth as needed.   tamsulosin 0.4 MG Caps capsule Commonly known as: FLOMAX Take 1 capsule (0.4 mg total) by mouth daily.   valsartan 40 MG tablet Commonly known as: Diovan Take 1 tablet (40 mg total) by mouth daily.        Follow-up Information     Olin Hauser, DO. Schedule an appointment as soon as possible for a visit in 1 week(s).   Specialty: Family Medicine Contact information: [place] [address] [phone]         Minna Merritts, MD Follow up in 1 week(s).   Specialty: Cardiology Contact information: [place] [address] [phone]                Discharge Exam: Danley Danker Weights   11/27/22 0142  Weight: 83.5 kg   General.     In no acute distress. Pulmonary.  Lungs clear bilaterally, normal respiratory effort. CV.  Regular rate and rhythm, no JVD, rub or murmur. Abdomen.  Soft, nontender, nondistended, BS positive. CNS.  Alert and oriented .  No focal neurologic deficit. Extremities.  No edema, no cyanosis, pulses intact and symmetrical. Psychiatry.  Judgment and insight appears normal.   Condition at discharge: stable  The results of significant diagnostics  from this hospitalization (including imaging, microbiology, ancillary and laboratory) are listed below for reference.   Imaging Studies: CT Angio Chest PE W/Cm &/Or Wo Cm  Result Date: 11/27/2022 CLINICAL DATA:  Pulmonary embolism (PE) suspected, high prob. Dyspnea, chest pain, vomiting EXAM: CT ANGIOGRAPHY CHEST WITH CONTRAST TECHNIQUE: Multidetector CT imaging of the chest was performed using the standard protocol during bolus administration of intravenous contrast. Multiplanar CT image reconstructions and MIPs were obtained to evaluate the vascular anatomy. RADIATION DOSE REDUCTION: This exam was performed according to the departmental dose-optimization program which includes automated exposure control, adjustment of the mA and/or kV according to patient size and/or use of iterative reconstruction technique. CONTRAST:  34m OMNIPAQUE IOHEXOL 350 MG/ML SOLN COMPARISON:  None Available. FINDINGS: Cardiovascular: Moderate multi-vessel coronary artery calcification. Global cardiac size within normal limits. No pericardial effusion. Central pulmonary arteries are of normal caliber. No intraluminal filling defect identified to suggest acute pulmonary embolism.  The thoracic aorta is unremarkable. Mediastinum/Nodes: No enlarged mediastinal, hilar, or axillary lymph nodes. Thyroid gland, trachea, and esophagus demonstrate no significant findings. Lungs/Pleura: There is central bronchial wall thickening as well as scattered areas of peripheral airway impaction within the lower lobes bilaterally, most prevalent within the posterior basal segment of the right lower lobe in keeping with changes of airway inflammation. Mild bibasilar dependent atelectasis. No pneumothorax or pleural effusion. No central obstructing lesion. Upper Abdomen: No acute abnormality. Musculoskeletal: No acute bone abnormality. No lytic or blastic bone lesion. Osseous structures are age-appropriate. Review of the MIP images confirms the above  findings. IMPRESSION: 1. No pulmonary embolism. 2. Moderate multi-vessel coronary artery calcification. 3. Findings in keeping with mild airway inflammation. Electronically Signed   By: Fidela Salisbury M.D.   On: 11/27/2022 04:00   DG Chest 2 View  Result Date: 11/27/2022 CLINICAL DATA:  Sepsis, dyspnea EXAM: CHEST - 2 VIEW COMPARISON:  None Available. FINDINGS: The heart size and mediastinal contours are within normal limits. Both lungs are clear. The visualized skeletal structures are unremarkable. IMPRESSION: No active cardiopulmonary disease. Electronically Signed   By: Fidela Salisbury M.D.   On: 11/27/2022 02:22    Microbiology: Results for orders placed or performed during the hospital encounter of 11/27/22  Culture, blood (Routine x 2)     Status: None (Preliminary result)   Collection Time: 11/27/22  1:47 AM   Specimen: BLOOD RIGHT ARM  Result Value Ref Range Status   Specimen Description BLOOD RIGHT ARM  Final   Special Requests   Final    BOTTLES DRAWN AEROBIC AND ANAEROBIC Blood Culture adequate volume   Culture   Final    NO GROWTH 1 DAY Performed at [facility], [address] [place], [place] [address]    Report Status PENDING  Incomplete  Culture, blood (Routine x 2)     Status: None (Preliminary result)   Collection Time: 11/27/22  1:47 AM   Specimen: BLOOD LEFT HAND  Result Value Ref Range Status   Specimen Description BLOOD LEFT HAND  Final   Special Requests   Final    BOTTLES DRAWN AEROBIC AND ANAEROBIC Blood Culture adequate volume   Culture   Final    NO GROWTH 1 DAY Performed at [facility], [address], [place], [place] [address]    Report Status PENDING  Incomplete  Resp panel by RT-PCR (RSV, Flu A&B, Covid) Anterior Nasal Swab     Status: None   Collection Time: 11/27/22  1:49 AM   Specimen: Anterior Nasal Swab  Result Value Ref Range Status   SARS Coronavirus 2 by RT PCR NEGATIVE NEGATIVE Final    Comment: (NOTE) SARS-CoV-2  target nucleic acids are NOT DETECTED.  The SARS-CoV-2 RNA is generally detectable in upper respiratory specimens during the acute phase of infection. The lowest concentration of SARS-CoV-2 viral copies this assay can detect is 138 copies/mL. A negative result does not preclude SARS-Cov-2 infection and should not be used as the sole basis for treatment or other patient management decisions. A negative result may occur with  improper specimen collection/handling, submission of specimen other than nasopharyngeal swab, presence of viral mutation(s) within the areas targeted by this assay, and inadequate number of viral copies(<138 copies/mL). A negative result must be combined with clinical observations, patient history, and epidemiological information. The expected result is Negative.  Fact Sheet for Patients:  EntrepreneurPulse.com.au  Fact Sheet for Healthcare Providers:  IncredibleEmployment.be  This test is no  t yet approved or cleared by the [place] and  has been authorized for detection and/or diagnosis of SARS-CoV-2 by FDA under an Emergency Use Authorization (EUA). This EUA will remain  in effect (meaning this test can be used) for the duration of the COVID-19 declaration under Section 564(b)(1) of the Act, 21 U.S.C.section 360bbb-3(b)(1), unless the authorization is terminated  or revoked sooner.       Influenza A by PCR NEGATIVE NEGATIVE Final   Influenza B by PCR NEGATIVE NEGATIVE Final    Comment: (NOTE) The Xpert Xpress SARS-CoV-2/FLU/RSV plus assay is intended as an aid in the diagnosis of influenza from Nasopharyngeal swab specimens and should not be used as a sole basis for treatment. Nasal washings and aspirates are unacceptable for Xpert Xpress SARS-CoV-2/FLU/RSV testing.  Fact Sheet for Patients: EntrepreneurPulse.com.au  Fact Sheet for Healthcare  Providers: IncredibleEmployment.be  This test is not yet approved or cleared by the [place] FDA and has been authorized for detection and/or diagnosis of SARS-CoV-2 by FDA under an Emergency Use Authorization (EUA). This EUA will remain in effect (meaning this test can be used) for the duration of the COVID-19 declaration under Section 564(b)(1) of the Act, 21 U.S.C. section 360bbb-3(b)(1), unless the authorization is terminated or revoked.     Resp Syncytial Virus by PCR NEGATIVE NEGATIVE Final    Comment: (NOTE) Fact Sheet for Patients: EntrepreneurPulse.com.au  Fact Sheet for Healthcare Providers: IncredibleEmployment.be  This test is not yet approved or cleared by the [place] FDA and has been authorized for detection and/or diagnosis of SARS-CoV-2 by FDA under an Emergency Use Authorization (EUA). This EUA will remain in effect (meaning this test can be used) for the duration of the COVID-19 declaration under Section 564(b)(1) of the Act, 21 U.S.C. section 360bbb-3(b)(1), unless the authorization is terminated or revoked.  Performed at [facility], [address], [place], [place] [address]   Urine Culture (for pregnant, neutropenic or urologic patients or patients with an indwelling urinary catheter)     Status: Abnormal   Collection Time: 11/27/22  4:08 AM   Specimen: Urine, Clean Catch  Result Value Ref Range Status   Specimen Description   Final    URINE, CLEAN CATCH Performed at [facility], [address] [place], [place] [address]    Special Requests   Final    NONE Performed at [facility], [address] [place], [place] [address]    Culture (A)  Final    <10,000 COLONIES/mL INSIGNIFICANT GROWTH Performed at [facility], [place] [address] [place],  [address]    Report Status 11/28/2022 FINAL  Final  Respiratory (~20 pathogens) panel by PCR      Status: None   Collection Time: 11/27/22 10:03 AM   Specimen: Nasopharyngeal Swab; Respiratory  Result Value Ref Range Status   Adenovirus NOT DETECTED NOT DETECTED Final   Coronavirus 229E NOT DETECTED NOT DETECTED Final    Comment: (NOTE) The Coronavirus on the Respiratory Panel, DOES NOT test for the novel  Coronavirus (2019 nCoV)    Coronavirus HKU1 NOT DETECTED NOT DETECTED Final   Coronavirus NL63 NOT DETECTED NOT DETECTED Final   Coronavirus OC43 NOT DETECTED NOT DETECTED Final   Metapneumovirus NOT DETECTED NOT DETECTED Final   Rhinovirus / Enterovirus NOT DETECTED NOT DETECTED Final   Influenza A NOT DETECTED NOT DETECTED Final   Influenza B NOT DETECTED NOT DETECTED Final   Parainfluenza Virus 1 NOT DETECTED NOT DETECTED  Final   Parainfluenza Virus 2 NOT DETECTED NOT DETECTED Final   Parainfluenza Virus 3 NOT DETECTED NOT DETECTED Final   Parainfluenza Virus 4 NOT DETECTED NOT DETECTED Final   Respiratory Syncytial Virus NOT DETECTED NOT DETECTED Final   Bordetella pertussis NOT DETECTED NOT DETECTED Final   Bordetella Parapertussis NOT DETECTED NOT DETECTED Final   Chlamydophila pneumoniae NOT DETECTED NOT DETECTED Final   Mycoplasma pneumoniae NOT DETECTED NOT DETECTED Final    Comment: Performed at [facility], [place] [address] [place], [place] [address]  Expectorated Sputum Assessment w Gram Stain, Rflx to Resp Cult     Status: None   Collection Time: 11/27/22  5:20 PM   Specimen: Sputum  Result Value Ref Range Status   Specimen Description SPUTUM  Final   Special Requests NONE  Final   Sputum evaluation   Final    Sputum specimen not acceptable for testing.  Please recollect.   Mickel Baas [facility] 11/27/22 1814 AMK Performed at [facility], [address] [place], [place] [address]    Report Status 11/27/2022 FINAL  Final    Labs: CBC: Recent Labs  Lab 11/27/22 0147 11/28/22 0522  WBC 11.8* 14.1*  NEUTROABS 10.7*  --   HGB 11.5* 11.8*  HCT  34.2* 34.5*  MCV 91.2 88.7  PLT 126* [id number]*   Basic Metabolic Panel: Recent Labs  Lab 11/27/22 0147 11/28/22 0522  NA 135 139  K 3.3* 3.5  CL 105 109  CO2 21* 21*  GLUCOSE 159* 244*  BUN 24* 23  CREATININE 0.98 0.95  CALCIUM 7.7* 8.0*  MG 1.7  --    Liver Function Tests: Recent Labs  Lab 11/27/22 0147  AST 42*  ALT 43  ALKPHOS 71  BILITOT 2.2*  PROT 5.9*  ALBUMIN 3.5   CBG: No results for input(s): "GLUCAP" in the last 168 hours.  Discharge time spent: greater than 30 minutes.  This record has been created using Systems analyst. Errors have been sought and corrected,but may not always be located. Such creation errors do not reflect on the standard of care.   Signed: Lorella Nimrod, MD [facility] 11/28/2022

## 2022-11-28 NOTE — TOC Initial Note (Signed)
Transition of Care [facility]) - Initial/Assessment Note    Patient Details  Name: Barry [name] MRN: [id number] Date of Birth: 01/01/1946  Transition of [facility]) CM/SW Contact:    Conception Oms, RN Phone Number: 11/28/2022, 10:45 AM  Clinical Narrative:                  Transition of Care [facility]) Screening Note   Patient Details  Name: Barry [name] Date of Birth: [birth year]   Transition of Care [facility]) CM/SW Contact:    Conception Oms, RN Phone Number: 11/28/2022, 10:45 AM    Transition of Care Department [facility]) has reviewed patient and no TOC needs have been identified at this time. We will continue to monitor patient advancement through interdisciplinary progression rounds. If new patient transition needs arise, please place a TOC consult.    Expected Discharge Plan: Home/Self Care Barriers to Discharge: No Barriers Identified   Patient Goals and CMS Choice            Expected Discharge Plan and Services                         DME Arranged: N/A         HH Arranged: NA          Prior Living Arrangements/Services   Lives with:: Spouse   Do you feel safe going back to the place where you live?: Yes          Current home services: DME (walking stick)    Activities of Daily Living Home Assistive Devices/Equipment: None ADL Screening (condition at time of admission) Patient's cognitive ability adequate to safely complete daily activities?: Yes Is the patient deaf or have difficulty hearing?: No Does the patient have difficulty seeing, even when wearing glasses/contacts?: No Does the patient have difficulty concentrating, remembering, or making decisions?: No Patient able to express need for assistance with ADLs?: Yes Does the patient have difficulty dressing or bathing?: No Independently performs ADLs?: Yes (appropriate for developmental age) Does the patient have difficulty walking or climbing stairs?: No Weakness of Legs:  None Weakness of Arms/Hands: None  Permission Sought/Granted   Permission granted to share information with : Yes, Verbal Permission Granted              Emotional Assessment              Admission diagnosis:  Acute respiratory failure with hypoxia ([place]) [J96.01] Acute bronchitis [J20.9] Patient Active Problem List   Diagnosis Date Noted   Acute bronchitis 11/27/2022   CAD (coronary artery disease) 11/27/2022   Chronic diastolic CHF (congestive heart failure) ([place]) 11/27/2022   BPH (benign prostatic hyperplasia) 11/27/2022   UTI (urinary tract infection) 11/27/2022   Hypokalemia 11/27/2022   Thrombocytopenia ([place]) 11/27/2022   Other personal history presenting hazards to health 10/01/2021   Essential hypertension 10/01/2021   Other and unspecified hyperlipidemia 10/01/2021   History of total knee arthroplasty 05/21/2019   Osteoarthritis of right knee 05/15/2019   Paroxysmal atrial fibrillation ([place]) 01/27/2018   Ischemic cardiomyopathy 01/27/2018   History of ST elevation myocardial infarction (STEMI) 01/16/2018   Rosacea 07/04/2017   Screening for prostate cancer 07/04/2017   Arthritis 06/06/2016   Type 2 diabetes mellitus with other specified complication ([place]) 99991111   Osteoarthritis of knee 03/25/2016   Cerumen impaction 04/08/2015   Difficulty hearing 04/08/2015   Gastric reflux 04/08/2015   Family history of malignant neoplasm of prostate 04/08/2015  Hemorrhoid 04/08/2015   Hyperlipidemia associated with type 2 diabetes mellitus ([place]) 04/08/2015   Pedal edema 04/08/2015   Blood in the urine 02/16/2015   Benign prostatic hyperplasia with lower urinary tract symptoms 02/16/2015   Intermittent urinary stream 02/16/2015   Urinary hesitancy 02/16/2015   PCP:  Olin Hauser, DO Pharmacy:   [place], [place] [address] Phone: [phone] Fax: [phone]     Social Determinants  of Health (SDOH) Social History: [place]: No Food Insecurity (11/27/2022)  Housing: Low Risk  (11/27/2022)  Transportation Needs: No Transportation Needs (11/27/2022)  Utilities: Not At Risk (11/27/2022)  Alcohol Screen: Low Risk  (02/18/2022)  Depression (PHQ2-9): Low Risk  (08/23/2022)  Financial Resource Strain: Low Risk  (10/01/2021)  Physical Activity: Insufficiently Active (10/01/2021)  Social Connections: Moderately Integrated (10/01/2021)  Stress: No Stress Concern Present (10/01/2021)  Tobacco Use: Medium Risk (11/27/2022)   SDOH Interventions:     Readmission Risk Interventions     No data to display

## 2022-11-28 NOTE — Evaluation (Signed)
Occupational Therapy Evaluation Patient Details Name: Barry [name] MRN: [id number] DOB: 08/18/1946 Today's Date: 11/28/2022   History of Present Illness Pt is a 77 y.o. male with medical history significant of hypertension, hyperlipidemia, dCHF, A-fib not on anticoagulants, BPH, who presents with increased urinary frequency, shortness breath, and chills.  MD assessment includes: Acute bronchitis, UTI, hypokalemia, and thrombocytopenia.   Clinical Impression   Chart reviewed, pt greeted in room and agreeable to OT evaluation. Pt is alert and oriented x4, good safety awareness. Pt appears to be performing ADL/functional mobility at baseline. No functional deficits noted at this time. No further OT needs identified. Pt is in agreement. Please reconsult if there is a change in functional status.    Recommendations for follow up therapy are one component of a multi-disciplinary discharge planning process, led by the attending physician.  Recommendations may be updated based on patient status, additional functional criteria and insurance authorization.   Follow Up Recommendations  No OT follow up     Assistance Recommended at Discharge None  Patient can return home with the following      Functional Status Assessment  Patient has not had a recent decline in their functional status  Equipment Recommendations  None recommended by OT    Recommendations for Other Services       Precautions / Restrictions Precautions Precautions: None Restrictions Weight Bearing Restrictions: No      Mobility Bed Mobility Overal bed mobility: Independent                  Transfers Overall transfer level: Independent                        Balance Overall balance assessment: No apparent balance deficits (not formally assessed)                                         ADL either performed or assessed with clinical judgement   ADL Overall ADL's :  Independent                                       General ADL Comments: Pt completed bed mobility, LB dressing (socks), functional mobility to the bathroom without an AD, toilet transfer from regular height toilet, and sinkside grooming all INDly.     Vision Patient Visual Report: No change from baseline       Perception     Praxis      Pertinent Vitals/Pain Pain Assessment Pain Assessment: No/denies pain     Hand Dominance     Extremity/Trunk Assessment Upper Extremity Assessment Upper Extremity Assessment: Overall WFL for tasks assessed   Lower Extremity Assessment Lower Extremity Assessment: Overall WFL for tasks assessed       Communication Communication Communication: No difficulties   Cognition Arousal/Alertness: Awake/alert Behavior During Therapy: WFL for tasks assessed/performed Overall Cognitive Status: Within Functional Limits for tasks assessed                                       General Comments       Exercises Other Exercises Other Exercises: OT provided education re: role of OT, OT POC, post acute recs, sitting up for  all meals, EOB/OOB mobility with assistance, home/fall safety.     Shoulder Instructions      Home Living Family/patient expects to be discharged to:: Private residence Living Arrangements: Spouse/significant other;Children (wife & daughter) Available Help at Discharge: Family;Available 24 hours/day Type of Home: House Home Access: Stairs to enter [employer] of Steps: 3 Entrance Stairs-Rails: Right;Left;Can reach both Home Layout: One level     Bathroom Shower/Tub: Occupational psychologist: Handicapped height     Home Equipment: Grab bars - tub/shower;Grab bars - toilet;Rolling Walker (2 wheels);Rollator (4 wheels);Cane - single point   Additional Comments: Pt reports his wife has macular degeneration and he assists her as needed.      Prior Functioning/Environment  Prior Level of Function : Independent/Modified Independent;Driving             Mobility Comments: Ind amb community distances without an AD, one fall in the last 6 months while playing with his dog ADLs Comments: Ind with ADLs/IADLs        OT Problem List:        OT Treatment/Interventions:      OT Goals(Current goals can be found in the care plan section) Acute Rehab OT Goals Patient Stated Goal: return home OT Goal Formulation: All assessment and education complete, DC therapy  OT Frequency:      Co-evaluation              AM-PAC OT "6 Clicks" Daily Activity     Outcome Measure Help from another person eating meals?: None Help from another person taking care of personal grooming?: None Help from another person toileting, which includes using toliet, bedpan, or urinal?: None Help from another person bathing (including washing, rinsing, drying)?: None Help from another person to put on and taking off regular upper body clothing?: None Help from another person to put on and taking off regular lower body clothing?: None 6 Click Score: 24   End of Session Nurse Communication: Mobility status  Activity Tolerance: Patient tolerated treatment well Patient left: in bed;with call bell/phone within reach  OT Visit Diagnosis: Other abnormalities of gait and mobility (R26.89)                [id number] OT Time Calculation (min): 13 min Charges:  OT General Charges $OT Visit: 1 Visit OT Evaluation $OT Eval Low Complexity: 1 Low  [facility] MS, OTR/L ascom [phone]  11/28/22, 12:20 PM

## 2022-11-28 NOTE — Progress Notes (Signed)
Patient discharged to home, AVS reviewed and all questions answered. IV removed, tele-box returned.

## 2022-11-28 NOTE — Evaluation (Signed)
Physical Therapy Evaluation Patient Details Name: Barry [name] MRN: [id number] DOB: 1946/01/18 Today's Date: 11/28/2022  History of Present Illness  Pt is a 77 y.o. male with medical history significant of hypertension, hyperlipidemia, dCHF, A-fib not on anticoagulants, BPH, who presents with increased urinary frequency, shortness breath, and chills.  MD assessment includes: Acute bronchitis, UTI, hypokalemia, and thrombocytopenia.   Clinical Impression  Pt was pleasant and motivated to participate during the session and put forth good effort throughout. Pt found on RA with SpO2 and HR monitored frequently during the session during graded activity.  Pt's SpO2 and HR at rest 95% and 89 bpm, after minor standing activity at EOB 96% and 92 bpm, after amb 96% and 94 bpm, and after ascending/descending stairs 94% and 96 bpm.  Pt reported feeling only minimally weaker than at baseline but rated above activities as "easy" from an exertion standpoint.  Pt reported no adverse symptoms during the session and stated that he did not require additional skilled PT services going forward. Will complete PT orders at this time but will reassess pt pending a change in status upon receipt of new PT orders.         Recommendations for follow up therapy are one component of a multi-disciplinary discharge planning process, led by the attending physician.  Recommendations may be updated based on patient status, additional functional criteria and insurance authorization.  Follow Up Recommendations No PT follow up      Assistance Recommended at Discharge None  Patient can return home with the following  Assist for transportation    Equipment Recommendations None recommended by PT  Recommendations for Other Services       Functional Status Assessment Patient has not had a recent decline in their functional status     Precautions / Restrictions Precautions Precautions: None Restrictions Weight Bearing  Restrictions: No      Mobility  Bed Mobility Overal bed mobility: Independent                  Transfers Overall transfer level: Independent                      Ambulation/Gait Ambulation/Gait assistance: Independent Gait Distance (Feet): 250 Feet Assistive device: None Gait Pattern/deviations: Step-through pattern, Decreased stride length Gait velocity: min decreased     General Gait Details: Pt steady with gait including during sharp turns and with start/stops  Stairs Stairs: Yes Stairs assistance: Modified independent (Device/Increase time) Stair Management: Two rails, Alternating pattern, Forwards Number of Stairs: 4 General stair comments: Pt steady with good control and stability ascending and descending steps  Wheelchair Mobility    Modified Rankin (Stroke Patients Only)       Balance Overall balance assessment: No apparent balance deficits (not formally assessed)                                           Pertinent Vitals/Pain Pain Assessment Pain Assessment: No/denies pain    Home Living Family/patient expects to be discharged to:: Private residence Living Arrangements: Spouse/significant other;Children Available Help at Discharge: Family;Available 24 hours/day Type of Home: House Home Access: Stairs to enter Entrance Stairs-Rails: Right;Left;Can reach both Entrance Stairs-Number of Steps: 3   Home Layout: One level Home Equipment: Grab bars - tub/shower;Grab bars - toilet;Rolling Walker (2 wheels);Rollator (4 wheels);Cane - single point  Prior Function Prior Level of Function : Independent/Modified Independent             Mobility Comments: Ind amb community distances without an AD, one fall in the last 6 months while playing with his dog ADLs Comments: Ind with ADLs     Hand Dominance        Extremity/Trunk Assessment   Upper Extremity Assessment Upper Extremity Assessment: Overall WFL for  tasks assessed    Lower Extremity Assessment Lower Extremity Assessment: Overall WFL for tasks assessed       Communication   Communication: No difficulties  Cognition Arousal/Alertness: Awake/alert Behavior During Therapy: WFL for tasks assessed/performed Overall Cognitive Status: Within Functional Limits for tasks assessed                                          General Comments      Exercises     Assessment/Plan    PT Assessment Patient does not need any further PT services  PT Problem List         PT Treatment Interventions      PT Goals (Current goals can be found in the Care Plan section)  Acute Rehab PT Goals PT Goal Formulation: All assessment and education complete, DC therapy    Frequency       Co-evaluation               AM-PAC PT "6 Clicks" Mobility  Outcome Measure Help needed turning from your back to your side while in a flat bed without using bedrails?: None Help needed moving from lying on your back to sitting on the side of a flat bed without using bedrails?: None Help needed moving to and from a bed to a chair (including a wheelchair)?: None Help needed standing up from a chair using your arms (e.g., wheelchair or bedside chair)?: None Help needed to walk in hospital room?: None Help needed climbing 3-5 steps with a railing? : None 6 Click Score: 24    End of Session Equipment Utilized During Treatment: Gait belt Activity Tolerance: Patient tolerated treatment well Patient left: in chair;with call bell/phone within reach;with family/visitor present Nurse Communication: Mobility status PT Visit Diagnosis: Muscle weakness (generalized) (M62.81)    Time: [id number] PT Time Calculation (min) (ACUTE ONLY): 35 min   Charges:   PT Evaluation $PT Eval Low Complexity: 1 Low PT Treatments $Therapeutic Activity: 8-22 mins      D. Royetta Asal PT, DPT 11/28/22, 10:41 AM

## 2022-11-29 ENCOUNTER — Telehealth: Payer: Self-pay | Admitting: *Deleted

## 2022-11-29 NOTE — Transitions of Care (Post Inpatient/ED Visit) (Signed)
   11/29/2022  Name: Barry [name] MRN: [id number] DOB: 12/30/1945  Today's TOC FU Call Status: Today's TOC FU Call Status:: Successful TOC FU Call Competed TOC FU Call Complete Date: 11/29/22  Transition Care Management Follow-up Telephone Call Date of Discharge: 11/28/22 Discharge Facility: [facility] Type of Discharge: Inpatient Admission Primary Inpatient Discharge Diagnosis:: UTI How have you been since you were released from the hospital?: Better Any questions or concerns?: No  Items Reviewed: Did you receive and understand the discharge instructions provided?: Yes Medications obtained and verified?: Yes (Medications Reviewed) Any new allergies since your discharge?: No Dietary orders reviewed?: No Do you have support at home?: Yes People in Home: spouse Name of Support/Comfort Primary Source: [facility] and Equipment/Supplies: [place] Ordered?: No Any new equipment or medical supplies ordered?: No  Functional Questionnaire: Do you need assistance with bathing/showering or dressing?: No Do you need assistance with meal preparation?: Yes Do you need assistance with eating?: No Do you have difficulty maintaining continence: No Do you need assistance with getting out of bed/getting out of a chair/moving?: No Do you have difficulty managing or taking your medications?: No  Folllow up appointments reviewed: PCP Follow-up appointment confirmed?: Yes Date of PCP follow-up appointment?: 12/05/22 Follow-up Provider: [facility] Follow-up appointment confirmed?: Yes Date of Specialist follow-up appointment?: 12/01/22 Follow-Up Specialty Provider:: Dr Sharolyn Douglas Do you need transportation to your follow-up appointment?: No Do you understand care options if your condition(s) worsen?: Yes-patient verbalized understanding  SDOH Interventions Today    Flowsheet Row Most Recent Value  SDOH Interventions   Food Insecurity  Interventions Intervention Not Indicated  Housing Interventions Intervention Not Indicated  Transportation Interventions Intervention Not Indicated        [place] [facility] [phone]

## 2022-11-30 ENCOUNTER — Encounter: Payer: Self-pay | Admitting: Nurse Practitioner

## 2022-11-30 NOTE — Progress Notes (Signed)
 " Cardiology Office Note:    Date:  12/01/2022   ID:  Barry [name], DOB [birth year], MRN [id number]  PCP:  Edman Marsa PARAS, DO   [facility] Providers Cardiologist:  Evalene Lunger, MD     Referring MD: Edman Marsa *   Chief Complaint  Patient presents with   Follow-up    Hospital follow up visit. Patient states that is doing good today even though he is not over the bronchitis. Meds reviewed with patient.     hospital f/u    Seen for Dr. Gollan     History of Present Illness:    Barry [name] is a 77 y.o. male with a hx of CAD (inferior MI 2019, PCI to proximal/mid RCA), AF (s/p PCI spontaneously converted), HTN, HLD.  He was last evaluated in our office on 11/11/2022 by Bernardino Bring, PA, at that time he had recent blood pressure cuff readings that indicated that he was bradycardic, heart rate had been in the 30s at home, he was asymptomatic with his heart rate this low.  He had recently wore a ZIO monitor which showed PVCs greater than 12% burden.  Repeat Zio patch was ordered to ensure no further changes, however we have not received the results back from his most recent monitor.  An echo was ordered and will be completed March 20.  Lab work was checked which was overall reassuring.  Admitted 11/27/22 - 11/28/22 for acute bronchitis. He presented to the ED with complaints of urinary frequency. Was previously evaluated in the ED on 11/26/22 and started on cefdinir . He returned to the ED with increased urinary frequency, chills, but without fever. He had worsening SOB, SPO2 89%, placed on O2 with subsequent SPO2 95%. He was noted to have a variable HR during his admission, and he was recommended to f/u with cardiology at d/c.  He presents today accompanied by his daughter for hospital follow-up.  He states he has been doing okay since his hospital discharge, occasionally has issues with positional changes and feeling dizzy.  He reports he is not sure  why he needed to follow-up with us  today however was urged to do so at discharge.  His blood pressure is mildly elevated in the office today, he states he checks it at home twice a day and it is typically 120-130 systolic.  He has some concerns on if he should be on aspirin  instead of Plavix , reiterated that he previously had a nosebleed with aspirin  and the decision was made to continue with Plavix .  We discussed that his monitor results are currently still pending and previously scheduled echo will be completed on March 20.  He denies chest pain, palpitations, dyspnea, pnd, orthopnea, n, v, dizziness, syncope, edema, weight gain, or early satiety.    Past Medical History:  Diagnosis Date   Arthritis    BPH (benign prostatic hypertrophy)    Coronary artery disease    Dilation of aorta (HCC) 09/2019   39 mm   Dyspnea    d/t heart problem.  worsens in hot or cold extremes   GERD (gastroesophageal reflux disease)    Hypertension    Myocardial infarction [facility])    April 2019. 1 stent placed   Wears dentures    partial upper    Past Surgical History:  Procedure Laterality Date   CATARACT EXTRACTION W/PHACO Right 04/12/2016   Procedure: CATARACT EXTRACTION PHACO AND INTRAOCULAR LENS PLACEMENT (IOC);  Surgeon: Elsie Carmine, MD;  Location: [facility] ORS;  Service: Ophthalmology;  Laterality: Right;  US ' 01:05AP% 26.6CDE 17.9fluid pack lot # [id number]   CATARACT EXTRACTION W/PHACO Left 03/08/2022   Procedure: CATARACT EXTRACTION PHACO AND INTRAOCULAR LENS PLACEMENT (IOC) LEFT 22.80 01:43.8;  Surgeon: Jaye Fallow, MD;  Location: [facility];  Service: Ophthalmology;  Laterality: Left;   CORONARY/GRAFT ACUTE MI REVASCULARIZATION N/A 01/16/2018   Procedure: Coronary/Graft Acute MI Revascularization;  Surgeon: Mady Bruckner, MD;  Location: [facility] INV[facility];  Service: Cardiovascular;  Laterality: N/A;   EYE SURGERY     KNEE SURGERY Right 09/21/2012   arthroscopy   LEFT HEART CATH AND  CORONARY ANGIOGRAPHY N/A 01/16/2018   Procedure: LEFT HEART CATH AND CORONARY ANGIOGRAPHY;  Surgeon: Mady Bruckner, MD;  Location: [facility] INVASIVE CV LAB;  Service: Cardiovascular;  Laterality: N/A;   TOTAL KNEE ARTHROPLASTY Right 05/15/2019   Procedure: TOTAL KNEE ARTHROPLASTY;  Surgeon: Leora Lynwood SAUNDERS, MD;  Location: [facility] ORS;  Service: Orthopedics;  Laterality: Right;    Current Medications: Current Meds  Medication Sig   atorvastatin  (LIPITOR) 80 MG tablet Take 1 tablet (80 mg total) by mouth daily at 6 PM.   azithromycin  (ZITHROMAX ) 250 MG tablet Take 1 tablet daily for next 3 days.   carvedilol  (COREG ) 12.5 MG tablet Take 1 tablet (12.5 mg total) by mouth 2 (two) times daily with a meal.   clopidogrel  (PLAVIX ) 75 MG tablet Take 1 tablet (75 mg total) by mouth daily.   dextromethorphan-guaiFENesin  (MUCINEX  DM) 30-600 MG 12hr tablet Take 1 tablet by mouth 2 (two) times daily as needed for cough.   Famotidine  (PEPCID  AC PO) Take 1 tablet by mouth as needed.   tamsulosin  (FLOMAX ) 0.4 MG CAPS capsule Take 1 capsule (0.4 mg total) by mouth daily.   valsartan  (DIOVAN ) 40 MG tablet Take 1 tablet (40 mg total) by mouth daily.     Allergies:   Omnicef  [cefdinir ] and Lisinopril   Social History   Socioeconomic History   Marital status: Married    Spouse name: Roselie   Number of children: Not on file   Years of education: 12th grade   Highest education level: High school graduate  Occupational History   Occupation: retired  Tobacco Use   Smoking status: Former    Types: Cigars    Quit date: 05/07/1974    Years since quitting: 48.6   Smokeless tobacco: Former    Types: Chew    Quit date: 10/17/1993  Vaping Use   Vaping Use: Never used  Substance and Sexual Activity   Alcohol use: No   Drug use: No   Sexual activity: Not on file  Other Topics Concern   Not on file  Social History Narrative   Not on file   Social Determinants of Health   Financial Resource Strain: Low Risk   (10/01/2021)   Overall Financial Resource Strain (CARDIA)    Difficulty of Paying Living Expenses: Not hard at all  Food Insecurity: No Food Insecurity (11/29/2022)   Hunger Vital Sign    Worried About Running Out of Food in the Last Year: Never true    Ran Out of Food in the Last Year: Never true  Transportation Needs: No Transportation Needs (11/29/2022)   PRAPARE - Administrator, Civil Service (Medical): No    Lack of Transportation (Non-Medical): No  Physical Activity: Insufficiently Active (10/01/2021)   Exercise Vital Sign    Days of Exercise per Week: 2 days    Minutes of Exercise per Session: 20 min  Stress: No Stress Concern Present (10/01/2021)   [employer] of Occupational Health - Occupational Stress Questionnaire    Feeling of Stress : Not at all  Social Connections: Moderately Integrated (10/01/2021)   Social Connection and Isolation Panel [NHANES]    Frequency of Communication with Friends and Family: Three times a week    Frequency of Social Gatherings with Friends and Family: Once a week    Attends Religious Services: More than 4 times per year    Active Member of [employer] or Organizations: No    Attends Engineer, Structural: Never    Marital Status: Married     Family History: The patient's family history includes Diabetes in his mother; Hyperlipidemia in his paternal aunt; Melanoma in his brother; Prostate cancer in his brother; Stroke in his mother.  ROS:   Please see the history of present illness.    All other systems reviewed and are negative.  EKGs/Labs/Other Studies Reviewed:    The following studies were reviewed today:  10/06/2022 ZIO monitor -  Normal sinus rhythm Patient had a min HR of 55 bpm, max HR of 176 bpm, and avg HR of 75 bpm.    25 Supraventricular Tachycardia runs occurred, the run with the fastest interval lasting 4 beats with a max rate of 176 bpm, the longest lasting 12 beats with an avg rate of 147 bpm.  (Not  patient triggered)   Isolated SVEs were rare (<1.0%), SVE Couplets were rare (<1.0%), and SVE Triplets were rare (<1.0%).  Isolated VEs were frequent (12.3%, 171234), VE Couplets were occasional (1.4%, 10030), and VE  Triplets were rare (<1.0%, 402). Ventricular Bigeminy and Trigeminy were present.    Patient triggered event (1)  associated with normal sinus rhythm.  09/25/2019 echo complete -EF is 55 to 60%, grade 1 DD, trivial MR, trivial AR, borderline dilatation of the aortic root measuring 39 mm.   02/12/2018 carotid ultrasound -mild bilateral stenosis  01/16/2018 left heart cath -  Conclusions: Inferior STEMI with thrombotic occlusion of the proximal RCA. Moderate, non-obstructive coronary artery disease involving the LAD and LCx. The proximal/mid LAD is aneurysmal. Mildly reduced left ventricular contraction with basal and mid inferior akinesis. LVEF 45-50%. Mildly elevated left ventricular filling pressure (LVEDP ~20 mmHg). Successful primary PCI to proximal/mid RCA using a Xience Sierra 3.0 x 38 mm drug-eluting stent with 0% residual stenosis and TIMI-3 flow. Atrial fibrillation noted at start of procedure, which spontaneously converted to sinus rhythm after reestablishment of flow in the right coronary artery.   EKG:  EKG is  ordered today.  The ekg ordered today demonstrates sinus rhythm with occasional PVCs, consistent with prior EKG tracings.  Recent Labs: 11/11/2022: TSH 1.231 11/27/2022: ALT 43; B Natriuretic Peptide 114.3; Magnesium  1.7 11/28/2022: BUN 23; Creatinine, Ser 0.95; Hemoglobin 11.8; Platelets 139; Potassium 3.5; Sodium 139  Recent Lipid Panel    Component Value Date/Time   CHOL 94 08/17/2022 0851   CHOL 154 11/30/2015 0937   TRIG 52 08/17/2022 0851   HDL 38 (L) 08/17/2022 0851   HDL 43 11/30/2015 0937   CHOLHDL 2.5 08/17/2022 0851   VLDL 26 01/16/2018 1318   LDLCALC 43 08/17/2022 0851     Risk Assessment/Calculations:      HYPERTENSION  CONTROL Vitals:   12/01/22 1023 12/01/22 1104  BP: (!) 142/70 (!) 150/70    The patient's blood pressure is elevated above target today.  In order to address the patient's elevated BP: Blood pressure will be monitored at home  to determine if medication changes need to be made.            Physical Exam:    VS:  BP (!) 150/70   Pulse 75   Ht 5' 9 (1.753 m)   Wt 184 lb 3.2 oz (83.6 kg)   SpO2 98%   BMI 27.20 kg/m     Wt Readings from Last 3 Encounters:  12/01/22 184 lb 3.2 oz (83.6 kg)  11/27/22 184 lb 1.6 oz (83.5 kg)  11/26/22 179 lb 5.9 oz (81.4 kg)     GEN:  Well nourished, well developed in no acute distress HEENT: Normal NECK: No JVD; No carotid bruits LYMPHATICS: No lymphadenopathy CARDIAC:  no murmurs, rubs, gallops RESPIRATORY:  Clear to auscultation without rales, wheezing or rhonchi  ABDOMEN: Soft, non-tender, non-distended MUSCULOSKELETAL: Trace edema at sock line; No deformity  SKIN: Warm and dry NEUROLOGIC:  Alert and oriented x 3 PSYCHIATRIC:  Normal affect   ASSESSMENT:    1. PVC (premature ventricular contraction)   2. Coronary artery disease involving native coronary artery of native heart without angina pectoris   3. Essential hypertension   4. Paroxysmal atrial fibrillation (HCC)   5. Hyperlipidemia LDL goal <70   6. Dilatation of aorta (HCC)    PLAN:    In order of problems listed above:  PVCs - noted on EKG today, asymptomatic for the patient, ZIO monitor results pending. CAD - Stable with no anginal symptoms. No indication for ischemic evaluation.  Continue Plavix , carvedilol , Lipitor. Hypertension - blood pressure today is 150/70, he states it is never this high at home, he typically checks it twice a day and systolic readings are 120-130.  Advised him to keep a blood pressure log and to report any persistent readings greater then 140. Paroxysmal atrial fibrillation - 1 episode immediately after his PCI in 2019, converted to SR on his  own shortly after. No further recurrence.  Hyperlipidemia -LDL on 08/17/2022 43, currently well-controlled, continue Lipitor 80 mg daily.   Disposition -has already scheduled echocardiogram on 01/04/2023, follow-up appointment already in place with Bernardino Bring, PA,  on 3/25.           Medication Adjustments/Labs and Tests Ordered: Current medicines are reviewed at length with the patient today.  Concerns regarding medicines are outlined above.  Orders Placed This Encounter  Procedures   EKG 12-Lead   No orders of the defined types were placed in this encounter.   Patient Instructions  Medication Instructions:  No changes at this time.   *If you need a refill on your cardiac medications before your next appointment, please call your pharmacy*   Lab Work: None  If you have labs (blood work) drawn today and your tests are completely normal, you will receive your results only by: MyChart Message (if you have MyChart) OR A paper copy in the mail If you have any lab test that is abnormal or we need to change your treatment, we will call you to review the results.   Testing/Procedures: None   Follow-Up: At [facility], you and your health needs are our priority.  As part of our continuing mission to provide you with exceptional heart care, we have created designated Provider Care Teams.  These Care Teams include your primary Cardiologist (physician) and Advanced Practice Providers (APPs -  Physician Assistants and Nurse Practitioners) who all work together to provide you with the care you need,   Your next appointment:   Keep scheduled appointment  on 01/09/23 at 11:20 am  Provider:   Bernardino Bring, PA-C       Signed, Delon JAYSON Hoover, NP  12/01/2022 11:10 AM    [facility] "

## 2022-12-01 ENCOUNTER — Ambulatory Visit: Payer: PPO | Attending: Nurse Practitioner | Admitting: Cardiology

## 2022-12-01 ENCOUNTER — Encounter: Payer: Self-pay | Admitting: Nurse Practitioner

## 2022-12-01 VITALS — BP 150/70 | HR 75 | Ht 69.0 in | Wt 184.2 lb

## 2022-12-01 DIAGNOSIS — I2510 Atherosclerotic heart disease of native coronary artery without angina pectoris: Secondary | ICD-10-CM

## 2022-12-01 DIAGNOSIS — E785 Hyperlipidemia, unspecified: Secondary | ICD-10-CM

## 2022-12-01 DIAGNOSIS — I480 Paroxysmal atrial fibrillation: Secondary | ICD-10-CM

## 2022-12-01 DIAGNOSIS — I493 Ventricular premature depolarization: Secondary | ICD-10-CM | POA: Diagnosis not present

## 2022-12-01 DIAGNOSIS — I77819 Aortic ectasia, unspecified site: Secondary | ICD-10-CM

## 2022-12-01 DIAGNOSIS — I10 Essential (primary) hypertension: Secondary | ICD-10-CM | POA: Diagnosis not present

## 2022-12-01 NOTE — Patient Instructions (Signed)
Medication Instructions:  No changes at this time.   *If you need a refill on your cardiac medications before your next appointment, please call your pharmacy*   Lab Work: None  If you have labs (blood work) drawn today and your tests are completely normal, you will receive your results only by: [place] (if you have MyChart) OR A paper copy in the mail If you have any lab test that is abnormal or we need to change your treatment, we will call you to review the results.   Testing/Procedures: None   Follow-Up: At [facility], you and your health needs are our priority.  As part of our continuing mission to provide you with exceptional heart care, we have created designated Provider Care Teams.  These Care Teams include your primary Cardiologist (physician) and Advanced Practice Providers (APPs -  Physician Assistants and Nurse Practitioners) who all work together to provide you with the care you need,   Your next appointment:   Keep scheduled appointment on 01/09/23 at 11:20 am  Provider:   Christell Faith, PA-C

## 2022-12-02 LAB — CULTURE, BLOOD (ROUTINE X 2)
Culture: NO GROWTH
Culture: NO GROWTH
Special Requests: ADEQUATE
Special Requests: ADEQUATE

## 2022-12-05 ENCOUNTER — Encounter: Payer: Self-pay | Admitting: Family Medicine

## 2022-12-05 ENCOUNTER — Ambulatory Visit (INDEPENDENT_AMBULATORY_CARE_PROVIDER_SITE_OTHER): Payer: PPO | Admitting: Family Medicine

## 2022-12-05 VITALS — BP 122/76 | HR 71 | Ht 69.0 in | Wt 178.0 lb

## 2022-12-05 DIAGNOSIS — I10 Essential (primary) hypertension: Secondary | ICD-10-CM | POA: Diagnosis not present

## 2022-12-05 DIAGNOSIS — N401 Enlarged prostate with lower urinary tract symptoms: Secondary | ICD-10-CM

## 2022-12-05 DIAGNOSIS — J069 Acute upper respiratory infection, unspecified: Secondary | ICD-10-CM

## 2022-12-05 DIAGNOSIS — J019 Acute sinusitis, unspecified: Secondary | ICD-10-CM

## 2022-12-05 DIAGNOSIS — R350 Frequency of micturition: Secondary | ICD-10-CM | POA: Diagnosis not present

## 2022-12-05 NOTE — Progress Notes (Signed)
Subjective:    Patient ID: Barry [name], male    DOB: 10-28-1945, 77 y.o.   MRN: [id number]  Barry [name] is a 77 y.o. male presenting on 12/05/2022 for Hospitalization Follow-up   HPI  HOSPITAL FOLLOW-UP VISIT  Hospital/Location: [facility] Date of Admission: 11/27/22 Date of Discharge: 11/28/22 Transitions of care telephone call: 11/29/22 [facility] call Johny Shock RN  Reason for Admission: UTI / Dyspnea / Acute Respiratory Failure  - Hospital H&P and Discharge Summary have been reviewed - Patient presents today 7 days after recent hospitalization. Brief summary of recent course, patient had symptoms of UTI he went on 11/26/22 for UTI and treated with [facility] and he developed allergic reaction with shortness of breath nausea vomiting. He went back to the hospital, admitted for that. And symptoms improved off of Omnicef. He was given Azithromycin and Ceftriaxone. Discharged on 3 more days of Azithromycin. Negative for viral respiratory, has had Chest-xray and CTA Chest. No cause of cough or shortness of breath.  Followed by Urology [facility] Dr Bernardo Heater Urine culture 2/11 negative. He had Ceftriaxone injection and Azithromycin  Cough due BP medication.  - Today reports overall has done well after discharge. Symptoms of cough improved. Has upper airway drainage at times. Using Mucinex-D/M  - New medications on discharge: Azithromycin - Changes to current meds on discharge: none  I have reviewed the discharge medication list, and have reconciled the current and discharge medications today.   Current Outpatient Medications:    atorvastatin (LIPITOR) 80 MG tablet, Take 1 tablet (80 mg total) by mouth daily at 6 PM., Disp: 90 tablet, Rfl: 3   carvedilol (COREG) 12.5 MG tablet, Take 1 tablet (12.5 mg total) by mouth 2 (two) times daily with a meal., Disp: 180 tablet, Rfl: 3   clopidogrel (PLAVIX) 75 MG tablet, Take 1 tablet (75 mg total) by mouth daily., Disp: 90 tablet, Rfl: 3    dextromethorphan-guaiFENesin (MUCINEX DM) 30-600 MG 12hr tablet, Take 1 tablet by mouth 2 (two) times daily as needed for cough., Disp: 30 tablet, Rfl: 0   Famotidine (PEPCID AC PO), Take 1 tablet by mouth as needed., Disp: , Rfl:    tamsulosin (FLOMAX) 0.4 MG CAPS capsule, Take 1 capsule (0.4 mg total) by mouth daily., Disp: 90 capsule, Rfl: 3   valsartan (DIOVAN) 40 MG tablet, Take 1 tablet (40 mg total) by mouth daily., Disp: 90 tablet, Rfl: 3   fluticasone (FLONASE) 50 MCG/ACT nasal spray, Place 2 sprays into both nostrils daily. Use for 4-6 weeks then stop and use seasonally or as needed. (Patient not taking: Reported on 12/01/2022), Disp: 16 g, Rfl: 3  ------------------------------------------------------------------------- Social History   Tobacco Use   Smoking status: Former    Types: Cigars    Quit date: 05/07/1974    Years since quitting: 48.6   Smokeless tobacco: Former    Types: Chew    Quit date: 10/17/1993  Vaping Use   Vaping Use: Never used  Substance Use Topics   Alcohol use: No   Drug use: No    Review of Systems Per HPI unless specifically indicated above     Objective:    BP 122/76   Pulse 71   Ht 5' 9"$  (1.753 m)   Wt 178 lb (80.7 kg)   SpO2 99%   BMI 26.29 kg/m   Wt Readings from Last 3 Encounters:  12/05/22 178 lb (80.7 kg)  12/01/22 184 lb 3.2 oz (83.6 kg)  11/27/22 184 lb 1.6 oz (  83.5 kg)    Physical Exam Vitals and nursing note reviewed.  Constitutional:      General: He is not in acute distress.    Appearance: He is well-developed. He is not diaphoretic.     Comments: Well-appearing, comfortable, cooperative  HENT:     Head: Normocephalic and atraumatic.  Eyes:     General:        Right eye: No discharge.        Left eye: No discharge.     Conjunctiva/sclera: Conjunctivae normal.  Neck:     Thyroid: No thyromegaly.  Cardiovascular:     Rate and Rhythm: Normal rate and regular rhythm.     Pulses: Normal pulses.     Heart sounds: Normal  heart sounds. No murmur heard. Pulmonary:     Effort: Pulmonary effort is normal. No respiratory distress.     Breath sounds: Normal breath sounds. No wheezing or rales.  Musculoskeletal:        General: Normal range of motion.     Cervical back: Normal range of motion and neck supple.  Lymphadenopathy:     Cervical: No cervical adenopathy.  Skin:    General: Skin is warm and dry.     Findings: No erythema or rash.  Neurological:     Mental Status: He is alert and oriented to person, place, and time. Mental status is at baseline.  Psychiatric:        Behavior: Behavior normal.     Comments: Well groomed, good eye contact, normal speech and thoughts     CLINICAL DATA:  Pulmonary embolism (PE) suspected, high prob. Dyspnea, chest pain, vomiting   EXAM: CT ANGIOGRAPHY CHEST WITH CONTRAST   TECHNIQUE: Multidetector CT imaging of the chest was performed using the standard protocol during bolus administration of intravenous contrast. Multiplanar CT image reconstructions and MIPs were obtained to evaluate the vascular anatomy.   RADIATION DOSE REDUCTION: This exam was performed according to the departmental dose-optimization program which includes automated exposure control, adjustment of the mA and/or kV according to patient size and/or use of iterative reconstruction technique.   CONTRAST:  69m OMNIPAQUE IOHEXOL 350 MG/ML SOLN   COMPARISON:  None Available.   FINDINGS: Cardiovascular: Moderate multi-vessel coronary artery calcification. Global cardiac size within normal limits. No pericardial effusion. Central pulmonary arteries are of normal caliber. No intraluminal filling defect identified to suggest acute pulmonary embolism. The thoracic aorta is unremarkable.   Mediastinum/Nodes: No enlarged mediastinal, hilar, or axillary lymph nodes. Thyroid gland, trachea, and esophagus demonstrate no significant findings.   Lungs/Pleura: There is central bronchial wall  thickening as well as scattered areas of peripheral airway impaction within the lower lobes bilaterally, most prevalent within the posterior basal segment of the right lower lobe in keeping with changes of airway inflammation. Mild bibasilar dependent atelectasis. No pneumothorax or pleural effusion. No central obstructing lesion.   Upper Abdomen: No acute abnormality.   Musculoskeletal: No acute bone abnormality. No lytic or blastic bone lesion. Osseous structures are age-appropriate.   Review of the MIP images confirms the above findings.   IMPRESSION: 1. No pulmonary embolism. 2. Moderate multi-vessel coronary artery calcification. 3. Findings in keeping with mild airway inflammation.     Electronically Signed   By: AFidela SalisburyM.D.   On: 11/27/2022 04:00  ------------   DG Chest 2 View (Accession [id number] (Order [id number]:[id number] Imaging Date: 11/27/2022 Department: [facility](1A) Released By: RChristianne Dolin RN (auto-released) Authorizing: BNaaman Plummer  MD   Exam Status  Status  Final [99]   PACS Intelerad Image Link   Show images for DG Chest 2 View Study Result  Narrative & Impression  CLINICAL DATA:  Sepsis, dyspnea   EXAM: CHEST - 2 VIEW   COMPARISON:  None Available.   FINDINGS: The heart size and mediastinal contours are within normal limits. Both lungs are clear. The visualized skeletal structures are unremarkable.   IMPRESSION: No active cardiopulmonary disease.     Electronically Signed   By: Fidela Salisbury M.D.   On: 11/27/2022 02:22    Results for orders placed or performed during the hospital encounter of 11/27/22  Culture, blood (Routine x 2)   Specimen: BLOOD RIGHT ARM  Result Value Ref Range   Specimen Description BLOOD RIGHT ARM    Special Requests      BOTTLES DRAWN AEROBIC AND ANAEROBIC Blood Culture adequate volume   Culture      NO GROWTH 5 DAYS Performed at [facility], [address] [place], [place] [address]    Report Status 12/02/2022 FINAL   Culture, blood (Routine x 2)   Specimen: BLOOD LEFT HAND  Result Value Ref Range   Specimen Description BLOOD LEFT HAND    Special Requests      BOTTLES DRAWN AEROBIC AND ANAEROBIC Blood Culture adequate volume   Culture      NO GROWTH 5 DAYS Performed at [facility], [address] [place], [place] [address]    Report Status 12/02/2022 FINAL   Resp panel by RT-PCR (RSV, Flu A&B, Covid) Anterior Nasal Swab   Specimen: Anterior Nasal Swab  Result Value Ref Range   SARS Coronavirus 2 by RT PCR NEGATIVE NEGATIVE   Influenza A by PCR NEGATIVE NEGATIVE   Influenza B by PCR NEGATIVE NEGATIVE   Resp Syncytial Virus by PCR NEGATIVE NEGATIVE  Urine Culture (for pregnant, neutropenic or urologic patients or patients with an indwelling urinary catheter)   Specimen: Urine, Clean Catch  Result Value Ref Range   Specimen Description      URINE, CLEAN CATCH Performed at [facility], [address], [place], [place] [address]    Special Requests      NONE Performed at [facility], [address] [place], [place] [address]    Culture (A)     <10,000 COLONIES/mL INSIGNIFICANT GROWTH Performed at [facility], [place] [address] [place], [place] [address]    Report Status 11/28/2022 FINAL   Expectorated Sputum Assessment w Gram Stain, Rflx to Resp Cult   Specimen: Sputum  Result Value Ref Range   Specimen Description SPUTUM    Special Requests NONE    Sputum evaluation      Sputum specimen not acceptable for testing.  Please recollect.   Mickel Baas [facility] 11/27/22 1814 AMK Performed at [facility], [address] [place], [place] [address]    Report Status 11/27/2022 FINAL   Respiratory (~20 pathogens) panel by PCR   Specimen: Nasopharyngeal Swab; Respiratory  Result Value Ref Range   Adenovirus NOT DETECTED NOT DETECTED   Coronavirus 229E NOT DETECTED NOT  DETECTED   Coronavirus HKU1 NOT DETECTED NOT DETECTED   Coronavirus NL63 NOT DETECTED NOT DETECTED   Coronavirus OC43 NOT DETECTED NOT DETECTED   Metapneumovirus NOT DETECTED NOT DETECTED   Rhinovirus / Enterovirus NOT DETECTED NOT DETECTED   Influenza A NOT DETECTED NOT DETECTED   Influenza B NOT DETECTED NOT DETECTED   Parainfluenza  Virus 1 NOT DETECTED NOT DETECTED   Parainfluenza Virus 2 NOT DETECTED NOT DETECTED   Parainfluenza Virus 3 NOT DETECTED NOT DETECTED   Parainfluenza Virus 4 NOT DETECTED NOT DETECTED   Respiratory Syncytial Virus NOT DETECTED NOT DETECTED   Bordetella pertussis NOT DETECTED NOT DETECTED   Bordetella Parapertussis NOT DETECTED NOT DETECTED   Chlamydophila pneumoniae NOT DETECTED NOT DETECTED   Mycoplasma pneumoniae NOT DETECTED NOT DETECTED  Comprehensive metabolic panel  Result Value Ref Range   Sodium 135 135 - 145 mmol/L   Potassium 3.3 (L) 3.5 - 5.1 mmol/L   Chloride 105 98 - 111 mmol/L   CO2 21 (L) 22 - 32 mmol/L   Glucose, Bld 159 (H) 70 - 99 mg/dL   BUN 24 (H) 8 - 23 mg/dL   Creatinine, Ser 0.98 0.61 - 1.24 mg/dL   Calcium 7.7 (L) 8.9 - 10.3 mg/dL   Total Protein 5.9 (L) 6.5 - 8.1 g/dL   Albumin 3.5 3.5 - 5.0 g/dL   AST 42 (H) 15 - 41 U/L   ALT 43 0 - 44 U/L   Alkaline Phosphatase 71 38 - 126 U/L   Total Bilirubin 2.2 (H) 0.3 - 1.2 mg/dL   GFR, Estimated >60 >60 mL/min   Anion gap 9 5 - 15  Lactic acid, plasma  Result Value Ref Range   Lactic Acid, Venous 1.2 0.5 - 1.9 mmol/L  Lactic acid, plasma  Result Value Ref Range   Lactic Acid, Venous 1.1 0.5 - 1.9 mmol/L  CBC with Differential  Result Value Ref Range   WBC 11.8 (H) 4.0 - 10.5 K/uL   RBC 3.75 (L) 4.22 - 5.81 MIL/uL   Hemoglobin 11.5 (L) 13.0 - 17.0 g/dL   HCT 34.2 (L) 39.0 - 52.0 %   MCV 91.2 80.0 - 100.0 fL   MCH 30.7 26.0 - 34.0 pg   MCHC 33.6 30.0 - 36.0 g/dL   RDW 12.5 11.5 - 15.5 %   Platelets 126 (L) 150 - 400 K/uL   nRBC 0.0 0.0 - 0.2 %   Neutrophils Relative %  91 %   Neutro Abs 10.7 (H) 1.7 - 7.7 K/uL   Lymphocytes Relative 5 %   Lymphs Abs 0.6 (L) 0.7 - 4.0 K/uL   Monocytes Relative 4 %   Monocytes Absolute 0.4 0.1 - 1.0 K/uL   Eosinophils Relative 0 %   Eosinophils Absolute 0.0 0.0 - 0.5 K/uL   Basophils Relative 0 %   Basophils Absolute 0.0 0.0 - 0.1 K/uL   Immature Granulocytes 0 %   Abs Immature Granulocytes 0.05 0.00 - 0.07 K/uL  Protime-INR  Result Value Ref Range   Prothrombin Time 16.4 (H) 11.4 - 15.2 seconds   INR 1.3 (H) 0.8 - 1.2  Urinalysis, Routine w reflex microscopic -Urine, Clean Catch  Result Value Ref Range   Color, Urine YELLOW (A) YELLOW   APPearance HAZY (A) CLEAR   Specific Gravity, Urine 1.025 1.005 - 1.030   pH 5.0 5.0 - 8.0   Glucose, UA NEGATIVE NEGATIVE mg/dL   Hgb urine dipstick MODERATE (A) NEGATIVE   Bilirubin Urine NEGATIVE NEGATIVE   Ketones, ur NEGATIVE NEGATIVE mg/dL   Protein, ur NEGATIVE NEGATIVE mg/dL   Nitrite NEGATIVE NEGATIVE   Leukocytes,Ua SMALL (A) NEGATIVE   RBC / HPF 0-5 0 - 5 RBC/hpf   WBC, UA 21-50 0 - 5 WBC/hpf   Bacteria, UA RARE (A) NONE SEEN   Squamous Epithelial / HPF 0-5 0 -  5 /HPF   Mucus PRESENT    Sperm, UA PRESENT   Brain natriuretic peptide  Result Value Ref Range   B Natriuretic Peptide 114.3 (H) 0.0 - 100.0 pg/mL  Magnesium  Result Value Ref Range   Magnesium 1.7 1.7 - 2.4 mg/dL  Lactate dehydrogenase  Result Value Ref Range   LDH 126 98 - 192 U/L  Technologist smear review  Result Value Ref Range   WBC MORPHOLOGY MORPHOLOGY UNREMARKABLE    RBC MORPHOLOGY MORPHOLOGY UNREMARKABLE    Plt Morphology Normal platelet morphology    Clinical Information Thrombocytopenia   Basic metabolic panel  Result Value Ref Range   Sodium 139 135 - 145 mmol/L   Potassium 3.5 3.5 - 5.1 mmol/L   Chloride 109 98 - 111 mmol/L   CO2 21 (L) 22 - 32 mmol/L   Glucose, Bld 244 (H) 70 - 99 mg/dL   BUN 23 8 - 23 mg/dL   Creatinine, Ser 0.95 0.61 - 1.24 mg/dL   Calcium 8.0 (L) 8.9 -  10.3 mg/dL   GFR, Estimated >60 >60 mL/min   Anion gap 9 5 - 15  CBC  Result Value Ref Range   WBC 14.1 (H) 4.0 - 10.5 K/uL   RBC 3.89 (L) 4.22 - 5.81 MIL/uL   Hemoglobin 11.8 (L) 13.0 - 17.0 g/dL   HCT 34.5 (L) 39.0 - 52.0 %   MCV 88.7 80.0 - 100.0 fL   MCH 30.3 26.0 - 34.0 pg   MCHC 34.2 30.0 - 36.0 g/dL   RDW 12.3 11.5 - 15.5 %   Platelets 139 (L) 150 - 400 K/uL   nRBC 0.0 0.0 - 0.2 %      Assessment & Plan:   Problem List Items Addressed This Visit     BPH (benign prostatic hyperplasia) - Primary   Essential hypertension   Relevant Orders   CBC with Differential/Platelet   BASIC METABOLIC PANEL WITH GFR   Other Visit Diagnoses     URI with cough and congestion       Subacute rhinosinusitis           HFU UTI resolved URI bronchitis resolving. Urine culture was negative Allergic reaction to Omnicef - now resolved. Listed as allergy  Currently finishing Mucinex-D/M - 1 week approx then complete No further antibiotics Already finished [facility] [place] today to follow up CBC BMET Prior abnormal K and WBC Repeat labs.  Request that he ask Dr Skeet Simmer for earlier apt than May 2024, to discuss urinary leakage chronic BPH LUTS symptoms.  No repeat urine today based on resolution.   No orders of the defined types were placed in this encounter.   Follow up plan: Return if symptoms worsen or fail to improve.   Nobie Putnam, [facility] 12/05/2022, 3:13 PM

## 2022-12-05 NOTE — Patient Instructions (Addendum)
Thank you for coming to the office today.  Urinary Tract Infection was treated in hospital with multiple antibiotics Please contact Dr Bernardo Heater since you are still experiencing the leakage symptoms May need other therapy options  Finish out the Mucinex this week, can discontinue by Thurs/Friday  Pick up a Flonase nasal steroid spray from pharmacy.  You should be completed on the Azithromycin, 3 days outside of hospital and 2 days in hospital is total 5 days which is normal course.  Labs today BMET CBC   Please schedule a Follow-up Appointment to: Return if symptoms worsen or fail to improve.  If you have any other questions or concerns, please feel free to call the office or send a message through [place]. You may also schedule an earlier appointment if necessary.  Additionally, you may be receiving a survey about your experience at our office within a few days to 1 week by e-mail or mail. We value your feedback.  Nobie Putnam, DO [place]

## 2022-12-06 LAB — CBC WITH DIFFERENTIAL/PLATELET
Absolute Monocytes: 764 cells/uL (ref 200–950)
Basophils Absolute: 47 cells/uL (ref 0–200)
Basophils Relative: 0.6 %
Eosinophils Absolute: 218 cells/uL (ref 15–500)
Eosinophils Relative: 2.8 %
HCT: 38.6 % (ref 38.5–50.0)
Hemoglobin: 13.4 g/dL (ref 13.2–17.1)
Lymphs Abs: 1755 cells/uL (ref 850–3900)
MCH: 30.9 pg (ref 27.0–33.0)
MCHC: 34.7 g/dL (ref 32.0–36.0)
MCV: 88.9 fL (ref 80.0–100.0)
MPV: 9.7 fL (ref 7.5–12.5)
Monocytes Relative: 9.8 %
Neutro Abs: 5015 cells/uL (ref 1500–7800)
Neutrophils Relative %: 64.3 %
Platelets: 259 10*3/uL (ref 140–400)
RBC: 4.34 10*6/uL (ref 4.20–5.80)
RDW: 11.9 % (ref 11.0–15.0)
Total Lymphocyte: 22.5 %
WBC: 7.8 10*3/uL (ref 3.8–10.8)

## 2022-12-06 LAB — BASIC METABOLIC PANEL WITH GFR
BUN: 16 mg/dL (ref 7–25)
CO2: 27 mmol/L (ref 20–32)
Calcium: 8.8 mg/dL (ref 8.6–10.3)
Chloride: 107 mmol/L (ref 98–110)
Creat: 0.87 mg/dL (ref 0.70–1.28)
Glucose, Bld: 128 mg/dL — ABNORMAL HIGH (ref 65–99)
Potassium: 4.3 mmol/L (ref 3.5–5.3)
Sodium: 143 mmol/L (ref 135–146)
eGFR: 89 mL/min/{1.73_m2} (ref >=60)

## 2022-12-07 DIAGNOSIS — I493 Ventricular premature depolarization: Secondary | ICD-10-CM | POA: Diagnosis not present

## 2022-12-28 ENCOUNTER — Encounter: Payer: Self-pay | Admitting: Family Medicine

## 2022-12-28 ENCOUNTER — Ambulatory Visit (INDEPENDENT_AMBULATORY_CARE_PROVIDER_SITE_OTHER): Payer: PPO | Admitting: Family Medicine

## 2022-12-28 VITALS — BP 132/66 | HR 77 | Ht 69.0 in | Wt 178.8 lb

## 2022-12-28 DIAGNOSIS — U071 COVID-19: Secondary | ICD-10-CM | POA: Diagnosis not present

## 2022-12-28 DIAGNOSIS — J0110 Acute frontal sinusitis, unspecified: Secondary | ICD-10-CM | POA: Diagnosis not present

## 2022-12-28 DIAGNOSIS — R051 Acute cough: Secondary | ICD-10-CM

## 2022-12-28 LAB — POC COVID19 BINAXNOW: SARS Coronavirus 2 Ag: POSITIVE — AB

## 2022-12-28 LAB — POC INFLUENZA A&B (BINAX/QUICKVUE)
Influenza A, POC: NEGATIVE
Influenza B, POC: NEGATIVE

## 2022-12-28 MED ORDER — AZITHROMYCIN 250 MG PO TABS: ORAL_TABLET | ORAL | 0 refills | Status: DC

## 2022-12-28 MED ORDER — MOLNUPIRAVIR EUA 200MG CAPSULE: 4.0000 | ORAL_CAPSULE | Freq: Two times a day (BID) | ORAL | 0 refills | Status: AC

## 2022-12-28 MED ORDER — IPRATROPIUM BROMIDE 0.06 % NA SOLN: 2.0000 | Freq: Four times a day (QID) | NASAL | 0 refills | Status: DC

## 2022-12-28 MED ORDER — PREDNISONE 50 MG PO TABS: 50.0000 mg | ORAL_TABLET | Freq: Every day | ORAL | 0 refills | Status: DC

## 2022-12-28 NOTE — Patient Instructions (Addendum)
Thank you for coming to the office today.  COVID-19 Positive today on the test.  Start Anti viral COVID medication today  You cannot take Paxlovid due to interactions on Plavix and other meds. But this version of COVID med is safe  Start Atrovent nasal spray decongestant 2 sprays in each nostril up to 4 times daily for 7 days  We will cover to avoid any worsening or complication of bronchitis  Start Azithromycin Z pak (antibiotic) 2 tabs day 1, then 1 tab x 4 days, complete entire course even if improved  Start Prednisone daily for 5 days  Try to stay quarantined for 5 day course, and then can wear mask only and break quarantine  Should not affect your plavix  Should be okay to proceed w/ cardiac testing on 3/19, please notify their office.  Lungs are clear today. We are treating aggressively. If not improving within 48-72 hours, can do walk in for X-ray or contact us or go to urgent care for x-ray imaging.   Please schedule a Follow-up Appointment to: Return if symptoms worsen or fail to improve.  If you have any other questions or concerns, please feel free to call the office or send a message through [place]. You may also schedule an earlier appointment if necessary.  Additionally, you may be receiving a survey about your experience at our office within a few days to 1 week by e-mail or mail. We value your feedback.  Nobie Putnam, DO [place]

## 2022-12-28 NOTE — Progress Notes (Signed)
Subjective:    Patient ID: Barry [name], male    DOB: 10-Jan-1946, 77 y.o.   MRN: [id number]  Barry [name] is a 77 y.o. male presenting on 12/28/2022 for Cough, Sore Throat, Nasal Congestion, and Covid Positive  Patient presents for a same day appointment.  HPI  COVID-19 Positive Cough, Sore throat, Congestion  Previous history with hospitalization February with UTI and Respiratory infection bronchitis with respiratory failure acute. This has resolved  He reports symptoms started last week with congestion and cough. Now worsening. He has been less active. Worse with runny nose and ear pressure and congestion Using Flonase Denies shortness of breath or dyspnea  History of congestion present for months still present       12/05/2022    3:12 PM 08/23/2022   11:12 AM 02/18/2022   10:09 AM  Depression screen PHQ 2/9  Decreased Interest 0 0 0  Down, Depressed, Hopeless 0 0 0  PHQ - 2 Score 0 0 0  Altered sleeping 0 1 0  Tired, decreased energy 1 1 0  Change in appetite 0 0 0  Feeling bad or failure about yourself  0 0 0  Trouble concentrating 0 0 0  Moving slowly or fidgety/restless 1 1 0  Suicidal thoughts 0 0 0  PHQ-9 Score 2 3 0  Difficult doing work/chores Not difficult at all Not difficult at all Not difficult at all    Social History   Tobacco Use   Smoking status: Former    Types: Cigars    Quit date: 05/07/1974    Years since quitting: 48.6   Smokeless tobacco: Former    Types: Chew    Quit date: 10/17/1993  Vaping Use   Vaping Use: Never used  Substance Use Topics   Alcohol use: No   Drug use: No    Review of Systems Per HPI unless specifically indicated above     Objective:    BP 132/66   Pulse 77   Ht '5\' 9"'$  (1.753 m)   Wt 178 lb 12.8 oz (81.1 kg)   SpO2 98%   BMI 26.40 kg/m   Wt Readings from Last 3 Encounters:  12/28/22 178 lb 12.8 oz (81.1 kg)  12/05/22 178 lb (80.7 kg)  12/01/22 184 lb 3.2 oz (83.6 kg)    Physical  Exam Vitals and nursing note reviewed.  Constitutional:      General: He is not in acute distress.    Appearance: He is well-developed. He is not diaphoretic.     Comments: Well-appearing, comfortable, cooperative  HENT:     Head: Normocephalic and atraumatic.     Right Ear: Tympanic membrane, ear canal and external ear normal. There is no impacted cerumen.     Left Ear: Tympanic membrane, ear canal and external ear normal. There is no impacted cerumen.  Eyes:     General:        Right eye: No discharge.        Left eye: No discharge.     Conjunctiva/sclera: Conjunctivae normal.  Neck:     Thyroid: No thyromegaly.     Vascular: No carotid bruit.  Cardiovascular:     Rate and Rhythm: Normal rate and regular rhythm.     Pulses: Normal pulses.     Heart sounds: Normal heart sounds. No murmur heard. Pulmonary:     Effort: Pulmonary effort is normal. No respiratory distress.     Breath sounds: Wheezing (mild exp wheeze upper airway only)  present. No rales.  Musculoskeletal:        General: Normal range of motion.     Cervical back: Normal range of motion and neck supple.  Lymphadenopathy:     Cervical: No cervical adenopathy.  Skin:    General: Skin is warm and dry.     Findings: No erythema or rash.  Neurological:     Mental Status: He is alert and oriented to person, place, and time. Mental status is at baseline.  Psychiatric:        Behavior: Behavior normal.     Comments: Well groomed, good eye contact, normal speech and thoughts     Results for orders placed or performed in visit on 12/28/22  POC COVID-19  Result Value Ref Range   SARS Coronavirus 2 Ag Positive (A) Negative  POC Influenza A&B (Binax test)  Result Value Ref Range   Influenza A, POC Negative Negative   Influenza B, POC Negative Negative      Assessment & Plan:   Problem List Items Addressed This Visit   None Visit Diagnoses     COVID-19    -  Primary   Relevant Medications   molnupiravir EUA  (LAGEVRIO) 200 mg CAPS capsule   azithromycin (ZITHROMAX Z-PAK) 250 MG tablet   Acute cough       Relevant Medications   azithromycin (ZITHROMAX Z-PAK) 250 MG tablet   predniSONE (DELTASONE) 50 MG tablet   Other Relevant Orders   POC COVID-19 (Completed)   POC Influenza A&B (Binax test) (Completed)   Acute non-recurrent frontal sinusitis       Relevant Medications   molnupiravir EUA (LAGEVRIO) 200 mg CAPS capsule   ipratropium (ATROVENT) 0.06 % nasal spray   azithromycin (ZITHROMAX Z-PAK) 250 MG tablet   predniSONE (DELTASONE) 50 MG tablet       COVID-19 Positive today on the test.  Start Anti viral COVID medication today  You cannot take Paxlovid due to interactions on Plavix and other meds. But this version of COVID med is safe  Start Atrovent nasal spray decongestant 2 sprays in each nostril up to 4 times daily for 7 days  We will cover to avoid any worsening or complication of bronchitis  Start Azithromycin Z pak (antibiotic) 2 tabs day 1, then 1 tab x 4 days, complete entire course even if improved  Start Prednisone daily for 5 days  Try to stay quarantined for 5 day course, and then can wear mask only and break quarantine  Should not affect your plavix  Should be okay to proceed w/ cardiac testing on 3/19, please notify their office.  Lungs are clear today. We are treating aggressively. If not improving within 48-72 hours, can do walk in for X-ray or contact us or go to urgent care for x-ray imaging.  Meds ordered this encounter  Medications   molnupiravir EUA (LAGEVRIO) 200 mg CAPS capsule    Sig: Take 4 capsules (800 mg total) by mouth 2 (two) times daily for 5 days.    Dispense:  40 capsule    Refill:  0   ipratropium (ATROVENT) 0.06 % nasal spray    Sig: Place 2 sprays into both nostrils 4 (four) times daily. For up to 5-7 days then stop.    Dispense:  15 mL    Refill:  0   azithromycin (ZITHROMAX Z-PAK) 250 MG tablet    Sig: Take 2 tabs ('500mg'$  total) on  Day 1. Take 1 tab ('250mg'$ ) daily for next 4  days.    Dispense:  6 tablet    Refill:  0   predniSONE (DELTASONE) 50 MG tablet    Sig: Take 1 tablet (50 mg total) by mouth daily with breakfast.    Dispense:  5 tablet    Refill:  0     Follow up plan: Return if symptoms worsen or fail to improve.  Nobie Putnam, [facility] 12/28/2022, 2:30 PM

## 2023-01-04 ENCOUNTER — Ambulatory Visit: Payer: PPO | Attending: Physician Assistant

## 2023-01-04 DIAGNOSIS — I2510 Atherosclerotic heart disease of native coronary artery without angina pectoris: Secondary | ICD-10-CM | POA: Diagnosis not present

## 2023-01-04 LAB — ECHOCARDIOGRAM COMPLETE
AR max vel: 3.05 cm2
AV Area VTI: 2.96 cm2
AV Area mean vel: 2.9 cm2
AV Mean grad: 4 mmHg
AV Peak grad: 7.4 mmHg
Ao pk vel: 1.36 m/s
Area-P 1/2: 3.5 cm2
Calc EF: 51.7 %
P 1/2 time: 858 msec
S' Lateral: 3.8 cm
Single Plane A2C EF: 48.8 %
Single Plane A4C EF: 52.9 %

## 2023-01-08 NOTE — Progress Notes (Unsigned)
Cardiology Office Note:    Date:  01/09/2023   ID:  Barry [name], DOB 09-04-1946, MRN [id number]  PCP:  Barry [name], [place] [facility] Cardiologist:  Barry Rogue, MD     Referring MD: Barry [name] *   CC: follow up for PVC's, HTN.   History of Present Illness:    Barry [name] is a 77 y.o. male with a hx of PAF (s/p PCI spontaneously converted), ischemic cardiomyopathy, CAD (inferior MI 2019, PCI to proximal/mid RCA), hypertension, chronic diastolic heart failure, HLD, DM 2, BPH, history of UTI.  Evaluated in our office on 11/11/2022 by Barry Faith, PA, at that time he had recent blood pressure cuff readings that indicated that he was bradycardic, heart rate had been in the 30s at home, he was asymptomatic with his heart rate this low.  He had recently wore a ZIO monitor which showed PVCs greater than 12% burden.  Repeat Zio patch on 12/08/2022 showed predominant sinus rhythm, 26 episodes of SVT, PVC burden was around 6.7%.  Admitted 11/27/22 - 11/28/22 for acute bronchitis. He presented to the ED with complaints of urinary frequency. Was previously evaluated in the ED on 11/26/22 and started on cefdinir. He returned to the ED with increased urinary frequency, chills, but without fever. He had worsening SOB, SPO2 89%, placed on O2 with subsequent SPO2 95%. He was noted to have a variable HR during his admission, and he was recommended to f/u with cardiology at d/c.   He was evaluated on 12/01/2022 s/p recent hospitalization, at this time he was doing well but had occasional episodes of postural dizziness.  He had an echo completed on 01/04/2023 which showed an EF of 50 to 55% low normal function, mild LVH, grade 1 DD mild MR, mild AR.   He presents today for follow up of his HTN, CAD, and HFpEF. He has been doing well from a cardiac perspective, he is very stoic and does not seem to be bothered by much. We discussed his recent monitor  results and recent echo. He denies chest pain, palpitations, dyspnea, pnd, orthopnea, n, v, dizziness, syncope, edema, weight gain, or early satiety.    Past Medical History:  Diagnosis Date   Arthritis    BPH (benign prostatic hypertrophy)    Coronary artery disease    Dilation of aorta ([place]) 09/2019   39 mm   Dyspnea    d/t heart problem.  worsens in hot or cold extremes   GERD (gastroesophageal reflux disease)    Hypertension    Myocardial infarction [facility])    April 2019. 1 stent placed   Wears dentures    partial upper    Past Surgical History:  Procedure Laterality Date   CATARACT EXTRACTION W/PHACO Right 04/12/2016   Procedure: CATARACT EXTRACTION PHACO AND INTRAOCULAR LENS PLACEMENT ([place]);  Surgeon: Birder Robson, MD;  Location: [facility] ORS;  Service: Ophthalmology;  Laterality: Right;  [place]' 01:05AP% 26.6CDE 17.14fluid pack lot # [id number]   CATARACT EXTRACTION W/PHACO Left 03/08/2022   Procedure: CATARACT EXTRACTION PHACO AND INTRAOCULAR LENS PLACEMENT ([facility]) LEFT 22.80 01:43.8;  Surgeon: Birder Robson, MD;  Location: [facility];  Service: Ophthalmology;  Laterality: Left;   CORONARY/GRAFT ACUTE MI REVASCULARIZATION N/A 01/16/2018   Procedure: Coronary/Graft Acute MI Revascularization;  Surgeon: Nelva Bush, MD;  Location: [facility];  Service: Cardiovascular;  Laterality: N/A;   EYE SURGERY     KNEE SURGERY Right 09/21/2012   arthroscopy   LEFT  HEART CATH AND CORONARY ANGIOGRAPHY N/A 01/16/2018   Procedure: LEFT HEART CATH AND CORONARY ANGIOGRAPHY;  Surgeon: Nelva Bush, MD;  Location: [place] [facility];  Service: Cardiovascular;  Laterality: N/A;   TOTAL KNEE ARTHROPLASTY Right 05/15/2019   Procedure: TOTAL KNEE ARTHROPLASTY;  Surgeon: Lovell Sheehan, MD;  Location: [facility] ORS;  Service: Orthopedics;  Laterality: Right;    Current Medications: Current Meds  Medication Sig   atorvastatin (LIPITOR) 80 MG tablet Take 1 tablet (80 mg total) by  mouth daily at 6 PM.   carvedilol (COREG) 12.5 MG tablet Take 1 tablet (12.5 mg total) by mouth 2 (two) times daily with a meal.   clopidogrel (PLAVIX) 75 MG tablet Take 1 tablet (75 mg total) by mouth daily.   Famotidine (PEPCID AC PO) Take 1 tablet by mouth as needed.   fluticasone (FLONASE) 50 MCG/ACT nasal spray Place 2 sprays into both nostrils daily. Use for 4-6 weeks then stop and use seasonally or as needed.   tamsulosin (FLOMAX) 0.4 MG CAPS capsule Take 1 capsule (0.4 mg total) by mouth daily.   valsartan (DIOVAN) 40 MG tablet Take 1 tablet (40 mg total) by mouth daily.     Allergies:   Omnicef [cefdinir] and Lisinopril   Social History   Socioeconomic History   Marital status: Married    Spouse name: Barry [name]   Number of children: Not on file   Years of education: 12th grade   Highest education level: High school graduate  Occupational History   Occupation: retired  Tobacco Use   Smoking status: Former    Types: Cigars    Quit date: 05/07/1974    Years since quitting: 48.7   Smokeless tobacco: Former    Types: Chew    Quit date: 10/17/1993  Vaping Use   Vaping Use: Never used  Substance and Sexual Activity   Alcohol use: No   Drug use: No   Sexual activity: Not on file  Other Topics Concern   Not on file  Social History Narrative   Not on file   Social Determinants of Health   Financial Resource Strain: Low Risk  (10/01/2021)   Overall Financial Resource Strain (CARDIA)    Difficulty of Paying Living Expenses: Not hard at all  Food Insecurity: No Food Insecurity (11/29/2022)   Hunger Vital Sign    Worried About Running Out of Food in the Last Year: Never true    [place] in the Last Year: Never true  Transportation Needs: No Transportation Needs (11/29/2022)   PRAPARE - Hydrologist (Medical): No    Lack of Transportation (Non-Medical): No  Physical Activity: Insufficiently Active (10/01/2021)   Exercise Vital Sign     Days of Exercise per Week: 2 days    Minutes of Exercise per Session: 20 min  Stress: No Stress Concern Present (10/01/2021)   [place]    Feeling of Stress : Not at all  Social Connections: Moderately Integrated (10/01/2021)   Social Connection and Isolation Panel [NHANES]    Frequency of Communication with Friends and Family: Three times a week    Frequency of Social Gatherings with Friends and Family: Once a week    Attends Religious Services: More than 4 times per year    Active Member of [employer] or Organizations: No    Attends Archivist Meetings: Never    Marital Status: Married     Family  History: The patient's family history includes Diabetes in his mother; Hyperlipidemia in his paternal aunt; Melanoma in his brother; Prostate cancer in his brother; Stroke in his mother.  ROS:   Please see the history of present illness.    All other systems reviewed and are negative.  EKGs/Labs/Other Studies Reviewed:    The following studies were reviewed today:    10/06/2022 ZIO monitor -  Normal sinus rhythm Patient had a min HR of 55 bpm, max HR of 176 bpm, and avg HR of 75 bpm.    25 Supraventricular Tachycardia runs occurred, the run with the fastest interval lasting 4 beats with a max rate of 176 bpm, the longest lasting 12 beats with an avg rate of 147 bpm.  (Not patient triggered)   Isolated SVEs were rare (<1.0%), SVE Couplets were rare (<1.0%), and SVE Triplets were rare (<1.0%).  Isolated VEs were frequent (12.3%, 171234), VE Couplets were occasional (1.4%, 10030), and VE  Triplets were rare (<1.0%, 402). Ventricular Bigeminy and Trigeminy were present.    Patient triggered event (1)  associated with normal sinus rhythm.   09/25/2019 echo complete -EF is 55 to 60%, grade 1 DD, trivial MR, trivial AR, borderline dilatation of the aortic root measuring 39 mm.    02/12/2018 carotid ultrasound -mild  bilateral stenosis   01/16/2018 left heart cath -  Conclusions: Inferior STEMI with thrombotic occlusion of the proximal RCA. Moderate, non-obstructive coronary artery disease involving the LAD and LCx. The proximal/mid LAD is aneurysmal. Mildly reduced left ventricular contraction with basal and mid inferior akinesis. LVEF 45-50%. Mildly elevated left ventricular filling pressure (LVEDP ~20 mmHg). Successful primary PCI to proximal/mid RCA using a Xience Sierra 3.0 x 38 mm drug-eluting stent with 0% residual stenosis and TIMI-3 flow. Atrial fibrillation noted at start of procedure, which spontaneously converted to sinus rhythm after reestablishment of flow in the right coronary artery.  EKG:  EKG is  ordered today.  The ekg ordered today demonstrates SR with PVCs, consistent with prior EKG tracing.   Recent Labs: 11/11/2022: TSH 1.231 11/27/2022: ALT 43; B Natriuretic Peptide 114.3; Magnesium 1.7 12/05/2022: BUN 16; Creat 0.87; Hemoglobin 13.4; Platelets 259; Potassium 4.3; Sodium 143  Recent Lipid Panel    Component Value Date/Time   CHOL 94 08/17/2022 0851   CHOL 154 11/30/2015 0937   TRIG 52 08/17/2022 0851   HDL 38 (L) 08/17/2022 0851   HDL 43 11/30/2015 0937   CHOLHDL 2.5 08/17/2022 0851   VLDL 26 01/16/2018 1318   LDLCALC 43 08/17/2022 0851     Risk Assessment/Calculations:                Physical Exam:    VS:  BP 122/62 (BP Location: Left Arm, Patient Position: Sitting, Cuff Size: Normal)   Pulse 75   Ht 5\' 9"  (1.753 m)   Wt 175 lb 12.8 oz (79.7 kg)   SpO2 97%   BMI 25.96 kg/m     Wt Readings from Last 3 Encounters:  01/09/23 175 lb 12.8 oz (79.7 kg)  12/28/22 178 lb 12.8 oz (81.1 kg)  12/05/22 178 lb (80.7 kg)     GEN:  Well nourished, well developed in no acute distress HEENT: Normal NECK: No JVD; No carotid bruits LYMPHATICS: No lymphadenopathy CARDIAC: RRR, no murmurs, rubs, gallops RESPIRATORY:  Clear to auscultation without rales, wheezing or  rhonchi  ABDOMEN: Soft, non-tender, non-distended MUSCULOSKELETAL:  No edema; No deformity  SKIN: Warm and dry NEUROLOGIC:  Alert and oriented  x 3 PSYCHIATRIC:  Normal affect   ASSESSMENT:    1. PVC (premature ventricular contraction)   2. Coronary artery disease involving native coronary artery of native heart without angina pectoris   3. Essential hypertension   4. Paroxysmal atrial fibrillation (HCC)   5. Hyperlipidemia LDL goal <70    PLAN:    In order of problems listed above:  PVCs - Zio results form 10/16/22 showed PVC burden of 12%, repeat Zio 12/08/22 showed decreased PVC burden ~ 6%. We discussed changing his Coreg > metoprolol, however he does not want to change his medication regimen at this time. He is asymptomatic with his PVCs and are not bothered by them. Continue carvedilol.   CAD -s/p LHC in 2019 PCI to proximal/mid RCA, Moderate, non-obstructive coronary artery disease involving the LAD and Lcx. Stable with no anginal symptoms. No indication for ischemic evaluation.  Continue Plavix, carvedilol, atorvastatin.   Hypertension - BP is well controlled today at 122/62. Continue current antihypertensive medication regimen.   PAF -1 episode immediately after his PCI in 2019, converted to sinus rhythm on his own prior. Should he have a recurrence, [place] would need to be considered.   HLD-LDL on 08/17/2022 was 43, well-controlled, continue atorvastatin 80 mg daily.   Disposition - follow up with Dr. Rockey Situ in 6 months.          Medication Adjustments/Labs and Tests Ordered: Current medicines are reviewed at length with the patient today.  Concerns regarding medicines are outlined above.  Orders Placed This Encounter  Procedures   EKG 12-Lead   No orders of the defined types were placed in this encounter.   Patient Instructions  Medication Instructions:  Your physician recommends that you continue on your current medications as directed. Please refer to the Current  Medication list given to you today.  *If you need a refill on your cardiac medications before your next appointment, please call your pharmacy*  Lab Work: No labs ordered  If you have labs (blood work) drawn today and your tests are completely normal, you will receive your results only by: [place] (if you have MyChart) OR A paper copy in the mail If you have any lab test that is abnormal or we need to change your treatment, we will call you to review the results.   Testing/Procedures: No testing ordered  Follow-Up: At [facility], you and your health needs are our priority.  As part of our continuing mission to provide you with exceptional heart care, we have created designated Provider Care Teams.  These Care Teams include your primary Cardiologist (physician) and Advanced Practice Providers (APPs -  Physician Assistants and Nurse Practitioners) who all work together to provide you with the care you need, when you need it.  We recommend signing up for the patient portal called "MyChart".  Sign up information is provided on this After Visit Summary.  MyChart is used to connect with patients for Virtual Visits (Telemedicine).  Patients are able to view lab/test results, encounter notes, upcoming appointments, etc.  Non-urgent messages can be sent to your provider as well.   To learn more about what you can do with MyChart, go to [email or web address].    Your next appointment:   6 month(s)  Provider:   Ida Rogue, MD    Signed, Trudi Ida, NP  01/09/2023 11:49 AM    [place]

## 2023-01-09 ENCOUNTER — Encounter: Payer: Self-pay | Admitting: Cardiology

## 2023-01-09 ENCOUNTER — Ambulatory Visit: Payer: PPO | Attending: Physician Assistant | Admitting: Cardiology

## 2023-01-09 VITALS — BP 122/62 | HR 75 | Ht 69.0 in | Wt 175.8 lb

## 2023-01-09 DIAGNOSIS — E785 Hyperlipidemia, unspecified: Secondary | ICD-10-CM | POA: Diagnosis not present

## 2023-01-09 DIAGNOSIS — I480 Paroxysmal atrial fibrillation: Secondary | ICD-10-CM | POA: Diagnosis not present

## 2023-01-09 DIAGNOSIS — I493 Ventricular premature depolarization: Secondary | ICD-10-CM

## 2023-01-09 DIAGNOSIS — I10 Essential (primary) hypertension: Secondary | ICD-10-CM

## 2023-01-09 DIAGNOSIS — I2510 Atherosclerotic heart disease of native coronary artery without angina pectoris: Secondary | ICD-10-CM

## 2023-01-09 NOTE — Patient Instructions (Signed)
Medication Instructions:  Your physician recommends that you continue on your current medications as directed. Please refer to the Current Medication list given to you today.  *If you need a refill on your cardiac medications before your next appointment, please call your pharmacy*   Lab Work: No labs ordered  If you have labs (blood work) drawn today and your tests are completely normal, you will receive your results only by: MyChart Message (if you have MyChart) OR A paper copy in the mail If you have any lab test that is abnormal or we need to change your treatment, we will call you to review the results.   Testing/Procedures: No testing ordered  Follow-Up: At [facility], you and your health needs are our priority.  As part of our continuing mission to provide you with exceptional heart care, we have created designated Provider Care Teams.  These Care Teams include your primary Cardiologist (physician) and Advanced Practice Providers (APPs -  Physician Assistants and Nurse Practitioners) who all work together to provide you with the care you need, when you need it.  We recommend signing up for the patient portal called "MyChart".  Sign up information is provided on this After Visit Summary.  MyChart is used to connect with patients for Virtual Visits (Telemedicine).  Patients are able to view lab/test results, encounter notes, upcoming appointments, etc.  Non-urgent messages can be sent to your provider as well.   To learn more about what you can do with MyChart, go to [email or web address].    Your next appointment:   6 month(s)  Provider:   Timothy Gollan, MD  

## 2023-01-10 DIAGNOSIS — D2261 Melanocytic nevi of right upper limb, including shoulder: Secondary | ICD-10-CM | POA: Diagnosis not present

## 2023-01-10 DIAGNOSIS — L718 Other rosacea: Secondary | ICD-10-CM | POA: Diagnosis not present

## 2023-01-10 DIAGNOSIS — D2272 Melanocytic nevi of left lower limb, including hip: Secondary | ICD-10-CM | POA: Diagnosis not present

## 2023-01-10 DIAGNOSIS — D225 Melanocytic nevi of trunk: Secondary | ICD-10-CM | POA: Diagnosis not present

## 2023-01-10 DIAGNOSIS — L821 Other seborrheic keratosis: Secondary | ICD-10-CM | POA: Diagnosis not present

## 2023-01-10 DIAGNOSIS — L570 Actinic keratosis: Secondary | ICD-10-CM | POA: Diagnosis not present

## 2023-01-19 DIAGNOSIS — H902 Conductive hearing loss, unspecified: Secondary | ICD-10-CM | POA: Diagnosis not present

## 2023-01-19 DIAGNOSIS — H6123 Impacted cerumen, bilateral: Secondary | ICD-10-CM | POA: Diagnosis not present

## 2023-02-16 ENCOUNTER — Telehealth: Payer: Self-pay | Admitting: Family Medicine

## 2023-02-16 NOTE — Telephone Encounter (Signed)
Contacted Barry [name] to schedule their annual wellness visit. Call back at later date: 02/17/2023   Barry [name]; Care Guide Ambulatory Clinical Support [place] [facility] Direct Dial: [phone]

## 2023-02-21 ENCOUNTER — Encounter: Payer: Self-pay | Admitting: Family Medicine

## 2023-02-21 ENCOUNTER — Other Ambulatory Visit: Payer: Self-pay | Admitting: Family Medicine

## 2023-02-21 ENCOUNTER — Telehealth: Payer: Self-pay | Admitting: Family Medicine

## 2023-02-21 ENCOUNTER — Ambulatory Visit (INDEPENDENT_AMBULATORY_CARE_PROVIDER_SITE_OTHER): Payer: PPO | Admitting: Family Medicine

## 2023-02-21 VITALS — BP 132/72 | HR 96 | Ht 69.0 in | Wt 175.0 lb

## 2023-02-21 DIAGNOSIS — I5032 Chronic diastolic (congestive) heart failure: Secondary | ICD-10-CM

## 2023-02-21 DIAGNOSIS — I480 Paroxysmal atrial fibrillation: Secondary | ICD-10-CM | POA: Diagnosis not present

## 2023-02-21 DIAGNOSIS — N401 Enlarged prostate with lower urinary tract symptoms: Secondary | ICD-10-CM | POA: Diagnosis not present

## 2023-02-21 DIAGNOSIS — I10 Essential (primary) hypertension: Secondary | ICD-10-CM | POA: Diagnosis not present

## 2023-02-21 DIAGNOSIS — I25118 Atherosclerotic heart disease of native coronary artery with other forms of angina pectoris: Secondary | ICD-10-CM

## 2023-02-21 DIAGNOSIS — E785 Hyperlipidemia, unspecified: Secondary | ICD-10-CM | POA: Diagnosis not present

## 2023-02-21 DIAGNOSIS — E1169 Type 2 diabetes mellitus with other specified complication: Secondary | ICD-10-CM

## 2023-02-21 DIAGNOSIS — R350 Frequency of micturition: Secondary | ICD-10-CM | POA: Diagnosis not present

## 2023-02-21 DIAGNOSIS — Z0000 Encounter for general adult medical examination without abnormal findings: Secondary | ICD-10-CM

## 2023-02-21 LAB — POCT GLYCOSYLATED HEMOGLOBIN (HGB A1C): Hemoglobin A1C: 6.5 % — AB (ref 4.0–5.6)

## 2023-02-21 MED ORDER — TAMSULOSIN HCL 0.4 MG PO CAPS: 0.4000 mg | ORAL_CAPSULE | Freq: Every day | ORAL | 1 refills | Status: DC

## 2023-02-21 NOTE — Telephone Encounter (Signed)
Contacted Al [place] to schedule their annual wellness visit. Appointment made for 03/10/2023.  Verlee Rossetti; Care Guide Ambulatory Clinical Support [place] [facility] Direct Dial: [phone]

## 2023-02-21 NOTE — Assessment & Plan Note (Signed)
Controlled cholesterol on statin and lifestyle Known CAD Off Aspirin  Plan: 1. Continue current meds - Atorvastating 80mg  daily 2. Continue Brilinta for primary ASCVD risk reduction 3. Encourage improved lifestyle - low carb/cholesterol, reduce portion size, continue improving regular exercise

## 2023-02-21 NOTE — Patient Instructions (Addendum)
Thank you for coming to the office today.  Recent Labs    08/17/22 0851 02/21/23 1123  HGBA1C 6.7* 6.5*   Urine test today for kidneys protein function.  Keep on all meds currently  Re ordered Tamsulosin for 6 months (90 days plus 1 refill) we can re order again next time.   DUE for FASTING BLOOD WORK (no food or drink after midnight before the lab appointment, only water or coffee without cream/sugar on the morning of)  SCHEDULE "Lab Only" visit in the morning at the clinic for lab draw in 6 MONTHS   - Make sure Lab Only appointment is at about 1 week before your next appointment, so that results will be available  For Lab Results, once available within 2-3 days of blood draw, you can can log in to MyChart online to view your results and a brief explanation. Also, we can discuss results at next follow-up visit.   Please schedule a Follow-up Appointment to: Return in about 6 months (around 08/24/2023) for 6 month fasting lab only then 1 week later Annual Physical PM apt.  If you have any other questions or concerns, please feel free to call the office or send a message through MyChart. You may also schedule an earlier appointment if necessary.  Additionally, you may be receiving a survey about your experience at our office within a few days to 1 week by e-mail or mail. We value your feedback.  Saralyn Pilar, DO [facility], [place]

## 2023-02-21 NOTE — Assessment & Plan Note (Signed)
Stable BPH On tamsulosin 0.4mg  daily renew today for 6 months

## 2023-02-21 NOTE — Assessment & Plan Note (Signed)
Well-controlled DM with A1c 6.5 improved stable at goal No known complications or hypoglycemia. Complications - other including hyperlipidemia and vascular disease CAD, GERD - increases risk of future cardiovascular complications   Plan 1. No DM medication required. Diet controlled 2. Encourage improved lifestyle - low carb, low sugar diet, reduce portion size, continue improving regular exercise 3. Check CBG , bring log to next visit for review 4. Continue ASA, ARB, Statin Urine microalbumin Yearly exam w/ [facility]

## 2023-02-21 NOTE — Progress Notes (Signed)
Subjective:    Patient ID: Barry [name], male    DOB: 1946-07-02, 77 y.o.   MRN: [id number]  Barry [name] is a 77 y.o. male presenting on 02/21/2023 for Medical Management of Chronic Issues   HPI  CHRONIC DM, Type 2: Prior A1c 6.6 to 6.7 Due today for A1c Not checking CBG at home Meds: None - diet controlled Currently on Losartan Lifestyle: - Weight down 5 lbs - Diet (improved DM diet) - Exercise (active Denies hypoglycemia   CHRONIC HTN: Reports no new concerns. Home BP readings normal controlled, similar to before Current Meds - Carvedilol 12.5mg  BID, Losartan 25mg  daily   Reports good compliance, took meds today. Tolerating well, w/o complaints.     BPH with LUTS / Prostate Cancer Screening Last PSA 08/2022 Has followed Dr Lonna Cobb BUA Failed Finasteride side effect - He is doing well on Flomax 0.4mg  daily but tried x 2 dose in past without change Admits some worse dribbling and LUTS - Prior treatments including Rapaflo, Silodosin Denies hematuria, dysuria, abdominal pain or flank pain   CAD, history of STEMI / PAF / HLD Followed by Cardiology, Dr Mariah Milling Last visit 10/2021 taken off Aspirin 81 On Plavix, Carvedilol, Atorvastatin Denies any chest pain, dyspnea, anginal symptoms, palpitations   S/p R knee TKR History of Right Knee Pain, Arthritis S/p surgery 04/2019     Health Maintenance: PSA 0.75 (07/2022) - previous 1.19. from prior 1.05 last 6 months. Prior to that 0.6. His brother has prostate cancer dx age 32.   Considering Shingrix / TDap     02/21/2023   11:07 AM 12/05/2022    3:12 PM 08/23/2022   11:12 AM  Depression screen PHQ 2/9  Decreased Interest 1 0 0  Down, Depressed, Hopeless 0 0 0  PHQ - 2 Score 1 0 0  Altered sleeping  0 1  Tired, decreased energy  1 1  Change in appetite  0 0  Feeling bad or failure about yourself   0 0  Trouble concentrating  0 0  Moving slowly or fidgety/restless  1 1  Suicidal thoughts  0 0  PHQ-9  Score  2 3  Difficult doing work/chores  Not difficult at all Not difficult at all    Social History   Tobacco Use   Smoking status: Former    Types: Cigars    Quit date: 05/07/1974    Years since quitting: 48.8   Smokeless tobacco: Former    Types: Chew    Quit date: 10/17/1993  Vaping Use   Vaping Use: Never used  Substance Use Topics   Alcohol use: No   Drug use: No    Review of Systems Per HPI unless specifically indicated above     Objective:    BP 132/72   Pulse 96   Ht 5\' 9"  (1.753 m)   Wt 175 lb (79.4 kg)   SpO2 99%   BMI 25.84 kg/m   Wt Readings from Last 3 Encounters:  02/21/23 175 lb (79.4 kg)  01/09/23 175 lb 12.8 oz (79.7 kg)  12/28/22 178 lb 12.8 oz (81.1 kg)    Physical Exam Vitals and nursing note reviewed.  Constitutional:      General: He is not in acute distress.    Appearance: Normal appearance. He is well-developed. He is not diaphoretic.     Comments: Well-appearing, comfortable, cooperative  HENT:     Head: Normocephalic and atraumatic.  Eyes:     General:  Right eye: No discharge.        Left eye: No discharge.     Conjunctiva/sclera: Conjunctivae normal.  Neck:     Thyroid: No thyromegaly.  Cardiovascular:     Rate and Rhythm: Normal rate and regular rhythm.     Pulses: Normal pulses.     Heart sounds: Normal heart sounds. No murmur heard. Pulmonary:     Effort: Pulmonary effort is normal. No respiratory distress.     Breath sounds: Normal breath sounds. No wheezing or rales.  Musculoskeletal:        General: Normal range of motion.     Cervical back: Normal range of motion and neck supple.     Right lower leg: No edema.     Left lower leg: No edema.  Lymphadenopathy:     Cervical: No cervical adenopathy.  Skin:    General: Skin is warm and dry.     Findings: No erythema or rash.  Neurological:     Mental Status: He is alert and oriented to person, place, and time. Mental status is at baseline.  Psychiatric:         Mood and Affect: Mood normal.        Behavior: Behavior normal.        Thought Content: Thought content normal.     Comments: Well groomed, good eye contact, normal speech and thoughts     Recent Labs    08/17/22 0851 02/21/23 1123  HGBA1C 6.7* 6.5*     Results for orders placed or performed in visit on 02/21/23  POCT HgB A1C  Result Value Ref Range   Hemoglobin A1C 6.5 (A) 4.0 - 5.6 %      Assessment & Plan:   Problem List Items Addressed This Visit     Atherosclerosis of native coronary artery of native heart with stable angina pectoris (HCC)   BPH (benign prostatic hyperplasia)    Stable BPH On tamsulosin 0.4mg  daily renew today for 6 months      Relevant Medications   tamsulosin (FLOMAX) 0.4 MG CAPS capsule   Chronic diastolic CHF (congestive heart failure) (HCC)    Followed by Cardiology Euvolemic currently Not on diuretic      Essential hypertension    Controlled HTN No known complications  Plan: 1. Continue Carvedilol 12.5 TWICE A DAY - Continue on Valsartan 40mg  daily - Encouraged low sodium diet, regular exercise - Monitor BP at home      Hyperlipidemia associated with type 2 diabetes mellitus (HCC)    Controlled cholesterol on statin and lifestyle Known CAD Off Aspirin  Plan: 1. Continue current meds - Atorvastating 80mg  daily 2. Continue Brilinta for primary ASCVD risk reduction 3. Encourage improved lifestyle - low carb/cholesterol, reduce portion size, continue improving regular exercise      Paroxysmal atrial fibrillation (HCC)    Stable Followed by Cardiology On Plavix On rate control      Type 2 diabetes mellitus with other specified complication (HCC) - Primary    Well-controlled DM with A1c 6.5 improved stable at goal No known complications or hypoglycemia. Complications - other including hyperlipidemia and vascular disease CAD, GERD - increases risk of future cardiovascular complications   Plan 1. No DM medication required.  Diet controlled 2. Encourage improved lifestyle - low carb, low sugar diet, reduce portion size, continue improving regular exercise 3. Check CBG , bring log to next visit for review 4. Continue ASA, ARB, Statin Urine microalbumin Yearly exam w/ [facility]  Relevant Orders   POCT HgB A1C (Completed)   Urine microalbumin-creatinine with uACR    Meds ordered this encounter  Medications   tamsulosin (FLOMAX) 0.4 MG CAPS capsule    Sig: Take 1 capsule (0.4 mg total) by mouth daily.    Dispense:  90 capsule    Refill:  1     Follow up plan: Return in about 6 months (around 08/24/2023) for 6 month fasting lab only then 1 week later Annual Physical PM apt.  Future labs ordered for 08/24/23   Saralyn Pilar, DO [facility] 02/21/2023, 11:21 AM

## 2023-02-21 NOTE — Assessment & Plan Note (Signed)
Followed by Cardiology Euvolemic currently Not on diuretic

## 2023-02-21 NOTE — Assessment & Plan Note (Signed)
Stable Followed by Cardiology On Plavix On rate control

## 2023-02-21 NOTE — Assessment & Plan Note (Signed)
Controlled HTN No known complications  Plan: 1. Continue Carvedilol 12.5 TWICE A DAY - Continue on Valsartan 40mg  daily - Encouraged low sodium diet, regular exercise - Monitor BP at home

## 2023-02-22 LAB — MICROALBUMIN / CREATININE URINE RATIO
Creatinine, Urine: 107 mg/dL (ref 20–320)
Microalb Creat Ratio: 2 mg/g creat (ref <30)
Microalb, Ur: 0.2 mg/dL

## 2023-03-03 ENCOUNTER — Ambulatory Visit: Payer: PPO | Admitting: Urology

## 2023-03-03 ENCOUNTER — Encounter: Payer: Self-pay | Admitting: Urology

## 2023-03-03 VITALS — BP 145/63 | HR 73 | Ht 69.0 in | Wt 176.0 lb

## 2023-03-03 DIAGNOSIS — N401 Enlarged prostate with lower urinary tract symptoms: Secondary | ICD-10-CM

## 2023-03-03 LAB — BLADDER SCAN AMB NON-IMAGING: Scan Result: 11

## 2023-03-03 MED ORDER — FINASTERIDE 5 MG PO TABS: 5.0000 mg | ORAL_TABLET | Freq: Every day | ORAL | 3 refills | Status: DC

## 2023-03-03 NOTE — Progress Notes (Signed)
I, Maysun L Gibbs,acting as a scribe for Riki Altes, MD.,have documented all relevant documentation on the behalf of Riki Altes, MD,as directed by  Riki Altes, MD while in the presence of Riki Altes, MD.  03/03/2023 11:05 AM   Al [place] Sep 18, 1946 [id number]  Referring provider: Smitty Cords, DO [address] [place],  [place] [address]  Chief Complaint  Patient presents with   Benign Prostatic Hypertrophy   Urologic history: 1. BPH with LUTS Tamsulosin/finasteride  HPI: Barry [name] is a 77 y.o. male here for annual follow-up for BPH.   At last year's visit Finasteride was added to Tamsulosin. He had improvement in his lower urinary tract symptoms, however, he stopped the medication for several months because he thought it was causing oily skin. He restarted approximately 3 months ago and does feel his symptoms improve while taking the medication. No dysuria or gross hematuria.   No flank, abdominal, or pelvic pain.   PMH: Past Medical History:  Diagnosis Date   Arthritis    BPH (benign prostatic hypertrophy)    Coronary artery disease    Dilation of aorta (HCC) 09/2019   39 mm   Dyspnea    d/t heart problem.  worsens in hot or cold extremes   GERD (gastroesophageal reflux disease)    Hypertension    Myocardial infarction [facility])    April 2019. 1 stent placed   Wears dentures    partial upper    Surgical History: Past Surgical History:  Procedure Laterality Date   CATARACT EXTRACTION W/PHACO Right 04/12/2016   Procedure: CATARACT EXTRACTION PHACO AND INTRAOCULAR LENS PLACEMENT (IOC);  Surgeon: Galen Manila, MD;  Location: [facility] ORS;  Service: Ophthalmology;  Laterality: Right;  [place]' 01:05AP% 26.6CDE 17.20fluid pack lot # [id number]   CATARACT EXTRACTION W/PHACO Left 03/08/2022   Procedure: CATARACT EXTRACTION PHACO AND INTRAOCULAR LENS PLACEMENT (IOC) LEFT 22.80 01:43.8;  Surgeon: Galen Manila, MD;  Location: [facility];  Service: Ophthalmology;  Laterality: Left;   CORONARY/GRAFT ACUTE MI REVASCULARIZATION N/A 01/16/2018   Procedure: Coronary/Graft Acute MI Revascularization;  Surgeon: Yvonne Kendall, MD;  Location: [facility] INVASIVE CV LAB;  Service: Cardiovascular;  Laterality: N/A;   EYE SURGERY     KNEE SURGERY Right 09/21/2012   arthroscopy   LEFT HEART CATH AND CORONARY ANGIOGRAPHY N/A 01/16/2018   Procedure: LEFT HEART CATH AND CORONARY ANGIOGRAPHY;  Surgeon: Yvonne Kendall, MD;  Location: [facility] INVASIVE CV LAB;  Service: Cardiovascular;  Laterality: N/A;   TOTAL KNEE ARTHROPLASTY Right 05/15/2019   Procedure: TOTAL KNEE ARTHROPLASTY;  Surgeon: Lyndle Herrlich, MD;  Location: [facility] ORS;  Service: Orthopedics;  Laterality: Right;    Home Medications:  Allergies as of 03/03/2023       Reactions   Omnicef [cefdinir] Shortness Of Breath, Nausea And Vomiting   Patient states that he had chills    Lisinopril Cough        Medication List        Accurate as of Mar 03, 2023 11:05 AM. If you have any questions, ask your nurse or doctor.          atorvastatin 80 MG tablet Commonly known as: LIPITOR Take 1 tablet (80 mg total) by mouth daily at 6 PM.   carvedilol 12.5 MG tablet Commonly known as: COREG Take 1 tablet (12.5 mg total) by mouth 2 (two) times daily with a meal.   clopidogrel 75 MG tablet Commonly known as: PLAVIX Take 1 tablet (  75 mg total) by mouth daily.   finasteride 5 MG tablet Commonly known as: PROSCAR Take 1 tablet (5 mg total) by mouth daily. Started by: Riki Altes, MD   fluticasone 50 MCG/ACT nasal spray Commonly known as: FLONASE Place 2 sprays into both nostrils daily. Use for 4-6 weeks then stop and use seasonally or as needed.   PEPCID AC PO Take 1 tablet by mouth as needed.   tamsulosin 0.4 MG Caps capsule Commonly known as: FLOMAX Take 1 capsule (0.4 mg total) by mouth daily.   valsartan 40 MG tablet Commonly known as: Diovan Take 1  tablet (40 mg total) by mouth daily.        Allergies:  Allergies  Allergen Reactions   Omnicef [Cefdinir] Shortness Of Breath and Nausea And Vomiting    Patient states that he had chills    Lisinopril Cough    Family History: Family History  Problem Relation Age of Onset   Diabetes Mother    Stroke Mother    Prostate cancer Brother    Melanoma Brother    Hyperlipidemia Paternal Aunt     Social History:  reports that he quit smoking about 48 years ago. His smoking use included cigars. He quit smokeless tobacco use about 29 years ago.  His smokeless tobacco use included chew. He reports that he does not drink alcohol and does not use drugs.   Physical Exam: BP (!) 145/63   Pulse 73   Ht 5\' 9"  (1.753 m)   Wt 176 lb (79.8 kg)   BMI 25.99 kg/m   Constitutional:  Alert and oriented, No acute distress. HEENT: [place] AT Respiratory: Normal respiratory effort, no increased work of breathing. Psychiatric: Normal mood and affect.   Assessment & Plan:    1. BPH with lower urinary tract symptoms Stable on Finasteride/Tamsulosin- refill sent to pharmacy.  PVR today was 11 mL Continue annual follow-up with [facility]  I have reviewed the above documentation for accuracy and completeness, and I agree with the above.   Riki Altes, MD  [facility] [address], [address] [place], [place] [address] [phone]

## 2023-03-10 ENCOUNTER — Ambulatory Visit (INDEPENDENT_AMBULATORY_CARE_PROVIDER_SITE_OTHER): Payer: PPO

## 2023-03-10 VITALS — Ht 69.0 in | Wt 176.0 lb

## 2023-03-10 DIAGNOSIS — Z0000 Encounter for general adult medical examination without abnormal findings: Secondary | ICD-10-CM

## 2023-03-10 NOTE — Patient Instructions (Signed)
[name] , Thank you for taking time to come for your Medicare Wellness Visit. I appreciate your ongoing commitment to your health goals. Please review the following plan we discussed and let me know if I can assist you in the future.   These are the goals we discussed:  Goals      DIET - EAT MORE FRUITS AND VEGETABLES     DIET - INCREASE WATER INTAKE     Exercise 150 min/wk Moderate Activity     Pt purchased stationary bicycle after cardiac rehab. Continue using 3-5 times per week for 30 minutes.     Increase physical activity     Current activity is walking occasionally- limited due to knee.  Suggestions for walking- walking laps in a pool and suggested [employer].      Patient Stated     09/08/2020, no goals        This is a list of the screening recommended for you and due dates:  Health Maintenance  Topic Date Due   DTaP/Tdap/Td vaccine (4 - Td or Tdap) 05/17/2021   COVID-19 Vaccine (3 - 2023-24 season) 06/17/2022   Eye exam for diabetics  02/12/2023   Zoster (Shingles) Vaccine (1 of 2) 05/24/2023*   Flu Shot  05/18/2023   Complete foot exam   08/24/2023   Hemoglobin A1C  08/24/2023   Yearly kidney function blood test for diabetes  12/06/2023   Yearly kidney health urinalysis for diabetes  02/21/2024   Medicare Annual Wellness Visit  03/09/2024   Pneumonia Vaccine  Completed   Hepatitis C Screening  Completed   HPV Vaccine  Aged Out   Colon Cancer Screening  Discontinued  *Topic was postponed. The date shown is not the original due date.    Advanced directives: no  Conditions/risks identified: none  Next appointment: Follow up in one year for your annual wellness visit. 03/15/24 @ 11:15 am by phone  Preventive Care 65 Years and Older, Male  Preventive care refers to lifestyle choices and visits with your health care provider that can promote health and wellness. What does preventive care include? A yearly physical exam. This is also called an annual  well check. Dental exams once or twice a year. Routine eye exams. Ask your health care provider how often you should have your eyes checked. Personal lifestyle choices, including: Daily care of your teeth and gums. Regular physical activity. Eating a healthy diet. Avoiding tobacco and drug use. Limiting alcohol use. Practicing safe sex. Taking low doses of aspirin every day. Taking vitamin and mineral supplements as recommended by your health care provider. What happens during an annual well check? The services and screenings done by your health care provider during your annual well check will depend on your age, overall health, lifestyle risk factors, and family history of disease. Counseling  Your health care provider may ask you questions about your: Alcohol use. Tobacco use. Drug use. Emotional well-being. Home and relationship well-being. Sexual activity. Eating habits. History of falls. Memory and ability to understand (cognition). Work and work Astronomer. Screening  You may have the following tests or measurements: Height, weight, and BMI. Blood pressure. Lipid and cholesterol levels. These may be checked every 5 years, or more frequently if you are over 73 years old. Skin check. Lung cancer screening. You may have this screening every year starting at age 3 if you have a 30-pack-year history of smoking and currently smoke or have quit within the past 15 years. Fecal  occult blood test (FOBT) of the stool. You may have this test every year starting at age 94. Flexible sigmoidoscopy or colonoscopy. You may have a sigmoidoscopy every 5 years or a colonoscopy every 10 years starting at age 69. Prostate cancer screening. Recommendations will vary depending on your family history and other risks. Hepatitis C blood test. Hepatitis B blood test. Sexually transmitted disease (STD) testing. Diabetes screening. This is done by checking your blood sugar (glucose) after you have  not eaten for a while (fasting). You may have this done every 1-3 years. Abdominal aortic aneurysm (AAA) screening. You may need this if you are a current or former smoker. Osteoporosis. You may be screened starting at age 42 if you are at high risk. Talk with your health care provider about your test results, treatment options, and if necessary, the need for more tests. Vaccines  Your health care provider may recommend certain vaccines, such as: Influenza vaccine. This is recommended every year. Tetanus, diphtheria, and acellular pertussis (Tdap, Td) vaccine. You may need a Td booster every 10 years. Zoster vaccine. You may need this after age 18. Pneumococcal 13-valent conjugate (PCV13) vaccine. One dose is recommended after age 43. Pneumococcal polysaccharide (PPSV23) vaccine. One dose is recommended after age 71. Talk to your health care provider about which screenings and vaccines you need and how often you need them. This information is not intended to replace advice given to you by your health care provider. Make sure you discuss any questions you have with your health care provider. Document Released: 10/30/2015 Document Revised: 06/22/2016 Document Reviewed: 08/04/2015 Elsevier Interactive Patient Education  2017 [employer].  Fall Prevention in the Home Falls can cause injuries. They can happen to people of all ages. There are many things you can do to make your home safe and to help prevent falls. What can I do on the outside of my home? Regularly fix the edges of walkways and driveways and fix any cracks. Remove anything that might make you trip as you walk through a door, such as a raised step or threshold. Trim any bushes or trees on the path to your home. Use bright outdoor lighting. Clear any walking paths of anything that might make someone trip, such as rocks or tools. Regularly check to see if handrails are loose or broken. Make sure that both sides of any steps have  handrails. Any raised decks and porches should have guardrails on the edges. Have any leaves, snow, or ice cleared regularly. Use sand or salt on walking paths during winter. Clean up any spills in your garage right away. This includes oil or grease spills. What can I do in the bathroom? Use night lights. Install grab bars by the toilet and in the tub and shower. Do not use towel bars as grab bars. Use non-skid mats or decals in the tub or shower. If you need to sit down in the shower, use a plastic, non-slip stool. Keep the floor dry. Clean up any water that spills on the floor as soon as it happens. Remove soap buildup in the tub or shower regularly. Attach bath mats securely with double-sided non-slip rug tape. Do not have throw rugs and other things on the floor that can make you trip. What can I do in the bedroom? Use night lights. Make sure that you have a light by your bed that is easy to reach. Do not use any sheets or blankets that are too big for your bed. They should  not hang down onto the floor. Have a firm chair that has side arms. You can use this for support while you get dressed. Do not have throw rugs and other things on the floor that can make you trip. What can I do in the kitchen? Clean up any spills right away. Avoid walking on wet floors. Keep items that you use a lot in easy-to-reach places. If you need to reach something above you, use a strong step stool that has a grab bar. Keep electrical cords out of the way. Do not use floor polish or wax that makes floors slippery. If you must use wax, use non-skid floor wax. Do not have throw rugs and other things on the floor that can make you trip. What can I do with my stairs? Do not leave any items on the stairs. Make sure that there are handrails on both sides of the stairs and use them. Fix handrails that are broken or loose. Make sure that handrails are as long as the stairways. Check any carpeting to make sure  that it is firmly attached to the stairs. Fix any carpet that is loose or worn. Avoid having throw rugs at the top or bottom of the stairs. If you do have throw rugs, attach them to the floor with carpet tape. Make sure that you have a light switch at the top of the stairs and the bottom of the stairs. If you do not have them, ask someone to add them for you. What else can I do to help prevent falls? Wear shoes that: Do not have high heels. Have rubber bottoms. Are comfortable and fit you well. Are closed at the toe. Do not wear sandals. If you use a stepladder: Make sure that it is fully opened. Do not climb a closed stepladder. Make sure that both sides of the stepladder are locked into place. Ask someone to hold it for you, if possible. Clearly mark and make sure that you can see: Any grab bars or handrails. First and last steps. Where the edge of each step is. Use tools that help you move around (mobility aids) if they are needed. These include: Canes. Walkers. Scooters. Crutches. Turn on the lights when you go into a dark area. Replace any light bulbs as soon as they burn out. Set up your furniture so you have a clear path. Avoid moving your furniture around. If any of your floors are uneven, fix them. If there are any pets around you, be aware of where they are. Review your medicines with your doctor. Some medicines can make you feel dizzy. This can increase your chance of falling. Ask your doctor what other things that you can do to help prevent falls. This information is not intended to replace advice given to you by your health care provider. Make sure you discuss any questions you have with your health care provider. Document Released: 07/30/2009 Document Revised: 03/10/2016 Document Reviewed: 11/07/2014 Elsevier Interactive Patient Education  2017 [employer].

## 2023-03-10 NOTE — Progress Notes (Signed)
I connected with  Barry [name] on 03/10/23 by a audio enabled telemedicine application and verified that I am speaking with the correct person using two identifiers.  Patient Location: Home  Provider Location: Office/Clinic  I discussed the limitations of evaluation and management by telemedicine. The patient expressed understanding and agreed to proceed.  Subjective:   Barry [name] is a 77 y.o. male who presents for Medicare Annual/Subsequent preventive examination.  Review of Systems     Cardiac Risk Factors include: advanced age (>59men, >48 women);male gender;dyslipidemia;hypertension     Objective:    There were no vitals filed for this visit. There is no height or weight on file to calculate BMI.     03/10/2023    1:12 PM 11/27/2022   10:00 PM 03/08/2022   11:04 AM 10/11/2021    5:09 AM 10/01/2021    9:01 AM 09/08/2020   10:21 AM 05/15/2019    2:57 PM  Advanced Directives  Does Patient Have a Medical Advance Directive? No No Yes No No No No  Type of Surveyor, minerals;Living will      Does patient want to make changes to medical advance directive?   No - Patient declined      Copy of Healthcare Power of Attorney in Chart?   No - copy requested      Would patient like information on creating a medical advance directive? No - Patient declined No - Patient declined  No - Patient declined No - Patient declined  No - Patient declined    Current Medications (verified) Outpatient Encounter Medications as of 03/10/2023  Medication Sig   atorvastatin (LIPITOR) 80 MG tablet Take 1 tablet (80 mg total) by mouth daily at 6 PM.   carvedilol (COREG) 12.5 MG tablet Take 1 tablet (12.5 mg total) by mouth 2 (two) times daily with a meal.   clopidogrel (PLAVIX) 75 MG tablet Take 1 tablet (75 mg total) by mouth daily.   Famotidine (PEPCID AC PO) Take 1 tablet by mouth as needed.   finasteride (PROSCAR) 5 MG tablet Take 1 tablet (5 mg total)  by mouth daily.   fluticasone (FLONASE) 50 MCG/ACT nasal spray Place 2 sprays into both nostrils daily. Use for 4-6 weeks then stop and use seasonally or as needed.   tamsulosin (FLOMAX) 0.4 MG CAPS capsule Take 1 capsule (0.4 mg total) by mouth daily.   valsartan (DIOVAN) 40 MG tablet Take 1 tablet (40 mg total) by mouth daily.   No facility-administered encounter medications on file as of 03/10/2023.    Allergies (verified) Omnicef [cefdinir] and Lisinopril   History: Past Medical History:  Diagnosis Date   Arthritis    BPH (benign prostatic hypertrophy)    Coronary artery disease    Dilation of aorta (HCC) 09/2019   39 mm   Dyspnea    d/t heart problem.  worsens in hot or cold extremes   GERD (gastroesophageal reflux disease)    Hypertension    Myocardial infarction [facility])    April 2019. 1 stent placed   Wears dentures    partial upper   Past Surgical History:  Procedure Laterality Date   CATARACT EXTRACTION W/PHACO Right 04/12/2016   Procedure: CATARACT EXTRACTION PHACO AND INTRAOCULAR LENS PLACEMENT (IOC);  Surgeon: Galen Manila, MD;  Location: [facility] ORS;  Service: Ophthalmology;  Laterality: Right;  [place]' 01:05AP% 26.6CDE 17.67fluid pack lot # [id number]   CATARACT EXTRACTION W/PHACO Left 03/08/2022   Procedure: CATARACT EXTRACTION  PHACO AND INTRAOCULAR LENS PLACEMENT (IOC) LEFT 22.80 01:43.8;  Surgeon: Galen Manila, MD;  Location: [facility];  Service: Ophthalmology;  Laterality: Left;   CORONARY/GRAFT ACUTE MI REVASCULARIZATION N/A 01/16/2018   Procedure: Coronary/Graft Acute MI Revascularization;  Surgeon: Yvonne Kendall, MD;  Location: [facility] INVASIVE CV LAB;  Service: Cardiovascular;  Laterality: N/A;   EYE SURGERY     KNEE SURGERY Right 09/21/2012   arthroscopy   LEFT HEART CATH AND CORONARY ANGIOGRAPHY N/A 01/16/2018   Procedure: LEFT HEART CATH AND CORONARY ANGIOGRAPHY;  Surgeon: Yvonne Kendall, MD;  Location: [facility] INVASIVE CV LAB;  Service:  Cardiovascular;  Laterality: N/A;   TOTAL KNEE ARTHROPLASTY Right 05/15/2019   Procedure: TOTAL KNEE ARTHROPLASTY;  Surgeon: Lyndle Herrlich, MD;  Location: [facility] ORS;  Service: Orthopedics;  Laterality: Right;   Family History  Problem Relation Age of Onset   Diabetes Mother    Stroke Mother    Prostate cancer Brother    Melanoma Brother    Hyperlipidemia Paternal Aunt    Social History   Socioeconomic History   Marital status: Married    Spouse name: Claris Gower   Number of children: Not on file   Years of education: 12th grade   Highest education level: High school graduate  Occupational History   Occupation: retired  Tobacco Use   Smoking status: Former    Types: Cigars    Quit date: 05/07/1974    Years since quitting: 48.8   Smokeless tobacco: Former    Types: Chew    Quit date: 10/17/1993  Vaping Use   Vaping Use: Never used  Substance and Sexual Activity   Alcohol use: No   Drug use: No   Sexual activity: Not on file  Other Topics Concern   Not on file  Social History Narrative   Not on file   Social Determinants of Health   Financial Resource Strain: Low Risk  (03/10/2023)   Overall Financial Resource Strain (CARDIA)    Difficulty of Paying Living Expenses: Not hard at all  Food Insecurity: No Food Insecurity (03/10/2023)   Hunger Vital Sign    Worried About Running Out of Food in the Last Year: Never true    Ran Out of Food in the Last Year: Never true  Transportation Needs: No Transportation Needs (03/10/2023)   PRAPARE - Administrator, Civil Service (Medical): No    Lack of Transportation (Non-Medical): No  Physical Activity: Insufficiently Active (03/10/2023)   Exercise Vital Sign    Days of Exercise per Week: 3 days    Minutes of Exercise per Session: 30 min  Stress: No Stress Concern Present (03/10/2023)   [employer] of Occupational Health - Occupational Stress Questionnaire    Feeling of Stress : Not at all  Social Connections:  Moderately Integrated (03/10/2023)   Social Connection and Isolation Panel [NHANES]    Frequency of Communication with Friends and Family: More than three times a week    Frequency of Social Gatherings with Friends and Family: More than three times a week    Attends Religious Services: More than 4 times per year    Active Member of [employer] or Organizations: No    Attends Banker Meetings: Never    Marital Status: Married    Tobacco Counseling Counseling given: Not Answered   Clinical Intake:  Pre-visit preparation completed: Yes  Pain : No/denies pain     Nutritional Risks: None Diabetes: No- per patient  How often do you  need to have someone help you when you read instructions, pamphlets, or other written materials from your doctor or pharmacy?: 1 - Never  Diabetic?pt stated no he was not  Interpreter Needed?: No  Information entered by :: Kennedy Bucker, LPN   Activities of Daily Living    03/10/2023    1:14 PM 02/21/2023   11:08 AM  In your present state of health, do you have any difficulty performing the following activities:  Hearing? 0 0  Vision? 0 0  Difficulty concentrating or making decisions? 0 1  Walking or climbing stairs? 0 0  Dressing or bathing? 0 0  Doing errands, shopping? 0 0  Preparing Food and eating ? N   Using the Toilet? N   In the past six months, have you accidently leaked urine? N   Do you have problems with loss of bowel control? N   Managing your Medications? N   Managing your Finances? N   Housekeeping or managing your Housekeeping? N     Patient Care Team: Smitty Cords, DO as PCP - General (Family Medicine) Mariah Milling Tollie Pizza, MD as PCP - Cardiology (Cardiology) Antonieta Iba, MD as Consulting Physician (Cardiology) [facility], [facility] (Optometry)  Indicate any recent Medical Services you may have received from other than [facility] providers in the past year (date may be approximate).     Assessment:    This is a routine wellness examination for Barry [name].  Hearing/Vision screen Hearing Screening - Comments:: No aids Vision Screening - Comments:: Wear glasses- Dr.Porfilio  Dietary issues and exercise activities discussed: Current Exercise Habits: Home exercise routine, Time (Minutes): 30, Frequency (Times/Week): 3, Weekly Exercise (Minutes/Week): 90, Intensity: Mild, Exercise limited by: None identified   Goals Addressed             This Visit's Progress    DIET - INCREASE WATER INTAKE         Depression Screen    03/10/2023    1:11 PM 02/21/2023   11:07 AM 12/05/2022    3:12 PM 08/23/2022   11:12 AM 02/18/2022   10:09 AM 10/01/2021    9:00 AM 08/20/2021   10:45 AM  PHQ 2/9 Scores  PHQ - 2 Score 0 1 0 0 0 0 0  PHQ- 9 Score 0  2 3 0 1 1    Fall Risk    03/10/2023    1:13 PM 02/21/2023   11:08 AM 12/05/2022    3:12 PM 08/23/2022   11:12 AM 02/18/2022   10:08 AM  Fall Risk   Falls in the past year? 1 1 0 0 0  Number falls in past yr: 0 0 0 0 0  Injury with Fall? 0 0 0 0 0  Risk for fall due to : History of fall(s)  No Fall Risks No Fall Risks No Fall Risks  Follow up Falls prevention discussed;Falls evaluation completed  Falls evaluation completed Falls evaluation completed Falls evaluation completed    FALL RISK PREVENTION PERTAINING TO THE HOME:  Any stairs in or around the home? Yes  If so, are there any without handrails? No  Home free of loose throw rugs in walkways, pet beds, electrical cords, etc? Yes  Adequate lighting in your home to reduce risk of falls? Yes   ASSISTIVE DEVICES UTILIZED TO PREVENT FALLS:  Life alert? No  Use of a cane, walker or w/c? No  Grab bars in the bathroom? Yes  Shower chair or bench in shower? Yes  Elevated toilet seat or a handicapped toilet? Yes    Cognitive Function:    05/19/2015   10:06 AM  MMSE - Mini Mental State Exam  Orientation to time 5  Orientation to Place 5  Registration 3  Attention/ Calculation 5  Recall 3   Language- name 2 objects 2  Language- repeat 1  Language- follow 3 step command 3  Language- read & follow direction 1  Write a sentence 1  Copy design 1  Total score 30        03/10/2023    1:18 PM 09/08/2020   10:24 AM 08/14/2018    9:57 AM 06/27/2017    9:39 AM  6CIT Screen  What Year? 0 points 0 points 0 points 0 points  What month? 0 points 0 points 0 points 0 points  What time? 0 points 0 points 0 points 0 points  Count back from 20 0 points 0 points 0 points 0 points  Months in reverse 0 points 0 points 0 points 0 points  Repeat phrase 0 points 0 points 2 points 2 points  Total Score 0 points 0 points 2 points 2 points    Immunizations Immunization History  Administered Date(s) Administered   Influenza, High Dose Seasonal PF 07/28/2015, 08/14/2018, 07/21/2021, 07/22/2022   Influenza,inj,quad, With Preservative 07/31/2017, 07/18/2019   Influenza-Unspecified 06/17/2010, 07/18/2011, 06/17/2014, 07/20/2017, 07/17/2020   PFIZER(Purple Top)SARS-COV-2 Vaccination 11/29/2019, 12/20/2019   Pneumococcal Conjugate-13 05/07/2014   Pneumococcal Polysaccharide-23 10/12/2011   Pneumococcal-Unspecified 09/17/2011   Td 05/18/2011   Tdap 02/15/2008, 05/17/2008    TDAP status: Due, Education has been provided regarding the importance of this vaccine. Advised may receive this vaccine at local pharmacy or Health Dept. Aware to provide a copy of the vaccination record if obtained from local pharmacy or Health Dept. Verbalized acceptance and understanding.  Flu Vaccine status: Up to date  Pneumococcal vaccine status: Up to date  Covid-19 vaccine status: Completed vaccines  Qualifies for Shingles Vaccine? Yes   Zostavax completed No   Shingrix Completed?: No.    Education has been provided regarding the importance of this vaccine. Patient has been advised to call insurance company to determine out of pocket expense if they have not yet received this vaccine. Advised may also receive  vaccine at local pharmacy or Health Dept. Verbalized acceptance and understanding.  Screening Tests Health Maintenance  Topic Date Due   DTaP/Tdap/Td (4 - Td or Tdap) 05/17/2021   COVID-19 Vaccine (3 - 2023-24 season) 06/17/2022   OPHTHALMOLOGY EXAM  02/12/2023   Zoster Vaccines- Shingrix (1 of 2) 05/24/2023 (Originally 03/27/1996)   INFLUENZA VACCINE  05/18/2023   FOOT EXAM  08/24/2023   HEMOGLOBIN A1C  08/24/2023   Diabetic kidney evaluation - eGFR measurement  12/06/2023   Diabetic kidney evaluation - Urine ACR  02/21/2024   Medicare Annual Wellness (AWV)  03/09/2024   Pneumonia Vaccine 84+ Years old  Completed   Hepatitis C Screening  Completed   HPV VACCINES  Aged Out   Colonoscopy  Discontinued    Health Maintenance  Health Maintenance Due  Topic Date Due   DTaP/Tdap/Td (4 - Td or Tdap) 05/17/2021   COVID-19 Vaccine (3 - 2023-24 season) 06/17/2022   OPHTHALMOLOGY EXAM  02/12/2023    Colorectal cancer screening: No longer required.   Lung Cancer Screening: (Low Dose CT Chest recommended if Age 60-80 years, 30 pack-year currently smoking OR have quit w/in 15years.) does not qualify.    Additional Screening:  Hepatitis C Screening: does  qualify; Completed 07/15/19  Vision Screening: Recommended annual ophthalmology exams for early detection of glaucoma and other disorders of the eye. Is the patient up to date with their annual eye exam?  Yes  Who is the provider or what is the name of the office in which the patient attends annual eye exams? Dr.Porfilio If pt is not established with a provider, would they like to be referred to a provider to establish care? No .   Dental Screening: Recommended annual dental exams for proper oral hygiene  Community Resource Referral / Chronic Care Management: CRR required this visit?  No   CCM required this visit?  No      Plan:     I have personally reviewed and noted the following in the patient's chart:   Medical and  social history Use of alcohol, tobacco or illicit drugs  Current medications and supplements including opioid prescriptions. Patient is not currently taking opioid prescriptions. Functional ability and status Nutritional status Physical activity Advanced directives List of other physicians Hospitalizations, surgeries, and ER visits in previous 12 months Vitals Screenings to include cognitive, depression, and falls Referrals and appointments  In addition, I have reviewed and discussed with patient certain preventive protocols, quality metrics, and best practice recommendations. A written personalized care plan for preventive services as well as general preventive health recommendations were provided to patient.     Hal Hope, LPN   1/61/0960   Nurse Notes: none

## 2023-04-17 ENCOUNTER — Encounter: Payer: Self-pay | Admitting: Cardiovascular Disease

## 2023-04-18 NOTE — Telephone Encounter (Signed)
HR Reviewed on EKG.

## 2023-04-18 NOTE — Telephone Encounter (Signed)
HR reviewed on EKG.

## 2023-05-05 ENCOUNTER — Ambulatory Visit: Payer: PPO | Admitting: Physician Assistant

## 2023-07-10 NOTE — Progress Notes (Unsigned)
Cardiology Office Note  Date:  07/11/2023   ID:  Barry [name], Barry [name] 1946/06/10, MRN [id number]  PCP:  Smitty Cords, DO   Chief Complaint  Patient presents with   6 month follow up     "Doing well." Medications reviewed by the patient verbally.     HPI:  77 year old gentleman with history of  CAD, prior PCI to mid RCA 01/2018 hypertension,  hyperlipidemia,  arthritis,  Anxiety  Chronic right knee pain,  Diabetes type 2, HBA1C 6.4 Ejection fraction 45 to 50% April 2019 Intolerance to several blood pressure medications as detailed below EF up to 55 to 60% in 12/20 Atrial fibrillation noted at start of catheterization April 2019, converted to normal sinus rhythm spontaneously who presents for follow-up of his coronary disease,  STEMI April 2019, proximal to mid RCA April 2019  Last seen by myself in clinic  November 2023 Seen by one of our providers March 2024  In follow-up today reports that he feels relatively well Rides the bike for exercise at home Denies shortness of breath or chest pain on exertion Takes care of wife who has chronic debilities  Checks his blood pressure once a week, BP at home: 120-130 cyst Aulik Denies tachypalpitations concerning for arrhythmia  Tolerating carvedilol, losartan Brilinta was changed to Plavix for cost reasons Off asa secondary to nose bleeds Episode of epistaxis, in the ER 10/11/21 No lower extremity edema  Lab work reviewed Total cholesterol 94 LDL 43 A1c 6.5 Hemoglobin 13  EKG personally reviewed by myself on todays visit EKG Interpretation Date/Time:  Tuesday July 11 2023 15:00:05 EDT Ventricular Rate:  63 PR Interval:  194 QRS Duration:  92 QT Interval:  388 QTC Calculation: 397 R Axis:   -26  Text Interpretation: Normal sinus rhythm Septal infarct , age undetermined When compared with ECG of 27-Nov-2022 01:45, No significant change was found Confirmed by Julien Nordmann [phone]) on 07/11/2023  3:03:49 PM    Episode of epistaxis, in the ER 10/11/21 Afrin and clip, discharged has follow-up with ENT Seem to happen around the very cold weather around Christmas  History of chronic shortness of breath Prior work-up including echocardiogram December 2020, normal ejection fraction EF 55%  Other past medical history reviewed Paroxysmal atrial fibrillation noted prior to PCI, converted spontaneously  Echocardiogram 2018 - Left ventricle: The cavity size was normal. Wall thickness was   increased in a pattern of mild LVH. Systolic function was mildly   reduced. The estimated ejection fraction was in the range of 45%   to 50%. There is hypokinesis of the basal-midinferolateral and   inferior myocardium. Left ventricular diastolic function   parameters were normal for the patient&'s age. - Aortic valve: There was trivial regurgitation. - Right ventricle: The cavity size was normal. Systolic function   was mildly to moderately reduced.  January 16, 2018 chest pain, d/c on 01/18/2018 Cardiac catheterization performed inferior STEMI thrombotic occlusion to the proximal RCA moderate nonobstructive coronary disease of LAD and left circumflex Proximal to mid LAD is aneurysmal Drug-eluting stent placed to proximal to mid RCA 3.0 x 38 mm  Peak troponin 54 on January 17, 2018  Previous side effects to various medications Amlodipine, he developed swelling HCTZ, reported having dry skin, dry mouth Lisinopril, developed a cough Atenolol: did not work Coreg: ok so far  Family history of diabetes, MI Mom with CVA Dad with alzheimers   PMH:   has a past medical history of Arthritis, BPH (benign prostatic hypertrophy),  Coronary artery disease, Dilation of aorta (HCC) (09/2019), Dyspnea, GERD (gastroesophageal reflux disease), Hypertension, Myocardial infarction [facility]), and Wears dentures.  PSH:    Past Surgical History:  Procedure Laterality Date   CATARACT EXTRACTION W/PHACO Right 04/12/2016    Procedure: CATARACT EXTRACTION PHACO AND INTRAOCULAR LENS PLACEMENT (IOC);  Surgeon: Galen Manila, MD;  Location: [facility] ORS;  Service: Ophthalmology;  Laterality: Right;  [place]' 01:05AP% 26.6CDE 17.78fluid pack lot # [id number] H   CATARACT EXTRACTION W/PHACO Left 03/08/2022   Procedure: CATARACT EXTRACTION PHACO AND INTRAOCULAR LENS PLACEMENT (IOC) LEFT 22.80 01:43.8;  Surgeon: Galen Manila, MD;  Location: [facility];  Service: Ophthalmology;  Laterality: Left;   CORONARY/GRAFT ACUTE MI REVASCULARIZATION N/A 01/16/2018   Procedure: Coronary/Graft Acute MI Revascularization;  Surgeon: Yvonne Kendall, MD;  Location: [facility];  Service: Cardiovascular;  Laterality: N/A;   EYE SURGERY     KNEE SURGERY Right 09/21/2012   arthroscopy   LEFT HEART CATH AND CORONARY ANGIOGRAPHY N/A 01/16/2018   Procedure: LEFT HEART CATH AND CORONARY ANGIOGRAPHY;  Surgeon: Yvonne Kendall, MD;  Location: [facility];  Service: Cardiovascular;  Laterality: N/A;   TOTAL KNEE ARTHROPLASTY Right 05/15/2019   Procedure: TOTAL KNEE ARTHROPLASTY;  Surgeon: Lyndle Herrlich, MD;  Location: [facility] ORS;  Service: Orthopedics;  Laterality: Right;   Current Outpatient Medications on File Prior to Visit  Medication Sig Dispense Refill   Famotidine (PEPCID AC PO) Take 1 tablet by mouth as needed.     finasteride (PROSCAR) 5 MG tablet Take 1 tablet (5 mg total) by mouth daily. 90 tablet 3   fluticasone (FLONASE) 50 MCG/ACT nasal spray Place 2 sprays into both nostrils daily. Use for 4-6 weeks then stop and use seasonally or as needed. 16 g 3   tamsulosin (FLOMAX) 0.4 MG CAPS capsule Take 1 capsule (0.4 mg total) by mouth daily. 90 capsule 1   No current facility-administered medications on file prior to visit.    Allergies:   Omnicef [cefdinir] and Lisinopril   Social History:  The patient  reports that he quit smoking about 49 years ago. His smoking use included cigars. He quit smokeless tobacco use  about 29 years ago.  His smokeless tobacco use included chew. He reports that he does not drink alcohol and does not use drugs.   Family History:   family history includes Diabetes in his mother; Hyperlipidemia in his paternal aunt; Melanoma in his brother; Prostate cancer in his brother; Stroke in his mother.    Review of Systems: Review of Systems  Constitutional: Negative.   HENT: Negative.    Respiratory: Negative.    Cardiovascular: Negative.   Gastrointestinal: Negative.   Musculoskeletal: Negative.   Neurological: Negative.   Psychiatric/Behavioral: Negative.    All other systems reviewed and are negative.   PHYSICAL EXAM: VS:  BP (!) 142/60 (BP Location: Left Arm, Patient Position: Sitting, Cuff Size: Normal)   Pulse 63   Ht 5\' 8"  (1.727 m)   Wt 178 lb (80.7 kg)   SpO2 98%   BMI 27.06 kg/m  , BMI Body mass index is 27.06 kg/m.  Constitutional:  oriented to person, place, and time. No distress.  HENT:  Head: Grossly normal Eyes:  no discharge. No scleral icterus.  Neck: No JVD, no carotid bruits  Cardiovascular: Regular rate and rhythm, no murmurs appreciated Pulmonary/Chest: Clear to auscultation bilaterally, no wheezes or rails Abdominal: Soft.  no distension.  no tenderness.  Musculoskeletal: Normal range of motion Neurological:  normal  muscle tone. Coordination normal. No atrophy Skin: Skin warm and dry Psychiatric: normal affect, pleasant  Recent Labs: 11/11/2022: TSH 1.231 11/27/2022: ALT 43; B Natriuretic Peptide 114.3; Magnesium 1.7 12/05/2022: BUN 16; Creat 0.87; Hemoglobin 13.4; Platelets 259; Potassium 4.3; Sodium 143    Lipid Panel Lab Results  Component Value Date   CHOL 94 08/17/2022   HDL 38 (L) 08/17/2022   LDLCALC 43 08/17/2022   TRIG 52 08/17/2022      Wt Readings from Last 3 Encounters:  07/11/23 178 lb (80.7 kg)  03/10/23 176 lb (79.8 kg)  03/03/23 176 lb (79.8 kg)     ASSESSMENT AND PLAN:  Coronary artery disease with stable  angina Prior STEMI presented April 2019 with STEMI occluded proximal RCA nonobstructive LAD disease, circumflex disease Stents placed to RCA, vessel opened Echocardiogram normal ejection fraction December 2020 Tolerating Plavix, not on aspirin secondary to epistaxis Currently with no symptoms of angina. No further workup at this time. Continue current medication regimen.  Essential (primary) hypertension - Blood pressure is well controlled on today's visit. No changes made to the medications.  Irregular rhythm Prior history atrial fibrillation noted at time of catheterization April 2019 Repeat Zio x2 with no A-fib  Pure hypercholesterolemia -  Cholesterol is at goal on the current lipid regimen. No changes to the medications were made.  Diabetes mellitus due to underlying condition with hyperosmolarity without coma, without long-term current use of insulin (HCC) A1c well controlled  Shortness of breath Shortness of breath improving, uses his bike for regular exercise and conditioning Recommend he call us for worsening shortness of breath or chest tightness  Chronic cough Much improved Possibly related to GERD   Total encounter time more than 30 minutes  Greater than 50% was spent in counseling and coordination of care with the patient    Orders Placed This Encounter  Procedures   EKG 12-Lead     Signed, Dossie Arbour, M.D., Ph.D. 07/11/2023  [facility] [place], [place] [phone]

## 2023-07-11 ENCOUNTER — Ambulatory Visit: Payer: PPO | Attending: Cardiovascular Disease | Admitting: Cardiovascular Disease

## 2023-07-11 ENCOUNTER — Encounter: Payer: Self-pay | Admitting: Cardiovascular Disease

## 2023-07-11 VITALS — BP 142/60 | HR 63 | Ht 68.0 in | Wt 178.0 lb

## 2023-07-11 DIAGNOSIS — I480 Paroxysmal atrial fibrillation: Secondary | ICD-10-CM

## 2023-07-11 DIAGNOSIS — I493 Ventricular premature depolarization: Secondary | ICD-10-CM | POA: Diagnosis not present

## 2023-07-11 DIAGNOSIS — E785 Hyperlipidemia, unspecified: Secondary | ICD-10-CM

## 2023-07-11 DIAGNOSIS — I471 Supraventricular tachycardia, unspecified: Secondary | ICD-10-CM | POA: Diagnosis not present

## 2023-07-11 DIAGNOSIS — I2510 Atherosclerotic heart disease of native coronary artery without angina pectoris: Secondary | ICD-10-CM

## 2023-07-11 DIAGNOSIS — I77819 Aortic ectasia, unspecified site: Secondary | ICD-10-CM | POA: Diagnosis not present

## 2023-07-11 DIAGNOSIS — I10 Essential (primary) hypertension: Secondary | ICD-10-CM

## 2023-07-11 MED ORDER — CLOPIDOGREL BISULFATE 75 MG PO TABS: 75.0000 mg | ORAL_TABLET | Freq: Every day | ORAL | 3 refills | Status: DC

## 2023-07-11 MED ORDER — CARVEDILOL 12.5 MG PO TABS: 12.5000 mg | ORAL_TABLET | Freq: Two times a day (BID) | ORAL | 3 refills | Status: DC

## 2023-07-11 MED ORDER — ATORVASTATIN CALCIUM 80 MG PO TABS: 80.0000 mg | ORAL_TABLET | Freq: Every day | ORAL | 3 refills | Status: DC

## 2023-07-11 MED ORDER — VALSARTAN 40 MG PO TABS: 40.0000 mg | ORAL_TABLET | Freq: Every day | ORAL | 3 refills | Status: DC

## 2023-07-11 NOTE — Patient Instructions (Signed)
Medication Instructions:  No changes  If you need a refill on your cardiac medications before your next appointment, please call your pharmacy.   Lab work: No new labs needed  Testing/Procedures: No new testing needed  Follow-Up: At [facility], you and your health needs are our priority.  As part of our continuing mission to provide you with exceptional heart care, we have created designated Provider Care Teams.  These Care Teams include your primary Cardiologist (physician) and Advanced Practice Providers (APPs -  Physician Assistants and Nurse Practitioners) who all work together to provide you with the care you need, when you need it.  You will need a follow up appointment in 12 months  Providers on your designated Care Team:   Nicolasa Ducking, NP Eula Listen, PA-C Cadence Fransico [place], [place]  COVID-19 Vaccine Information can be found at: [email or web address] For questions related to vaccine distribution or appointments, please email [email or web address] or call [phone].

## 2023-07-20 DIAGNOSIS — H900 Conductive hearing loss, bilateral: Secondary | ICD-10-CM | POA: Diagnosis not present

## 2023-07-20 DIAGNOSIS — H6123 Impacted cerumen, bilateral: Secondary | ICD-10-CM | POA: Diagnosis not present

## 2023-08-24 ENCOUNTER — Other Ambulatory Visit: Payer: PPO

## 2023-08-24 DIAGNOSIS — I10 Essential (primary) hypertension: Secondary | ICD-10-CM

## 2023-08-24 DIAGNOSIS — E785 Hyperlipidemia, unspecified: Secondary | ICD-10-CM | POA: Diagnosis not present

## 2023-08-24 DIAGNOSIS — E1169 Type 2 diabetes mellitus with other specified complication: Secondary | ICD-10-CM | POA: Diagnosis not present

## 2023-08-24 DIAGNOSIS — I480 Paroxysmal atrial fibrillation: Secondary | ICD-10-CM

## 2023-08-24 DIAGNOSIS — I5032 Chronic diastolic (congestive) heart failure: Secondary | ICD-10-CM | POA: Diagnosis not present

## 2023-08-24 DIAGNOSIS — Z0000 Encounter for general adult medical examination without abnormal findings: Secondary | ICD-10-CM | POA: Diagnosis not present

## 2023-08-24 DIAGNOSIS — R350 Frequency of micturition: Secondary | ICD-10-CM | POA: Diagnosis not present

## 2023-08-24 DIAGNOSIS — N401 Enlarged prostate with lower urinary tract symptoms: Secondary | ICD-10-CM

## 2023-08-25 LAB — CBC WITH DIFFERENTIAL/PLATELET
Absolute Lymphocytes: 1595 {cells}/uL (ref 850–3900)
Absolute Monocytes: 583 {cells}/uL (ref 200–950)
Basophils Absolute: 47 {cells}/uL (ref 0–200)
Basophils Relative: 0.7 %
Eosinophils Absolute: 141 {cells}/uL (ref 15–500)
Eosinophils Relative: 2.1 %
HCT: 39.1 % (ref 38.5–50.0)
Hemoglobin: 13.1 g/dL — ABNORMAL LOW (ref 13.2–17.1)
MCH: 30.8 pg (ref 27.0–33.0)
MCHC: 33.5 g/dL (ref 32.0–36.0)
MCV: 92 fL (ref 80.0–100.0)
MPV: 9.8 fL (ref 7.5–12.5)
Monocytes Relative: 8.7 %
Neutro Abs: 4335 {cells}/uL (ref 1500–7800)
Neutrophils Relative %: 64.7 %
Platelets: 196 10*3/uL (ref 140–400)
RBC: 4.25 10*6/uL (ref 4.20–5.80)
RDW: 12.7 % (ref 11.0–15.0)
Total Lymphocyte: 23.8 %
WBC: 6.7 10*3/uL (ref 3.8–10.8)

## 2023-08-25 LAB — COMPLETE METABOLIC PANEL WITH GFR
AG Ratio: 2.1 (calc) (ref 1.0–2.5)
ALT: 17 U/L (ref 9–46)
AST: 19 U/L (ref 10–35)
Albumin: 4.4 g/dL (ref 3.6–5.1)
Alkaline phosphatase (APISO): 100 U/L (ref 35–144)
BUN: 15 mg/dL (ref 7–25)
CO2: 27 mmol/L (ref 20–32)
Calcium: 8.9 mg/dL (ref 8.6–10.3)
Chloride: 106 mmol/L (ref 98–110)
Creat: 0.85 mg/dL (ref 0.70–1.28)
Globulin: 2.1 g/dL (ref 1.9–3.7)
Glucose, Bld: 123 mg/dL — ABNORMAL HIGH (ref 65–99)
Potassium: 4.2 mmol/L (ref 3.5–5.3)
Sodium: 142 mmol/L (ref 135–146)
Total Bilirubin: 0.7 mg/dL (ref 0.2–1.2)
Total Protein: 6.5 g/dL (ref 6.1–8.1)
eGFR: 89 mL/min/{1.73_m2} (ref >=60)

## 2023-08-25 LAB — LIPID PANEL
Cholesterol: 115 mg/dL (ref <200)
HDL: 43 mg/dL (ref >=40)
LDL Cholesterol (Calc): 58 mg/dL
Non-HDL Cholesterol (Calc): 72 mg/dL (ref <130)
Total CHOL/HDL Ratio: 2.7 (calc) (ref <5.0)
Triglycerides: 55 mg/dL (ref <150)

## 2023-08-25 LAB — TSH: TSH: 1.33 m[IU]/L (ref 0.40–4.50)

## 2023-08-25 LAB — HEMOGLOBIN A1C
Hgb A1c MFr Bld: 6.8 %{Hb} — ABNORMAL HIGH (ref <5.7)
Mean Plasma Glucose: 148 mg/dL
eAG (mmol/L): 8.2 mmol/L

## 2023-08-25 LAB — PSA: PSA: 1.11 ng/mL (ref <=4.00)

## 2023-08-30 ENCOUNTER — Encounter: Payer: Self-pay | Admitting: Family Medicine

## 2023-08-30 ENCOUNTER — Ambulatory Visit (INDEPENDENT_AMBULATORY_CARE_PROVIDER_SITE_OTHER): Payer: PPO | Admitting: Family Medicine

## 2023-08-30 VITALS — BP 138/60 | Ht 68.0 in | Wt 180.0 lb

## 2023-08-30 DIAGNOSIS — N401 Enlarged prostate with lower urinary tract symptoms: Secondary | ICD-10-CM | POA: Diagnosis not present

## 2023-08-30 DIAGNOSIS — R350 Frequency of micturition: Secondary | ICD-10-CM

## 2023-08-30 DIAGNOSIS — I25118 Atherosclerotic heart disease of native coronary artery with other forms of angina pectoris: Secondary | ICD-10-CM | POA: Diagnosis not present

## 2023-08-30 DIAGNOSIS — E1169 Type 2 diabetes mellitus with other specified complication: Secondary | ICD-10-CM | POA: Diagnosis not present

## 2023-08-30 DIAGNOSIS — E785 Hyperlipidemia, unspecified: Secondary | ICD-10-CM | POA: Diagnosis not present

## 2023-08-30 DIAGNOSIS — J019 Acute sinusitis, unspecified: Secondary | ICD-10-CM | POA: Diagnosis not present

## 2023-08-30 DIAGNOSIS — Z23 Encounter for immunization: Secondary | ICD-10-CM | POA: Diagnosis not present

## 2023-08-30 DIAGNOSIS — Z0000 Encounter for general adult medical examination without abnormal findings: Secondary | ICD-10-CM

## 2023-08-30 MED ORDER — TAMSULOSIN HCL 0.4 MG PO CAPS: 0.4000 mg | ORAL_CAPSULE | Freq: Every day | ORAL | 3 refills | Status: DC

## 2023-08-30 MED ORDER — FLUTICASONE PROPIONATE 50 MCG/ACT NA SUSP
2.0000 | Freq: Every day | NASAL | 11 refills | Status: AC
Start: 2023-08-30 — End: ?

## 2023-08-30 NOTE — Patient Instructions (Addendum)
Thank you for coming to the office today.  Lab results look good PSA normal  Recent Labs    02/21/23 1123 08/24/23 0815  HGBA1C 6.5* 6.8*   ----------------------------  For knee stiffness  Can try OTC icy hot muscle rub  START anti inflammatory topical - OTC Voltaren (generic Diclofenac) topical 2-4 times a day as needed for pain swelling of affected joint for 1-2 weeks or longer.   Please schedule a Follow-up Appointment to: No follow-ups on file.  If you have any other questions or concerns, please feel free to call the office or send a message through MyChart. You may also schedule an earlier appointment if necessary.  Additionally, you may be receiving a survey about your experience at our office within a few days to 1 week by e-mail or mail. We value your feedback.  Saralyn Pilar, DO [facility], [place]

## 2023-08-30 NOTE — Assessment & Plan Note (Signed)
Controlled cholesterol on statin and lifestyle Known CAD Off Aspirin  Plan: 1. Continue current meds - Atorvastating 80mg  daily 2. Continue Plavix for primary ASCVD risk reduction 3. Encourage improved lifestyle - low carb/cholesterol, reduce portion size, continue improving regular exercise

## 2023-08-30 NOTE — Assessment & Plan Note (Signed)
Well-controlled DM with A1c 6.8 No known complications or hypoglycemia. Complications - other including hyperlipidemia and vascular disease CAD, GERD - increases risk of future cardiovascular complications   Plan 1. No DM medication required. Diet controlled 2. Encourage improved lifestyle - low carb, low sugar diet, reduce portion size, continue improving regular exercise 3. Check CBG , bring log to next visit for review 4. Continue ASA, ARB, Statin Yearly exam w/ [facility] DM Foot

## 2023-08-30 NOTE — Assessment & Plan Note (Signed)
Followed by Urology [facility] On Finasteride, Tamsulosin

## 2023-08-30 NOTE — Progress Notes (Signed)
Subjective:    Patient ID: Barry [name], male    DOB: 1946/01/06, 77 y.o.   MRN: [id number]  Barry [name] is a 77 y.o. male presenting on 08/30/2023 for Annual Exam (Eye exam scheduled for january)   HPI  Discussed the use of AI scribe software for clinical note transcription with the patient, who gave verbal consent to proceed.   CHRONIC DM, Type 2: A1c now is 6.8, Prior A1c 6.6 to 6.7 Not checking CBG at home Meds: None - diet controlled Currently on Losartan Lifestyle: - Weight down 5 lbs - Diet (improved DM diet) - Exercise (active Upcoming DM Eye w [facility] in January 2025 Denies hypoglycemia   CHRONIC HTN: Reports no new concerns. Home BP readings normal controlled, similar to before Current Meds - Carvedilol 12.5mg  TWICE A DAY, Valsartan 40mg  daily Reports good compliance, took meds today. Tolerating well, w/o complaints.     BPH with LUTS / Prostate Cancer Screening Followed Dr Lonna Cobb [facility] Last lab PSA 1.11 - He is doing well on Flomax 0.4mg  daily but tried x 2 dose in past without change Still on Finasteride per Urology Some LUTS - Prior treatments including Rapaflo, Silodosin Denies hematuria, dysuria, abdominal pain or flank pain   CAD, history of STEMI / PAF / HLD Followed by Cardiology, Dr Mariah Milling On Plavix, Carvedilol, Atorvastatin 80mg  Off Aspirin Denies any chest pain, dyspnea, anginal symptoms, palpitations   S/p R knee TKR History of Right Knee Pain, Arthritis S/p surgery 04/2019  Additional complaints - Admits some finger numbness tingling at times and back pain occasional. Usually hand tingling while driving, thought to be carpal tunnel.   The patient has been using a nasal spray as needed, which they find helps with their breathing. They have noticed a need to use the spray more frequently since the onset of the COVID-19 pandemic.   Health Maintenance:  Due high dose flu vaccine + Prevnar-20 today  PSA 1.11  (08/2023) previously 0.75 (07/2022) prior range up to 1.19 (02/2022) - His brother has prostate cancer dx age 63.   Considering Shingrix / TDap     08/30/2023    2:49 PM 03/10/2023    1:11 PM 02/21/2023   11:07 AM  Depression screen PHQ 2/9  Decreased Interest 1 0 1  Down, Depressed, Hopeless 0 0 0  PHQ - 2 Score 1 0 1  Altered sleeping  0   Tired, decreased energy  0   Change in appetite  0   Feeling bad or failure about yourself   0   Trouble concentrating  0   Moving slowly or fidgety/restless  0   Suicidal thoughts  0   PHQ-9 Score  0   Difficult doing work/chores  Not difficult at all        08/30/2023    2:49 PM 02/21/2023   11:07 AM 12/05/2022    3:12 PM 08/23/2022   11:13 AM  GAD 7 : Generalized Anxiety Score  Nervous, Anxious, on Edge 0 0 0 0  Control/stop worrying 1 0 0 0  Worry too much - different things 1 0 0 0  Trouble relaxing 0 1 1 0  Restless 0 0 0 0  Easily annoyed or irritable 1 1 0 1  Afraid - awful might happen 0 0 0 0  Total GAD 7 Score 3 2 1 1   Anxiety Difficulty   Not difficult at all Not difficult at all     Past Medical  History:  Diagnosis Date   Arthritis    BPH (benign prostatic hypertrophy)    Coronary artery disease    Dilation of aorta (HCC) 09/2019   39 mm   Dyspnea    d/t heart problem.  worsens in hot or cold extremes   GERD (gastroesophageal reflux disease)    Hypertension    Myocardial infarction [facility])    April 2019. 1 stent placed   Wears dentures    partial upper   Past Surgical History:  Procedure Laterality Date   CATARACT EXTRACTION W/PHACO Right 04/12/2016   Procedure: CATARACT EXTRACTION PHACO AND INTRAOCULAR LENS PLACEMENT (IOC);  Surgeon: Galen Manila, MD;  Location: [facility] ORS;  Service: Ophthalmology;  Laterality: Right;  [place]' 01:05AP% 26.6CDE 17.77fluid pack lot # [id number]   CATARACT EXTRACTION W/PHACO Left 03/08/2022   Procedure: CATARACT EXTRACTION PHACO AND INTRAOCULAR LENS PLACEMENT (IOC) LEFT 22.80 01:43.8;   Surgeon: Galen Manila, MD;  [facility];  Service: Ophthalmology;  Laterality: Left;   CORONARY/GRAFT ACUTE MI REVASCULARIZATION N/A 01/16/2018   Procedure: Coronary/Graft Acute MI Revascularization;  Surgeon: Yvonne Kendall, MD;  Location: [facility] INVASIVE CV LAB;  Service: Cardiovascular;  Laterality: N/A;   EYE SURGERY     KNEE SURGERY Right 09/21/2012   arthroscopy   LEFT HEART CATH AND CORONARY ANGIOGRAPHY N/A 01/16/2018   Procedure: LEFT HEART CATH AND CORONARY ANGIOGRAPHY;  Surgeon: Yvonne Kendall, MD;  Location: [facility] INVASIVE CV LAB;  Service: Cardiovascular;  Laterality: N/A;   TOTAL KNEE ARTHROPLASTY Right 05/15/2019   Procedure: TOTAL KNEE ARTHROPLASTY;  Surgeon: Lyndle Herrlich, MD;  Location: [facility] ORS;  Service: Orthopedics;  Laterality: Right;   Social History   Socioeconomic History   Marital status: Married    Spouse name: Claris Gower   Number of children: Not on file   Years of education: 12th grade   Highest education level: High school graduate  Occupational History   Occupation: retired  Tobacco Use   Smoking status: Former    Types: Cigars    Quit date: 05/07/1974    Years since quitting: 49.3   Smokeless tobacco: Former    Types: Chew    Quit date: 10/17/1993  Vaping Use   Vaping status: Never Used  Substance and Sexual Activity   Alcohol use: No   Drug use: No   Sexual activity: Not on file  Other Topics Concern   Not on file  Social History Narrative   Not on file   Social Determinants of Health   Financial Resource Strain: Low Risk  (03/10/2023)   Overall Financial Resource Strain (CARDIA)    Difficulty of Paying Living Expenses: Not hard at all  Food Insecurity: No Food Insecurity (03/10/2023)   Hunger Vital Sign    Worried About Running Out of Food in the Last Year: Never true    Ran Out of Food in the Last Year: Never true  Transportation Needs: No Transportation Needs (03/10/2023)   PRAPARE - Scientist, research (physical sciences) (Medical): No    Lack of Transportation (Non-Medical): No  Physical Activity: Insufficiently Active (03/10/2023)   Exercise Vital Sign    Days of Exercise per Week: 3 days    Minutes of Exercise per Session: 30 min  Stress: No Stress Concern Present (03/10/2023)   [employer] of Occupational Health - Occupational Stress Questionnaire    Feeling of Stress : Not at all  Social Connections: Moderately Integrated (03/10/2023)   Social Connection and Isolation Panel [NHANES]  Frequency of Communication with Friends and Family: More than three times a week    Frequency of Social Gatherings with Friends and Family: More than three times a week    Attends Religious Services: More than 4 times per year    Active Member of [employer] or Organizations: No    Attends Banker Meetings: Never    Marital Status: Married  Catering manager Violence: Not At Risk (03/10/2023)   Humiliation, Afraid, Rape, and Kick questionnaire    Fear of Current or Ex-Partner: No    Emotionally Abused: No    Physically Abused: No    Sexually Abused: No   Family History  Problem Relation Age of Onset   Diabetes Mother    Stroke Mother    Prostate cancer Brother    Melanoma Brother    Hyperlipidemia Paternal Aunt    Current Outpatient Medications on File Prior to Visit  Medication Sig   atorvastatin (LIPITOR) 80 MG tablet Take 1 tablet (80 mg total) by mouth daily at 6 PM.   carvedilol (COREG) 12.5 MG tablet Take 1 tablet (12.5 mg total) by mouth 2 (two) times daily with a meal.   clopidogrel (PLAVIX) 75 MG tablet Take 1 tablet (75 mg total) by mouth daily.   Famotidine (PEPCID AC PO) Take 1 tablet by mouth as needed.   finasteride (PROSCAR) 5 MG tablet Take 1 tablet (5 mg total) by mouth daily.   valsartan (DIOVAN) 40 MG tablet Take 1 tablet (40 mg total) by mouth daily.   No current facility-administered medications on file prior to visit.    Review of Systems Per HPI unless  specifically indicated above     Objective:    BP 138/60 (BP Location: Left Arm, Cuff Size: Normal)   Ht 5\' 8"  (1.727 m)   Wt 180 lb (81.6 kg)   BMI 27.37 kg/m   Wt Readings from Last 3 Encounters:  08/30/23 180 lb (81.6 kg)  07/11/23 178 lb (80.7 kg)  03/10/23 176 lb (79.8 kg)    Physical Exam Vitals and nursing note reviewed.  Constitutional:      General: He is not in acute distress.    Appearance: He is well-developed. He is not diaphoretic.     Comments: Well-appearing, comfortable, cooperative  HENT:     Head: Normocephalic and atraumatic.  Eyes:     General:        Right eye: No discharge.        Left eye: No discharge.     Conjunctiva/sclera: Conjunctivae normal.     Pupils: Pupils are equal, round, and reactive to light.  Neck:     Thyroid: No thyromegaly.     Vascular: No carotid bruit.  Cardiovascular:     Rate and Rhythm: Normal rate and regular rhythm.     Pulses: Normal pulses.     Heart sounds: Normal heart sounds. No murmur heard. Pulmonary:     Effort: Pulmonary effort is normal. No respiratory distress.     Breath sounds: Normal breath sounds. No wheezing or rales.  Abdominal:     General: Bowel sounds are normal. There is no distension.     Palpations: Abdomen is soft. There is no mass.     Tenderness: There is no abdominal tenderness.  Musculoskeletal:        General: No tenderness. Normal range of motion.     Cervical back: Normal range of motion and neck supple.     Right lower leg: No  edema.     Left lower leg: No edema.     Comments: Upper / Lower Extremities: - Normal muscle tone, strength bilateral upper extremities 5/5, lower extremities 5/5  Lymphadenopathy:     Cervical: No cervical adenopathy.  Skin:    General: Skin is warm and dry.     Findings: No erythema or rash.  Neurological:     Mental Status: He is alert and oriented to person, place, and time.     Comments: Distal sensation intact to light touch all extremities   Psychiatric:        Mood and Affect: Mood normal.        Behavior: Behavior normal.        Thought Content: Thought content normal.     Comments: Well groomed, good eye contact, normal speech and thoughts     Diabetic Foot Exam - Simple   Simple Foot Form Diabetic Foot exam was performed with the following findings: Yes 08/30/2023  2:36 PM  Visual Inspection No deformities, no ulcerations, no other skin breakdown bilaterally: Yes Sensation Testing Intact to touch and monofilament testing bilaterally: Yes Pulse Check Posterior Tibialis and Dorsalis pulse intact bilaterally: Yes Comments      Results for orders placed or performed in visit on 08/24/23  TSH  Result Value Ref Range   TSH 1.33 0.40 - 4.50 mIU/L  PSA  Result Value Ref Range   PSA 1.11 < OR = 4.00 ng/mL  Lipid panel  Result Value Ref Range   Cholesterol 115 <200 mg/dL   HDL 43 > OR = 40 mg/dL   Triglycerides 55 <413 mg/dL   LDL Cholesterol (Calc) 58 mg/dL (calc)   Total CHOL/HDL Ratio 2.7 <5.0 (calc)   Non-HDL Cholesterol (Calc) 72 <244 mg/dL (calc)  Hemoglobin W1U  Result Value Ref Range   Hgb A1c MFr Bld 6.8 (H) <5.7 % of total Hgb   Mean Plasma Glucose 148 mg/dL   eAG (mmol/L) 8.2 mmol/L  CBC with Differential/Platelet  Result Value Ref Range   WBC 6.7 3.8 - 10.8 Thousand/uL   RBC 4.25 4.20 - 5.80 Million/uL   Hemoglobin 13.1 (L) 13.2 - 17.1 g/dL   HCT 27.2 53.6 - 64.4 %   MCV 92.0 80.0 - 100.0 fL   MCH 30.8 27.0 - 33.0 pg   MCHC 33.5 32.0 - 36.0 g/dL   RDW 03.4 74.2 - 59.5 %   Platelets 196 140 - 400 Thousand/uL   MPV 9.8 7.5 - 12.5 fL   Neutro Abs 4,335 1,500 - 7,800 cells/uL   Absolute Lymphocytes 1,595 850 - 3,900 cells/uL   Absolute Monocytes 583 200 - 950 cells/uL   Eosinophils Absolute 141 15 - 500 cells/uL   Basophils Absolute 47 0 - 200 cells/uL   Neutrophils Relative % 64.7 %   Total Lymphocyte 23.8 %   Monocytes Relative 8.7 %   Eosinophils Relative 2.1 %   Basophils Relative  0.7 %  COMPLETE METABOLIC PANEL WITH GFR  Result Value Ref Range   Glucose, Bld 123 (H) 65 - 99 mg/dL   BUN 15 7 - 25 mg/dL   Creat 6.38 7.56 - 4.33 mg/dL   eGFR 89 > OR = 60 IR/JJO/8.41Y6   BUN/Creatinine Ratio SEE NOTE: 6 - 22 (calc)   Sodium 142 135 - 146 mmol/L   Potassium 4.2 3.5 - 5.3 mmol/L   Chloride 106 98 - 110 mmol/L   CO2 27 20 - 32 mmol/L   Calcium 8.9 8.6 -  10.3 mg/dL   Total Protein 6.5 6.1 - 8.1 g/dL   Albumin 4.4 3.6 - 5.1 g/dL   Globulin 2.1 1.9 - 3.7 g/dL (calc)   AG Ratio 2.1 1.0 - 2.5 (calc)   Total Bilirubin 0.7 0.2 - 1.2 mg/dL   Alkaline phosphatase (APISO) 100 35 - 144 U/L   AST 19 10 - 35 U/L   ALT 17 9 - 46 U/L      Assessment & Plan:   Problem List Items Addressed This Visit     Atherosclerosis of native coronary artery of native heart with stable angina pectoris (HCC)    Followed by Cardiology Stable w/o complication On med management BB Plavix, ARB Statin      BPH (benign prostatic hyperplasia)    Followed by Urology [facility] On Finasteride, Tamsulosin      Relevant Medications   tamsulosin (FLOMAX) 0.4 MG CAPS capsule   Hyperlipidemia associated with type 2 diabetes mellitus (HCC)    Controlled cholesterol on statin and lifestyle Known CAD Off Aspirin  Plan: 1. Continue current meds - Atorvastating 80mg  daily 2. Continue Plavix for primary ASCVD risk reduction 3. Encourage improved lifestyle - low carb/cholesterol, reduce portion size, continue improving regular exercise      Type 2 diabetes mellitus with other specified complication (HCC)    Well-controlled DM with A1c 6.8 No known complications or hypoglycemia. Complications - other including hyperlipidemia and vascular disease CAD, GERD - increases risk of future cardiovascular complications   Plan 1. No DM medication required. Diet controlled 2. Encourage improved lifestyle - low carb, low sugar diet, reduce portion size, continue improving regular exercise 3. Check CBG , bring  log to next visit for review 4. Continue ASA, ARB, Statin Yearly exam w/ [facility] DM Foot      Other Visit Diagnoses     Annual physical exam    -  Primary   Needs flu shot       Relevant Orders   Flu Vaccine Trivalent High Dose (Fluad) (Completed)   Need for Streptococcus pneumoniae vaccination       Relevant Orders   Pneumococcal conjugate vaccine 20-valent (Completed)   Subacute rhinosinusitis       Relevant Medications   fluticasone (FLONASE) 50 MCG/ACT nasal spray        Updated Health Maintenance information Reviewed recent lab results with patient Encouraged improvement to lifestyle with diet and exercise Goal of weight loss  Allergic Rhinitis Reports needing nasal spray more frequently since the onset of the COVID-19 pandemic. -Continue use of nasal spray as needed.  Knee Stiffness Reports stiffness in the knee. Discussed the use of Voltaren gel despite being on blood thinners. -Trial of Voltaren gel as needed for knee stiffness.  General Health Maintenance / Followup Plans -Administer influenza vaccine and pneumonia vaccine today. -Continue monitoring blood pressure at home. -Annual eye exam scheduled for January.       Orders Placed This Encounter  Procedures   Pneumococcal conjugate vaccine 20-valent   Flu Vaccine Trivalent High Dose (Fluad)    Meds ordered this encounter  Medications   tamsulosin (FLOMAX) 0.4 MG CAPS capsule    Sig: Take 1 capsule (0.4 mg total) by mouth daily.    Dispense:  90 capsule    Refill:  3   fluticasone (FLONASE) 50 MCG/ACT nasal spray    Sig: Place 2 sprays into both nostrils daily.    Dispense:  16 g    Refill:  11  Follow up plan: Return for 6 month PreDM A1c.  Saralyn Pilar, DO [facility] 08/30/2023, 2:18 PM

## 2023-08-30 NOTE — Addendum Note (Signed)
Addended by: Smitty Cords on: 08/30/2023 11:11 PM   Modules accepted: Level of Service

## 2023-08-30 NOTE — Assessment & Plan Note (Signed)
Followed by Cardiology Stable w/o complication On med management BB Plavix, ARB Statin

## 2024-01-01 DIAGNOSIS — H43813 Vitreous degeneration, bilateral: Secondary | ICD-10-CM | POA: Diagnosis not present

## 2024-01-01 DIAGNOSIS — Z961 Presence of intraocular lens: Secondary | ICD-10-CM | POA: Diagnosis not present

## 2024-01-01 NOTE — Progress Notes (Unsigned)
 Cardiology Office Note  Date:  01/02/2024   ID:  Barry [name], DOB 11-04-1945, MRN [id number]  PCP:  Smitty Cords, DO   Chief Complaint  Patient presents with   12 month follow up     "Doing well."     HPI:  78 year old gentleman with history of  CAD, prior PCI to mid RCA 01/2018 hypertension,  hyperlipidemia,  arthritis,  Anxiety  Chronic right knee pain,  Diabetes type 2, HBA1C 6.4 Ejection fraction 45 to 50% April 2019 Intolerance to several blood pressure medications as detailed below EF up to 55 to 60% in 12/20 Atrial fibrillation noted at start of catheterization April 2019, converted to normal sinus rhythm spontaneously who presents for follow-up of his coronary disease,  STEMI April 2019, proximal to mid RCA April 2019  Last seen by myself in clinic  9/24 Feels well today,  Reports blood pressure well-controlled at home Denies significant tachypalpitations concerning for atrial fibrillation Reports he is active, does some biking at the house, likes to spend some time in the garden Prior history shortness of breath, reports symptoms mild  Prior history epistaxis December 2022, none recently No significant lower extremity edema  Stress at home, wife with falls, chronic back pain, recently hospitalized Chronic debility  Lab work reviewed A1C 6.8 Total chol 115, LDL  58  EKG personally reviewed by myself on todays visit EKG Interpretation Date/Time:  Tuesday January 02 2024 16:19:56 EDT Ventricular Rate:  72 PR Interval:  190 QRS Duration:  90 QT Interval:  372 QTC Calculation: 407 R Axis:   -25  Text Interpretation: Sinus rhythm with occasional Premature ventricular complexes When compared with ECG of 11-Jul-2023 15:00, Premature ventricular complexes are now Present Confirmed by Julien Nordmann [phone]) on 01/02/2024 4:55:48 PM    History of chronic shortness of breath Prior work-up including echocardiogram December 2020, normal ejection  fraction EF 55%  Other past medical history reviewed Paroxysmal atrial fibrillation noted prior to PCI, converted spontaneously  Echocardiogram 2018 - Left ventricle: The cavity size was normal. Wall thickness was   increased in a pattern of mild LVH. Systolic function was mildly   reduced. The estimated ejection fraction was in the range of 45%   to 50%. There is hypokinesis of the basal-midinferolateral and   inferior myocardium. Left ventricular diastolic function   parameters were normal for the patient&'s age. - Aortic valve: There was trivial regurgitation. - Right ventricle: The cavity size was normal. Systolic function   was mildly to moderately reduced.  January 16, 2018 chest pain, d/c on 01/18/2018 Cardiac catheterization performed inferior STEMI thrombotic occlusion to the proximal RCA moderate nonobstructive coronary disease of LAD and left circumflex Proximal to mid LAD is aneurysmal Drug-eluting stent placed to proximal to mid RCA 3.0 x 38 mm  Medication intolerances Amlodipine, swelling HCTZ, reported having dry skin, dry mouth Lisinopril, developed a cough Atenolol: did not work Coreg: ok so far  Family history of diabetes, MI Mom with CVA Dad with alzheimers   PMH:   has a past medical history of Arthritis, BPH (benign prostatic hypertrophy), Coronary artery disease, Dilation of aorta (HCC) (09/2019), Dyspnea, GERD (gastroesophageal reflux disease), Hypertension, Myocardial infarction (HCC), and Wears dentures.  PSH:    Past Surgical History:  Procedure Laterality Date   CATARACT EXTRACTION W/PHACO Right 04/12/2016   Procedure: CATARACT EXTRACTION PHACO AND INTRAOCULAR LENS PLACEMENT (IOC);  Surgeon: Galen Manila, MD;  Location: [facility];  Service: Ophthalmology;  Laterality: Right;  [place]' 01:05AP%  26.6CDE 17.32fluid pack lot # [id number]   CATARACT EXTRACTION W/PHACO Left 03/08/2022   Procedure: CATARACT EXTRACTION PHACO AND INTRAOCULAR LENS PLACEMENT (IOC) LEFT  22.80 01:43.8;  Surgeon: Galen Manila, MD;  Location: [facility];  Service: Ophthalmology;  Laterality: Left;   CORONARY/GRAFT ACUTE MI REVASCULARIZATION N/A 01/16/2018   Procedure: Coronary/Graft Acute MI Revascularization;  Surgeon: Yvonne Kendall, MD;  Location: [facility];  Service: Cardiovascular;  Laterality: N/A;   EYE SURGERY     KNEE SURGERY Right 09/21/2012   arthroscopy   LEFT HEART CATH AND CORONARY ANGIOGRAPHY N/A 01/16/2018   Procedure: LEFT HEART CATH AND CORONARY ANGIOGRAPHY;  Surgeon: Yvonne Kendall, MD;  Location: [facility];  Service: Cardiovascular;  Laterality: N/A;   TOTAL KNEE ARTHROPLASTY Right 05/15/2019   Procedure: TOTAL KNEE ARTHROPLASTY;  Surgeon: Lyndle Herrlich, MD;  Location: [facility];  Service: Orthopedics;  Laterality: Right;   Current Outpatient Medications on File Prior to Visit  Medication Sig Dispense Refill   atorvastatin (LIPITOR) 80 MG tablet Take 1 tablet (80 mg total) by mouth daily at 6 PM. 90 tablet 3   carvedilol (COREG) 12.5 MG tablet Take 1 tablet (12.5 mg total) by mouth 2 (two) times daily with a meal. 180 tablet 3   clopidogrel (PLAVIX) 75 MG tablet Take 1 tablet (75 mg total) by mouth daily. 90 tablet 3   Famotidine (PEPCID AC PO) Take 1 tablet by mouth as needed.     finasteride (PROSCAR) 5 MG tablet Take 1 tablet (5 mg total) by mouth daily. 90 tablet 3   fluticasone (FLONASE) 50 MCG/ACT nasal spray Place 2 sprays into both nostrils daily. 16 g 11   tamsulosin (FLOMAX) 0.4 MG CAPS capsule Take 1 capsule (0.4 mg total) by mouth daily. 90 capsule 3   valsartan (DIOVAN) 40 MG tablet Take 1 tablet (40 mg total) by mouth daily. 90 tablet 3   No current facility-administered medications on file prior to visit.    Allergies:   Omnicef [cefdinir] and Lisinopril   Social History:  The patient  reports that he quit smoking about 49 years ago. His smoking use included cigars. He quit smokeless tobacco use about 30  years ago.  His smokeless tobacco use included chew. He reports that he does not drink alcohol and does not use drugs.   Family History:   family history includes Diabetes in his mother; Hyperlipidemia in his paternal aunt; Melanoma in his brother; Prostate cancer in his brother; Stroke in his mother.    Review of Systems: Review of Systems  Constitutional: Negative.   HENT: Negative.    Respiratory: Negative.    Cardiovascular: Negative.   Gastrointestinal: Negative.   Musculoskeletal: Negative.   Neurological: Negative.   Psychiatric/Behavioral: Negative.    All other systems reviewed and are negative.   PHYSICAL EXAM: VS:  BP 120/62 (BP Location: Left Arm, Patient Position: Sitting, Cuff Size: Normal)   Pulse 72   Ht 5\' 8"  (1.727 m)   Wt 175 lb 4 oz (79.5 kg)   SpO2 97%   BMI 26.65 kg/m  , BMI Body mass index is 26.65 kg/m.  Constitutional:  oriented to person, place, and time. No distress.  HENT:  Head: Grossly normal Eyes:  no discharge. No scleral icterus.  Neck: No JVD, no carotid bruits  Cardiovascular: Regular rate and rhythm, no murmurs appreciated Pulmonary/Chest: Clear to auscultation bilaterally, no wheezes or rails Abdominal: Soft.  no distension.  no tenderness.  Musculoskeletal: Normal  range of motion Neurological:  normal muscle tone. Coordination normal. No atrophy Skin: Skin warm and dry Psychiatric: normal affect, pleasant  Recent Labs: 08/24/2023: ALT 17; BUN 15; Creat 0.85; Hemoglobin 13.1; Platelets 196; Potassium 4.2; Sodium 142; TSH 1.33    Lipid Panel Lab Results  Component Value Date   CHOL 115 08/24/2023   HDL 43 08/24/2023   LDLCALC 58 08/24/2023   TRIG 55 08/24/2023      Wt Readings from Last 3 Encounters:  01/02/24 175 lb 4 oz (79.5 kg)  08/30/23 180 lb (81.6 kg)  07/11/23 178 lb (80.7 kg)     ASSESSMENT AND PLAN:  Coronary artery disease with stable angina Prior STEMI presented April 2019 with STEMI occluded proximal  RCA nonobstructive LAD disease, circumflex disease Stents placed to RCA,  Echocardiogram normal ejection fraction December 2020 Recommend he continue Plavix 75 daily, off aspirin secondary to epistaxis Currently with no symptoms of angina. No further workup at this time. Continue current medication regimen.  Essential (primary) hypertension - Blood pressure is well controlled on today's visit. No changes made to the medications.  Irregular rhythm Prior history atrial fibrillation noted at time of catheterization April 2019 Repeat Zio x2 with no A-fib Continue Plavix for now  Pure hypercholesterolemia -  Cholesterol is at goal on the current lipid regimen. No changes to the medications were made.  Diabetes mellitus due to underlying condition with hyperosmolarity without coma, without long-term current use of insulin (HCC) A1C well-controlled  Shortness of breath Recommend he continue exercise program, uses recumbent bike  Chronic cough Improved    Orders Placed This Encounter  Procedures   EKG 12-Lead     Signed, Dossie Arbour, M.D., Ph.D. 01/02/2024  [facility] [place], [place] [phone]

## 2024-01-02 ENCOUNTER — Ambulatory Visit: Payer: PPO | Attending: Cardiovascular Disease | Admitting: Cardiovascular Disease

## 2024-01-02 ENCOUNTER — Encounter: Payer: Self-pay | Admitting: Cardiovascular Disease

## 2024-01-02 VITALS — BP 120/62 | HR 72 | Ht 68.0 in | Wt 175.2 lb

## 2024-01-02 DIAGNOSIS — I493 Ventricular premature depolarization: Secondary | ICD-10-CM

## 2024-01-02 DIAGNOSIS — I77819 Aortic ectasia, unspecified site: Secondary | ICD-10-CM | POA: Diagnosis not present

## 2024-01-02 DIAGNOSIS — I480 Paroxysmal atrial fibrillation: Secondary | ICD-10-CM | POA: Diagnosis not present

## 2024-01-02 DIAGNOSIS — R002 Palpitations: Secondary | ICD-10-CM

## 2024-01-02 DIAGNOSIS — I10 Essential (primary) hypertension: Secondary | ICD-10-CM

## 2024-01-02 DIAGNOSIS — I471 Supraventricular tachycardia, unspecified: Secondary | ICD-10-CM

## 2024-01-02 DIAGNOSIS — I5032 Chronic diastolic (congestive) heart failure: Secondary | ICD-10-CM | POA: Diagnosis not present

## 2024-01-02 DIAGNOSIS — I4891 Unspecified atrial fibrillation: Secondary | ICD-10-CM | POA: Diagnosis not present

## 2024-01-02 DIAGNOSIS — I2510 Atherosclerotic heart disease of native coronary artery without angina pectoris: Secondary | ICD-10-CM

## 2024-01-02 DIAGNOSIS — E785 Hyperlipidemia, unspecified: Secondary | ICD-10-CM | POA: Diagnosis not present

## 2024-01-02 MED ORDER — VALSARTAN 40 MG PO TABS: 40.0000 mg | ORAL_TABLET | Freq: Every day | ORAL | 3 refills | Status: DC

## 2024-01-02 MED ORDER — CLOPIDOGREL BISULFATE 75 MG PO TABS: 75.0000 mg | ORAL_TABLET | Freq: Every day | ORAL | 3 refills | Status: AC

## 2024-01-02 MED ORDER — ATORVASTATIN CALCIUM 80 MG PO TABS: 80.0000 mg | ORAL_TABLET | Freq: Every day | ORAL | 3 refills | Status: AC

## 2024-01-02 MED ORDER — CARVEDILOL 12.5 MG PO TABS: 12.5000 mg | ORAL_TABLET | Freq: Two times a day (BID) | ORAL | 3 refills | Status: AC

## 2024-01-02 NOTE — Patient Instructions (Signed)

## 2024-02-27 ENCOUNTER — Encounter: Payer: Self-pay | Admitting: Family Medicine

## 2024-02-27 ENCOUNTER — Ambulatory Visit: Payer: Self-pay | Admitting: Family Medicine

## 2024-02-27 ENCOUNTER — Other Ambulatory Visit: Payer: Self-pay | Admitting: Family Medicine

## 2024-02-27 VITALS — BP 130/60 | HR 60 | Ht 68.0 in | Wt 177.4 lb

## 2024-02-27 DIAGNOSIS — E1169 Type 2 diabetes mellitus with other specified complication: Secondary | ICD-10-CM | POA: Diagnosis not present

## 2024-02-27 DIAGNOSIS — E785 Hyperlipidemia, unspecified: Secondary | ICD-10-CM

## 2024-02-27 DIAGNOSIS — I10 Essential (primary) hypertension: Secondary | ICD-10-CM

## 2024-02-27 DIAGNOSIS — Z0000 Encounter for general adult medical examination without abnormal findings: Secondary | ICD-10-CM

## 2024-02-27 DIAGNOSIS — N401 Enlarged prostate with lower urinary tract symptoms: Secondary | ICD-10-CM

## 2024-02-27 DIAGNOSIS — I25118 Atherosclerotic heart disease of native coronary artery with other forms of angina pectoris: Secondary | ICD-10-CM

## 2024-02-27 LAB — POCT GLYCOSYLATED HEMOGLOBIN (HGB A1C): Hemoglobin A1C: 6.5 % — AB (ref 4.0–5.6)

## 2024-02-27 NOTE — Progress Notes (Signed)
 Subjective:    Patient ID: Barry [name], male    DOB: 26-Jan-1946, 78 y.o.   MRN: [id number]  Barry [name] is a 78 y.o. male presenting on 02/27/2024 for Diabetes   HPI  Discussed the use of AI scribe software for clinical note transcription with the patient, who gave verbal consent to proceed.  History of Present Illness   [name] is a 78 year old male with diabetes and hypertension who presents for a six-month follow-up visit.   He experiences a persistent morning cough with thick saliva, which resolves after a few coughs. This has been a consistent issue, and he describes the saliva as thick. He has previously used a nasal spray for a couple of months, but it is unclear if it has made a significant difference. No breathing difficulties at night.  He experiences numbness and tingling in his thumb and index finger, particularly during activities like driving or holding objects. He has a known history of arthritis, which may contribute to these symptoms. He has not used a wrist brace previously.      CHRONIC DM, Type 2: A1c now is 6.5, previous 6.7 to 6.8 Not checking CBG at home Meds: None - diet controlled Currently on Losartan  Lifestyle: - Diet (improved DM diet) - Exercise (active Denies hypoglycemia   CHRONIC HTN: Reports no new concerns. Home BP readings normal controlled, similar to before Current Meds - Carvedilol  12.5mg  TWICE A DAY, Valsartan  40mg  daily Reports good compliance, took meds today. Tolerating well, w/o complaints.     BPH with LUTS / Prostate Cancer Screening Followed Dr Cher Cordial - He is doing well on Flomax  0.4mg  daily but tried x 2 dose in past without change Still on Finasteride  per Urology Some LUTS - Prior treatments including Rapaflo , Silodosin  Denies hematuria, dysuria, abdominal pain or flank pain   CAD, history of STEMI / PAF / HLD Followed by Cardiology, Dr Jerelene Monday On Plavix , Carvedilol , Atorvastatin  80mg  Off  Aspirin  Denies any chest pain, dyspnea, anginal symptoms, palpitations   S/p R knee TKR History of Right Knee Pain, Arthritis S/p surgery 04/2019   Health Maintenance:       02/27/2024    3:33 PM 08/30/2023    2:49 PM 03/10/2023    1:11 PM  Depression screen PHQ 2/9  Decreased Interest 1 1 0  Down, Depressed, Hopeless 0 0 0  PHQ - 2 Score 1 1 0  Altered sleeping 0  0  Tired, decreased energy 1  0  Change in appetite 0  0  Feeling bad or failure about yourself  0  0  Trouble concentrating 1  0  Moving slowly or fidgety/restless 1  0  Suicidal thoughts 0  0  PHQ-9 Score 4  0  Difficult doing work/chores Not difficult at all  Not difficult at all       02/27/2024    3:33 PM 08/30/2023    2:49 PM 02/21/2023   11:07 AM 12/05/2022    3:12 PM  GAD 7 : Generalized Anxiety Score  Nervous, Anxious, on Edge 1 0 0 0  Control/stop worrying 0 1 0 0  Worry too much - different things 0 1 0 0  Trouble relaxing  0 1 1  Restless 0 0 0 0  Easily annoyed or irritable 1 1 1  0  Afraid - awful might happen 0 0 0 0  Total GAD 7 Score  3 2 1   Anxiety Difficulty Not difficult at all  Not difficult at all     Past Medical History:  Diagnosis Date  . Arthritis   . BPH (benign prostatic hypertrophy)   . Coronary artery disease   . Dilation of aorta (HCC) 09/2019   39 mm  . Dyspnea    d/t heart problem.  worsens in hot or cold extremes  . GERD (gastroesophageal reflux disease)   . Hypertension   . Myocardial infarction [facility])    April 2019. 1 stent placed  . Wears dentures    partial upper   Past Surgical History:  Procedure Laterality Date  . CATARACT EXTRACTION W/PHACO Right 04/12/2016   Procedure: CATARACT EXTRACTION PHACO AND INTRAOCULAR LENS PLACEMENT (IOC);  Surgeon: Clair Crews, MD;  Location: [facility];  Service: Ophthalmology;  Laterality: Right;  US ' 01:05AP% 26.6CDE 17.50fluid pack lot # [id number]  . CATARACT EXTRACTION W/PHACO Left 03/08/2022   Procedure: CATARACT  EXTRACTION PHACO AND INTRAOCULAR LENS PLACEMENT (IOC) LEFT 22.80 01:43.8;  Surgeon: Clair Crews, MD;  Location: [facility];  Service: Ophthalmology;  Laterality: Left;  . CORONARY/GRAFT ACUTE MI REVASCULARIZATION N/A 01/16/2018   Procedure: Coronary/Graft Acute MI Revascularization;  Surgeon: Sammy Crisp, MD;  Location: [facility];  Service: Cardiovascular;  Laterality: N/A;  . EYE SURGERY    . KNEE SURGERY Right 09/21/2012   arthroscopy  . LEFT HEART CATH AND CORONARY ANGIOGRAPHY N/A 01/16/2018   Procedure: LEFT HEART CATH AND CORONARY ANGIOGRAPHY;  Surgeon: Sammy Crisp, MD;  Location: [facility];  Service: Cardiovascular;  Laterality: N/A;  . TOTAL KNEE ARTHROPLASTY Right 05/15/2019   Procedure: TOTAL KNEE ARTHROPLASTY;  Surgeon: Jerlyn Moons, MD;  Location: [facility];  Service: Orthopedics;  Laterality: Right;   Social History   Socioeconomic History  . Marital status: Married    Spouse name: Soyla Duverney  . Number of children: Not on file  . Years of education: 12th grade  . Highest education level: High school graduate  Occupational History  . Occupation: retired  Tobacco Use  . Smoking status: Former    Types: Cigars    Quit date: 05/07/1974    Years since quitting: 49.8  . Smokeless tobacco: Former    Types: Chew    Quit date: 10/17/1993  Vaping Use  . Vaping status: Never Used  Substance and Sexual Activity  . Alcohol use: No  . Drug use: No  . Sexual activity: Not on file  Other Topics Concern  . Not on file  Social History Narrative  . Not on file   Social Drivers of Health   Financial Resource Strain: Low Risk  (03/10/2023)   Overall Financial Resource Strain (CARDIA)   . Difficulty of Paying Living Expenses: Not hard at all  Food Insecurity: No Food Insecurity (03/10/2023)   Hunger Vital Sign   . Worried About Programme researcher, broadcasting/film/video in the Last Year: Never true   . Ran Out of Food in the Last Year: Never true  Transportation  Needs: No Transportation Needs (03/10/2023)   PRAPARE - Transportation   . Lack of Transportation (Medical): No   . Lack of Transportation (Non-Medical): No  Physical Activity: Insufficiently Active (03/10/2023)   Exercise Vital Sign   . Days of Exercise per Week: 3 days   . Minutes of Exercise per Session: 30 min  Stress: No Stress Concern Present (03/10/2023)   [employer] of Occupational Health - Occupational Stress Questionnaire   . Feeling of Stress : Not at all  Social Connections: Moderately Integrated (03/10/2023)  Social Connection and Isolation Panel [NHANES]   . Frequency of Communication with Friends and Family: More than three times a week   . Frequency of Social Gatherings with Friends and Family: More than three times a week   . Attends Religious Services: More than 4 times per year   . Active Member of Clubs or Organizations: No   . Attends Banker Meetings: Never   . Marital Status: Married  Catering manager Violence: Not At Risk (03/10/2023)   Humiliation, Afraid, Rape, and Kick questionnaire   . Fear of Current or Ex-Partner: No   . Emotionally Abused: No   . Physically Abused: No   . Sexually Abused: No   Family History  Problem Relation Age of Onset  . Diabetes Mother   . Stroke Mother   . Prostate cancer Brother   . Melanoma Brother   . Hyperlipidemia Paternal Aunt    Current Outpatient Medications on File Prior to Visit  Medication Sig  . atorvastatin  (LIPITOR) 80 MG tablet Take 1 tablet (80 mg total) by mouth daily at 6 PM.  . carvedilol  (COREG ) 12.5 MG tablet Take 1 tablet (12.5 mg total) by mouth 2 (two) times daily with a meal.  . clopidogrel  (PLAVIX ) 75 MG tablet Take 1 tablet (75 mg total) by mouth daily.  . Famotidine  (PEPCID  AC PO) Take 1 tablet by mouth as needed.  . finasteride  (PROSCAR ) 5 MG tablet Take 1 tablet (5 mg total) by mouth daily.  . fluticasone  (FLONASE ) 50 MCG/ACT nasal spray Place 2 sprays into both nostrils daily.   . tamsulosin  (FLOMAX ) 0.4 MG CAPS capsule Take 1 capsule (0.4 mg total) by mouth daily.  . valsartan  (DIOVAN ) 40 MG tablet Take 1 tablet (40 mg total) by mouth daily.   No current facility-administered medications on file prior to visit.    Review of Systems Per HPI unless specifically indicated above     Objective:     BP 130/60 (BP Location: Left Arm, Cuff Size: Normal)   Pulse 60   Ht 5\' 8"  (1.727 m)   Wt 177 lb 6 oz (80.5 kg)   SpO2 97%   BMI 26.97 kg/m   Wt Readings from Last 3 Encounters:  02/27/24 177 lb 6 oz (80.5 kg)  01/02/24 175 lb 4 oz (79.5 kg)  08/30/23 180 lb (81.6 kg)    Physical Exam Vitals and nursing note reviewed.  Constitutional:      General: He is not in acute distress.    Appearance: He is well-developed. He is not diaphoretic.     Comments: Well-appearing, comfortable, cooperative  HENT:     Head: Normocephalic and atraumatic.  Eyes:     General:        Right eye: No discharge.        Left eye: No discharge.     Conjunctiva/sclera: Conjunctivae normal.  Neck:     Thyroid : No thyromegaly.  Cardiovascular:     Rate and Rhythm: Normal rate and regular rhythm.     Pulses: Normal pulses.     Heart sounds: Normal heart sounds. No murmur heard. Pulmonary:     Effort: Pulmonary effort is normal. No respiratory distress.     Breath sounds: Normal breath sounds. No wheezing or rales.  Musculoskeletal:        General: Normal range of motion.     Cervical back: Normal range of motion and neck supple.  Lymphadenopathy:     Cervical: No cervical adenopathy.  Skin:  General: Skin is warm and dry.     Findings: No erythema or rash.  Neurological:     Mental Status: He is alert and oriented to person, place, and time. Mental status is at baseline.  Psychiatric:        Behavior: Behavior normal.     Comments: Well groomed, good eye contact, normal speech and thoughts    Results for orders placed or performed in visit on 02/27/24  POCT HgB A1C    Collection Time: 02/27/24  3:38 PM  Result Value Ref Range   Hemoglobin A1C 6.5 (A) 4.0 - 5.6 %   HbA1c POC (<> result, manual entry)     HbA1c, POC (prediabetic range)     HbA1c, POC (controlled diabetic range)        Assessment & Plan:   Problem List Items Addressed This Visit     Type 2 diabetes mellitus with other specified complication (HCC) - Primary   Relevant Orders   POCT HgB A1C (Completed)     Updated Health Maintenance information Reviewed recent lab results with patient Encouraged improvement to lifestyle with diet and exercise Goal of weight loss   Chronic cough with thick saliva Morning cough with thick saliva likely due to overnight dehydration and secretion settling. Nasal spray use not significantly effective. - Retry nasal spray Flonse for a few weeks to assess efficacy. Advise may need longer term Okay to take anti histamine as well - Consider Mucinex  for thinning secretions.  R sided Carpal tunnel syndrome Numbness and tingling in thumb and index finger likely due to carpal tunnel syndrome, exacerbated by arthritis. - Recommend wrist splint during the day to maintain neutral wrist position.  Essential (primary) hypertension Blood pressure slightly elevated at 142, improved to 130/60 after rest. Home readings stable at 129/74, indicating good control with current regimen. - Continue current blood pressure medications. - Regular home blood pressure monitoring.  Type 2 diabetes mellitus without complications A1c at 6.5, consistent with previous readings, indicating good glucose control through diet.        Orders Placed This Encounter  Procedures  . POCT HgB A1C    No orders of the defined types were placed in this encounter.    Follow up plan: Return for 6 month fasting lab > 1 week later Annual Physical.  Future 08/30/24  Domingo Friend, DO [facility] 02/27/2024, 3:55 PM

## 2024-02-27 NOTE — Patient Instructions (Addendum)
 Thank you for coming to the office today.  BP is controlled.  Keep current medications  Recent Labs    08/24/23 0815 02/27/24 1538  HGBA1C 6.8* 6.5*   Try carpal tunnel wrist splint on Right hand for the tingling in Thumb and Index finger, the same nerve Median nerve affects these. So goal is to avoid repetitive strain and pinching on this one. It can also pinch at night. Try wearing the splint at night too if you can.  Start nasal steroid Flonase  2 sprays in each nostril daily - continue for longer for the phlegm  If it does not improve we can refer to Orthopedic  DUE for FASTING BLOOD WORK (no food or drink after midnight before the lab appointment, only water or coffee without cream/sugar on the morning of)  SCHEDULE "Lab Only" visit in the morning at the clinic for lab draw in 6 MONTHS   - Make sure Lab Only appointment is at about 1 week before your next appointment, so that results will be available  For Lab Results, once available within 2-3 days of blood draw, you can can log in to MyChart online to view your results and a brief explanation. Also, we can discuss results at next follow-up visit.   Please schedule a Follow-up Appointment to: Return for 6 month fasting lab > 1 week later Annual Physical.  If you have any other questions or concerns, please feel free to call the office or send a message through MyChart. You may also schedule an earlier appointment if necessary.  Additionally, you may be receiving a survey about your experience at our office within a few days to 1 week by e-mail or mail. We value your feedback.  Domingo Friend, DO [facility], [place]

## 2024-02-29 ENCOUNTER — Ambulatory Visit: Payer: Self-pay | Admitting: Urology

## 2024-02-29 ENCOUNTER — Encounter: Payer: Self-pay | Admitting: Urology

## 2024-02-29 VITALS — BP 131/70 | HR 73 | Ht 69.0 in | Wt 177.0 lb

## 2024-02-29 DIAGNOSIS — N401 Enlarged prostate with lower urinary tract symptoms: Secondary | ICD-10-CM

## 2024-02-29 LAB — BLADDER SCAN AMB NON-IMAGING

## 2024-02-29 MED ORDER — FINASTERIDE 5 MG PO TABS: 5.0000 mg | ORAL_TABLET | Freq: Every day | ORAL | 3 refills | Status: AC

## 2024-02-29 NOTE — Progress Notes (Signed)
 I, Maysun Jamey Mccallum, acting as a Neurosurgeon for Geraline Knapp, MD., have documented all relevant documentation on the behalf of Geraline Knapp, MD, as directed by Geraline Knapp, MD while in the presence of Geraline Knapp, MD.  Discussed the use of AI scribe software for clinical note transcription with the patient, who gave verbal consent to proceed.   02/29/2024 11:31 PM   Barry [name] 1946-08-17 [id number]  Referring provider: Raina Bunting, DO [address] [place],  [place] [address]  Chief Complaint  Patient presents with   Benign Prostatic Hypertrophy   Urologic history: 1. BPH with LUTS -Tamsulosin /finasteride   HPI: Barry [name] is a 78 y.o. male presents for annual follow-up.   No problems since last year's visit; remains on tamsulosin /finasteride  Denies dysuria, gross hematuria  No flank, abdominal, or pelvic pain.   PMH: Past Medical History:  Diagnosis Date   Arthritis    BPH (benign prostatic hypertrophy)    Coronary artery disease    Dilation of aorta (HCC) 09/2019   39 mm   Dyspnea    d/t heart problem.  worsens in hot or cold extremes   GERD (gastroesophageal reflux disease)    Hypertension    Myocardial infarction [facility])    April 2019. 1 stent placed   Wears dentures    partial upper    Surgical History: Past Surgical History:  Procedure Laterality Date   CATARACT EXTRACTION W/PHACO Right 04/12/2016   Procedure: CATARACT EXTRACTION PHACO AND INTRAOCULAR LENS PLACEMENT (IOC);  Surgeon: Clair Crews, MD;  Location: [facility] ORS;  Service: Ophthalmology;  Laterality: Right;  US ' 01:05AP% 26.6CDE 17.37fluid pack lot # [id number]   CATARACT EXTRACTION W/PHACO Left 03/08/2022   Procedure: CATARACT EXTRACTION PHACO AND INTRAOCULAR LENS PLACEMENT (IOC) LEFT 22.80 01:43.8;  Surgeon: Clair Crews, MD;  Location: [facility];  Service: Ophthalmology;  Laterality: Left;   CORONARY/GRAFT ACUTE MI REVASCULARIZATION N/A 01/16/2018    Procedure: Coronary/Graft Acute MI Revascularization;  Surgeon: Sammy Crisp, MD;  Location: [facility] INVASIVE CV LAB;  Service: Cardiovascular;  Laterality: N/A;   EYE SURGERY     KNEE SURGERY Right 09/21/2012   arthroscopy   LEFT HEART CATH AND CORONARY ANGIOGRAPHY N/A 01/16/2018   Procedure: LEFT HEART CATH AND CORONARY ANGIOGRAPHY;  Surgeon: Sammy Crisp, MD;  Location: [facility] INVASIVE CV LAB;  Service: Cardiovascular;  Laterality: N/A;   TOTAL KNEE ARTHROPLASTY Right 05/15/2019   Procedure: TOTAL KNEE ARTHROPLASTY;  Surgeon: Jerlyn Moons, MD;  Location: [facility] ORS;  Service: Orthopedics;  Laterality: Right;    Home Medications:  Allergies as of 02/29/2024       Reactions   Omnicef  [cefdinir ] Shortness Of Breath, Nausea And Vomiting   Patient states that he had chills    Lisinopril Cough        Medication List        Accurate as of Feb 29, 2024 11:31 PM. If you have any questions, ask your nurse or doctor.          atorvastatin  80 MG tablet Commonly known as: LIPITOR Take 1 tablet (80 mg total) by mouth daily at 6 PM.   carvedilol  12.5 MG tablet Commonly known as: COREG  Take 1 tablet (12.5 mg total) by mouth 2 (two) times daily with a meal.   clopidogrel  75 MG tablet Commonly known as: PLAVIX  Take 1 tablet (75 mg total) by mouth daily.   finasteride  5 MG tablet Commonly known as: PROSCAR  Take 1 tablet (5  mg total) by mouth daily.   fluticasone  50 MCG/ACT nasal spray Commonly known as: FLONASE  Place 2 sprays into both nostrils daily.   PEPCID  AC PO Take 1 tablet by mouth as needed.   tamsulosin  0.4 MG Caps capsule Commonly known as: FLOMAX  Take 1 capsule (0.4 mg total) by mouth daily.   valsartan  40 MG tablet Commonly known as: Diovan  Take 1 tablet (40 mg total) by mouth daily.        Allergies:  Allergies  Allergen Reactions   Omnicef  [Cefdinir ] Shortness Of Breath and Nausea And Vomiting    Patient states that he had chills    Lisinopril  Cough    Family History: Family History  Problem Relation Age of Onset   Diabetes Mother    Stroke Mother    Prostate cancer Brother    Melanoma Brother    Hyperlipidemia Paternal Aunt     Social History:  reports that he quit smoking about 49 years ago. His smoking use included cigars. He quit smokeless tobacco use about 30 years ago.  His smokeless tobacco use included chew. He reports that he does not drink alcohol and does not use drugs.   Physical Exam: BP 131/70   Pulse 73   Ht 5\' 9"  (1.753 m)   Wt 177 lb (80.3 kg)   BMI 26.14 kg/m   Constitutional:  Alert and oriented, No acute distress. HEENT: [place] AT Respiratory: Normal respiratory effort, no increased work of breathing.  Assessment & Plan:    1. BPH with lower urinary tract symptoms His PCP refills tamsulosin  and rx finasteride  sent to pharmacy.  PVR today was 134 mL, however this was obtained 30 minutes after voiding.  1 year follow-up with [facility].  I have reviewed the above documentation for accuracy and completeness, and I agree with the above.   Geraline Knapp, MD  [facility] [address] [place], [place] [address] [phone]

## 2024-03-01 ENCOUNTER — Ambulatory Visit: Payer: PPO | Admitting: Urology

## 2024-03-15 ENCOUNTER — Ambulatory Visit: Payer: PPO

## 2024-03-15 DIAGNOSIS — Z0000 Encounter for general adult medical examination without abnormal findings: Secondary | ICD-10-CM | POA: Diagnosis not present

## 2024-03-15 NOTE — Patient Instructions (Addendum)
 [name] , Thank you for taking time out of your busy schedule to complete your Annual Wellness Visit with me. I enjoyed our conversation and look forward to speaking with you again next year. I, as well as your care team,  appreciate your ongoing commitment to your health goals. Please review the following plan we discussed and let me know if I can assist you in the future.   Follow up Visits: Next Medicare AWV with our clinical staff:   03/21/25 @ 10:50 AM BY PHONE Have you seen your provider in the last 6 months (3 months if uncontrolled diabetes)? Yes  Clinician Recommendations:  Aim for 30 minutes of exercise or brisk walking, 6-8 glasses of water, and 5 servings of fruits and vegetables each day. TAKE CARE!      This is a list of the screening recommended for you and due dates:  Health Maintenance  Topic Date Due   Zoster (Shingles) Vaccine (1 of 2) Never done   DTaP/Tdap/Td vaccine (4 - Td or Tdap) 05/17/2021   Eye exam for diabetics  02/12/2023   COVID-19 Vaccine (3 - 2024-25 season) 06/18/2023   Yearly kidney health urinalysis for diabetes  02/21/2024   Flu Shot  05/17/2024   Yearly kidney function blood test for diabetes  08/23/2024   Complete foot exam   08/29/2024   Hemoglobin A1C  08/29/2024   Medicare Annual Wellness Visit  03/15/2025   Pneumonia Vaccine  Completed   Hepatitis C Screening  Completed   HPV Vaccine  Aged Out   Meningitis B Vaccine  Aged Out   Colon Cancer Screening  Discontinued    Advanced directives: (ACP Link)Information on Advanced Care Planning can be found at [place]  Secretary of [facility] Advance Health Care Directives Advance Health Care Directives. [email or web address]  Advance Care Planning is important because it:  [x]  Makes sure you receive the medical care that is consistent with your values, goals, and preferences  [x]  It provides guidance to your family and loved ones and reduces their decisional burden about whether or not they are making the  right decisions based on your wishes.  Follow the link provided in your after visit summary or read over the paperwork we have mailed to you to help you started getting your Advance Directives in place. If you need assistance in completing these, please reach out to us  so that we can help you!

## 2024-03-15 NOTE — Progress Notes (Signed)
 Subjective:   Barry [name] is a 78 y.o. who presents for a Medicare Wellness preventive visit.  As a reminder, Annual Wellness Visits don't include a physical exam, and some assessments may be limited, especially if this visit is performed virtually. We may recommend an in-person follow-up visit with your provider if needed.  Visit Complete: Virtual I connected with  Barry [name] on 03/15/24 by a audio enabled telemedicine application and verified that I am speaking with the correct person using two identifiers.  Patient Location: Home  Provider Location: Home Office  I discussed the limitations of evaluation and management by telemedicine. The patient expressed understanding and agreed to proceed.  Vital Signs: Because this visit was a virtual/telehealth visit, some criteria may be missing or patient reported. Any vitals not documented were not able to be obtained and vitals that have been documented are patient reported.  VideoDeclined- This patient declined Librarian, academic. Therefore the visit was completed with audio only.  Persons Participating in Visit: Patient.  AWV Questionnaire: No: Patient Medicare AWV questionnaire was not completed prior to this visit.  Cardiac Risk Factors include: advanced age (>72men, >31 women);diabetes mellitus;hypertension;dyslipidemia;male gender     Objective:     There were no vitals filed for this visit. There is no height or weight on file to calculate BMI.     03/15/2024   11:39 AM 03/10/2023    1:12 PM 11/27/2022   10:00 PM 03/08/2022   11:04 AM 10/11/2021    5:09 AM 10/01/2021    9:01 AM 09/08/2020   10:21 AM  Advanced Directives  Does Patient Have a Medical Advance Directive? No No No Yes No No No  Type of Theme park manager;Living will     Does patient want to make changes to medical advance directive?    No - Patient declined     Copy of Healthcare  Power of Attorney in Chart?    No - copy requested     Would patient like information on creating a medical advance directive? No - Patient declined No - Patient declined No - Patient declined  No - Patient declined No - Patient declined     Current Medications (verified) Outpatient Encounter Medications as of 03/15/2024  Medication Sig   atorvastatin  (LIPITOR) 80 MG tablet Take 1 tablet (80 mg total) by mouth daily at 6 PM.   carvedilol  (COREG ) 12.5 MG tablet Take 1 tablet (12.5 mg total) by mouth 2 (two) times daily with a meal.   clopidogrel  (PLAVIX ) 75 MG tablet Take 1 tablet (75 mg total) by mouth daily.   Famotidine  (PEPCID  AC PO) Take 1 tablet by mouth as needed.   finasteride  (PROSCAR ) 5 MG tablet Take 1 tablet (5 mg total) by mouth daily.   fluticasone  (FLONASE ) 50 MCG/ACT nasal spray Place 2 sprays into both nostrils daily.   tamsulosin  (FLOMAX ) 0.4 MG CAPS capsule Take 1 capsule (0.4 mg total) by mouth daily.   valsartan  (DIOVAN ) 40 MG tablet Take 1 tablet (40 mg total) by mouth daily.   No facility-administered encounter medications on file as of 03/15/2024.    Allergies (verified) Omnicef  [cefdinir ] and Lisinopril   History: Past Medical History:  Diagnosis Date   Arthritis    BPH (benign prostatic hypertrophy)    Coronary artery disease    Dilation of aorta (HCC) 09/2019   39 mm   Dyspnea    d/t heart problem.  worsens in  hot or cold extremes   GERD (gastroesophageal reflux disease)    Hypertension    Myocardial infarction [facility])    April 2019. 1 stent placed   Wears dentures    partial upper   Past Surgical History:  Procedure Laterality Date   CATARACT EXTRACTION W/PHACO Right 04/12/2016   Procedure: CATARACT EXTRACTION PHACO AND INTRAOCULAR LENS PLACEMENT (IOC);  Surgeon: Clair Crews, MD;  Location: [facility] ORS;  Service: Ophthalmology;  Laterality: Right;  US ' 01:05AP% 26.6CDE 17.53fluid pack lot # [id number] H   CATARACT EXTRACTION W/PHACO Left 03/08/2022    Procedure: CATARACT EXTRACTION PHACO AND INTRAOCULAR LENS PLACEMENT (IOC) LEFT 22.80 01:43.8;  Surgeon: Clair Crews, MD;  Location: [facility];  Service: Ophthalmology;  Laterality: Left;   CORONARY/GRAFT ACUTE MI REVASCULARIZATION N/A 01/16/2018   Procedure: Coronary/Graft Acute MI Revascularization;  Surgeon: Sammy Crisp, MD;  Location: [facility] INVASIVE CV LAB;  Service: Cardiovascular;  Laterality: N/A;   EYE SURGERY     KNEE SURGERY Right 09/21/2012   arthroscopy   LEFT HEART CATH AND CORONARY ANGIOGRAPHY N/A 01/16/2018   Procedure: LEFT HEART CATH AND CORONARY ANGIOGRAPHY;  Surgeon: Sammy Crisp, MD;  Location: [facility] INVASIVE CV LAB;  Service: Cardiovascular;  Laterality: N/A;   TOTAL KNEE ARTHROPLASTY Right 05/15/2019   Procedure: TOTAL KNEE ARTHROPLASTY;  Surgeon: Jerlyn Moons, MD;  Location: [facility] ORS;  Service: Orthopedics;  Laterality: Right;   Family History  Problem Relation Age of Onset   Diabetes Mother    Stroke Mother    Prostate cancer Brother    Melanoma Brother    Hyperlipidemia Paternal Aunt    Social History   Socioeconomic History   Marital status: Married    Spouse name: Soyla Duverney   Number of children: Not on file   Years of education: 12th grade   Highest education level: High school graduate  Occupational History   Occupation: retired  Tobacco Use   Smoking status: Former    Types: Cigars    Quit date: 05/07/1974    Years since quitting: 49.8   Smokeless tobacco: Former    Types: Chew    Quit date: 10/17/1993  Vaping Use   Vaping status: Never Used  Substance and Sexual Activity   Alcohol use: No   Drug use: No   Sexual activity: Not on file  Other Topics Concern   Not on file  Social History Narrative   Not on file   Social Drivers of Health   Financial Resource Strain: Low Risk  (03/15/2024)   Overall Financial Resource Strain (CARDIA)    Difficulty of Paying Living Expenses: Not hard at all  Food Insecurity: No Food  Insecurity (03/15/2024)   Hunger Vital Sign    Worried About Running Out of Food in the Last Year: Never true    Ran Out of Food in the Last Year: Never true  Transportation Needs: No Transportation Needs (03/15/2024)   PRAPARE - Administrator, Civil Service (Medical): No    Lack of Transportation (Non-Medical): No  Physical Activity: Insufficiently Active (03/15/2024)   Exercise Vital Sign    Days of Exercise per Week: 3 days    Minutes of Exercise per Session: 30 min  Stress: No Stress Concern Present (03/15/2024)   [employer] of Occupational Health - Occupational Stress Questionnaire    Feeling of Stress : Not at all  Social Connections: Moderately Isolated (03/15/2024)   Social Connection and Isolation Panel [NHANES]    Frequency of Communication with Friends and  Family: Three times a week    Frequency of Social Gatherings with Friends and Family: Never    Attends Religious Services: Never    Database administrator or Organizations: No    Attends Engineer, structural: Never    Marital Status: Married    Tobacco Counseling Counseling given: Not Answered    Clinical Intake:  Pre-visit preparation completed: Yes  Pain : No/denies pain     BMI - recorded: 26.1 Nutritional Status: BMI 25 -29 Overweight Nutritional Risks: None Diabetes: No  Lab Results  Component Value Date   HGBA1C 6.5 (A) 02/27/2024   HGBA1C 6.8 (H) 08/24/2023   HGBA1C 6.5 (A) 02/21/2023     How often do you need to have someone help you when you read instructions, pamphlets, or other written materials from your doctor or pharmacy?: 1 - Never  Interpreter Needed?: No  Information entered by :: Dellie Fergusson, LPN   Activities of Daily Living     03/15/2024   11:40 AM 08/30/2023    2:50 PM  In your present state of health, do you have any difficulty performing the following activities:  Hearing? 0 0  Vision? 0 0  Difficulty concentrating or making decisions? 0 0   Walking or climbing stairs? 1 0  Comment KNEE PAIN   Dressing or bathing? 0 0  Doing errands, shopping? 0 0  Preparing Food and eating ? N   Using the Toilet? N   In the past six months, have you accidently leaked urine? N   Do you have problems with loss of bowel control? N   Managing your Medications? N   Managing your Finances? N   Housekeeping or managing your Housekeeping? N     Patient Care Team: Raina Bunting, DO as PCP - General (Family Medicine) Jerelene Monday Deadra Everts, MD as PCP - Cardiology (Cardiology) Devorah Fonder, MD as Consulting Physician (Cardiology) Pa, [facility] (Optometry)  Indicate any recent Medical Services you may have received from other than [facility] providers in the past year (date may be approximate).     Assessment:    This is a routine wellness examination for Barry [name].  Hearing/Vision screen Hearing Screening - Comments:: NO AIDS Vision Screening - Comments:: READERS- DR.PORFILIO   Goals Addressed             This Visit's Progress    Cut out extra servings         Depression Screen     03/15/2024   11:36 AM 02/27/2024    3:33 PM 08/30/2023    2:49 PM 03/10/2023    1:11 PM 02/21/2023   11:07 AM 12/05/2022    3:12 PM 08/23/2022   11:12 AM  PHQ 2/9 Scores  PHQ - 2 Score 0 1 1 0 1 0 0  PHQ- 9 Score 0 4  0  2 3    Fall Risk     03/15/2024   11:40 AM 02/27/2024    3:32 PM 08/30/2023    2:49 PM 03/10/2023    1:13 PM 02/21/2023   11:08 AM  Fall Risk   Falls in the past year? 0 1 1 1 1   Number falls in past yr: 0 0 0 0 0  Injury with Fall? 0 0 0 0 0  Risk for fall due to : No Fall Risks   History of fall(s)   Follow up Falls evaluation completed   Falls prevention discussed;Falls evaluation completed  MEDICARE RISK AT HOME:  Medicare Risk at Home Any stairs in or around the home?: Yes If so, are there any without handrails?: No Home free of loose throw rugs in walkways, pet beds, electrical cords, etc?: Yes Adequate  lighting in your home to reduce risk of falls?: Yes Life alert?: No Use of a cane, walker or w/c?: No Grab bars in the bathroom?: Yes Shower chair or bench in shower?: Yes Elevated toilet seat or a handicapped toilet?: Yes  TIMED UP AND GO:  Was the test performed?  No  Cognitive Function: 6CIT completed    05/19/2015   10:06 AM  MMSE - Mini Mental State Exam  Orientation to time 5  Orientation to Place 5  Registration 3  Attention/ Calculation 5  Recall 3  Language- name 2 objects 2  Language- repeat 1  Language- follow 3 step command 3  Language- read & follow direction 1  Write a sentence 1  Copy design 1  Total score 30        03/15/2024   11:41 AM 03/10/2023    1:18 PM 09/08/2020   10:24 AM 08/14/2018    9:57 AM 06/27/2017    9:39 AM  6CIT Screen  What Year? 0 points 0 points 0 points 0 points 0 points  What month? 0 points 0 points 0 points 0 points 0 points  What time? 0 points 0 points 0 points 0 points 0 points  Count back from 20 2 points 0 points 0 points 0 points 0 points  Months in reverse 0 points 0 points 0 points 0 points 0 points  Repeat phrase 0 points 0 points 0 points 2 points 2 points  Total Score 2 points 0 points 0 points 2 points 2 points    Immunizations Immunization History  Administered Date(s) Administered   Fluad Trivalent(High Dose 65+) 08/30/2023   Influenza, High Dose Seasonal PF 07/28/2015, 08/14/2018, 07/21/2021, 07/22/2022   Influenza,inj,quad, With Preservative 07/31/2017, 07/18/2019   Influenza-Unspecified 06/17/2010, 07/18/2011, 06/17/2014, 07/20/2017, 07/17/2020   PFIZER(Purple Top)SARS-COV-2 Vaccination 11/29/2019, 12/20/2019   PNEUMOCOCCAL CONJUGATE-20 08/30/2023   Pneumococcal Conjugate-13 05/07/2014   Pneumococcal Polysaccharide-23 10/12/2011   Pneumococcal-Unspecified 09/17/2011   Td 05/18/2011   Tdap 02/15/2008, 05/17/2008    Screening Tests Health Maintenance  Topic Date Due   Zoster Vaccines- Shingrix (1 of 2)  Never done   DTaP/Tdap/Td (4 - Td or Tdap) 05/17/2021   OPHTHALMOLOGY EXAM  02/12/2023   COVID-19 Vaccine (3 - 2024-25 season) 06/18/2023   Diabetic kidney evaluation - Urine ACR  02/21/2024   INFLUENZA VACCINE  05/17/2024   Diabetic kidney evaluation - eGFR measurement  08/23/2024   FOOT EXAM  08/29/2024   HEMOGLOBIN A1C  08/29/2024   Medicare Annual Wellness (AWV)  03/15/2025   Pneumonia Vaccine 21+ Years old  Completed   Hepatitis C Screening  Completed   HPV VACCINES  Aged Out   Meningococcal B Vaccine  Aged Out   Colonoscopy  Discontinued    Health Maintenance  Health Maintenance Due  Topic Date Due   Zoster Vaccines- Shingrix (1 of 2) Never done   DTaP/Tdap/Td (4 - Td or Tdap) 05/17/2021   OPHTHALMOLOGY EXAM  02/12/2023   COVID-19 Vaccine (3 - 2024-25 season) 06/18/2023   Diabetic kidney evaluation - Urine ACR  02/21/2024   Health Maintenance Items Addressed: NEEDS TDAP, SHINGRIX & EYE EXAM; AGED OUT OF COLONOSCOPY    Additional Screening:  Vision Screening: Recommended annual ophthalmology exams for early detection of glaucoma  and other disorders of the eye.  Dental Screening: Recommended annual dental exams for proper oral hygiene  Community Resource Referral / Chronic Care Management: CRR required this visit?  No   CCM required this visit?  No   Plan:    I have personally reviewed and noted the following in the patient's chart:   Medical and social history Use of alcohol, tobacco or illicit drugs  Current medications and supplements including opioid prescriptions. Patient is not currently taking opioid prescriptions. Functional ability and status Nutritional status Physical activity Advanced directives List of other physicians Hospitalizations, surgeries, and ER visits in previous 12 months Vitals Screenings to include cognitive, depression, and falls Referrals and appointments  In addition, I have reviewed and discussed with patient certain  preventive protocols, quality metrics, and best practice recommendations. A written personalized care plan for preventive services as well as general preventive health recommendations were provided to patient.   Pinky Bright, LPN   1/61/0960   After Visit Summary: (MyChart) Due to this being a telephonic visit, the after visit summary with patients personalized plan was offered to patient via MyChart   Notes: Nothing significant to report at this time.

## 2024-08-28 ENCOUNTER — Other Ambulatory Visit

## 2024-08-28 DIAGNOSIS — E785 Hyperlipidemia, unspecified: Secondary | ICD-10-CM | POA: Diagnosis not present

## 2024-08-28 DIAGNOSIS — N401 Enlarged prostate with lower urinary tract symptoms: Secondary | ICD-10-CM | POA: Diagnosis not present

## 2024-08-28 DIAGNOSIS — R350 Frequency of micturition: Secondary | ICD-10-CM | POA: Diagnosis not present

## 2024-08-28 DIAGNOSIS — I10 Essential (primary) hypertension: Secondary | ICD-10-CM

## 2024-08-28 DIAGNOSIS — E1169 Type 2 diabetes mellitus with other specified complication: Secondary | ICD-10-CM

## 2024-08-28 DIAGNOSIS — I25118 Atherosclerotic heart disease of native coronary artery with other forms of angina pectoris: Secondary | ICD-10-CM

## 2024-08-28 DIAGNOSIS — Z0000 Encounter for general adult medical examination without abnormal findings: Secondary | ICD-10-CM

## 2024-08-29 LAB — COMPREHENSIVE METABOLIC PANEL WITH GFR
AG Ratio: 2.5 (calc) (ref 1.0–2.5)
ALT: 14 U/L (ref 9–46)
AST: 18 U/L (ref 10–35)
Albumin: 4.3 g/dL (ref 3.6–5.1)
Alkaline phosphatase (APISO): 74 U/L (ref 35–144)
BUN: 17 mg/dL (ref 7–25)
CO2: 25 mmol/L (ref 20–32)
Calcium: 8.7 mg/dL (ref 8.6–10.3)
Chloride: 108 mmol/L (ref 98–110)
Creat: 0.83 mg/dL (ref 0.70–1.28)
Globulin: 1.7 g/dL — ABNORMAL LOW (ref 1.9–3.7)
Glucose, Bld: 136 mg/dL — ABNORMAL HIGH (ref 65–99)
Potassium: 4 mmol/L (ref 3.5–5.3)
Sodium: 141 mmol/L (ref 135–146)
Total Bilirubin: 0.8 mg/dL (ref 0.2–1.2)
Total Protein: 6 g/dL — ABNORMAL LOW (ref 6.1–8.1)
eGFR: 90 mL/min/1.73m2 (ref >=60)

## 2024-08-29 LAB — CBC WITH DIFFERENTIAL/PLATELET
Absolute Lymphocytes: 1427 {cells}/uL (ref 850–3900)
Absolute Monocytes: 464 {cells}/uL (ref 200–950)
Basophils Absolute: 43 {cells}/uL (ref 0–200)
Basophils Relative: 0.7 %
Eosinophils Absolute: 128 {cells}/uL (ref 15–500)
Eosinophils Relative: 2.1 %
HCT: 38.9 % (ref 38.5–50.0)
Hemoglobin: 13.1 g/dL — ABNORMAL LOW (ref 13.2–17.1)
MCH: 31.2 pg (ref 27.0–33.0)
MCHC: 33.7 g/dL (ref 32.0–36.0)
MCV: 92.6 fL (ref 80.0–100.0)
MPV: 10.4 fL (ref 7.5–12.5)
Monocytes Relative: 7.6 %
Neutro Abs: 4038 {cells}/uL (ref 1500–7800)
Neutrophils Relative %: 66.2 %
Platelets: 176 Thousand/uL (ref 140–400)
RBC: 4.2 Million/uL (ref 4.20–5.80)
RDW: 12.1 % (ref 11.0–15.0)
Total Lymphocyte: 23.4 %
WBC: 6.1 Thousand/uL (ref 3.8–10.8)

## 2024-08-29 LAB — MICROALBUMIN / CREATININE URINE RATIO
Creatinine, Urine: 112 mg/dL (ref 20–320)
Microalb Creat Ratio: 9 mg/g{creat} (ref <30)
Microalb, Ur: 1 mg/dL

## 2024-08-29 LAB — LIPID PANEL
Cholesterol: 102 mg/dL (ref <200)
HDL: 41 mg/dL (ref >=40)
LDL Cholesterol (Calc): 47 mg/dL
Non-HDL Cholesterol (Calc): 61 mg/dL (ref <130)
Total CHOL/HDL Ratio: 2.5 (calc) (ref <5.0)
Triglycerides: 48 mg/dL (ref <150)

## 2024-08-29 LAB — HEMOGLOBIN A1C
Hgb A1c MFr Bld: 6.8 % — ABNORMAL HIGH (ref <5.7)
Mean Plasma Glucose: 148 mg/dL
eAG (mmol/L): 8.2 mmol/L

## 2024-08-29 LAB — PSA: PSA: 0.44 ng/mL (ref <=4.00)

## 2024-08-29 LAB — TSH: TSH: 1.18 m[IU]/L (ref 0.40–4.50)

## 2024-08-30 ENCOUNTER — Other Ambulatory Visit

## 2024-09-03 ENCOUNTER — Telehealth: Payer: Self-pay

## 2024-09-03 NOTE — Telephone Encounter (Signed)
 This is fine with me, but it is up to his insurance for coverage. But medically that is fine to see him in January and use these labs.  Marsa Officer, DO [facility] 09/03/2024, 12:56 PM

## 2024-09-03 NOTE — Telephone Encounter (Signed)
 Patient notified we will see him in January

## 2024-09-03 NOTE — Telephone Encounter (Signed)
 Copied from CRM #[id number]. Topic: Appointments - Appointment Info/Confirmation >> Sep 03, 2024  8:24 AM Barry [name] wrote: Patient/patient representative is calling for information regarding an appointment.   Patient called to reschedule his physical appointment to 1/13 and he want to know if his lab work is still going to be good because he just had it done

## 2024-09-06 ENCOUNTER — Encounter: Admitting: Family Medicine

## 2024-09-27 ENCOUNTER — Other Ambulatory Visit: Payer: Self-pay | Admitting: Family Medicine

## 2024-09-27 DIAGNOSIS — N401 Enlarged prostate with lower urinary tract symptoms: Secondary | ICD-10-CM

## 2024-09-30 NOTE — Telephone Encounter (Signed)
 Requested Prescriptions  Pending Prescriptions Disp Refills   tamsulosin  (FLOMAX ) 0.4 MG CAPS capsule [Pharmacy Med Name: TAMSULOSIN  HCL 0.4 MG CAPSULE] 90 capsule 1    Sig: Take 1 capsule (0.4 mg total) by mouth daily.     Urology: Alpha-Adrenergic Blocker Passed - 09/30/2024  3:13 PM      Passed - PSA in normal range and within 360 days    PSA  Date Value Ref Range Status  08/28/2024 0.44 < OR = 4.00 ng/mL Final    Comment:    The total PSA value from this assay system is  standardized against the WHO standard. The test  result will be approximately 20% lower when compared  to the equimolar-standardized total PSA (Beckman  Coulter). Comparison of serial PSA results should be  interpreted with this fact in mind. . This test was performed using the Siemens  chemiluminescent method. Values obtained from  different assay methods cannot be used interchangeably. PSA levels, regardless of value, should not be interpreted as absolute evidence of the presence or absence of disease.          Passed - Last BP in normal range    BP Readings from Last 1 Encounters:  02/29/24 131/70         Passed - Valid encounter within last 12 months    Recent Outpatient Visits           7 months ago Type 2 diabetes mellitus with other specified complication, without long-term current use of insulin  [facility])   [place] [place], Marsa PARAS, DO       Future Appointments             In 5 months Stoioff, Glendia BROCKS, MD [facility]

## 2024-10-29 ENCOUNTER — Encounter: Admitting: Family Medicine

## 2024-11-18 ENCOUNTER — Encounter: Admitting: Family Medicine

## 2024-11-19 ENCOUNTER — Other Ambulatory Visit: Payer: Self-pay | Admitting: Cardiovascular Disease

## 2025-01-03 ENCOUNTER — Ambulatory Visit: Admitting: Cardiovascular Disease

## 2025-02-28 ENCOUNTER — Ambulatory Visit: Admitting: Urology

## 2025-03-05 ENCOUNTER — Ambulatory Visit: Admitting: Urology

## 2025-03-21 ENCOUNTER — Ambulatory Visit

## 2025-03-26 ENCOUNTER — Ambulatory Visit
